# Patient Record
Sex: Female | Born: 1945 | Race: Asian | Hispanic: No | Smoking: Never smoker
Health system: Southern US, Community
[De-identification: ages and names within clinical notes are randomized; demographics above are authoritative.]

## PROBLEM LIST (undated history)

## (undated) DIAGNOSIS — I10 Essential (primary) hypertension: Secondary | ICD-10-CM

## (undated) DIAGNOSIS — E119 Type 2 diabetes mellitus without complications: Secondary | ICD-10-CM

## (undated) DIAGNOSIS — I2510 Atherosclerotic heart disease of native coronary artery without angina pectoris: Secondary | ICD-10-CM

## (undated) HISTORY — PX: CORONARY ANGIOPLASTY WITH STENT PLACEMENT: SHX49

---

## 2019-02-03 ENCOUNTER — Encounter (HOSPITAL_BASED_OUTPATIENT_CLINIC_OR_DEPARTMENT_OTHER): Payer: Self-pay | Admitting: Emergency Medicine

## 2019-02-03 ENCOUNTER — Other Ambulatory Visit: Payer: Self-pay

## 2019-02-03 ENCOUNTER — Inpatient Hospital Stay (HOSPITAL_BASED_OUTPATIENT_CLINIC_OR_DEPARTMENT_OTHER)
Admission: EM | Admit: 2019-02-03 | Discharge: 2019-03-27 | DRG: 004 | Disposition: A | Discharge status: Rehabilitation Facility | Payer: Medicaid Other | Attending: Family Medicine | Admitting: Family Medicine

## 2019-02-03 DIAGNOSIS — Z7902 Long term (current) use of antithrombotics/antiplatelets: Secondary | ICD-10-CM

## 2019-02-03 DIAGNOSIS — R0603 Acute respiratory distress: Secondary | ICD-10-CM

## 2019-02-03 DIAGNOSIS — Z01818 Encounter for other preprocedural examination: Secondary | ICD-10-CM

## 2019-02-03 DIAGNOSIS — Z7189 Other specified counseling: Secondary | ICD-10-CM

## 2019-02-03 DIAGNOSIS — I959 Hypotension, unspecified: Secondary | ICD-10-CM | POA: Diagnosis present

## 2019-02-03 DIAGNOSIS — D6489 Other specified anemias: Secondary | ICD-10-CM | POA: Diagnosis present

## 2019-02-03 DIAGNOSIS — R140 Abdominal distension (gaseous): Secondary | ICD-10-CM

## 2019-02-03 DIAGNOSIS — R0600 Dyspnea, unspecified: Secondary | ICD-10-CM

## 2019-02-03 DIAGNOSIS — G9341 Metabolic encephalopathy: Secondary | ICD-10-CM | POA: Diagnosis present

## 2019-02-03 DIAGNOSIS — E11649 Type 2 diabetes mellitus with hypoglycemia without coma: Secondary | ICD-10-CM | POA: Diagnosis not present

## 2019-02-03 DIAGNOSIS — I252 Old myocardial infarction: Secondary | ICD-10-CM

## 2019-02-03 DIAGNOSIS — J189 Pneumonia, unspecified organism: Secondary | ICD-10-CM

## 2019-02-03 DIAGNOSIS — E875 Hyperkalemia: Secondary | ICD-10-CM | POA: Diagnosis present

## 2019-02-03 DIAGNOSIS — I255 Ischemic cardiomyopathy: Secondary | ICD-10-CM | POA: Diagnosis present

## 2019-02-03 DIAGNOSIS — Y838 Other surgical procedures as the cause of abnormal reaction of the patient, or of later complication, without mention of misadventure at the time of the procedure: Secondary | ICD-10-CM | POA: Diagnosis not present

## 2019-02-03 DIAGNOSIS — Z87898 Personal history of other specified conditions: Secondary | ICD-10-CM

## 2019-02-03 DIAGNOSIS — Z9889 Other specified postprocedural states: Secondary | ICD-10-CM

## 2019-02-03 DIAGNOSIS — R0602 Shortness of breath: Secondary | ICD-10-CM

## 2019-02-03 DIAGNOSIS — E876 Hypokalemia: Secondary | ICD-10-CM | POA: Diagnosis not present

## 2019-02-03 DIAGNOSIS — A400 Sepsis due to streptococcus, group A: Principal | ICD-10-CM | POA: Diagnosis present

## 2019-02-03 DIAGNOSIS — N179 Acute kidney failure, unspecified: Secondary | ICD-10-CM

## 2019-02-03 DIAGNOSIS — J154 Pneumonia due to other streptococci: Secondary | ICD-10-CM | POA: Diagnosis present

## 2019-02-03 DIAGNOSIS — T17908A Unspecified foreign body in respiratory tract, part unspecified causing other injury, initial encounter: Secondary | ICD-10-CM

## 2019-02-03 DIAGNOSIS — J9690 Respiratory failure, unspecified, unspecified whether with hypoxia or hypercapnia: Secondary | ICD-10-CM

## 2019-02-03 DIAGNOSIS — I480 Paroxysmal atrial fibrillation: Secondary | ICD-10-CM | POA: Diagnosis not present

## 2019-02-03 DIAGNOSIS — Z515 Encounter for palliative care: Secondary | ICD-10-CM

## 2019-02-03 DIAGNOSIS — R6510 Systemic inflammatory response syndrome (SIRS) of non-infectious origin without acute organ dysfunction: Secondary | ICD-10-CM

## 2019-02-03 DIAGNOSIS — Z930 Tracheostomy status: Secondary | ICD-10-CM

## 2019-02-03 DIAGNOSIS — Z9289 Personal history of other medical treatment: Secondary | ICD-10-CM

## 2019-02-03 DIAGNOSIS — I214 Non-ST elevation (NSTEMI) myocardial infarction: Secondary | ICD-10-CM | POA: Diagnosis not present

## 2019-02-03 DIAGNOSIS — K567 Ileus, unspecified: Secondary | ICD-10-CM

## 2019-02-03 DIAGNOSIS — R079 Chest pain, unspecified: Secondary | ICD-10-CM

## 2019-02-03 DIAGNOSIS — E1165 Type 2 diabetes mellitus with hyperglycemia: Secondary | ICD-10-CM | POA: Diagnosis present

## 2019-02-03 DIAGNOSIS — J9601 Acute respiratory failure with hypoxia: Secondary | ICD-10-CM

## 2019-02-03 DIAGNOSIS — J811 Chronic pulmonary edema: Secondary | ICD-10-CM

## 2019-02-03 DIAGNOSIS — R6521 Severe sepsis with septic shock: Secondary | ICD-10-CM | POA: Diagnosis present

## 2019-02-03 DIAGNOSIS — Z7982 Long term (current) use of aspirin: Secondary | ICD-10-CM

## 2019-02-03 DIAGNOSIS — Z79899 Other long term (current) drug therapy: Secondary | ICD-10-CM

## 2019-02-03 DIAGNOSIS — Y95 Nosocomial condition: Secondary | ICD-10-CM | POA: Diagnosis present

## 2019-02-03 DIAGNOSIS — E1122 Type 2 diabetes mellitus with diabetic chronic kidney disease: Secondary | ICD-10-CM | POA: Diagnosis present

## 2019-02-03 DIAGNOSIS — J810 Acute pulmonary edema: Secondary | ICD-10-CM

## 2019-02-03 DIAGNOSIS — N183 Chronic kidney disease, stage 3 (moderate): Secondary | ICD-10-CM | POA: Diagnosis present

## 2019-02-03 DIAGNOSIS — R109 Unspecified abdominal pain: Secondary | ICD-10-CM

## 2019-02-03 DIAGNOSIS — F411 Generalized anxiety disorder: Secondary | ICD-10-CM | POA: Diagnosis present

## 2019-02-03 DIAGNOSIS — Z20828 Contact with and (suspected) exposure to other viral communicable diseases: Secondary | ICD-10-CM | POA: Diagnosis present

## 2019-02-03 DIAGNOSIS — I2510 Atherosclerotic heart disease of native coronary artery without angina pectoris: Secondary | ICD-10-CM

## 2019-02-03 DIAGNOSIS — Z978 Presence of other specified devices: Secondary | ICD-10-CM

## 2019-02-03 DIAGNOSIS — Z09 Encounter for follow-up examination after completed treatment for conditions other than malignant neoplasm: Secondary | ICD-10-CM

## 2019-02-03 DIAGNOSIS — J9621 Acute and chronic respiratory failure with hypoxia: Secondary | ICD-10-CM

## 2019-02-03 DIAGNOSIS — K922 Gastrointestinal hemorrhage, unspecified: Secondary | ICD-10-CM | POA: Diagnosis not present

## 2019-02-03 DIAGNOSIS — I130 Hypertensive heart and chronic kidney disease with heart failure and stage 1 through stage 4 chronic kidney disease, or unspecified chronic kidney disease: Secondary | ICD-10-CM | POA: Diagnosis present

## 2019-02-03 DIAGNOSIS — D638 Anemia in other chronic diseases classified elsewhere: Secondary | ICD-10-CM | POA: Diagnosis present

## 2019-02-03 DIAGNOSIS — A419 Sepsis, unspecified organism: Secondary | ICD-10-CM

## 2019-02-03 DIAGNOSIS — J9600 Acute respiratory failure, unspecified whether with hypoxia or hypercapnia: Secondary | ICD-10-CM

## 2019-02-03 DIAGNOSIS — Z781 Physical restraint status: Secondary | ICD-10-CM

## 2019-02-03 DIAGNOSIS — G4700 Insomnia, unspecified: Secondary | ICD-10-CM | POA: Diagnosis present

## 2019-02-03 DIAGNOSIS — Z955 Presence of coronary angioplasty implant and graft: Secondary | ICD-10-CM

## 2019-02-03 DIAGNOSIS — Z794 Long term (current) use of insulin: Secondary | ICD-10-CM

## 2019-02-03 DIAGNOSIS — Z4659 Encounter for fitting and adjustment of other gastrointestinal appliance and device: Secondary | ICD-10-CM

## 2019-02-03 DIAGNOSIS — R4702 Dysphasia: Secondary | ICD-10-CM | POA: Diagnosis not present

## 2019-02-03 DIAGNOSIS — T82838A Hemorrhage of vascular prosthetic devices, implants and grafts, initial encounter: Secondary | ICD-10-CM | POA: Diagnosis not present

## 2019-02-03 DIAGNOSIS — E870 Hyperosmolality and hypernatremia: Secondary | ICD-10-CM | POA: Diagnosis not present

## 2019-02-03 DIAGNOSIS — E873 Alkalosis: Secondary | ICD-10-CM | POA: Diagnosis not present

## 2019-02-03 DIAGNOSIS — B349 Viral infection, unspecified: Secondary | ICD-10-CM | POA: Diagnosis present

## 2019-02-03 DIAGNOSIS — I5043 Acute on chronic combined systolic (congestive) and diastolic (congestive) heart failure: Secondary | ICD-10-CM

## 2019-02-03 DIAGNOSIS — E785 Hyperlipidemia, unspecified: Secondary | ICD-10-CM | POA: Diagnosis present

## 2019-02-03 DIAGNOSIS — D62 Acute posthemorrhagic anemia: Secondary | ICD-10-CM | POA: Diagnosis not present

## 2019-02-03 DIAGNOSIS — Z0189 Encounter for other specified special examinations: Secondary | ICD-10-CM

## 2019-02-03 HISTORY — DX: Essential (primary) hypertension: I10

## 2019-02-03 HISTORY — DX: Atherosclerotic heart disease of native coronary artery without angina pectoris: I25.10

## 2019-02-03 HISTORY — DX: Type 2 diabetes mellitus without complications: E11.9

## 2019-02-03 MED ORDER — ONDANSETRON HCL 4 MG/2ML IJ SOLN
4.0000 mg | Freq: Once | INTRAMUSCULAR | Status: AC
  Administered 2019-02-04: 4 mg via INTRAVENOUS
  Filled 2019-02-03: qty 2

## 2019-02-03 MED ORDER — SODIUM CHLORIDE 0.9 % IV BOLUS
1000.0000 mL | Freq: Once | INTRAVENOUS | Status: AC
  Administered 2019-02-03: 1000 mL via INTRAVENOUS

## 2019-02-03 MED ORDER — ACETAMINOPHEN 325 MG PO TABS
650.0000 mg | ORAL_TABLET | Freq: Once | ORAL | Status: AC
  Administered 2019-02-03: 650 mg via ORAL
  Filled 2019-02-03: qty 2

## 2019-02-03 NOTE — ED Provider Notes (Signed)
[facility]-EMERGENCY DEPT [facility] Provider Note: Lowella Dell, MD, FACEP  CSN: [id number] MRN: [id number] ARRIVAL: 02/03/19 at 2252 ROOM: MHOTF/OTF   CHIEF COMPLAINT  Fever   HISTORY OF PRESENT ILLNESS  02/03/19 11:14 PM Gabrielle Wright is a 73 y.o. female visiting here from [place].  Today she developed fever, body aches, low back pain and confusion (such as talking about her deceased husband as if he were present).  She was seen by a physician this afternoon who diagnosed her with a urinary tract infection but she was unable to get her prescription filled.  She denies abdominal pain, flank pain or dysuria.  Fever was noted to be 101.9 on arrival.  She did take Tylenol earlier for her symptoms.  Her son-in-law is interpreting for her.   Past Medical History:  Diagnosis Date  . Coronary artery disease   . Diabetes mellitus without complication (HCC)   . Hypertension     Past Surgical History:  Procedure Laterality Date  . CORONARY ANGIOPLASTY WITH STENT PLACEMENT      No family history on file.  Social History   Tobacco Use  . Smoking status: Never Smoker  . Smokeless tobacco: Never Used  Substance Use Topics  . Alcohol use: Never    Frequency: Never  . Drug use: Never    Prior to Admission medications   Not on File    Allergies Patient has no known allergies.   REVIEW OF SYSTEMS  Negative except as noted here or in the History of Present Illness.   PHYSICAL EXAMINATION  Initial Vital Signs Blood pressure (!) 128/50, pulse (!) 102, temperature (!) 101.9 F (38.8 C), temperature source Oral, resp. rate (!) 22, height 5\' 4"  (1.626 m), SpO2 96 %.  Examination General: Well-developed, well-nourished female in no acute distress; appearance consistent with age of record HENT: normocephalic; atraumatic Eyes: pupils equal, round and reactive to light; extraocular muscles intact Neck: supple; no meningeal signs Heart: regular rate and rhythm; harsh systolic murmur at  left upper sternal border Lungs: clear to auscultation bilaterally Abdomen: soft; nondistended; nontender; bowel sounds present GU: No CVA tenderness Extremities: No deformity; full range of motion; pulses normal Neurologic: Awake, alert; motor function intact in all extremities and symmetric; no facial droop Skin: Warm and dry Psychiatric: Flat affect   RESULTS  Summary of this visit's results, reviewed by myself:   EKG Interpretation  Date/Time:  Tuesday February 04 2019 04:49:55 EDT Ventricular Rate:  87 PR Interval:    QRS Duration: 74 QT Interval:  376 QTC Calculation: 453 R Axis:   38 Text Interpretation:  Sinus rhythm Probable left atrial enlargement Anterior infarct, old No previous ECGs available Confirmed by Paula Libra ([id number]) on 02/04/2019 4:55:54 AM      Laboratory Studies: Results for orders placed or performed during the hospital encounter of 02/03/19 (from the past 24 hour(s))  Comprehensive metabolic panel     Status: Abnormal   Collection Time: 02/03/19 11:54 PM  Result Value Ref Range   Sodium 131 (L) 135 - 145 mmol/L   Potassium 5.2 (H) 3.5 - 5.1 mmol/L   Chloride 100 98 - 111 mmol/L   CO2 22 22 - 32 mmol/L   Glucose, Bld 223 (H) 70 - 99 mg/dL   BUN 33 (H) 8 - 23 mg/dL   Creatinine, Ser 6.21 (H) 0.44 - 1.00 mg/dL   Calcium 9.1 8.9 - 30.8 mg/dL   Total Protein 7.6 6.5 - 8.1 g/dL   Albumin 4.0 3.5 -  5.0 g/dL   AST 29 15 - 41 U/L   ALT 16 0 - 44 U/L   Alkaline Phosphatase 66 38 - 126 U/L   Total Bilirubin 0.5 0.3 - 1.2 mg/dL   GFR calc non Af Amer 31 (L) >60 mL/min   GFR calc Af Amer 36 (L) >60 mL/min   Anion gap 9 5 - 15  Lactic acid, plasma     Status: None   Collection Time: 02/03/19 11:54 PM  Result Value Ref Range   Lactic Acid, Venous 1.9 0.5 - 1.9 mmol/L  CBC with Differential     Status: Abnormal   Collection Time: 02/03/19 11:54 PM  Result Value Ref Range   WBC 10.3 4.0 - 10.5 K/uL   RBC 4.80 3.87 - 5.11 MIL/uL   Hemoglobin 11.6 (L) 12.0  - 15.0 g/dL   HCT 52.8 41.3 - 24.4 %   MCV 81.0 80.0 - 100.0 fL   MCH 24.2 (L) 26.0 - 34.0 pg   MCHC 29.8 (L) 30.0 - 36.0 g/dL   RDW 01.0 (H) 27.2 - 53.6 %   Platelets 187 150 - 400 K/uL   nRBC 0.0 0.0 - 0.2 %   Neutrophils Relative % 92 %   Neutro Abs 9.5 (H) 1.7 - 7.7 K/uL   Lymphocytes Relative 6 %   Lymphs Abs 0.6 (L) 0.7 - 4.0 K/uL   Monocytes Relative 1 %   Monocytes Absolute 0.1 0.1 - 1.0 K/uL   Eosinophils Relative 1 %   Eosinophils Absolute 0.1 0.0 - 0.5 K/uL   Basophils Relative 0 %   Basophils Absolute 0.0 0.0 - 0.1 K/uL   Immature Granulocytes 0 %   Abs Immature Granulocytes 0.03 0.00 - 0.07 K/uL  Urinalysis, Routine w reflex microscopic     Status: None   Collection Time: 02/04/19  1:23 AM  Result Value Ref Range   Color, Urine YELLOW YELLOW   APPearance CLEAR CLEAR   Specific Gravity, Urine 1.015 1.005 - 1.030   pH 5.5 5.0 - 8.0   Glucose, UA NEGATIVE NEGATIVE mg/dL   Hgb urine dipstick NEGATIVE NEGATIVE   Bilirubin Urine NEGATIVE NEGATIVE   Ketones, ur NEGATIVE NEGATIVE mg/dL   Protein, ur NEGATIVE NEGATIVE mg/dL   Nitrite NEGATIVE NEGATIVE   Leukocytes,Ua NEGATIVE NEGATIVE  Lactic acid, plasma     Status: None   Collection Time: 02/04/19  2:54 AM  Result Value Ref Range   Lactic Acid, Venous 1.7 0.5 - 1.9 mmol/L  ` Imaging Studies: Dg Chest 2 View  Result Date: 02/04/2019 CLINICAL DATA:  Mid chest pain today.  Fever and confusion. EXAM: CHEST - 2 VIEW COMPARISON:  01/31/2019 FINDINGS: The heart size and mediastinal contours are within normal limits. Both lungs are clear. The visualized skeletal structures are unremarkable. Calcification of the aorta. IMPRESSION: No active cardiopulmonary disease. Electronically Signed   By: Burman Nieves M.D.   On: 02/04/2019 02:26    ED COURSE and MDM  Nursing notes and initial vitals signs, including pulse oximetry, reviewed.  Vitals:   02/04/19 0520 02/04/19 0525 02/04/19 0530 02/04/19 0535  BP: (!) 88/52 (!)  93/57 (!) 99/56 101/62  Pulse: 84 84 85 88  Resp: (!) 27 (!) 25 (!) 21 (!) 21  Temp:      TempSrc:      SpO2: 100% 100% 99% 100%  Weight:      Height:       1:53 AM Patient finally provided a urine specimen.  There  is no evidence of urinary tract infection.  Her low lymphocyte count suggest a viral etiology.  Chest x-ray has been ordered although the patient has had no respiratory complaints.  3:02 AM Patient's blood pressure has become soft, third liter of normal saline fluid bolus ordered.  We will start antibiotics (Zosyn and vancomycin) for presumed sepsis without an obvious source.   4:17 AM SBP in 70s and 80s.  Levophed infusion initiated.  4:56 AM Dr. Dellie Catholic accepts for transfer to [facility], ICU critical care service.  PROCEDURES   CRITICAL CARE Performed by: Carlisle Beers Jadon Ressler Total critical care time: 75 minutes Critical care time was exclusive of separately billable procedures and treating other patients. Critical care was necessary to treat or prevent imminent or life-threatening deterioration. Critical care was time spent personally by me on the following activities: development of treatment plan with patient and/or surrogate as well as nursing, discussions with consultants, evaluation of patient's response to treatment, examination of patient, obtaining history from patient or surrogate, ordering and performing treatments and interventions, ordering and review of laboratory studies, ordering and review of radiographic studies, pulse oximetry and re-evaluation of patient's condition.   ED DIAGNOSES     ICD-10-CM   1. SIRS (systemic inflammatory response syndrome) (HCC) R65.10   2. AKI (acute kidney injury) (HCC) N17.9   3. Septic shock (HCC) A41.9    R65.21        Paula Libra, MD 02/04/19 [phone]

## 2019-02-03 NOTE — ED Triage Notes (Signed)
Pt brought to ED by [facility] from home for a c/o UTI, fever and some confusion, pt was diagnose with a UTI today by PCP unable to get abx. BP 160/58, HR 100 CBG 181.

## 2019-02-04 ENCOUNTER — Inpatient Hospital Stay (HOSPITAL_COMMUNITY): Payer: Medicaid Other

## 2019-02-04 ENCOUNTER — Emergency Department (HOSPITAL_BASED_OUTPATIENT_CLINIC_OR_DEPARTMENT_OTHER): Payer: Medicaid Other

## 2019-02-04 DIAGNOSIS — I480 Paroxysmal atrial fibrillation: Secondary | ICD-10-CM | POA: Diagnosis not present

## 2019-02-04 DIAGNOSIS — J9621 Acute and chronic respiratory failure with hypoxia: Secondary | ICD-10-CM | POA: Diagnosis present

## 2019-02-04 DIAGNOSIS — A419 Sepsis, unspecified organism: Secondary | ICD-10-CM | POA: Diagnosis present

## 2019-02-04 DIAGNOSIS — Z930 Tracheostomy status: Secondary | ICD-10-CM | POA: Diagnosis not present

## 2019-02-04 DIAGNOSIS — G9341 Metabolic encephalopathy: Secondary | ICD-10-CM | POA: Diagnosis present

## 2019-02-04 DIAGNOSIS — R1013 Epigastric pain: Secondary | ICD-10-CM | POA: Diagnosis not present

## 2019-02-04 DIAGNOSIS — E870 Hyperosmolality and hypernatremia: Secondary | ICD-10-CM | POA: Diagnosis not present

## 2019-02-04 DIAGNOSIS — I255 Ischemic cardiomyopathy: Secondary | ICD-10-CM | POA: Diagnosis not present

## 2019-02-04 DIAGNOSIS — J810 Acute pulmonary edema: Secondary | ICD-10-CM | POA: Diagnosis not present

## 2019-02-04 DIAGNOSIS — I959 Hypotension, unspecified: Secondary | ICD-10-CM

## 2019-02-04 DIAGNOSIS — I130 Hypertensive heart and chronic kidney disease with heart failure and stage 1 through stage 4 chronic kidney disease, or unspecified chronic kidney disease: Secondary | ICD-10-CM | POA: Diagnosis not present

## 2019-02-04 DIAGNOSIS — R579 Shock, unspecified: Secondary | ICD-10-CM | POA: Diagnosis not present

## 2019-02-04 DIAGNOSIS — F411 Generalized anxiety disorder: Secondary | ICD-10-CM | POA: Diagnosis not present

## 2019-02-04 DIAGNOSIS — Z9911 Dependence on respirator [ventilator] status: Secondary | ICD-10-CM | POA: Diagnosis not present

## 2019-02-04 DIAGNOSIS — I5042 Chronic combined systolic (congestive) and diastolic (congestive) heart failure: Secondary | ICD-10-CM | POA: Diagnosis not present

## 2019-02-04 DIAGNOSIS — I5043 Acute on chronic combined systolic (congestive) and diastolic (congestive) heart failure: Secondary | ICD-10-CM | POA: Diagnosis not present

## 2019-02-04 DIAGNOSIS — D649 Anemia, unspecified: Secondary | ICD-10-CM | POA: Diagnosis not present

## 2019-02-04 DIAGNOSIS — I351 Nonrheumatic aortic (valve) insufficiency: Secondary | ICD-10-CM | POA: Diagnosis not present

## 2019-02-04 DIAGNOSIS — G9340 Encephalopathy, unspecified: Secondary | ICD-10-CM | POA: Diagnosis not present

## 2019-02-04 DIAGNOSIS — K567 Ileus, unspecified: Secondary | ICD-10-CM | POA: Diagnosis not present

## 2019-02-04 DIAGNOSIS — R1310 Dysphagia, unspecified: Secondary | ICD-10-CM | POA: Diagnosis not present

## 2019-02-04 DIAGNOSIS — R6521 Severe sepsis with septic shock: Secondary | ICD-10-CM

## 2019-02-04 DIAGNOSIS — A400 Sepsis due to streptococcus, group A: Secondary | ICD-10-CM | POA: Diagnosis present

## 2019-02-04 DIAGNOSIS — I2510 Atherosclerotic heart disease of native coronary artery without angina pectoris: Secondary | ICD-10-CM | POA: Diagnosis not present

## 2019-02-04 DIAGNOSIS — E1165 Type 2 diabetes mellitus with hyperglycemia: Secondary | ICD-10-CM | POA: Diagnosis present

## 2019-02-04 DIAGNOSIS — Z7189 Other specified counseling: Secondary | ICD-10-CM | POA: Diagnosis not present

## 2019-02-04 DIAGNOSIS — I482 Chronic atrial fibrillation, unspecified: Secondary | ICD-10-CM | POA: Diagnosis not present

## 2019-02-04 DIAGNOSIS — I5023 Acute on chronic systolic (congestive) heart failure: Secondary | ICD-10-CM | POA: Diagnosis not present

## 2019-02-04 DIAGNOSIS — Z79899 Other long term (current) drug therapy: Secondary | ICD-10-CM | POA: Diagnosis not present

## 2019-02-04 DIAGNOSIS — B950 Streptococcus, group A, as the cause of diseases classified elsewhere: Secondary | ICD-10-CM | POA: Diagnosis not present

## 2019-02-04 DIAGNOSIS — E669 Obesity, unspecified: Secondary | ICD-10-CM | POA: Diagnosis not present

## 2019-02-04 DIAGNOSIS — N179 Acute kidney failure, unspecified: Secondary | ICD-10-CM | POA: Diagnosis present

## 2019-02-04 DIAGNOSIS — T82838A Hemorrhage of vascular prosthetic devices, implants and grafts, initial encounter: Secondary | ICD-10-CM | POA: Diagnosis not present

## 2019-02-04 DIAGNOSIS — Z20828 Contact with and (suspected) exposure to other viral communicable diseases: Secondary | ICD-10-CM | POA: Diagnosis present

## 2019-02-04 DIAGNOSIS — J440 Chronic obstructive pulmonary disease with acute lower respiratory infection: Secondary | ICD-10-CM

## 2019-02-04 DIAGNOSIS — I10 Essential (primary) hypertension: Secondary | ICD-10-CM | POA: Diagnosis not present

## 2019-02-04 DIAGNOSIS — Z955 Presence of coronary angioplasty implant and graft: Secondary | ICD-10-CM | POA: Diagnosis not present

## 2019-02-04 DIAGNOSIS — R6520 Severe sepsis without septic shock: Secondary | ICD-10-CM | POA: Diagnosis not present

## 2019-02-04 DIAGNOSIS — E1169 Type 2 diabetes mellitus with other specified complication: Secondary | ICD-10-CM | POA: Diagnosis not present

## 2019-02-04 DIAGNOSIS — B348 Other viral infections of unspecified site: Secondary | ICD-10-CM | POA: Diagnosis not present

## 2019-02-04 DIAGNOSIS — J9690 Respiratory failure, unspecified, unspecified whether with hypoxia or hypercapnia: Secondary | ICD-10-CM | POA: Diagnosis not present

## 2019-02-04 DIAGNOSIS — R9431 Abnormal electrocardiogram [ECG] [EKG]: Secondary | ICD-10-CM | POA: Diagnosis not present

## 2019-02-04 DIAGNOSIS — J209 Acute bronchitis, unspecified: Secondary | ICD-10-CM

## 2019-02-04 DIAGNOSIS — I214 Non-ST elevation (NSTEMI) myocardial infarction: Secondary | ICD-10-CM | POA: Diagnosis not present

## 2019-02-04 DIAGNOSIS — R1312 Dysphagia, oropharyngeal phase: Secondary | ICD-10-CM | POA: Diagnosis not present

## 2019-02-04 DIAGNOSIS — Y838 Other surgical procedures as the cause of abnormal reaction of the patient, or of later complication, without mention of misadventure at the time of the procedure: Secondary | ICD-10-CM | POA: Diagnosis not present

## 2019-02-04 DIAGNOSIS — E1122 Type 2 diabetes mellitus with diabetic chronic kidney disease: Secondary | ICD-10-CM | POA: Diagnosis not present

## 2019-02-04 DIAGNOSIS — Z9289 Personal history of other medical treatment: Secondary | ICD-10-CM | POA: Diagnosis not present

## 2019-02-04 DIAGNOSIS — I25110 Atherosclerotic heart disease of native coronary artery with unstable angina pectoris: Secondary | ICD-10-CM | POA: Diagnosis not present

## 2019-02-04 DIAGNOSIS — J9611 Chronic respiratory failure with hypoxia: Secondary | ICD-10-CM | POA: Diagnosis not present

## 2019-02-04 DIAGNOSIS — E875 Hyperkalemia: Secondary | ICD-10-CM | POA: Diagnosis present

## 2019-02-04 DIAGNOSIS — J189 Pneumonia, unspecified organism: Secondary | ICD-10-CM | POA: Diagnosis not present

## 2019-02-04 DIAGNOSIS — J9601 Acute respiratory failure with hypoxia: Secondary | ICD-10-CM | POA: Diagnosis not present

## 2019-02-04 DIAGNOSIS — R140 Abdominal distension (gaseous): Secondary | ICD-10-CM | POA: Diagnosis not present

## 2019-02-04 DIAGNOSIS — Z781 Physical restraint status: Secondary | ICD-10-CM | POA: Diagnosis not present

## 2019-02-04 DIAGNOSIS — I4891 Unspecified atrial fibrillation: Secondary | ICD-10-CM | POA: Diagnosis not present

## 2019-02-04 DIAGNOSIS — K219 Gastro-esophageal reflux disease without esophagitis: Secondary | ICD-10-CM | POA: Diagnosis not present

## 2019-02-04 DIAGNOSIS — J154 Pneumonia due to other streptococci: Secondary | ICD-10-CM | POA: Diagnosis present

## 2019-02-04 DIAGNOSIS — R7881 Bacteremia: Secondary | ICD-10-CM | POA: Diagnosis not present

## 2019-02-04 DIAGNOSIS — D62 Acute posthemorrhagic anemia: Secondary | ICD-10-CM | POA: Diagnosis not present

## 2019-02-04 DIAGNOSIS — I25118 Atherosclerotic heart disease of native coronary artery with other forms of angina pectoris: Secondary | ICD-10-CM | POA: Diagnosis not present

## 2019-02-04 DIAGNOSIS — N183 Chronic kidney disease, stage 3 (moderate): Secondary | ICD-10-CM | POA: Diagnosis not present

## 2019-02-04 DIAGNOSIS — R5381 Other malaise: Secondary | ICD-10-CM | POA: Diagnosis not present

## 2019-02-04 DIAGNOSIS — F419 Anxiety disorder, unspecified: Secondary | ICD-10-CM | POA: Diagnosis not present

## 2019-02-04 DIAGNOSIS — Y95 Nosocomial condition: Secondary | ICD-10-CM | POA: Diagnosis present

## 2019-02-04 DIAGNOSIS — K922 Gastrointestinal hemorrhage, unspecified: Secondary | ICD-10-CM | POA: Diagnosis not present

## 2019-02-04 DIAGNOSIS — E873 Alkalosis: Secondary | ICD-10-CM | POA: Diagnosis not present

## 2019-02-04 DIAGNOSIS — Z515 Encounter for palliative care: Secondary | ICD-10-CM | POA: Diagnosis not present

## 2019-02-04 DIAGNOSIS — E785 Hyperlipidemia, unspecified: Secondary | ICD-10-CM | POA: Diagnosis not present

## 2019-02-04 DIAGNOSIS — R6510 Systemic inflammatory response syndrome (SIRS) of non-infectious origin without acute organ dysfunction: Secondary | ICD-10-CM | POA: Diagnosis present

## 2019-02-04 LAB — COMPREHENSIVE METABOLIC PANEL
ALT: 16 U/L (ref 0–44)
ALT: 17 U/L (ref 0–44)
AST: 29 U/L (ref 15–41)
AST: 33 U/L (ref 15–41)
Albumin: 2.8 g/dL — ABNORMAL LOW (ref 3.5–5.0)
Albumin: 4 g/dL (ref 3.5–5.0)
Alkaline Phosphatase: 53 U/L (ref 38–126)
Alkaline Phosphatase: 66 U/L (ref 38–126)
Anion gap: 7 (ref 5–15)
Anion gap: 9 (ref 5–15)
BUN: 29 mg/dL — ABNORMAL HIGH (ref 8–23)
BUN: 33 mg/dL — AB (ref 8–23)
CHLORIDE: 106 mmol/L (ref 98–111)
CO2: 18 mmol/L — ABNORMAL LOW (ref 22–32)
CO2: 22 mmol/L (ref 22–32)
Calcium: 7 mg/dL — ABNORMAL LOW (ref 8.9–10.3)
Calcium: 9.1 mg/dL (ref 8.9–10.3)
Chloride: 100 mmol/L (ref 98–111)
Creatinine, Ser: 1.62 mg/dL — ABNORMAL HIGH (ref 0.44–1.00)
Creatinine, Ser: 1.75 mg/dL — ABNORMAL HIGH (ref 0.44–1.00)
GFR calc Af Amer: 33 mL/min — ABNORMAL LOW (ref >60)
GFR calc Af Amer: 36 mL/min — ABNORMAL LOW (ref >60)
GFR calc non Af Amer: 29 mL/min — ABNORMAL LOW (ref >60)
GFR calc non Af Amer: 31 mL/min — ABNORMAL LOW (ref >60)
Glucose, Bld: 223 mg/dL — ABNORMAL HIGH (ref 70–99)
Glucose, Bld: 401 mg/dL — ABNORMAL HIGH (ref 70–99)
Potassium: 5.2 mmol/L — ABNORMAL HIGH (ref 3.5–5.1)
Potassium: 7.1 mmol/L (ref 3.5–5.1)
Sodium: 131 mmol/L — ABNORMAL LOW (ref 135–145)
Sodium: 131 mmol/L — ABNORMAL LOW (ref 135–145)
Total Bilirubin: 0.5 mg/dL (ref 0.3–1.2)
Total Bilirubin: 1.2 mg/dL (ref 0.3–1.2)
Total Protein: 5.8 g/dL — ABNORMAL LOW (ref 6.5–8.1)
Total Protein: 7.6 g/dL (ref 6.5–8.1)

## 2019-02-04 LAB — PROTIME-INR
INR: 1.3 — ABNORMAL HIGH (ref 0.8–1.2)
PROTHROMBIN TIME: 16.2 s — AB (ref 11.4–15.2)

## 2019-02-04 LAB — BASIC METABOLIC PANEL
Anion gap: 5 (ref 5–15)
BUN: 30 mg/dL — ABNORMAL HIGH (ref 8–23)
CO2: 18 mmol/L — ABNORMAL LOW (ref 22–32)
Calcium: 6.9 mg/dL — ABNORMAL LOW (ref 8.9–10.3)
Chloride: 110 mmol/L (ref 98–111)
Creatinine, Ser: 1.77 mg/dL — ABNORMAL HIGH (ref 0.44–1.00)
GFR calc Af Amer: 33 mL/min — ABNORMAL LOW (ref >60)
GFR calc non Af Amer: 28 mL/min — ABNORMAL LOW (ref >60)
Glucose, Bld: 429 mg/dL — ABNORMAL HIGH (ref 70–99)
Potassium: 6.8 mmol/L (ref 3.5–5.1)
SODIUM: 133 mmol/L — AB (ref 135–145)

## 2019-02-04 LAB — POCT I-STAT 7, (LYTES, BLD GAS, ICA,H+H)
Acid-base deficit: 7 mmol/L — ABNORMAL HIGH (ref 0.0–2.0)
Bicarbonate: 18.6 mmol/L — ABNORMAL LOW (ref 20.0–28.0)
Calcium, Ion: 1.01 mmol/L — ABNORMAL LOW (ref 1.15–1.40)
HCT: 33 % — ABNORMAL LOW (ref 36.0–46.0)
Hemoglobin: 11.2 g/dL — ABNORMAL LOW (ref 12.0–15.0)
O2 Saturation: 100 %
Patient temperature: 97.9
Potassium: 7 mmol/L (ref 3.5–5.1)
Sodium: 131 mmol/L — ABNORMAL LOW (ref 135–145)
TCO2: 20 mmol/L — ABNORMAL LOW (ref 22–32)
pCO2 arterial: 36.2 mmHg (ref 32.0–48.0)
pH, Arterial: 7.317 — ABNORMAL LOW (ref 7.350–7.450)
pO2, Arterial: 431 mmHg — ABNORMAL HIGH (ref 83.0–108.0)

## 2019-02-04 LAB — URINALYSIS, ROUTINE W REFLEX MICROSCOPIC
Bilirubin Urine: NEGATIVE
Glucose, UA: NEGATIVE mg/dL
Hgb urine dipstick: NEGATIVE
Ketones, ur: NEGATIVE mg/dL
Leukocytes,Ua: NEGATIVE
Nitrite: NEGATIVE
PH: 5.5 (ref 5.0–8.0)
Protein, ur: NEGATIVE mg/dL
SPECIFIC GRAVITY, URINE: 1.015 (ref 1.005–1.030)

## 2019-02-04 LAB — BLOOD CULTURE ID PANEL (REFLEXED)
Acinetobacter baumannii: NOT DETECTED
CANDIDA KRUSEI: NOT DETECTED
Candida albicans: NOT DETECTED
Candida glabrata: NOT DETECTED
Candida parapsilosis: NOT DETECTED
Candida tropicalis: NOT DETECTED
Enterobacter cloacae complex: NOT DETECTED
Enterobacteriaceae species: NOT DETECTED
Enterococcus species: NOT DETECTED
Escherichia coli: NOT DETECTED
Haemophilus influenzae: NOT DETECTED
Klebsiella oxytoca: NOT DETECTED
Klebsiella pneumoniae: NOT DETECTED
Listeria monocytogenes: NOT DETECTED
Neisseria meningitidis: NOT DETECTED
PSEUDOMONAS AERUGINOSA: NOT DETECTED
Proteus species: NOT DETECTED
STREPTOCOCCUS PNEUMONIAE: NOT DETECTED
Serratia marcescens: NOT DETECTED
Staphylococcus aureus (BCID): NOT DETECTED
Staphylococcus species: NOT DETECTED
Streptococcus agalactiae: NOT DETECTED
Streptococcus pyogenes: DETECTED — AB
Streptococcus species: DETECTED — AB

## 2019-02-04 LAB — CBC WITH DIFFERENTIAL/PLATELET
Abs Immature Granulocytes: 0.03 10*3/uL (ref 0.00–0.07)
Basophils Absolute: 0 10*3/uL (ref 0.0–0.1)
Basophils Relative: 0 %
Eosinophils Absolute: 0.1 10*3/uL (ref 0.0–0.5)
Eosinophils Relative: 1 %
HCT: 38.9 % (ref 36.0–46.0)
Hemoglobin: 11.6 g/dL — ABNORMAL LOW (ref 12.0–15.0)
Immature Granulocytes: 0 %
Lymphocytes Relative: 6 %
Lymphs Abs: 0.6 10*3/uL — ABNORMAL LOW (ref 0.7–4.0)
MCH: 24.2 pg — ABNORMAL LOW (ref 26.0–34.0)
MCHC: 29.8 g/dL — ABNORMAL LOW (ref 30.0–36.0)
MCV: 81 fL (ref 80.0–100.0)
MONO ABS: 0.1 10*3/uL (ref 0.1–1.0)
Monocytes Relative: 1 %
Neutro Abs: 9.5 10*3/uL — ABNORMAL HIGH (ref 1.7–7.7)
Neutrophils Relative %: 92 %
PLATELETS: 187 10*3/uL (ref 150–400)
RBC: 4.8 MIL/uL (ref 3.87–5.11)
RDW: 17.2 % — ABNORMAL HIGH (ref 11.5–15.5)
WBC: 10.3 10*3/uL (ref 4.0–10.5)
nRBC: 0 % (ref 0.0–0.2)

## 2019-02-04 LAB — MAGNESIUM
Magnesium: 1.2 mg/dL — ABNORMAL LOW (ref 1.7–2.4)
Magnesium: 1.6 mg/dL — ABNORMAL LOW (ref 1.7–2.4)

## 2019-02-04 LAB — RESPIRATORY PANEL BY PCR
ADENOVIRUS-RVPPCR: NOT DETECTED
Bordetella pertussis: NOT DETECTED
CORONAVIRUS 229E-RVPPCR: NOT DETECTED
Chlamydophila pneumoniae: NOT DETECTED
Coronavirus HKU1: NOT DETECTED
Coronavirus NL63: NOT DETECTED
Coronavirus OC43: NOT DETECTED
Influenza A: NOT DETECTED
Influenza B: NOT DETECTED
Metapneumovirus: NOT DETECTED
Mycoplasma pneumoniae: NOT DETECTED
Parainfluenza Virus 1: NOT DETECTED
Parainfluenza Virus 2: NOT DETECTED
Parainfluenza Virus 3: NOT DETECTED
Parainfluenza Virus 4: NOT DETECTED
Respiratory Syncytial Virus: NOT DETECTED
Rhinovirus / Enterovirus: DETECTED — AB

## 2019-02-04 LAB — GLUCOSE, CAPILLARY
GLUCOSE-CAPILLARY: 401 mg/dL — AB (ref 70–99)
GLUCOSE-CAPILLARY: 460 mg/dL — AB (ref 70–99)
Glucose-Capillary: 310 mg/dL — ABNORMAL HIGH (ref 70–99)
Glucose-Capillary: 432 mg/dL — ABNORMAL HIGH (ref 70–99)
Glucose-Capillary: 335 mg/dL — ABNORMAL HIGH (ref 70–99)
Glucose-Capillary: 334 mg/dL — ABNORMAL HIGH (ref 70–99)
Glucose-Capillary: 321 mg/dL — ABNORMAL HIGH (ref 70–99)
Glucose-Capillary: 283 mg/dL — ABNORMAL HIGH (ref 70–99)
Glucose-Capillary: 279 mg/dL — ABNORMAL HIGH (ref 70–99)
Glucose-Capillary: 224 mg/dL — ABNORMAL HIGH (ref 70–99)

## 2019-02-04 LAB — LACTIC ACID, PLASMA
Lactic Acid, Venous: 1.7 mmol/L (ref 0.5–1.9)
Lactic Acid, Venous: 1.9 mmol/L (ref 0.5–1.9)

## 2019-02-04 LAB — HEMOGLOBIN A1C
Hgb A1c MFr Bld: 10.1 % — ABNORMAL HIGH (ref 4.8–5.6)
Mean Plasma Glucose: 243.17 mg/dL

## 2019-02-04 LAB — BRAIN NATRIURETIC PEPTIDE: B Natriuretic Peptide: 599.1 pg/mL — ABNORMAL HIGH (ref 0.0–100.0)

## 2019-02-04 LAB — PHOSPHORUS
PHOSPHORUS: 2.8 mg/dL (ref 2.5–4.6)
Phosphorus: 3.4 mg/dL (ref 2.5–4.6)

## 2019-02-04 LAB — CBC
HCT: 36.1 % (ref 36.0–46.0)
Hemoglobin: 10.7 g/dL — ABNORMAL LOW (ref 12.0–15.0)
MCH: 24.3 pg — ABNORMAL LOW (ref 26.0–34.0)
MCHC: 29.6 g/dL — ABNORMAL LOW (ref 30.0–36.0)
MCV: 82 fL (ref 80.0–100.0)
PLATELETS: 236 10*3/uL (ref 150–400)
RBC: 4.4 MIL/uL (ref 3.87–5.11)
RDW: 17.4 % — ABNORMAL HIGH (ref 11.5–15.5)
WBC: 46.3 10*3/uL — ABNORMAL HIGH (ref 4.0–10.5)
nRBC: 0 % (ref 0.0–0.2)

## 2019-02-04 LAB — INFLUENZA PANEL BY PCR (TYPE A & B)
Influenza A By PCR: NEGATIVE
Influenza B By PCR: NEGATIVE

## 2019-02-04 LAB — PROCALCITONIN: PROCALCITONIN: 20.03 ng/mL

## 2019-02-04 LAB — APTT: aPTT: 29 seconds (ref 24–36)

## 2019-02-04 LAB — MRSA PCR SCREENING: MRSA BY PCR: NEGATIVE

## 2019-02-04 LAB — CORTISOL: Cortisol, Plasma: 18.3 ug/dL

## 2019-02-04 MED ORDER — FENTANYL BOLUS VIA INFUSION
25.0000 ug | INTRAVENOUS | Status: DC | PRN
  Administered 2019-02-06 – 2019-02-09 (×7): 25 ug via INTRAVENOUS
  Filled 2019-02-04: qty 25

## 2019-02-04 MED ORDER — NOREPINEPHRINE 4 MG/250ML-% IV SOLN
0.0000 ug/min | INTRAVENOUS | Status: DC
  Administered 2019-02-04: 15 ug/min via INTRAVENOUS
  Administered 2019-02-05: 3 ug/min via INTRAVENOUS
  Administered 2019-02-05: 6 ug/min via INTRAVENOUS
  Administered 2019-02-06: 2 ug/min via INTRAVENOUS
  Administered 2019-02-09: 20 ug/min via INTRAVENOUS
  Administered 2019-02-09: 5 ug/min via INTRAVENOUS
  Filled 2019-02-04 (×5): qty 250

## 2019-02-04 MED ORDER — SODIUM CHLORIDE 0.9 % IV BOLUS
1000.0000 mL | Freq: Once | INTRAVENOUS | Status: AC
  Administered 2019-02-04: 1000 mL via INTRAVENOUS

## 2019-02-04 MED ORDER — PENICILLIN G POTASSIUM 20000000 UNITS IJ SOLR: 4.0000 10*6.[IU] | Freq: Four times a day (QID) | INTRAVENOUS | Status: DC

## 2019-02-04 MED ORDER — SODIUM CHLORIDE 0.45 % IV SOLN: INTRAVENOUS | Status: DC

## 2019-02-04 MED ORDER — ETOMIDATE 2 MG/ML IV SOLN
20.0000 mg | Freq: Once | INTRAVENOUS | Status: AC
  Administered 2019-02-04: 20 mg via INTRAVENOUS

## 2019-02-04 MED ORDER — VANCOMYCIN HCL 10 G IV SOLR
1500.0000 mg | Freq: Once | INTRAVENOUS | Status: AC
  Administered 2019-02-05: 1500 mg via INTRAVENOUS
  Filled 2019-02-04: qty 1500

## 2019-02-04 MED ORDER — FENTANYL 2500MCG IN NS 250ML (10MCG/ML) PREMIX INFUSION
25.0000 ug/h | INTRAVENOUS | Status: DC
  Administered 2019-02-04: 150 ug/h via INTRAVENOUS
  Administered 2019-02-04: 300 ug/h via INTRAVENOUS
  Administered 2019-02-04: 250 ug/h via INTRAVENOUS
  Administered 2019-02-05: 125 ug/h via INTRAVENOUS
  Administered 2019-02-06 (×2): 400 ug/h via INTRAVENOUS
  Administered 2019-02-06: 175 ug/h via INTRAVENOUS
  Administered 2019-02-07 (×2): 400 ug/h via INTRAVENOUS
  Administered 2019-02-07 – 2019-02-08 (×2): 250 ug/h via INTRAVENOUS
  Administered 2019-02-09: 75 ug/h via INTRAVENOUS
  Administered 2019-02-09: 400 ug/h via INTRAVENOUS
  Administered 2019-02-10: 250 ug/h via INTRAVENOUS
  Administered 2019-02-10: 300 ug/h via INTRAVENOUS
  Filled 2019-02-04 (×14): qty 250

## 2019-02-04 MED ORDER — FENTANYL CITRATE (PF) 100 MCG/2ML IJ SOLN
INTRAMUSCULAR | Status: AC
  Filled 2019-02-04: qty 2

## 2019-02-04 MED ORDER — HEPARIN SODIUM (PORCINE) 5000 UNIT/ML IJ SOLN
5000.0000 [IU] | Freq: Three times a day (TID) | INTRAMUSCULAR | Status: AC
  Administered 2019-02-04 – 2019-02-24 (×58): 5000 [IU] via SUBCUTANEOUS
  Filled 2019-02-04 (×61): qty 1

## 2019-02-04 MED ORDER — ORAL CARE MOUTH RINSE
15.0000 mL | OROMUCOSAL | Status: DC
  Administered 2019-02-04 – 2019-02-24 (×199): 15 mL via OROMUCOSAL

## 2019-02-04 MED ORDER — FUROSEMIDE 10 MG/ML IJ SOLN
40.0000 mg | Freq: Once | INTRAMUSCULAR | Status: AC
  Administered 2019-02-04: 40 mg via INTRAVENOUS
  Filled 2019-02-04: qty 4

## 2019-02-04 MED ORDER — SODIUM CHLORIDE 0.9 % IV SOLN
250.0000 mL | INTRAVENOUS | Status: DC
  Administered 2019-02-04 (×3): 250 mL via INTRAVENOUS

## 2019-02-04 MED ORDER — PANTOPRAZOLE SODIUM 40 MG IV SOLR
40.0000 mg | Freq: Every day | INTRAVENOUS | Status: DC
  Administered 2019-02-04 – 2019-02-16 (×13): 40 mg via INTRAVENOUS
  Filled 2019-02-04 (×13): qty 40

## 2019-02-04 MED ORDER — ROCURONIUM BROMIDE 50 MG/5ML IV SOLN
50.0000 mg | Freq: Once | INTRAVENOUS | Status: AC
  Administered 2019-02-04: 50 mg via INTRAVENOUS
  Filled 2019-02-04: qty 5

## 2019-02-04 MED ORDER — DEXTROSE 50 % IV SOLN
50.0000 mL | Freq: Once | INTRAVENOUS | Status: AC
  Administered 2019-02-04: 50 mL via INTRAVENOUS
  Filled 2019-02-04: qty 50

## 2019-02-04 MED ORDER — INSULIN REGULAR(HUMAN) IN NACL 100-0.9 UT/100ML-% IV SOLN
INTRAVENOUS | Status: DC
  Administered 2019-02-04: 2.8 [IU]/h via INTRAVENOUS
  Administered 2019-02-05: 11.9 [IU]/h via INTRAVENOUS
  Filled 2019-02-04 (×4): qty 100

## 2019-02-04 MED ORDER — CLINDAMYCIN PHOSPHATE 600 MG/50ML IV SOLN
600.0000 mg | Freq: Three times a day (TID) | INTRAVENOUS | Status: DC
  Administered 2019-02-05 (×2): 600 mg via INTRAVENOUS
  Filled 2019-02-04 (×3): qty 50

## 2019-02-04 MED ORDER — NOREPINEPHRINE BITARTRATE 1 MG/ML IV SOLN
2.0000 ug/min | INTRAVENOUS | Status: DC
  Administered 2019-02-04: 2 ug/min via INTRAVENOUS
  Administered 2019-02-04: 10 ug/min via INTRAVENOUS
  Filled 2019-02-04: qty 4

## 2019-02-04 MED ORDER — DEXTROSE-NACL 5-0.45 % IV SOLN
INTRAVENOUS | Status: DC
  Administered 2019-02-07: 01:00:00 via INTRAVENOUS

## 2019-02-04 MED ORDER — INSULIN ASPART 100 UNIT/ML IV SOLN
10.0000 [IU] | Freq: Once | INTRAVENOUS | Status: AC
  Administered 2019-02-04: 10 [IU] via INTRAVENOUS

## 2019-02-04 MED ORDER — INSULIN REGULAR BOLUS VIA INFUSION
0.0000 [IU] | Freq: Three times a day (TID) | INTRAVENOUS | Status: DC
  Filled 2019-02-04: qty 10

## 2019-02-04 MED ORDER — IBUPROFEN 400 MG PO TABS
400.0000 mg | ORAL_TABLET | Freq: Once | ORAL | Status: AC
  Administered 2019-02-04: 400 mg via ORAL
  Filled 2019-02-04: qty 1

## 2019-02-04 MED ORDER — DEXMEDETOMIDINE HCL IN NACL 400 MCG/100ML IV SOLN
0.0000 ug/kg/h | INTRAVENOUS | Status: AC
  Administered 2019-02-04: 1 ug/kg/h via INTRAVENOUS
  Administered 2019-02-04: 0.8 ug/kg/h via INTRAVENOUS
  Administered 2019-02-04: 1.2 ug/kg/h via INTRAVENOUS
  Administered 2019-02-05 (×3): 0.4 ug/kg/h via INTRAVENOUS
  Administered 2019-02-06: 1.2 ug/kg/h via INTRAVENOUS
  Administered 2019-02-06: 0.6 ug/kg/h via INTRAVENOUS
  Administered 2019-02-06: 0.9 ug/kg/h via INTRAVENOUS
  Administered 2019-02-06 – 2019-02-07 (×3): 1.2 ug/kg/h via INTRAVENOUS
  Filled 2019-02-04 (×10): qty 100

## 2019-02-04 MED ORDER — CALCIUM CHLORIDE 10 % IV SOLN
1.0000 g | Freq: Once | INTRAVENOUS | Status: DC
  Filled 2019-02-04 (×2): qty 10

## 2019-02-04 MED ORDER — ACETAMINOPHEN 160 MG/5ML PO SOLN
650.0000 mg | Freq: Four times a day (QID) | ORAL | Status: DC | PRN
  Administered 2019-02-05 – 2019-03-04 (×17): 650 mg via ORAL
  Filled 2019-02-04 (×18): qty 20.3

## 2019-02-04 MED ORDER — SODIUM CHLORIDE 0.9 % IV SOLN
INTRAVENOUS | Status: DC
  Administered 2019-02-04 – 2019-02-06 (×5): via INTRAVENOUS

## 2019-02-04 MED ORDER — MIDAZOLAM HCL 2 MG/2ML IJ SOLN
INTRAMUSCULAR | Status: AC
  Filled 2019-02-04: qty 4

## 2019-02-04 MED ORDER — SODIUM BICARBONATE 8.4 % IV SOLN
50.0000 meq | Freq: Once | INTRAVENOUS | Status: AC
  Administered 2019-02-04: 50 meq via INTRAVENOUS
  Filled 2019-02-04: qty 50

## 2019-02-04 MED ORDER — ONDANSETRON HCL 4 MG/2ML IJ SOLN
4.0000 mg | Freq: Four times a day (QID) | INTRAMUSCULAR | Status: DC | PRN
  Administered 2019-02-08 (×2): 4 mg via INTRAVENOUS
  Filled 2019-02-04 (×2): qty 2

## 2019-02-04 MED ORDER — FENTANYL CITRATE (PF) 100 MCG/2ML IJ SOLN
50.0000 ug | Freq: Once | INTRAMUSCULAR | Status: AC
  Administered 2019-02-04: 50 ug via INTRAVENOUS

## 2019-02-04 MED ORDER — HEPARIN SODIUM (PORCINE) 5000 UNIT/ML IJ SOLN
5000.0000 [IU] | Freq: Three times a day (TID) | INTRAMUSCULAR | Status: DC
  Administered 2019-02-04: 5000 [IU] via SUBCUTANEOUS
  Filled 2019-02-04: qty 1

## 2019-02-04 MED ORDER — CALCIUM GLUCONATE-NACL 1-0.675 GM/50ML-% IV SOLN
1.0000 g | Freq: Once | INTRAVENOUS | Status: AC
  Administered 2019-02-04: 1000 mg via INTRAVENOUS
  Filled 2019-02-04 (×2): qty 50

## 2019-02-04 MED ORDER — SODIUM CHLORIDE 0.9 % IV SOLN
250.0000 mL | INTRAVENOUS | Status: DC
  Administered 2019-02-04 – 2019-02-06 (×2): 250 mL via INTRAVENOUS
  Administered 2019-02-12: 500 mL via INTRAVENOUS
  Administered 2019-02-15 – 2019-02-28 (×6): 250 mL via INTRAVENOUS

## 2019-02-04 MED ORDER — DEXTROSE 50 % IV SOLN
25.0000 mL | INTRAVENOUS | Status: DC | PRN
  Administered 2019-02-25 – 2019-03-05 (×4): 25 mL via INTRAVENOUS
  Filled 2019-02-04 (×4): qty 50

## 2019-02-04 MED ORDER — NOREPINEPHRINE 4 MG/250ML-% IV SOLN
INTRAVENOUS | Status: AC
  Filled 2019-02-04: qty 250

## 2019-02-04 MED ORDER — FENTANYL CITRATE (PF) 100 MCG/2ML IJ SOLN
100.0000 ug | Freq: Once | INTRAMUSCULAR | Status: AC
  Administered 2019-02-04: 100 ug via INTRAVENOUS

## 2019-02-04 MED ORDER — SODIUM ZIRCONIUM CYCLOSILICATE 10 G PO PACK
10.0000 g | PACK | Freq: Once | ORAL | Status: AC
  Administered 2019-02-04: 10 g via ORAL
  Filled 2019-02-04: qty 1

## 2019-02-04 MED ORDER — VANCOMYCIN HCL IN DEXTROSE 1-5 GM/200ML-% IV SOLN
1000.0000 mg | Freq: Once | INTRAVENOUS | Status: AC
  Administered 2019-02-04: 1000 mg via INTRAVENOUS
  Filled 2019-02-04: qty 200

## 2019-02-04 MED ORDER — ACETAMINOPHEN 325 MG PO TABS: 650.0000 mg | ORAL_TABLET | ORAL | Status: DC | PRN

## 2019-02-04 MED ORDER — CHLORHEXIDINE GLUCONATE 0.12% ORAL RINSE (MEDLINE KIT)
15.0000 mL | Freq: Two times a day (BID) | OROMUCOSAL | Status: DC
  Administered 2019-02-04 – 2019-02-24 (×41): 15 mL via OROMUCOSAL

## 2019-02-04 MED ORDER — MIDAZOLAM HCL 2 MG/2ML IJ SOLN
2.0000 mg | Freq: Once | INTRAMUSCULAR | Status: AC
  Administered 2019-02-04: 2 mg via INTRAVENOUS

## 2019-02-04 MED ORDER — PIPERACILLIN-TAZOBACTAM 3.375 G IVPB 30 MIN
3.3750 g | Freq: Once | INTRAVENOUS | Status: AC
  Administered 2019-02-04: 3.375 g via INTRAVENOUS
  Filled 2019-02-04 (×2): qty 50

## 2019-02-04 MED ORDER — INSULIN ASPART 100 UNIT/ML ~~LOC~~ SOLN
0.0000 [IU] | SUBCUTANEOUS | Status: DC
  Administered 2019-02-04 (×3): 9 [IU] via SUBCUTANEOUS

## 2019-02-04 MED ORDER — PENICILLIN G POTASSIUM 20000000 UNITS IJ SOLR
4.0000 10*6.[IU] | Freq: Four times a day (QID) | INTRAVENOUS | Status: DC
  Administered 2019-02-04 – 2019-02-06 (×6): 4 10*6.[IU] via INTRAVENOUS
  Filled 2019-02-04 (×7): qty 4

## 2019-02-04 NOTE — ED Notes (Signed)
Pt speaks [place]

## 2019-02-04 NOTE — H&P (Addendum)
NAME:  Gabrielle Wright, MRN:  [id number], DOB:  08-23-1946, LOS: 0 ADMISSION DATE:  02/03/2019, CONSULTATION DATE: 02/04/2019 REFERRING MD: Emergency department physician cHIEF COMPLAINT: Fever respiratory failure  Brief History   73 year old female refractory hypotension and respiratory distress required intubation on 02/04/2019  History of present illness   73 year old female is visiting from [place].  Noted to have a high fever 103.9.  Reported to have cough.  Taken to [facility] per family.  Was supported to have a urinary tract infection although her urine was clean.  She never filled her prescription for antibiotics.  Due to persistent hypotension and refractory nature of her hypertension she is transferred to [facility] 02/04/2019.  She was in respiratory distress for abdominal chest wall paradoxus.  She required intubation and central line placement.  She had maxed out on peripheral IV vasopressors therefore central line was placed.  Pulmonary critical care will be responsible for care at this time.  She was placed in isolation since she came from a [place] entry of [place] with recent reports of coronavirus and that country.  Note [place] is not on the list of infected countries.  Infectious disease was called and they reported again diagnosis done on 1 reported countries.  Past Medical History  Hypertension Coronary artery disease Suspected diabetes review of her arterial consult receiving correct.  Significant Hospital IsEvents   02/04/2019 transfer from [facility] to [facility] required intubation and pressors.  Consults:  02/04/2019 ID called>>  Procedures:  02/04/2019 intubation>> 02/04/2019 and central line placement>>  Significant Diagnostic Tests:  Patient 2020 chest x-ray essentially clear  Micro Data:  02/04/2019 blood cultures x2>> 02/04/2019 sputum culture>> 02/04/2019 urine culture>> 02/04/2019 respiratory virus panel>> 02/04/2019 flu a  and B>>  Possible coronavirus testing 02/04/2019>>   Antimicrobials:  02/04/2019 vancomycin>> 02/04/2019 Zosyn>>  Interim history/subjective:  73 year old female non-English-speaking from [place] is visiting family.  Noted to have a high fever 103.9.  Transferred from [facility] to [facility] via [facility] service.  Noted to be in respiratory distress requiring urgent intubation  Objective   Blood pressure (!) 84/53, pulse 76, temperature 97.9 F (36.6 C), temperature source Oral, resp. rate (!) 25, height 5\' 6"  (1.676 m), weight 80.2 kg, SpO2 95 %.        Intake/Output Summary (Last 24 hours) at 02/04/2019 0751 Last data filed at 02/04/2019 0424 Gross per 24 hour  Intake 3250 ml  Output 200 ml  Net 3050 ml   Filed Weights   02/04/19 0442 02/04/19 0600  Weight: 80.7 kg 80.2 kg    Examination: General: Elderly female who does not speak English, difficult to arouse, abdominal chest wall paradoxus noted HENT: No JVD or lymphadenopathy is appreciated Lungs: Decreased breath sounds throughout.  Abdominal chest wall paradoxus Cardiovascular: Sounds irregular regular rate and rhythm Abdomen: Obese, diffusely tender positive bowel sounds Extremities: Warm and dry without edema Neuro: Does not speak [place].  Tracks and follows.  Able to communicate via interpreter   Resolved Hospital Problem list     Assessment & Plan:  Hypotension in the setting of fever with continuation of diuretics and potassium supplements and antihypertensives. -Fluid resuscitation -Vasopressor support -Central line placement -Empirical antimicrobial therapy -Viral testing empirically  Acute respiratory distress in the setting of fever 103.9 with clear chest x-ray.  Obvious abdominal chest wall paradoxus prior to intubation -Urgent intubation and already 02/04/2019 -Vent bundle -No bronchodilators required at this time -Tracheal aspirate  will be sent completeness -Arterial  blood gases with vent adjustments as needed  Suspected viral infection cannot rule out coronavirus at this time. -Isolation -Contact infectious disease for possible testing with coronavirus -Respiratory virus panel -Flu a/b is being checked  Acute renal insufficiency Lab Results  Component Value Date   CREATININE 1.62 (H) 02/03/2019  In the setting of chronic diuresis therapy ingestion of medications while sick. -Monitor renal function -Avoid nephrotoxins  Hyperkalemia Recent Labs  Lab 02/03/19 2354  K 5.2*   -Monitor potassium -Monitor renal function -Stop potassium supplement  History of coronary disease, hypertension -Check twelve-lead for completeness  Hyperglycemia CBG (last 3)  Recent Labs    02/04/19 0636  GLUCAP 310*   -Sliding scale insulin -Check hemoglobin A1c   Best practice:  Diet:NPO Pain/Anxiety/Delirium protocol (if indicated): Sedation protocol VAP protocol (if indicated): Y DVT prophylaxis: Y GI prophylaxis: Y Glucose control: SSI Mobility: BR Code Status: full Family Communication: NO family at bedside Disposition: ICU  Labs   CBC: Recent Labs  Lab 02/03/19 2354  WBC 10.3  NEUTROABS 9.5*  HGB 11.6*  HCT 38.9  MCV 81.0  PLT 187    Basic Metabolic Panel: Recent Labs  Lab 02/03/19 2354  NA 131*  K 5.2*  CL 100  CO2 22  GLUCOSE 223*  BUN 33*  CREATININE 1.62*  CALCIUM 9.1   GFR: Estimated Creatinine Clearance: 33.5 mL/min (A) (by C-G formula based on SCr of 1.62 mg/dL (H)). Recent Labs  Lab 02/03/19 2354 02/04/19 0254  WBC 10.3  --   LATICACIDVEN 1.9 1.7    Liver Function Tests: Recent Labs  Lab 02/03/19 2354  AST 29  ALT 16  ALKPHOS 66  BILITOT 0.5  PROT 7.6  ALBUMIN 4.0   No results for input(s): LIPASE, AMYLASE in the last 168 hours. No results for input(s): AMMONIA in the last 168 hours.  ABG No results found for: PHART, PCO2ART, PO2ART, HCO3, TCO2, ACIDBASEDEF, O2SAT   Coagulation Profile: No  results for input(s): INR, PROTIME in the last 168 hours.  Cardiac Enzymes: No results for input(s): CKTOTAL, CKMB, CKMBINDEX, TROPONINI in the last 168 hours.  HbA1C: No results found for: HGBA1C  CBG: Recent Labs  Lab 02/04/19 0636  GLUCAP 310*    Review of Systems:   History of coronary artery disease Hypertension But she is on Metroprolol Cardizem and potassium and Lasix.  Past Medical History  She,  has a past medical history of Coronary artery disease, Diabetes mellitus without complication (HCC), and Hypertension.   Surgical History    Past Surgical History:  Procedure Laterality Date  . CORONARY ANGIOPLASTY WITH STENT PLACEMENT       Social History   reports that she has never smoked. She has never used smokeless tobacco. She reports that she does not drink alcohol or use drugs.   Family History   Her family history is not on file.   Allergies No Known Allergies   Home Medications  Prior to Admission medications   Not on File     Critical care time: 65 min    Brett Canales Minor ACNP Adolph Pollack PCCM Pager [phone] till 1 pm If no answer page [phone] 02/04/2019, 7:54 AM  Attending Note:  73 year old female from [place] who arrived to the [place] 1 week ago who presents to PCCM with respiratory failure and septic shock.  The patient does not speak english and is in enough respiratory distress that we were unable to obtain  any further history.  On exam, the patient is clearly in respiratory failure and hypotensive.  I reviewed CXR myself, no acute disease noted.  Discussed with PCCM-NP.  Will intubate, place on mild sedation, full vent support, adjust vent for ABG, consult with ID if this is a potential COVID-19 case, RVP with positive rhino, called the state for testing and awaiting a call bad, place TLC for pressor support, all inform given to the state and communicated with the staff.  PCCM will continue to manage for now, further recommendations per ID.  The  patient is critically ill with multiple organ systems failure and requires high complexity decision making for assessment and support, frequent evaluation and titration of therapies, application of advanced monitoring technologies and extensive interpretation of multiple databases.   Critical Care Time devoted to patient care services described in this note is  90  Minutes. This time reflects time of care of this signee Dr Koren Bound. This critical care time does not reflect procedure time, or teaching time or supervisory time of PA/NP/Med student/Med Resident etc but could involve care discussion time.  Alyson Reedy, M.D. [facility] Pulmonary/Critical Care Medicine. Pager: [phone]. After hours pager: [phone].

## 2019-02-04 NOTE — Procedures (Signed)
Intubation Procedure Note Fusako Tanabe [id number] 01/08/1946  Procedure: Intubation Indications: Respiratory insufficiency  Procedure Details Consent: Unable to obtain consent because of emergent medical necessity. Time Out: Verified patient identification, verified procedure, site/side was marked, verified correct patient position, special equipment/implants available, medications/allergies/relevent history reviewed, required imaging and test results available.  Performed  Maximum sterile technique was used including gloves, hand hygiene and mask.  MAC    Evaluation Hemodynamic Status: BP stable throughout; O2 sats: stable throughout Patient's Current Condition: stable Complications: No apparent complications Patient did tolerate procedure well. Chest X-ray ordered to verify placement.  CXR: ETT too low, readjusted.   Nekeisha Aure 02/04/2019

## 2019-02-04 NOTE — Procedures (Signed)
Central Venous Catheter Insertion Procedure Note Gabrielle Wright [id number] [birth year]  Procedure: Insertion of Central Venous Catheter Indications: Assessment of intravascular volume, Drug and/or fluid administration and Frequent blood sampling  Procedure Details Consent: Unable to obtain consent because of emergent medical necessity. Time Out: Verified patient identification, verified procedure, site/side was marked, verified correct patient position, special equipment/implants available, medications/allergies/relevent history reviewed, required imaging and test results available.  Performed  Maximum sterile technique was used including antiseptics, cap, gloves, gown, hand hygiene, mask and sheet. Skin prep: Chlorhexidine; local anesthetic administered A antimicrobial bonded/coated triple lumen catheter was placed in the left subclavian vein using the Seldinger technique.  Evaluation Blood flow good Complications: No apparent complications Patient did tolerate procedure well. Chest X-ray ordered to verify placement.  CXR: Normal.  U/S used in placement  YACOUB,WESAM 02/04/2019, 11:23 AM

## 2019-02-04 NOTE — ED Notes (Addendum)
Contacted [facility] Benna Dunks) to repage hospitalist  Contacted [facility] Benna Dunks) to page Critical Care

## 2019-02-04 NOTE — Procedures (Signed)
OGT Placement By MD  OGT placed under direct laryngoscopy and confirmed by auscultation  Juda Lajeunesse G. Gola Bribiesca, M.D. [place] Pulmonary/Critical Care Medicine. Pager: [phone]. After hours pager: [phone]. 

## 2019-02-04 NOTE — Progress Notes (Signed)
ETT tube moved back 2cm per MD. Chest x-ray to follow.

## 2019-02-04 NOTE — Progress Notes (Signed)
PHARMACY - PHYSICIAN COMMUNICATION CRITICAL VALUE ALERT - BLOOD CULTURE IDENTIFICATION (BCID)  Gabrielle Wright is an 73 y.o. female who presented to [facility] on 02/03/2019.  Assessment:  Hypotension, intubated, vasopressors, fever, now with strep pyogenes in 4/4 bottles  Name of physician (or Provider) Contacted: CCM e-link  Current antibiotics: None  Changes to prescribed antibiotics recommended:  Recommendations accepted by provider   Penicillin 4 million units q6h - renal fx borderline Clindamycin 600 mg IV tid Vancomycin 1500 mg IV x1  Results for orders placed or performed during the hospital encounter of 02/03/19  Blood Culture ID Panel (Reflexed) (Collected: 02/03/2019 11:54 PM)  Result Value Ref Range   Enterococcus species NOT DETECTED NOT DETECTED   Listeria monocytogenes NOT DETECTED NOT DETECTED   Staphylococcus species NOT DETECTED NOT DETECTED   Staphylococcus aureus (BCID) NOT DETECTED NOT DETECTED   Streptococcus species DETECTED (A) NOT DETECTED   Streptococcus agalactiae NOT DETECTED NOT DETECTED   Streptococcus pneumoniae NOT DETECTED NOT DETECTED   Streptococcus pyogenes DETECTED (A) NOT DETECTED   Acinetobacter baumannii NOT DETECTED NOT DETECTED   Enterobacteriaceae species NOT DETECTED NOT DETECTED   Enterobacter cloacae complex NOT DETECTED NOT DETECTED   Escherichia coli NOT DETECTED NOT DETECTED   Klebsiella oxytoca NOT DETECTED NOT DETECTED   Klebsiella pneumoniae NOT DETECTED NOT DETECTED   Proteus species NOT DETECTED NOT DETECTED   Serratia marcescens NOT DETECTED NOT DETECTED   Haemophilus influenzae NOT DETECTED NOT DETECTED   Neisseria meningitidis NOT DETECTED NOT DETECTED   Pseudomonas aeruginosa NOT DETECTED NOT DETECTED   Candida albicans NOT DETECTED NOT DETECTED   Candida glabrata NOT DETECTED NOT DETECTED   Candida krusei NOT DETECTED NOT DETECTED   Candida parapsilosis NOT DETECTED NOT DETECTED   Candida tropicalis NOT DETECTED NOT  DETECTED    Baldemar Friday 02/04/2019  10:12 PM

## 2019-02-04 NOTE — Progress Notes (Signed)
Results for URIEL, SAGER (MRN [id number]) as of 02/04/2019 18:50  Ref. Range 02/04/2019 17:58  Potassium Latest Ref Range: 3.5 - 5.1 mmol/L 6.8 (HH)  YACOUB notified verbal orders given see [facility]

## 2019-02-04 NOTE — Progress Notes (Signed)
eLink Physician-Brief Progress Note Patient Name: Gabrielle Wright DOB: 07-11-1946 MRN: [id number]   Date of Service  02/04/2019  HPI/Events of Note  Agitation - Request for bilateral soft wrist restraints.   eICU Interventions  Will order: 1. Bilateral soft wrist restraints.      Intervention Category Major Interventions: Delirium, psychosis, severe agitation - evaluation and management  Rajohn Henery Eugene 02/04/2019, 8:05 PM

## 2019-02-04 NOTE — Progress Notes (Signed)
Inpatient Diabetes Program Recommendations  AACE/ADA: New Consensus Statement on Inpatient Glycemic Control (2015)  Target Ranges:  Prepandial:   less than 140 mg/dL      Peak postprandial:   less than 180 mg/dL (1-2 hours)      Critically ill patients:  140 - 180 mg/dL   Results for ELMINA, CHAUSSEE (MRN [id number]) as of 02/04/2019 13:19  Ref. Range 02/04/2019 06:36 02/04/2019 10:12 02/04/2019 11:39  Glucose-Capillary Latest Ref Range: 70 - 99 mg/dL 838 (H) 184 (H)  9 units NOVOLOG  460 (H)  19 units NOVOLOG       Admit Hypotension in the setting of fever/ Acute respiratory distress in the setting of fever 103.9/ Suspected viral infection   History: DM  Home Meds: Unsure at this time  Current Orders: Novolog Sensitive Correction Scale/ SSI (0-9 units) Q4 hours     Pt Intubated.   Starting Novolog SSI this AM.   Current A1c shows poor glucose control prior to admission.    MD- Please consider initiating ICU Glycemic Control Protocol--Start with Phase 2 IV Insulin Drip  CBG 460 mg/dl at 03:75OH today      --Will follow patient during hospitalization--  Ambrose Finland RN, MSN, CDE Diabetes Coordinator Inpatient Glycemic Control Team Team Pager: [phone] (8a-5p)

## 2019-02-04 NOTE — Progress Notes (Signed)
eLink Physician-Brief Progress Note Patient Name: Gabrielle Wright DOB: 06/14/1946 MRN: [id number]   Date of Service  02/04/2019  HPI/Events of Note  Hyperkalemia - K+ = 6.8 at 5:58 PM.  eICU Interventions  Will order: 1. BMP STAT.  Further Rx based on K+ level.      Intervention Category Major Interventions: Electrolyte abnormality - evaluation and management  Tahra Hitzeman Eugene 02/04/2019, 8:43 PM

## 2019-02-04 NOTE — Consult Note (Signed)
[facility]       Reason for Consult: respiratory failure    Referring Physician: Dr. Nelda Marseille  Active Problems:   Sepsis ([place])   Hypotension   . chlorhexidine gluconate (MEDLINE KIT)  15 mL Mouth Rinse BID  . etomidate  20 mg Intravenous Once  . fentaNYL      . fentaNYL (SUBLIMAZE) injection  100 mcg Intravenous Once  . fentaNYL (SUBLIMAZE) injection  50 mcg Intravenous Once  . heparin  5,000 Units Subcutaneous Q8H  . insulin aspart  0-9 Units Subcutaneous Q4H  . mouth rinse  15 mL Mouth Rinse 10 times per day  . midazolam      . midazolam  2 mg Intravenous Once  . pantoprazole (PROTONIX) IV  40 mg Intravenous QHS  . rocuronium  50 mg Intravenous Once    Recommendations: Supportive care   Assessment: She has rhinovirus positive respiratory failure.    Antibiotics: none  HPI: Gabrielle Wright is a 73 y.o. female from [place] visiting here and noted to have a fever to 103.9, respiratory distress and admitted.  Required intubation.  Viral panel now positive as above.  No history obtainable from the patient. CXR clear.  Lactic acid normal.  Procalcitonin pending.  WBC 10.3 initially, now 46.3.   CXR independently reviewed and is clear.   Review of Systems:  Unable to be assessed due to mental status All other systems reviewed and are negative    Past Medical History:  Diagnosis Date  . Coronary artery disease   . Diabetes mellitus without complication ([place])   . Hypertension     Social History   Tobacco Use  . Smoking status: Never Smoker  . Smokeless tobacco: Never Used  Substance Use Topics  . Alcohol use: Never    Frequency: Never  . Drug use: Never  per record  [place]: unobtainable due to patient factors  No Known Allergies  Physical Exam: Constitutional: intubated, sedated Vitals:   02/04/19 0810 02/04/19 0930  BP:    Pulse:  70  Resp:  18  Temp:    SpO2: 100% 100%   EYES: anicteric ENMT: +ET Cardiovascular: Cor  Tachy Respiratory: CTA B, anterior exam; respiratory effort on vent GI: Bowel sounds are normal, liver is not enlarged, spleen is not enlarged Musculoskeletal: no pedal edema noted Skin: negatives: no rash Hematologic: no cervical lad  Lab Results  Component Value Date   WBC 46.3 (H) 02/04/2019   HGB 11.2 (L) 02/04/2019   HCT 33.0 (L) 02/04/2019   MCV 82.0 02/04/2019   PLT 236 02/04/2019    Lab Results  Component Value Date   CREATININE 1.62 (H) 02/03/2019   BUN 33 (H) 02/03/2019   NA 131 (L) 02/04/2019   K 7.0 (HH) 02/04/2019   CL 100 02/03/2019   CO2 22 02/03/2019    Lab Results  Component Value Date   ALT 16 02/03/2019   AST 29 02/03/2019   ALKPHOS 66 02/03/2019     Microbiology: Recent Results (from the past 240 hour(s))  Respiratory Panel by PCR     Status: Abnormal   Collection Time: 02/04/19  6:36 AM  Result Value Ref Range Status   Adenovirus NOT DETECTED NOT DETECTED Final   Coronavirus 229E NOT DETECTED NOT DETECTED Final    Comment: (NOTE) The Coronavirus on the Respiratory Panel, DOES NOT test for the novel  Coronavirus (2019 nCoV)    Coronavirus HKU1 NOT DETECTED NOT DETECTED Final  Coronavirus NL63 NOT DETECTED NOT DETECTED Final   Coronavirus OC43 NOT DETECTED NOT DETECTED Final   Metapneumovirus NOT DETECTED NOT DETECTED Final   Rhinovirus / Enterovirus DETECTED (A) NOT DETECTED Final   Influenza A NOT DETECTED NOT DETECTED Final   Influenza B NOT DETECTED NOT DETECTED Final   Parainfluenza Virus 1 NOT DETECTED NOT DETECTED Final   Parainfluenza Virus 2 NOT DETECTED NOT DETECTED Final   Parainfluenza Virus 3 NOT DETECTED NOT DETECTED Final   Parainfluenza Virus 4 NOT DETECTED NOT DETECTED Final   Respiratory Syncytial Virus NOT DETECTED NOT DETECTED Final   Bordetella pertussis NOT DETECTED NOT DETECTED Final   Chlamydophila pneumoniae NOT DETECTED NOT DETECTED Final   Mycoplasma pneumoniae NOT DETECTED NOT DETECTED Final    Comment: Performed  at [facility], [place] [address] [place], [place] [address]  MRSA PCR Screening     Status: None   Collection Time: 02/04/19  6:36 AM  Result Value Ref Range Status   MRSA by PCR NEGATIVE NEGATIVE Final    Comment:        The GeneXpert MRSA Assay (FDA approved for NASAL specimens only), is one component of a comprehensive MRSA colonization surveillance program. It is not intended to diagnose MRSA infection nor to guide or monitor treatment for MRSA infections. Performed at [facility], [place] [address], [place], [place] [address]     Allaina Brotzman W Arvis Miguez, [facility] Infectious Disease [email or web address] 02/04/2019, 10:00 AM

## 2019-02-04 NOTE — ED Notes (Signed)
[facility] notified (Taryn) - patient ready for transport 

## 2019-02-05 DIAGNOSIS — R7881 Bacteremia: Secondary | ICD-10-CM

## 2019-02-05 DIAGNOSIS — B950 Streptococcus, group A, as the cause of diseases classified elsewhere: Secondary | ICD-10-CM

## 2019-02-05 LAB — GLUCOSE, CAPILLARY
GLUCOSE-CAPILLARY: 187 mg/dL — AB (ref 70–99)
GLUCOSE-CAPILLARY: 309 mg/dL — AB (ref 70–99)
Glucose-Capillary: 108 mg/dL — ABNORMAL HIGH (ref 70–99)
Glucose-Capillary: 111 mg/dL — ABNORMAL HIGH (ref 70–99)
Glucose-Capillary: 113 mg/dL — ABNORMAL HIGH (ref 70–99)
Glucose-Capillary: 132 mg/dL — ABNORMAL HIGH (ref 70–99)
Glucose-Capillary: 138 mg/dL — ABNORMAL HIGH (ref 70–99)
Glucose-Capillary: 156 mg/dL — ABNORMAL HIGH (ref 70–99)
Glucose-Capillary: 162 mg/dL — ABNORMAL HIGH (ref 70–99)
Glucose-Capillary: 179 mg/dL — ABNORMAL HIGH (ref 70–99)
Glucose-Capillary: 213 mg/dL — ABNORMAL HIGH (ref 70–99)
Glucose-Capillary: 230 mg/dL — ABNORMAL HIGH (ref 70–99)

## 2019-02-05 LAB — BASIC METABOLIC PANEL
Anion gap: 5 (ref 5–15)
Anion gap: 9 (ref 5–15)
BUN: 27 mg/dL — AB (ref 8–23)
BUN: 21 mg/dL (ref 8–23)
CO2: 20 mmol/L — ABNORMAL LOW (ref 22–32)
CO2: 19 mmol/L — ABNORMAL LOW (ref 22–32)
Calcium: 7.2 mg/dL — ABNORMAL LOW (ref 8.9–10.3)
Calcium: 7.1 mg/dL — ABNORMAL LOW (ref 8.9–10.3)
Chloride: 113 mmol/L — ABNORMAL HIGH (ref 98–111)
Chloride: 111 mmol/L (ref 98–111)
Creatinine, Ser: 1.59 mg/dL — ABNORMAL HIGH (ref 0.44–1.00)
Creatinine, Ser: 1.43 mg/dL — ABNORMAL HIGH (ref 0.44–1.00)
GFR calc Af Amer: 37 mL/min — ABNORMAL LOW (ref >60)
GFR calc Af Amer: 42 mL/min — ABNORMAL LOW (ref >60)
GFR calc non Af Amer: 32 mL/min — ABNORMAL LOW (ref >60)
GFR calc non Af Amer: 36 mL/min — ABNORMAL LOW (ref >60)
Glucose, Bld: 249 mg/dL — ABNORMAL HIGH (ref 70–99)
Glucose, Bld: 167 mg/dL — ABNORMAL HIGH (ref 70–99)
Potassium: 4.4 mmol/L (ref 3.5–5.1)
Potassium: 4 mmol/L (ref 3.5–5.1)
SODIUM: 138 mmol/L (ref 135–145)
Sodium: 139 mmol/L (ref 135–145)

## 2019-02-05 LAB — URINE CULTURE: Culture: NO GROWTH

## 2019-02-05 LAB — CBC
HCT: 33.3 % — ABNORMAL LOW (ref 36.0–46.0)
Hemoglobin: 10.1 g/dL — ABNORMAL LOW (ref 12.0–15.0)
MCH: 24.7 pg — ABNORMAL LOW (ref 26.0–34.0)
MCHC: 30.3 g/dL (ref 30.0–36.0)
MCV: 81.4 fL (ref 80.0–100.0)
Platelets: 167 10*3/uL (ref 150–400)
RBC: 4.09 MIL/uL (ref 3.87–5.11)
RDW: 17.7 % — ABNORMAL HIGH (ref 11.5–15.5)
WBC: 21.3 10*3/uL — ABNORMAL HIGH (ref 4.0–10.5)
nRBC: 0 % (ref 0.0–0.2)

## 2019-02-05 LAB — POCT I-STAT 7, (LYTES, BLD GAS, ICA,H+H)
Acid-base deficit: 5 mmol/L — ABNORMAL HIGH (ref 0.0–2.0)
Bicarbonate: 18.7 mmol/L — ABNORMAL LOW (ref 20.0–28.0)
Calcium, Ion: 1.07 mmol/L — ABNORMAL LOW (ref 1.15–1.40)
HCT: 31 % — ABNORMAL LOW (ref 36.0–46.0)
Hemoglobin: 10.5 g/dL — ABNORMAL LOW (ref 12.0–15.0)
O2 Saturation: 99 %
Patient temperature: 99.5
Potassium: 4.1 mmol/L (ref 3.5–5.1)
Sodium: 141 mmol/L (ref 135–145)
TCO2: 20 mmol/L — ABNORMAL LOW (ref 22–32)
pCO2 arterial: 29.4 mmHg — ABNORMAL LOW (ref 32.0–48.0)
pH, Arterial: 7.414 (ref 7.350–7.450)
pO2, Arterial: 147 mmHg — ABNORMAL HIGH (ref 83.0–108.0)

## 2019-02-05 LAB — PHOSPHORUS: Phosphorus: 2.6 mg/dL (ref 2.5–4.6)

## 2019-02-05 LAB — MAGNESIUM: Magnesium: 1.4 mg/dL — ABNORMAL LOW (ref 1.7–2.4)

## 2019-02-05 LAB — STREP PNEUMONIAE URINARY ANTIGEN: Strep Pneumo Urinary Antigen: NEGATIVE

## 2019-02-05 MED ORDER — INSULIN ASPART 100 UNIT/ML ~~LOC~~ SOLN
0.0000 [IU] | SUBCUTANEOUS | Status: DC
  Administered 2019-02-05: 12 [IU] via SUBCUTANEOUS
  Administered 2019-02-06: 15 [IU] via SUBCUTANEOUS
  Administered 2019-02-06 (×2): 11 [IU] via SUBCUTANEOUS

## 2019-02-05 MED ORDER — PRO-STAT SUGAR FREE PO LIQD
30.0000 mL | Freq: Two times a day (BID) | ORAL | Status: DC
  Administered 2019-02-05 – 2019-02-07 (×5): 30 mL
  Filled 2019-02-05 (×5): qty 30

## 2019-02-05 MED ORDER — VITAL AF 1.2 CAL PO LIQD
1000.0000 mL | ORAL | Status: DC
  Administered 2019-02-05: 1000 mL

## 2019-02-05 MED ORDER — CHLORHEXIDINE GLUCONATE CLOTH 2 % EX PADS
6.0000 | MEDICATED_PAD | Freq: Every day | CUTANEOUS | Status: DC
  Administered 2019-02-05 – 2019-02-07 (×3): 6 via TOPICAL

## 2019-02-05 MED ORDER — SODIUM CHLORIDE 0.9% FLUSH
10.0000 mL | Freq: Two times a day (BID) | INTRAVENOUS | Status: DC
  Administered 2019-02-05 – 2019-02-07 (×7): 10 mL
  Administered 2019-02-08: 40 mL
  Administered 2019-02-08: 10 mL
  Administered 2019-02-09: 30 mL
  Administered 2019-02-09 – 2019-02-15 (×12): 10 mL
  Administered 2019-02-15 – 2019-02-16 (×2): 40 mL
  Administered 2019-02-16 – 2019-02-27 (×20): 10 mL
  Administered 2019-02-27: 20 mL
  Administered 2019-02-28 – 2019-03-04 (×5): 10 mL
  Administered 2019-03-05: 21:00:00 20 mL
  Administered 2019-03-05 – 2019-03-06 (×2): 10 mL
  Administered 2019-03-06: 20 mL
  Administered 2019-03-07 – 2019-03-14 (×16): 10 mL
  Administered 2019-03-15: 20 mL
  Administered 2019-03-15 – 2019-03-16 (×2): 10 mL
  Administered 2019-03-16 – 2019-03-17 (×2): 20 mL
  Administered 2019-03-17 – 2019-03-18 (×2): 10 mL
  Administered 2019-03-18: 20 mL
  Administered 2019-03-19: 40 mL
  Administered 2019-03-20: 10 mL

## 2019-02-05 MED ORDER — INSULIN ASPART 100 UNIT/ML ~~LOC~~ SOLN
2.0000 [IU] | SUBCUTANEOUS | Status: DC
  Administered 2019-02-05: 6 [IU] via SUBCUTANEOUS
  Administered 2019-02-05: 4 [IU] via SUBCUTANEOUS
  Administered 2019-02-05: 6 [IU] via SUBCUTANEOUS
  Administered 2019-02-05: 2 [IU] via SUBCUTANEOUS

## 2019-02-05 MED ORDER — MAGNESIUM SULFATE 4 GM/100ML IV SOLN
4.0000 g | Freq: Once | INTRAVENOUS | Status: AC
  Administered 2019-02-05: 4 g via INTRAVENOUS
  Filled 2019-02-05: qty 100

## 2019-02-05 MED ORDER — INSULIN DETEMIR 100 UNIT/ML ~~LOC~~ SOLN
8.0000 [IU] | Freq: Two times a day (BID) | SUBCUTANEOUS | Status: DC
  Administered 2019-02-05: 8 [IU] via SUBCUTANEOUS
  Filled 2019-02-05 (×2): qty 0.08

## 2019-02-05 MED ORDER — SODIUM CHLORIDE 0.9% FLUSH: 10.0000 mL | INTRAVENOUS | Status: DC | PRN

## 2019-02-05 NOTE — Progress Notes (Signed)
Peep was decreased per verbal order of MD.  

## 2019-02-05 NOTE — Progress Notes (Signed)
Blood cultures positive for Group A Strep, on penicillin.   I will stop clindamycin.   Will continue to follow.

## 2019-02-05 NOTE — Progress Notes (Signed)
eLink Physician-Brief Progress Note Patient Name: Gabrielle Wright DOB: July 12, 1946 MRN: [id number]   Date of Service  02/05/2019  HPI/Events of Note  Hyperglycemia - Blood glucose = 138 --> 113 --> 111.  eICU Interventions  Will transition off of the insulin IC infusion to Levimir 8 units now and Q 12 hours + Q 4 hour Novolog SSI.     Intervention Category Major Interventions: Hyperglycemia - active titration of insulin therapy  Sommer,Steven Dennard Nip 02/05/2019, 5:35 AM

## 2019-02-05 NOTE — Progress Notes (Signed)
NAME:  Gabrielle Wright, MRN:  [id number], DOB:  Sep 06, 1946, LOS: 1 ADMISSION DATE:  02/03/2019, CONSULTATION DATE: 02/04/2019 REFERRING MD: Emergency department physician [email or web address] COMPLAINT: Fever respiratory failure  Brief History   73 year old female refractory hypotension and respiratory distress required intubation on 02/04/2019  History of present illness   73 year old female is visiting from [place].  Noted to have a high fever 103.9.  Reported to have cough.  Taken to [facility] med [facility] per family.  Was supported to have a urinary tract infection although her urine was clean.  She never filled her prescription for antibiotics.  Due to persistent hypotension and refractory nature of her hypertension she is transferred to [facility] 02/04/2019.  She was in respiratory distress for abdominal chest wall paradoxus.  She required intubation and central line placement.  She had maxed out on peripheral IV vasopressors therefore central line was placed.  Pulmonary critical care will be responsible for care at this time.  She was placed in isolation since she came from a [place] entry of [place] with recent reports of coronavirus and that country.  Note [place] is not on the list of infected countries.  Infectious disease was called and they reported again diagnosis done on 1 reported countries.  Past Medical History  Hypertension Coronary artery disease Suspected diabetes review of her arterial consult receiving correct.  Significant Hospital IsEvents   02/04/2019 transfer from [facility] to [facility] required intubation and pressors.  Consults:  02/04/2019 ID called>>  Procedures:  02/04/2019 intubation>> 02/04/2019 and central line placement>>  Significant Diagnostic Tests:  Patient 2020 chest x-ray essentially clear  Micro Data:  02/04/2019 blood cultures x2>> 02/04/2019 sputum culture>> 02/04/2019 urine culture>> 02/04/2019 respiratory virus panel>> 02/04/2019 flu a  and B>>  Possible coronavirus testing 02/04/2019>>   Antimicrobials:  02/04/2019 vancomycin>> 02/04/2019 Zosyn>>  Interim history/subjective:  73 year old female non-English-speaking from [place] is visiting family.  Noted to have a high fever 103.9.  Transfer[facility] via [facility].  Noted to be in respiratory distress requiring urgent intubation  Objective   Blood pressure (!) 86/54, pulse 70, temperature 99.4 F (37.4 C), temperature source Oral, resp. rate 18, height 5\' 6"  (1.676 m), weight 80.7 kg, SpO2 100 %.    Vent Mode: PRVC FiO2 (%):  [30 %] 30 % Set Rate:  [18 bmp] 18 bmp Vt Set:  [500 mL] 500 mL PEEP:  [8 cmH20-10 cmH20] 8 cmH20 Plateau Pressure:  [16 cmH20-20 cmH20] 19 cmH20   Intake/Output Summary (Last 24 hours) at 02/05/2019 1203 Last data filed at 02/05/2019 1200 Gross per 24 hour  Intake 5692.97 ml  Output 3305 ml  Net 2387.97 ml   Filed Weights   02/04/19 0600 02/05/19 0045 02/05/19 0146  Weight: 80.2 kg 80.7 kg 80.7 kg    Examination: General: Acutely ill appearing female, NAD HENT: [place]/AT, PERRL, EOM-I and MMM Lungs: Coarse BS diffusely Cardiovascular: Sounds irregular regular rate and rhythm Abdomen: Obese, diffusely tender positive bowel sounds Extremities: Warm and dry without edema Neuro: Does not speak [place].  Tracks and follows.  Able to communicate via interpreter  I reviewed CXR myself, ETT is in good position  Resolved Hospital Problem list     Assessment & Plan:  Hypotension in the setting of fever with continuation of diuretics and potassium supplements and antihypertensives. - Continue IVF - Levophed as ordered - TLC in place with CVP monitoring - Empirical antimicrobial therapy -  Viral testing empirically  Acute respiratory distress in the setting of fever 103.9 with clear chest x-ray.  Obvious abdominal chest wall paradoxus prior to intubation - Continue full vent  support - Titrate O2 for sat of 88-92% - BD PRN - F/U on corona virus - ABG and CXR in AM   Suspected viral infection cannot rule out coronavirus at this time. - Isolation - IP and ID following - Contact infectious disease for possible testing with coronavirus - Respiratory virus panel - Flu a/b is being checked  Acute renal insufficiency Lab Results  Component Value Date   CREATININE 1.43 (H) 02/05/2019   CREATININE 1.59 (H) 02/04/2019   CREATININE 1.77 (H) 02/04/2019  In the setting of chronic diuresis therapy ingestion of medications while sick. - Monitor renal function - Avoid nephrotoxins  Hyperkalemia Recent Labs  Lab 02/04/19 2200 02/05/19 0254 02/05/19 0739  K 4.4 4.1 4.0   - Monitor potassium - Monitor renal function - Stop potassium supplement  History of coronary disease, hypertension - Check twelve-lead for completeness  Hyperglycemia CBG (last 3)  Recent Labs    02/05/19 0751 02/05/19 0854 02/05/19 1131  GLUCAP 309* 213* 132*   - Sliding scale insulin - Check hemoglobin A1c  PCCM will continue to manage  Best practice:  Diet:NPO Pain/Anxiety/Delirium protocol (if indicated): Sedation protocol VAP protocol (if indicated): Y DVT prophylaxis: Y GI prophylaxis: Y Glucose control: SSI Mobility: BR Code Status: full Family Communication: NO family at bedside Disposition: ICU  Labs   CBC: Recent Labs  Lab 02/03/19 2354 02/04/19 0904 02/04/19 0928 02/05/19 0254 02/05/19 0739  WBC 10.3 46.3*  --   --  21.3*  NEUTROABS 9.5*  --   --   --   --   HGB 11.6* 10.7* 11.2* 10.5* 10.1*  HCT 38.9 36.1 33.0* 31.0* 33.3*  MCV 81.0 82.0  --   --  81.4  PLT 187 236  --   --  167    Basic Metabolic Panel: Recent Labs  Lab 02/03/19 2354 02/04/19 0904 02/04/19 0928 02/04/19 1758 02/04/19 2200 02/05/19 0254 02/05/19 0739  NA 131* 131* 131* 133* 138 141 139  K 5.2* 7.1* 7.0* 6.8* 4.4 4.1 4.0  CL 100 106  --  110 113*  --  111  CO2 22 18*   --  18* 20*  --  19*  GLUCOSE 223* 401*  --  429* 249*  --  167*  BUN 33* 29*  --  30* 27*  --  21  CREATININE 1.62* 1.75*  --  1.77* 1.59*  --  1.43*  CALCIUM 9.1 7.0*  --  6.9* 7.2*  --  7.1*  MG  --  1.2*  --  1.6*  --   --  1.4*  PHOS  --  2.8  --  3.4  --   --  2.6   GFR: Estimated Creatinine Clearance: 38.1 mL/min (A) (by C-G formula based on SCr of 1.43 mg/dL (H)). Recent Labs  Lab 02/03/19 2354 02/04/19 0254 02/04/19 0904 02/05/19 0739  PROCALCITON  --   --  20.03  --   WBC 10.3  --  46.3* 21.3*  LATICACIDVEN 1.9 1.7  --   --     Liver Function Tests: Recent Labs  Lab 02/03/19 2354 02/04/19 0904  AST 29 33  ALT 16 17  ALKPHOS 66 53  BILITOT 0.5 1.2  PROT 7.6 5.8*  ALBUMIN 4.0 2.8*   No results for input(s): LIPASE, AMYLASE in  the last 168 hours. No results for input(s): AMMONIA in the last 168 hours.  ABG    Component Value Date/Time   PHART 7.414 02/05/2019 0254   PCO2ART 29.4 (L) 02/05/2019 0254   PO2ART 147.0 (H) 02/05/2019 0254   HCO3 18.7 (L) 02/05/2019 0254   TCO2 20 (L) 02/05/2019 0254   ACIDBASEDEF 5.0 (H) 02/05/2019 0254   O2SAT 99.0 02/05/2019 0254     Coagulation Profile: Recent Labs  Lab 02/04/19 0904  INR 1.3*    Cardiac Enzymes: No results for input(s): CKTOTAL, CKMB, CKMBINDEX, TROPONINI in the last 168 hours.  HbA1C: Hgb A1c MFr Bld  Date/Time Value Ref Range Status  02/04/2019 09:04 AM 10.1 (H) 4.8 - 5.6 % Final    Comment:    (NOTE) Pre diabetes:          5.7%-6.4% Diabetes:              >6.4% Glycemic control for   <7.0% adults with diabetes     CBG: Recent Labs  Lab 02/05/19 0550 02/05/19 0651 02/05/19 0751 02/05/19 0854 02/05/19 1131  GLUCAP 162* 156* 309* 213* 132*    Review of Systems:   History of coronary artery disease Hypertension But she is on Metroprolol Cardizem and potassium and Lasix.  Past Medical History  She,  has a past medical history of Coronary artery disease, Diabetes mellitus  without complication (HCC), and Hypertension.   Surgical History    Past Surgical History:  Procedure Laterality Date  . CORONARY ANGIOPLASTY WITH STENT PLACEMENT       Social History   reports that she has never smoked. She has never used smokeless tobacco. She reports that she does not drink alcohol or use drugs.   Family History   Her family history is not on file.   Allergies No Known Allergies   Home Medications  Prior to Admission medications   Not on File    The patient is critically ill with multiple organ systems failure and requires high complexity decision making for assessment and support, frequent evaluation and titration of therapies, application of advanced monitoring technologies and extensive interpretation of multiple databases.   Critical Care Time devoted to patient care services described in this note is  33  Minutes. This time reflects time of care of this signee Dr Koren Bound. This critical care time does not reflect procedure time, or teaching time or supervisory time of PA/NP/Med student/Med Resident etc but could involve care discussion time.  Alyson Reedy, M.D. [facility] Pulmonary/Critical Care Medicine. Pager: [phone]. After hours pager: [phone].

## 2019-02-05 NOTE — Progress Notes (Signed)
Initial Nutrition Assessment  DOCUMENTATION CODES:   Not applicable  INTERVENTION:    Vital AF 1.2 at 55 ml/h (1320 ml per day)  Pro-stat 30 ml BID  Provides 1784 kcal, 129 gm protein, 1071 ml free water daily  NUTRITION DIAGNOSIS:   Inadequate oral intake related to inability to eat as evidenced by NPO status.  GOAL:   Patient will meet greater than or equal to 90% of their needs  MONITOR:   Vent status, TF tolerance, Labs, I & O's  REASON FOR ASSESSMENT:   Ventilator, Consult Enteral/tube feeding initiation and management  ASSESSMENT:   73 yo female with PMH of DM-2, HTN, CAD from [place] who was admitted with fever and respiratory failure requiring intubation.   Received MD Consult for TF initiation and management. OGT in place. Group A Strep positive. Novel coronavirus lab pending.    Patient is currently intubated on ventilator support MV: 9.5 L/min Temp (24hrs), Avg:99.5 F (37.5 C), Min:98.1 F (36.7 C), Max:101.1 F (38.4 C)   Labs reviewed. Creatinine 1.43 (H), magnesium 1.4 (L) CBG's: [phone] Medications reviewed and include novolog (insulin drip), levemir, levophed, penicillin.   NUTRITION - FOCUSED PHYSICAL EXAM:  unable to complete at this time  Diet Order:   Diet Order            Diet NPO time specified  Diet effective now              EDUCATION NEEDS:   No education needs have been identified at this time  Skin:  Skin Assessment: Reviewed RN Assessment  Last BM:  None documented since admission  Height:   Ht Readings from Last 1 Encounters:  02/04/19 5\' 6"  (1.676 m)    Weight:   Wt Readings from Last 1 Encounters:  02/05/19 80.7 kg    Ideal Body Weight:  59.1 kg  BMI:  Body mass index is 28.72 kg/m.  Estimated Nutritional Needs:   Kcal:  1780  Protein:  115-130 gm  Fluid:  1.8 L    Joaquin Courts, RD, LDN, CNSC Pager [phone] After Hours Pager [phone]

## 2019-02-06 ENCOUNTER — Inpatient Hospital Stay (HOSPITAL_COMMUNITY): Payer: Medicaid Other

## 2019-02-06 DIAGNOSIS — J9600 Acute respiratory failure, unspecified whether with hypoxia or hypercapnia: Secondary | ICD-10-CM

## 2019-02-06 DIAGNOSIS — J810 Acute pulmonary edema: Secondary | ICD-10-CM

## 2019-02-06 DIAGNOSIS — A400 Sepsis due to streptococcus, group A: Principal | ICD-10-CM

## 2019-02-06 DIAGNOSIS — R6520 Severe sepsis without septic shock: Secondary | ICD-10-CM

## 2019-02-06 DIAGNOSIS — R579 Shock, unspecified: Secondary | ICD-10-CM

## 2019-02-06 LAB — GLUCOSE, CAPILLARY
GLUCOSE-CAPILLARY: 400 mg/dL — AB (ref 70–99)
GLUCOSE-CAPILLARY: 307 mg/dL — AB (ref 70–99)
GLUCOSE-CAPILLARY: 253 mg/dL — AB (ref 70–99)
Glucose-Capillary: 312 mg/dL — ABNORMAL HIGH (ref 70–99)
Glucose-Capillary: 345 mg/dL — ABNORMAL HIGH (ref 70–99)
Glucose-Capillary: 373 mg/dL — ABNORMAL HIGH (ref 70–99)
Glucose-Capillary: 363 mg/dL — ABNORMAL HIGH (ref 70–99)
Glucose-Capillary: >600 mg/dL (ref 70–99)
Glucose-Capillary: 359 mg/dL — ABNORMAL HIGH (ref 70–99)
Glucose-Capillary: 597 mg/dL (ref 70–99)
Glucose-Capillary: 420 mg/dL — ABNORMAL HIGH (ref 70–99)
Glucose-Capillary: 453 mg/dL — ABNORMAL HIGH (ref 70–99)
Glucose-Capillary: 383 mg/dL — ABNORMAL HIGH (ref 70–99)
Glucose-Capillary: 442 mg/dL — ABNORMAL HIGH (ref 70–99)
Glucose-Capillary: 411 mg/dL — ABNORMAL HIGH (ref 70–99)
Glucose-Capillary: 346 mg/dL — ABNORMAL HIGH (ref 70–99)
Glucose-Capillary: 352 mg/dL — ABNORMAL HIGH (ref 70–99)
Glucose-Capillary: 301 mg/dL — ABNORMAL HIGH (ref 70–99)
Glucose-Capillary: 327 mg/dL — ABNORMAL HIGH (ref 70–99)

## 2019-02-06 LAB — CULTURE, BLOOD (ROUTINE X 2)
Special Requests: ADEQUATE
Special Requests: ADEQUATE

## 2019-02-06 LAB — BLOOD GAS, ARTERIAL
Acid-base deficit: 9.4 mmol/L — ABNORMAL HIGH (ref 0.0–2.0)
Acid-base deficit: 9.2 mmol/L — ABNORMAL HIGH (ref 0.0–2.0)
Bicarbonate: 15.6 mmol/L — ABNORMAL LOW (ref 20.0–28.0)
Bicarbonate: 15.4 mmol/L — ABNORMAL LOW (ref 20.0–28.0)
Drawn by: 31101
Drawn by: 244901
FIO2: 0.5
FIO2: 100
MECHVT: 500 mL
MECHVT: 550 mL
O2 Saturation: 95.3 %
O2 Saturation: 98.6 %
PEEP: 5 cmH2O
PEEP: 18 cmH2O
Patient temperature: 98.6
Patient temperature: 99.6
RATE: 18 resp/min
RATE: 24 resp/min
pCO2 arterial: 31.7 mmHg — ABNORMAL LOW (ref 32.0–48.0)
pCO2 arterial: 29.9 mmHg — ABNORMAL LOW (ref 32.0–48.0)
pH, Arterial: 7.313 — ABNORMAL LOW (ref 7.350–7.450)
pH, Arterial: 7.335 — ABNORMAL LOW (ref 7.350–7.450)
pO2, Arterial: 89.3 mmHg (ref 83.0–108.0)
pO2, Arterial: 223 mmHg — ABNORMAL HIGH (ref 83.0–108.0)

## 2019-02-06 LAB — POCT I-STAT 7, (LYTES, BLD GAS, ICA,H+H)
Acid-base deficit: 12 mmol/L — ABNORMAL HIGH (ref 0.0–2.0)
BICARBONATE: 18.5 mmol/L — AB (ref 20.0–28.0)
Calcium, Ion: 1.12 mmol/L — ABNORMAL LOW (ref 1.15–1.40)
HCT: 39 % (ref 36.0–46.0)
Hemoglobin: 13.3 g/dL (ref 12.0–15.0)
O2 Saturation: 33 %
Potassium: 4.8 mmol/L (ref 3.5–5.1)
Sodium: 136 mmol/L (ref 135–145)
TCO2: 20 mmol/L — ABNORMAL LOW (ref 22–32)
pCO2 arterial: 61.3 mmHg — ABNORMAL HIGH (ref 32.0–48.0)
pH, Arterial: 7.087 — CL (ref 7.350–7.450)
pO2, Arterial: 28 mmHg — CL (ref 83.0–108.0)

## 2019-02-06 LAB — BASIC METABOLIC PANEL
Anion gap: 4 — ABNORMAL LOW (ref 5–15)
BUN: 24 mg/dL — ABNORMAL HIGH (ref 8–23)
CO2: 18 mmol/L — ABNORMAL LOW (ref 22–32)
Calcium: 7.1 mg/dL — ABNORMAL LOW (ref 8.9–10.3)
Chloride: 111 mmol/L (ref 98–111)
Creatinine, Ser: 1.35 mg/dL — ABNORMAL HIGH (ref 0.44–1.00)
GFR calc Af Amer: 45 mL/min — ABNORMAL LOW (ref >60)
GFR calc non Af Amer: 39 mL/min — ABNORMAL LOW (ref >60)
Glucose, Bld: 391 mg/dL — ABNORMAL HIGH (ref 70–99)
Potassium: 5.3 mmol/L — ABNORMAL HIGH (ref 3.5–5.1)
SODIUM: 133 mmol/L — AB (ref 135–145)

## 2019-02-06 LAB — CBC
HEMATOCRIT: 34.2 % — AB (ref 36.0–46.0)
Hemoglobin: 10 g/dL — ABNORMAL LOW (ref 12.0–15.0)
MCH: 24.3 pg — ABNORMAL LOW (ref 26.0–34.0)
MCHC: 29.2 g/dL — ABNORMAL LOW (ref 30.0–36.0)
MCV: 83.2 fL (ref 80.0–100.0)
Platelets: 181 10*3/uL (ref 150–400)
RBC: 4.11 MIL/uL (ref 3.87–5.11)
RDW: 17.7 % — ABNORMAL HIGH (ref 11.5–15.5)
WBC: 20.2 10*3/uL — ABNORMAL HIGH (ref 4.0–10.5)
nRBC: 0 % (ref 0.0–0.2)

## 2019-02-06 LAB — CULTURE, RESPIRATORY W GRAM STAIN: Special Requests: NORMAL

## 2019-02-06 LAB — URINE CULTURE: Culture: NO GROWTH

## 2019-02-06 LAB — PHOSPHORUS: Phosphorus: 3 mg/dL (ref 2.5–4.6)

## 2019-02-06 LAB — CULTURE, RESPIRATORY: CULTURE: NORMAL

## 2019-02-06 LAB — MAGNESIUM: Magnesium: 2.6 mg/dL — ABNORMAL HIGH (ref 1.7–2.4)

## 2019-02-06 MED ORDER — CHLORHEXIDINE GLUCONATE 0.12 % MT SOLN
OROMUCOSAL | Status: AC
  Filled 2019-02-06: qty 15

## 2019-02-06 MED ORDER — PENICILLIN G POTASSIUM 20000000 UNITS IJ SOLR
8.0000 10*6.[IU] | Freq: Two times a day (BID) | INTRAVENOUS | Status: DC
  Administered 2019-02-06 – 2019-02-14 (×15): 8 10*6.[IU] via INTRAVENOUS
  Filled 2019-02-06 (×20): qty 8

## 2019-02-06 MED ORDER — FUROSEMIDE 10 MG/ML IJ SOLN
40.0000 mg | Freq: Four times a day (QID) | INTRAMUSCULAR | Status: AC
  Administered 2019-02-06 – 2019-02-07 (×3): 40 mg via INTRAVENOUS
  Filled 2019-02-06 (×3): qty 4

## 2019-02-06 MED ORDER — INSULIN REGULAR(HUMAN) IN NACL 100-0.9 UT/100ML-% IV SOLN
INTRAVENOUS | Status: DC
  Administered 2019-02-06: 17.6 [IU]/h via INTRAVENOUS
  Administered 2019-02-06: 5.4 [IU]/h via INTRAVENOUS
  Administered 2019-02-07: 23 [IU]/h via INTRAVENOUS
  Administered 2019-02-07: 21.9 [IU]/h via INTRAVENOUS
  Administered 2019-02-07: 27.9 [IU]/h via INTRAVENOUS
  Administered 2019-02-07: 19.3 [IU]/h via INTRAVENOUS
  Administered 2019-02-07: 18.1 [IU]/h via INTRAVENOUS
  Administered 2019-02-08: 8.4 [IU]/h via INTRAVENOUS
  Filled 2019-02-06 (×11): qty 100

## 2019-02-06 MED ORDER — FUROSEMIDE 10 MG/ML IJ SOLN
INTRAMUSCULAR | Status: AC
  Administered 2019-02-06: 80 mg
  Filled 2019-02-06: qty 8

## 2019-02-06 MED ORDER — PENICILLIN G POTASSIUM 20000000 UNITS IJ SOLR
8.0000 10*6.[IU] | Freq: Two times a day (BID) | INTRAVENOUS | Status: DC
  Administered 2019-02-06: 8 10*6.[IU] via INTRAVENOUS
  Filled 2019-02-06 (×2): qty 8

## 2019-02-06 MED ORDER — LABETALOL HCL 5 MG/ML IV SOLN
INTRAVENOUS | Status: AC
  Administered 2019-02-06: 20 mg
  Filled 2019-02-06: qty 4

## 2019-02-06 MED ORDER — FUROSEMIDE 10 MG/ML IJ SOLN: 40.0000 mg | Freq: Four times a day (QID) | INTRAMUSCULAR | Status: DC

## 2019-02-06 NOTE — Progress Notes (Signed)
NAME:  Gabrielle Wright, MRN:  [id number], DOB:  Aug 11, 1946, LOS: 2 ADMISSION DATE:  02/03/2019, CONSULTATION DATE: 02/04/2019 REFERRING MD: Emergency department physician [email or web address] COMPLAINT: Fever respiratory failure  Brief History   73 year old female refractory hypotension and respiratory distress required intubation on 02/04/2019  History of present illness   73 year old female is visiting from [place].  Noted to have a high fever 103.9.  Reported to have cough.  Taken to [facility] med [facility] per family.  Was supported to have a urinary tract infection although her urine was clean.  She never filled her prescription for antibiotics.  Due to persistent hypotension and refractory nature of her hypertension she is transferred to [facility] 02/04/2019.  She was in respiratory distress for abdominal chest wall paradoxus.  She required intubation and central line placement.  She had maxed out on peripheral IV vasopressors therefore central line was placed.  Pulmonary critical care will be responsible for care at this time.  She was placed in isolation since she came from a [place] entry of [place] with recent reports of coronavirus and that country.  Note [place] is not on the list of infected countries.  Infectious disease was called and they reported again diagnosis done on 1 reported countries.  Past Medical History  Hypertension Coronary artery disease Suspected diabetes review of her arterial consult receiving correct.  Significant Hospital IsEvents   02/04/2019 transfer from [facility] to [facility] required intubation and pressors.  Consults:  02/04/2019 ID called>>  Procedures:  02/04/2019 intubation>> 02/04/2019 and central line placement>>  Significant Diagnostic Tests:  Patient 2020 chest x-ray essentially clear  Micro Data:  02/04/2019 blood cultures x2>> 02/04/2019 sputum culture>> 02/04/2019 urine culture>> 02/04/2019 respiratory virus panel>> 02/04/2019 flu a  and B>>  Possible coronavirus testing 02/04/2019>>   Antimicrobials:  02/04/2019 vancomycin>> 02/04/2019 Zosyn>>  Interim history/subjective:  Severe hypoxemia and pulmonary edema this AM Severe respiratory acidosis  Objective   Blood pressure (!) 99/55, pulse 79, temperature 99.6 F (37.6 C), temperature source Oral, resp. rate (!) 24, height 5\' 6"  (1.676 m), weight 85.5 kg, SpO2 100 %.    Vent Mode: PRVC FiO2 (%):  [30 %-100 %] 100 % Set Rate:  [18 bmp-24 bmp] 24 bmp Vt Set:  [500 mL-550 mL] 550 mL PEEP:  [5 cmH20-18 cmH20] 18 cmH20 Plateau Pressure:  [16 cmH20-25 cmH20] 25 cmH20   Intake/Output Summary (Last 24 hours) at 02/06/2019 1147 Last data filed at 02/06/2019 1000 Gross per 24 hour  Intake 4753.59 ml  Output 935 ml  Net 3818.59 ml   Filed Weights   02/05/19 0045 02/05/19 0146 02/06/19 0500  Weight: 80.7 kg 80.7 kg 85.5 kg    Examination: General: Acutely ill appearing female, sedate HENT: [place]/AT, PERRL, EOM-I and MMM Lungs: Crackles diffusely Cardiovascular: RRR, Nl S1/S2 and -M/R/G Abdomen: Soft, obese, NT, ND and +BS Extremities: Warm and dry without edema Neuro: Sedate, withdraws all ext to pain  I reviewed CXR myself, pulmonary edema noted and ETT is in a good position  Resolved Hospital Problem list     Assessment & Plan:  Hypotension in the setting of fever with continuation of diuretics and potassium supplements and antihypertensives. - KVO IVF - Levophed for BP support - TLC and CVP monitoring - Empirical antimicrobial therapy - Viral testing empirically negative for COVID, positive for rhino and entero  Acute respiratory distress in the setting of fever 103.9 with clear chest x-ray.  Obvious abdominal chest wall paradoxus prior  to intubation - Adjust vent for ABG - F/U gas at 1 PM - Titrate O2 for sat of 88-92% - BD PRN - F/U on corona virus - ABG and CXR in AM - Bronch done today, edema noted   Suspected viral infection cannot rule out  coronavirus at this time. - Isolation d/ced since COVID is negative - IP and ID following, appreciate input - Contact infectious disease for possible testing with coronavirus - Respiratory virus panel - Flu a/b is being checked  Acute renal insufficiency Lab Results  Component Value Date   CREATININE 1.35 (H) 02/06/2019   CREATININE 1.43 (H) 02/05/2019   CREATININE 1.59 (H) 02/04/2019  In the setting of chronic diuresis therapy ingestion of medications while sick. - Monitor renal function - Avoid nephrotoxins - Lasix 40 mg IV q6 x3 doses  Hyperkalemia Recent Labs  Lab 02/05/19 0739 02/06/19 0549 02/06/19 0912  K 4.0 5.3* 4.8   - Monitor potassium - Monitor renal function - Stop potassium supplement - Lasix - BMET in AM  History of coronary disease, hypertension - Check twelve-lead for completeness  Hyperglycemia CBG (last 3)  Recent Labs    02/06/19 0737 02/06/19 1109 02/06/19 1138  GLUCAP 363* 359* 597*   - Sliding scale insulin - Check hemoglobin A1c  PCCM will continue to manage  Best practice:  Diet:NPO Pain/Anxiety/Delirium protocol (if indicated): Sedation protocol VAP protocol (if indicated): Y DVT prophylaxis: Y GI prophylaxis: Y Glucose control: SSI Mobility: BR Code Status: full Family Communication: NO family at bedside Disposition: ICU  Labs   CBC: Recent Labs  Lab 02/03/19 2354 02/04/19 0904 02/04/19 0928 02/05/19 0254 02/05/19 0739 02/06/19 0421 02/06/19 0912  WBC 10.3 46.3*  --   --  21.3* 20.2*  --   NEUTROABS 9.5*  --   --   --   --   --   --   HGB 11.6* 10.7* 11.2* 10.5* 10.1* 10.0* 13.3  HCT 38.9 36.1 33.0* 31.0* 33.3* 34.2* 39.0  MCV 81.0 82.0  --   --  81.4 83.2  --   PLT 187 236  --   --  167 181  --     Basic Metabolic Panel: Recent Labs  Lab 02/04/19 0904  02/04/19 1758 02/04/19 2200 02/05/19 0254 02/05/19 0739 02/06/19 0549 02/06/19 0912  NA 131*   < > 133* 138 141 139 133* 136  K 7.1*   < > 6.8* 4.4  4.1 4.0 5.3* 4.8  CL 106  --  110 113*  --  111 111  --   CO2 18*  --  18* 20*  --  19* 18*  --   GLUCOSE 401*  --  429* 249*  --  167* 391*  --   BUN 29*  --  30* 27*  --  21 24*  --   CREATININE 1.75*  --  1.77* 1.59*  --  1.43* 1.35*  --   CALCIUM 7.0*  --  6.9* 7.2*  --  7.1* 7.1*  --   MG 1.2*  --  1.6*  --   --  1.4* 2.6*  --   PHOS 2.8  --  3.4  --   --  2.6 3.0  --    < > = values in this interval not displayed.   GFR: Estimated Creatinine Clearance: 41.5 mL/min (A) (by C-G formula based on SCr of 1.35 mg/dL (H)). Recent Labs  Lab 02/03/19 2354 02/04/19 0254 02/04/19 2426 02/05/19 0739 02/06/19 0421  PROCALCITON  --   --  20.03  --   --   WBC 10.3  --  46.3* 21.3* 20.2*  LATICACIDVEN 1.9 1.7  --   --   --     Liver Function Tests: Recent Labs  Lab 02/03/19 2354 02/04/19 0904  AST 29 33  ALT 16 17  ALKPHOS 66 53  BILITOT 0.5 1.2  PROT 7.6 5.8*  ALBUMIN 4.0 2.8*   No results for input(s): LIPASE, AMYLASE in the last 168 hours. No results for input(s): AMMONIA in the last 168 hours.  ABG    Component Value Date/Time   PHART 7.087 (LL) 02/06/2019 0912   PCO2ART 61.3 (H) 02/06/2019 0912   PO2ART 28.0 (LL) 02/06/2019 0912   HCO3 18.5 (L) 02/06/2019 0912   TCO2 20 (L) 02/06/2019 0912   ACIDBASEDEF 12.0 (H) 02/06/2019 0912   O2SAT 33.0 02/06/2019 0912     Coagulation Profile: Recent Labs  Lab 02/04/19 0904  INR 1.3*    Cardiac Enzymes: No results for input(s): CKTOTAL, CKMB, CKMBINDEX, TROPONINI in the last 168 hours.  HbA1C: Hgb A1c MFr Bld  Date/Time Value Ref Range Status  02/04/2019 09:04 AM 10.1 (H) 4.8 - 5.6 % Final    Comment:    (NOTE) Pre diabetes:          5.7%-6.4% Diabetes:              >6.4% Glycemic control for   <7.0% adults with diabetes     CBG: Recent Labs  Lab 02/06/19 0419 02/06/19 0735 02/06/19 0737 02/06/19 1109 02/06/19 1138  GLUCAP 345* >600* 363* 359* 597*    Review of Systems:   History of coronary artery  disease Hypertension But she is on Metroprolol Cardizem and potassium and Lasix.  Past Medical History  She,  has a past medical history of Coronary artery disease, Diabetes mellitus without complication (HCC), and Hypertension.   Surgical History    Past Surgical History:  Procedure Laterality Date  . CORONARY ANGIOPLASTY WITH STENT PLACEMENT       Social History   reports that she has never smoked. She has never used smokeless tobacco. She reports that she does not drink alcohol or use drugs.   Family History   Her family history is not on file.   Allergies No Known Allergies   Home Medications  Prior to Admission medications   Not on File    Family updated over the phone then bedside.  The patient is critically ill with multiple organ systems failure and requires high complexity decision making for assessment and support, frequent evaluation and titration of therapies, application of advanced monitoring technologies and extensive interpretation of multiple databases.   Critical Care Time devoted to patient care services described in this note is  95  Minutes. This time reflects time of care of this signee Dr Koren Bound. This critical care time does not reflect procedure time, or teaching time or supervisory time of PA/NP/Med student/Med Resident etc but could involve care discussion time.  Alyson Reedy, M.D. [facility] Pulmonary/Critical Care Medicine. Pager: [phone]. After hours pager: [phone].

## 2019-02-06 NOTE — Progress Notes (Signed)
[facility]   Reason for visit: Follow up on bacteremia  Interval History: remains intubated, sedated.  WBC down to 20.2, afebrile > 24 hours.  Repeat blood cultures ngtd.    CXR independently reviewed and a significant amount of edema/opacity noted  Physical Exam: Constitutional:  Vitals:   02/06/19 1134 02/06/19 1200  BP:  127/61  Pulse:  81  Resp:  (!) 24  Temp: 99.6 F (37.6 C)   SpO2:  100%   patient intubated, sedated Eyes: anicteric HENT: +ET Respiratory: respiratory effort on vent; diffuse rhonchi Cardiovascular: tachy RR GI: soft, nt, nd  Review of Systems: Unable to be assessed due to patient factors  Lab Results  Component Value Date   WBC 20.2 (H) 02/06/2019   HGB 13.3 02/06/2019   HCT 39.0 02/06/2019   MCV 83.2 02/06/2019   PLT 181 02/06/2019    Lab Results  Component Value Date   CREATININE 1.35 (H) 02/06/2019   BUN 24 (H) 02/06/2019   NA 136 02/06/2019   K 4.8 02/06/2019   CL 111 02/06/2019   CO2 18 (L) 02/06/2019    Lab Results  Component Value Date   ALT 17 02/04/2019   AST 33 02/04/2019   ALKPHOS 53 02/04/2019     Microbiology: Recent Results (from the past 240 hour(s))  Culture, blood (Routine x 2)     Status: Abnormal   Collection Time: 02/03/19 11:54 PM  Result Value Ref Range Status   Specimen Description   Final    BLOOD RIGHT FOREARM Performed at [facility], [address] [place], [place] [address]    Special Requests   Final    BOTTLES DRAWN AEROBIC AND ANAEROBIC Blood Culture adequate volume Performed at [facility], [address] [place], [place] [address]    Culture  Setup Time   Final    GRAM POSITIVE COCCI IN BOTH AEROBIC AND ANAEROBIC BOTTLES CRITICAL RESULT CALLED TO, READ BACK BY AND VERIFIED WITH: A MASTERS PHARMD 2204 02/04/19 A BROWNING    Culture (A)  Final    GROUP A STREP (S.PYOGENES) ISOLATED HEALTH DEPARTMENT NOTIFIED Performed at [facility], [address], [place], [place] [address]    Report Status 02/06/2019 FINAL  Final   Organism ID, Bacteria GROUP A STREP (S.PYOGENES) ISOLATED  Final      Susceptibility   Group a strep (s.pyogenes) isolated - MIC*    PENICILLIN <=0.06 SENSITIVE Sensitive     CEFTRIAXONE <=0.12 SENSITIVE Sensitive     ERYTHROMYCIN <=0.12 SENSITIVE Sensitive     LEVOFLOXACIN 4 INTERMEDIATE Intermediate     VANCOMYCIN 0.5 SENSITIVE Sensitive     * GROUP A STREP (S.PYOGENES) ISOLATED  Blood Culture ID Panel (Reflexed)     Status: Abnormal   Collection Time: 02/03/19 11:54 PM  Result Value Ref Range Status   Enterococcus species NOT DETECTED NOT DETECTED Final   Listeria monocytogenes NOT DETECTED NOT DETECTED Final   Staphylococcus species NOT DETECTED NOT DETECTED Final   Staphylococcus aureus (BCID) NOT DETECTED NOT DETECTED Final   Streptococcus species DETECTED (A) NOT DETECTED Final    Comment: CRITICAL RESULT CALLED TO, READ BACK BY AND VERIFIED WITH: A MASTERS PHARMD 2204 02/04/19 A BROWNING    Streptococcus agalactiae NOT DETECTED NOT DETECTED Final   Streptococcus pneumoniae NOT DETECTED NOT DETECTED Final   Streptococcus pyogenes DETECTED (A) NOT DETECTED Final    Comment: CRITICAL RESULT CALLED  TO, READ BACK BY AND VERIFIED WITH: A MASTERS PHARMD 2204 02/04/19 A BROWNING    Acinetobacter baumannii NOT DETECTED NOT DETECTED Final   Enterobacteriaceae species NOT DETECTED NOT DETECTED Final   Enterobacter cloacae complex NOT DETECTED NOT DETECTED Final   Escherichia coli NOT DETECTED NOT DETECTED Final   Klebsiella oxytoca NOT DETECTED NOT DETECTED Final   Klebsiella pneumoniae NOT DETECTED NOT DETECTED Final   Proteus species NOT DETECTED NOT DETECTED Final   Serratia marcescens NOT DETECTED NOT DETECTED Final   Haemophilus influenzae NOT DETECTED NOT DETECTED Final   Neisseria meningitidis NOT DETECTED NOT DETECTED Final   Pseudomonas aeruginosa NOT DETECTED NOT DETECTED Final    Candida albicans NOT DETECTED NOT DETECTED Final   Candida glabrata NOT DETECTED NOT DETECTED Final   Candida krusei NOT DETECTED NOT DETECTED Final   Candida parapsilosis NOT DETECTED NOT DETECTED Final   Candida tropicalis NOT DETECTED NOT DETECTED Final    Comment: Performed at [facility], [address], [place], [place] [address]  Culture, blood (Routine x 2)     Status: Abnormal   Collection Time: 02/04/19 12:12 AM  Result Value Ref Range Status   Specimen Description   Final    BLOOD RIGHT WRIST Performed at [facility], [address] [place], [place] [address]    Special Requests   Final    BOTTLES DRAWN AEROBIC AND ANAEROBIC Blood Culture adequate volume Performed at [facility], [address] [place], [place] [address]    Culture  Setup Time   Final    GRAM POSITIVE COCCI IN BOTH AEROBIC AND ANAEROBIC BOTTLES CRITICAL RESULT CALLED TO, READ BACK BY AND VERIFIED WITH: A MASTERS PHARMD 2204 02/04/19 A BROWNING    Culture (A)  Final    GROUP A STREP (S.PYOGENES) ISOLATED SUSCEPTIBILITIES PERFORMED ON PREVIOUS CULTURE WITHIN THE LAST 5 DAYS. HEALTH DEPARTMENT NOTIFIED Performed at [facility], [address], [place], [place] [address]    Report Status 02/06/2019 FINAL  Final  Urine culture     Status: None   Collection Time: 02/04/19  1:23 AM  Result Value Ref Range Status   Specimen Description   Final    URINE, CATHETERIZED Performed at [facility], [address] [place], [place] [address]    Special Requests   Final    NONE Performed at [facility], [address] [place], [place] [address]    Culture   Final    NO GROWTH Performed at [facility], [address] [place], [place] [address]    Report Status 02/05/2019 FINAL  Final  Respiratory Panel by PCR     Status: Abnormal   Collection Time: 02/04/19  6:36 AM  Result Value Ref Range Status   Adenovirus NOT DETECTED NOT DETECTED  Final   Coronavirus 229E NOT DETECTED NOT DETECTED Final    Comment: (NOTE) The Coronavirus on the Respiratory Panel, DOES NOT test for the novel  Coronavirus (2019 nCoV)    Coronavirus HKU1 NOT DETECTED NOT DETECTED Final   Coronavirus NL63 NOT DETECTED NOT DETECTED Final   Coronavirus OC43 NOT DETECTED NOT DETECTED Final   Metapneumovirus NOT DETECTED NOT DETECTED Final   Rhinovirus / Enterovirus DETECTED (A) NOT DETECTED Final   Influenza A NOT DETECTED NOT DETECTED Final   Influenza B NOT DETECTED NOT DETECTED Final   Parainfluenza Virus 1 NOT DETECTED NOT DETECTED Final  Parainfluenza Virus 2 NOT DETECTED NOT DETECTED Final   Parainfluenza Virus 3 NOT DETECTED NOT DETECTED Final   Parainfluenza Virus 4 NOT DETECTED NOT DETECTED Final   Respiratory Syncytial Virus NOT DETECTED NOT DETECTED Final   Bordetella pertussis NOT DETECTED NOT DETECTED Final   Chlamydophila pneumoniae NOT DETECTED NOT DETECTED Final   Mycoplasma pneumoniae NOT DETECTED NOT DETECTED Final    Comment: Performed at [facility], [address], [place], [place] [address]  MRSA PCR Screening     Status: None   Collection Time: 02/04/19  6:36 AM  Result Value Ref Range Status   MRSA by PCR NEGATIVE NEGATIVE Final    Comment:        The GeneXpert MRSA Assay (FDA approved for NASAL specimens only), is one component of a comprehensive MRSA colonization surveillance program. It is not intended to diagnose MRSA infection nor to guide or monitor treatment for MRSA infections. Performed at [facility], [address], [place], [place] [address]   Culture, blood (routine x 2)     Status: None (Preliminary result)   Collection Time: 02/04/19  8:50 AM  Result Value Ref Range Status   Specimen Description BLOOD CENTRAL LINE  Final   Special Requests   Final    BOTTLES DRAWN AEROBIC ONLY Blood Culture adequate volume   Culture   Final    NO GROWTH 2 DAYS Performed at [facility],  [address], [place], [place] [address]    Report Status PENDING  Incomplete  Culture, blood (routine x 2)     Status: None (Preliminary result)   Collection Time: 02/04/19  8:55 AM  Result Value Ref Range Status   Specimen Description BLOOD CENTRAL LINE  Final   Special Requests   Final    BOTTLES DRAWN AEROBIC ONLY Blood Culture adequate volume   Culture   Final    NO GROWTH 2 DAYS Performed at [facility], [address] [place], [place] [address]    Report Status PENDING  Incomplete  Culture, respiratory (non-expectorated)     Status: None   Collection Time: 02/04/19  9:22 AM  Result Value Ref Range Status   Specimen Description TRACHEAL ASPIRATE  Final   Special Requests Normal  Final   Gram Stain   Final    MODERATE WBC PRESENT, PREDOMINANTLY PMN RARE SQUAMOUS EPITHELIAL CELLS PRESENT NO ORGANISMS SEEN    Culture   Final    RARE Consistent with normal respiratory flora. Performed at [facility], [address], [place], [place] [address]    Report Status 02/06/2019 FINAL  Final  Urine culture     Status: None   Collection Time: 02/04/19  3:40 PM  Result Value Ref Range Status   Specimen Description URINE, RANDOM  Final   Special Requests ADDED ON 02/04/19 AT 2146  Final   Culture   Final    NO GROWTH Performed at [facility], [address], [place], [place] [address]    Report Status 02/06/2019 FINAL  Final    Impression/Plan:  1. GAS bacteremia - remains on penicillin.  No changes.    2.  Respiratory failure - remains intubated.  Increased edema and diuresing today.  3.  Viral illness - rhinovirus positive.  COVID-19 negative by report from micro

## 2019-02-06 NOTE — Progress Notes (Signed)
ETT was placed at 22 cm at the lip per verbal order of MD.

## 2019-02-06 NOTE — Progress Notes (Signed)
Spoke with infection prevention, per Jason Fila, Covid-19 is negative and that patient can come off precautions at this time.

## 2019-02-06 NOTE — Progress Notes (Addendum)
   Inpatient Diabetes Program Recommendations  AACE/ADA: New Consensus Statement on Inpatient Glycemic Control (2015)  Target Ranges:  Prepandial:   less than 140 mg/dL      Peak postprandial:   less than 180 mg/dL (1-2 hours)      Critically ill patients:  140 - 180 mg/dL   Lab Results  Component Value Date   GLUCAP 359 (H) 02/06/2019   HGBA1C 10.1 (H) 02/04/2019   Results for IMA, LAPIANA (MRN [id number]) as of 02/06/2019 11:01  Ref. Range 02/05/2019 15:04 02/05/2019 19:35 02/06/2019 00:13 02/06/2019 04:19 02/06/2019 07:35 02/06/2019 07:37 02/06/2019 11:09  Glucose-Capillary Latest Ref Range: 70 - 99 mg/dL 921 (H) 194 (H) 174 (H) 345 (H) >600 (HH) 363 (H) 359 (H)    Patient transitioned off drip yesterday am and Levemir 8 units Q 12 hours and Novolog 2-6 units Q 4 hours (per ICU Glycemic Control phase 1 protocol) ordered. Blood glucose trending towards goal.  Last night orders changed and basal insulin d/c and taken off of ICU order set and placed on glycemic control order set (Novolog moderate scale (0-15 units) Q 4 hours. CBG in 300's today.   Spoke to patient's RN and discussed need for patient to return to ICU glycemic control order set (ideally phase 2 with iv insulin). If not, then ICU glycemic protocol phase 1 as ordered yesterday (Nov 2-6 units Q 4 hours) AND Levemir 8 units Q 12 hours.   RN is contacting physician for above order.   Update - iv insulin ordered.   -- Will follow during hospitalization.--  Jamelle Rushing RN, MSN Diabetes Coordinator Inpatient Glycemic Control Team Team Pager: [phone] (8am-5pm)

## 2019-02-06 NOTE — Procedures (Signed)
Bronchoscopy Procedure Note Gabrielle Wright [id number] 06/16/1946  Procedure: Bronchoscopy Indications: Diagnostic evaluation of the airways  Procedure Details Consent: Unable to obtain consent because of emergent medical necessity. Time Out: Verified patient identification, verified procedure, site/side was marked, verified correct patient position, special equipment/implants available, medications/allergies/relevent history reviewed, required imaging and test results available.  Performed  In preparation for procedure, patient was given 100% FiO2 and bronchoscope lubricated. Sedation: Benzodiazepines and Fentanyl  Airway entered and the following bronchi were examined: RUL, RML, RLL, LUL, LLL and Bronchi.   Diffuse pink frothy secretions noted Bronchoscope removed.  , Patient placed back on 100% FiO2 at conclusion of procedure.    Evaluation Hemodynamic Status: BP stable throughout; O2 sats: stable throughout Patient's Current Condition: stable Specimens:  None Complications: No apparent complications Patient did tolerate procedure well.   Gabrielle Wright 02/06/2019

## 2019-02-07 ENCOUNTER — Inpatient Hospital Stay (HOSPITAL_COMMUNITY): Payer: Medicaid Other

## 2019-02-07 LAB — BASIC METABOLIC PANEL
ANION GAP: 7 (ref 5–15)
BUN: 27 mg/dL — ABNORMAL HIGH (ref 8–23)
CO2: 19 mmol/L — ABNORMAL LOW (ref 22–32)
Calcium: 7.7 mg/dL — ABNORMAL LOW (ref 8.9–10.3)
Chloride: 110 mmol/L (ref 98–111)
Creatinine, Ser: 1.44 mg/dL — ABNORMAL HIGH (ref 0.44–1.00)
GFR calc Af Amer: 42 mL/min — ABNORMAL LOW (ref >60)
GFR calc non Af Amer: 36 mL/min — ABNORMAL LOW (ref >60)
GLUCOSE: 254 mg/dL — AB (ref 70–99)
Potassium: 3 mmol/L — ABNORMAL LOW (ref 3.5–5.1)
Sodium: 136 mmol/L (ref 135–145)

## 2019-02-07 LAB — BLOOD GAS, ARTERIAL
Acid-base deficit: 5.7 mmol/L — ABNORMAL HIGH (ref 0.0–2.0)
Bicarbonate: 17.4 mmol/L — ABNORMAL LOW (ref 20.0–28.0)
Drawn by: 31101
FIO2: 60
MECHVT: 550 mL
O2 Saturation: 98.9 %
PEEP: 14 cmH2O
Patient temperature: 98.6
RATE: 24 resp/min
pCO2 arterial: 24.2 mmHg — ABNORMAL LOW (ref 32.0–48.0)
pH, Arterial: 7.47 — ABNORMAL HIGH (ref 7.350–7.450)
pO2, Arterial: 175 mmHg — ABNORMAL HIGH (ref 83.0–108.0)

## 2019-02-07 LAB — GLUCOSE, CAPILLARY
GLUCOSE-CAPILLARY: 309 mg/dL — AB (ref 70–99)
GLUCOSE-CAPILLARY: 349 mg/dL — AB (ref 70–99)
GLUCOSE-CAPILLARY: 279 mg/dL — AB (ref 70–99)
Glucose-Capillary: 163 mg/dL — ABNORMAL HIGH (ref 70–99)
Glucose-Capillary: 174 mg/dL — ABNORMAL HIGH (ref 70–99)
Glucose-Capillary: 185 mg/dL — ABNORMAL HIGH (ref 70–99)
Glucose-Capillary: 205 mg/dL — ABNORMAL HIGH (ref 70–99)
Glucose-Capillary: 213 mg/dL — ABNORMAL HIGH (ref 70–99)
Glucose-Capillary: 232 mg/dL — ABNORMAL HIGH (ref 70–99)
Glucose-Capillary: 235 mg/dL — ABNORMAL HIGH (ref 70–99)
Glucose-Capillary: 241 mg/dL — ABNORMAL HIGH (ref 70–99)
Glucose-Capillary: 250 mg/dL — ABNORMAL HIGH (ref 70–99)
Glucose-Capillary: 261 mg/dL — ABNORMAL HIGH (ref 70–99)
Glucose-Capillary: 263 mg/dL — ABNORMAL HIGH (ref 70–99)
Glucose-Capillary: 273 mg/dL — ABNORMAL HIGH (ref 70–99)
Glucose-Capillary: 275 mg/dL — ABNORMAL HIGH (ref 70–99)
Glucose-Capillary: 283 mg/dL — ABNORMAL HIGH (ref 70–99)
Glucose-Capillary: 293 mg/dL — ABNORMAL HIGH (ref 70–99)
Glucose-Capillary: 300 mg/dL — ABNORMAL HIGH (ref 70–99)
Glucose-Capillary: 314 mg/dL — ABNORMAL HIGH (ref 70–99)
Glucose-Capillary: 316 mg/dL — ABNORMAL HIGH (ref 70–99)
Glucose-Capillary: 340 mg/dL — ABNORMAL HIGH (ref 70–99)
Glucose-Capillary: 402 mg/dL — ABNORMAL HIGH (ref 70–99)
Glucose-Capillary: 425 mg/dL — ABNORMAL HIGH (ref 70–99)

## 2019-02-07 LAB — CBC
HCT: 30.5 % — ABNORMAL LOW (ref 36.0–46.0)
Hemoglobin: 9.5 g/dL — ABNORMAL LOW (ref 12.0–15.0)
MCH: 24.7 pg — ABNORMAL LOW (ref 26.0–34.0)
MCHC: 31.1 g/dL (ref 30.0–36.0)
MCV: 79.2 fL — ABNORMAL LOW (ref 80.0–100.0)
Platelets: 153 10*3/uL (ref 150–400)
RBC: 3.85 MIL/uL — ABNORMAL LOW (ref 3.87–5.11)
RDW: 17.7 % — ABNORMAL HIGH (ref 11.5–15.5)
WBC: 16.1 10*3/uL — ABNORMAL HIGH (ref 4.0–10.5)
nRBC: 0 % (ref 0.0–0.2)

## 2019-02-07 LAB — PHOSPHORUS: Phosphorus: 1.8 mg/dL — ABNORMAL LOW (ref 2.5–4.6)

## 2019-02-07 LAB — MAGNESIUM: Magnesium: 2.1 mg/dL (ref 1.7–2.4)

## 2019-02-07 MED ORDER — DOCUSATE SODIUM 50 MG/5ML PO LIQD
100.0000 mg | Freq: Two times a day (BID) | ORAL | Status: AC
  Administered 2019-02-07 – 2019-02-08 (×4): 100 mg
  Filled 2019-02-07 (×4): qty 10

## 2019-02-07 MED ORDER — VITAL AF 1.2 CAL PO LIQD
1000.0000 mL | ORAL | Status: DC
  Administered 2019-02-07 – 2019-02-12 (×5): 1000 mL

## 2019-02-07 MED ORDER — POTASSIUM CHLORIDE 20 MEQ/15ML (10%) PO SOLN
40.0000 meq | Freq: Three times a day (TID) | ORAL | Status: AC
  Administered 2019-02-07 (×2): 40 meq
  Filled 2019-02-07 (×2): qty 30

## 2019-02-07 MED ORDER — FUROSEMIDE 10 MG/ML IJ SOLN
10.0000 mg/h | INTRAVENOUS | Status: AC
  Administered 2019-02-07: 10 mg/h via INTRAVENOUS
  Filled 2019-02-07: qty 25

## 2019-02-07 MED ORDER — POTASSIUM CHLORIDE 20 MEQ/15ML (10%) PO SOLN
40.0000 meq | ORAL | Status: AC
  Administered 2019-02-07 (×2): 40 meq
  Filled 2019-02-07 (×2): qty 30

## 2019-02-07 MED ORDER — POTASSIUM PHOSPHATES 15 MMOLE/5ML IV SOLN
30.0000 mmol | Freq: Once | INTRAVENOUS | Status: AC
  Administered 2019-02-07: 30 mmol via INTRAVENOUS
  Filled 2019-02-07: qty 10

## 2019-02-07 MED ORDER — METOLAZONE 5 MG PO TABS
5.0000 mg | ORAL_TABLET | Freq: Every day | ORAL | Status: AC
  Administered 2019-02-07: 5 mg via ORAL
  Filled 2019-02-07: qty 1

## 2019-02-07 MED ORDER — MAGNESIUM HYDROXIDE 400 MG/5ML PO SUSP
30.0000 mL | Freq: Once | ORAL | Status: AC
  Administered 2019-02-07: 30 mL
  Filled 2019-02-07: qty 30

## 2019-02-07 NOTE — Progress Notes (Signed)
Nutrition Follow-up  DOCUMENTATION CODES:   Not applicable  INTERVENTION:   Continue TF via OGT:  Increase Vital AF 1.2 to 65 ml/h (1560 ml per day)  D/C Pro-stat  Provides 1872 kcal, 117 gm protein, 1265 ml free water daily  NUTRITION DIAGNOSIS:   Inadequate oral intake related to inability to eat as evidenced by NPO status.  Ongoing   GOAL:   Patient will meet greater than or equal to 90% of their needs  Met with TF  MONITOR:   Vent status, TF tolerance, Labs, I & O's  ASSESSMENT:   73 yo female with PMH of DM-2, HTN, CAD from [place] who was admitted with fever and respiratory failure requiring intubation.  Discussed patient in ICU rounds and with RN today. She is tolerating TF without difficulty. Vital AF 1.2 via OGT at 55 ml/h (1320 ml per day) with Pro-stat 30 ml BID to provide 1784 kcal, 129 gm protein, 1071 ml free water daily.  Patient is currently intubated on ventilator support MV: 14 L/min Temp (24hrs), Avg:99.7 F (37.6 C), Min:97.5 F (36.4 C), Max:100.7 F (38.2 C)   Labs reviewed. Potassium 3 (L), phosphorus 1.8 (L) CBG's: [phone] Medications reviewed and include colace, KCl, potassium phosphate, lasix, fentanyl, insulin drip, levophed.   NUTRITION - FOCUSED PHYSICAL EXAM:    Most Recent Value  Orbital Region  No depletion  Upper Arm Region  No depletion  Thoracic and Lumbar Region  No depletion  Buccal Region  No depletion  Temple Region  No depletion  Clavicle Bone Region  No depletion  Clavicle and Acromion Bone Region  No depletion  Scapular Bone Region  Unable to assess  Dorsal Hand  No depletion  Patellar Region  No depletion  Anterior Thigh Region  No depletion  Posterior Calf Region  No depletion  Edema (RD Assessment)  Mild  Hair  Reviewed  Eyes  Reviewed  Mouth  Unable to assess  Skin  Reviewed  Nails  Reviewed       Diet Order:   Diet Order            Diet NPO time specified  Diet effective  now              EDUCATION NEEDS:   No education needs have been identified at this time  Skin:  Skin Assessment: Reviewed RN Assessment  Last BM:  None documented since admission  Height:   Ht Readings from Last 1 Encounters:  02/04/19 5' 6"  (1.676 m)    Weight:   Wt Readings from Last 1 Encounters:  02/07/19 86.4 kg   02/05/19 80.7 kg    Ideal Body Weight:  59.1 kg  BMI:  28.7 (using admit weight)  Estimated Nutritional Needs:   Kcal:  1885  Protein:  115-130 gm  Fluid:  1.8 L    Molli Barrows, RD, LDN, [place] Pager [phone] After Hours Pager [phone]

## 2019-02-07 NOTE — Progress Notes (Signed)
Inpatient Diabetes Program Recommendations  AACE/ADA: New Consensus Statement on Inpatient Glycemic Control (2015)  Target Ranges:  Prepandial:   less than 140 mg/dL      Peak postprandial:   less than 180 mg/dL (1-2 hours)      Critically ill patients:  140 - 180 mg/dL   Lab Results  Component Value Date   GLUCAP 340 (H) 02/07/2019   HGBA1C 10.1 (H) 02/04/2019    Patient on insulin drip since yesterday at noon (3/12). Rate has been increasing from 5.4units/hour when begun to current rate of 21.9 units/hour. CBG has still remained elevated up to 425 mg/dl at noon today even with drip running. Called and spoke to bedside RN to discuss as I am not sure why CBG are remaining so elevated while on a fairly high insulin drip rate. No current mantainance fluid ordered. I requested that patient's RN (Lexi) call Dr. Molli Knock and make him aware of current insulin drip rate / CBG that have not decreased. She will make MD aware.   -- Will follow during hospitalization.--  Jamelle Rushing RN, MSN Diabetes Coordinator Inpatient Glycemic Control Team Team Pager: [phone] (8am-5pm)

## 2019-02-07 NOTE — Progress Notes (Signed)
[facility]   Reason for visit: Follow up on bacteremia  Interval History: remains intubated, sedated.  WBC down to 16.1, Tmax 100.7.  Repeat blood cultures remain ngtd.   PEEP down some this am  Physical Exam: Constitutional:  Vitals:   02/07/19 0747 02/07/19 0800  BP:  (!) 102/52  Pulse:  83  Resp:  (!) 24  Temp:    SpO2: 100% 100%   patient intubated, sedated Eyes: anicteric HENT: +ET Respiratory: respiratory effort on vent; diffuse rhonchi Cardiovascular: tachy RR GI: soft, nt, nd  Review of Systems: Unable to be assessed due to patient factors  Lab Results  Component Value Date   WBC 16.1 (H) 02/07/2019   HGB 9.5 (L) 02/07/2019   HCT 30.5 (L) 02/07/2019   MCV 79.2 (L) 02/07/2019   PLT 153 02/07/2019    Lab Results  Component Value Date   CREATININE 1.44 (H) 02/07/2019   BUN 27 (H) 02/07/2019   NA 136 02/07/2019   K 3.0 (L) 02/07/2019   CL 110 02/07/2019   CO2 19 (L) 02/07/2019    Lab Results  Component Value Date   ALT 17 02/04/2019   AST 33 02/04/2019   ALKPHOS 53 02/04/2019     Microbiology: Recent Results (from the past 240 hour(s))  Culture, blood (Routine x 2)     Status: Abnormal   Collection Time: 02/03/19 11:54 PM  Result Value Ref Range Status   Specimen Description   Final    BLOOD RIGHT FOREARM Performed at [facility], [address] [place], [place] [address]    Special Requests   Final    BOTTLES DRAWN AEROBIC AND ANAEROBIC Blood Culture adequate volume Performed at [facility], [address] [place], [place] [address]    Culture  Setup Time   Final    GRAM POSITIVE COCCI IN BOTH AEROBIC AND ANAEROBIC BOTTLES CRITICAL RESULT CALLED TO, READ BACK BY AND VERIFIED WITH: A MASTERS PHARMD 2204 02/04/19 A BROWNING    Culture (A)  Final    GROUP A STREP (S.PYOGENES) ISOLATED HEALTH DEPARTMENT NOTIFIED Performed at [facility], [address] [place], [place] [address]    Report Status 02/06/2019 FINAL  Final   Organism ID, Bacteria GROUP A STREP (S.PYOGENES) ISOLATED  Final      Susceptibility   Group a strep (s.pyogenes) isolated - MIC*    PENICILLIN <=0.06 SENSITIVE Sensitive     CEFTRIAXONE <=0.12 SENSITIVE Sensitive     ERYTHROMYCIN <=0.12 SENSITIVE Sensitive     LEVOFLOXACIN 4 INTERMEDIATE Intermediate     VANCOMYCIN 0.5 SENSITIVE Sensitive     * GROUP A STREP (S.PYOGENES) ISOLATED  Blood Culture ID Panel (Reflexed)     Status: Abnormal   Collection Time: 02/03/19 11:54 PM  Result Value Ref Range Status   Enterococcus species NOT DETECTED NOT DETECTED Final   Listeria monocytogenes NOT DETECTED NOT DETECTED Final   Staphylococcus species NOT DETECTED NOT DETECTED Final   Staphylococcus aureus (BCID) NOT DETECTED NOT DETECTED Final   Streptococcus species DETECTED (A) NOT DETECTED Final    Comment: CRITICAL RESULT CALLED TO, READ BACK BY AND VERIFIED WITH: A MASTERS PHARMD 2204 02/04/19 A BROWNING    Streptococcus agalactiae NOT DETECTED NOT DETECTED Final   Streptococcus pneumoniae NOT DETECTED NOT DETECTED Final   Streptococcus pyogenes DETECTED (A) NOT DETECTED Final    Comment: CRITICAL RESULT CALLED TO, READ BACK BY AND VERIFIED  WITH: A MASTERS PHARMD 2204 02/04/19 A BROWNING    Acinetobacter baumannii NOT DETECTED NOT DETECTED Final   Enterobacteriaceae species NOT DETECTED NOT DETECTED Final   Enterobacter cloacae complex NOT DETECTED NOT DETECTED Final   Escherichia coli NOT DETECTED NOT DETECTED Final   Klebsiella oxytoca NOT DETECTED NOT DETECTED Final   Klebsiella pneumoniae NOT DETECTED NOT DETECTED Final   Proteus species NOT DETECTED NOT DETECTED Final   Serratia marcescens NOT DETECTED NOT DETECTED Final   Haemophilus influenzae NOT DETECTED NOT DETECTED Final   Neisseria meningitidis NOT DETECTED NOT DETECTED Final   Pseudomonas aeruginosa NOT DETECTED NOT DETECTED Final   Candida albicans NOT DETECTED NOT DETECTED Final    Candida glabrata NOT DETECTED NOT DETECTED Final   Candida krusei NOT DETECTED NOT DETECTED Final   Candida parapsilosis NOT DETECTED NOT DETECTED Final   Candida tropicalis NOT DETECTED NOT DETECTED Final    Comment: Performed at [facility], [address] [place], [place] [address]  Culture, blood (Routine x 2)     Status: Abnormal   Collection Time: 02/04/19 12:12 AM  Result Value Ref Range Status   Specimen Description   Final    BLOOD RIGHT WRIST Performed at [facility], [address] [place], [place] [address]    Special Requests   Final    BOTTLES DRAWN AEROBIC AND ANAEROBIC Blood Culture adequate volume Performed at [facility], [address] [place], [place] [address]    Culture  Setup Time   Final    GRAM POSITIVE COCCI IN BOTH AEROBIC AND ANAEROBIC BOTTLES CRITICAL RESULT CALLED TO, READ BACK BY AND VERIFIED WITH: A MASTERS PHARMD 2204 02/04/19 A BROWNING    Culture (A)  Final    GROUP A STREP (S.PYOGENES) ISOLATED SUSCEPTIBILITIES PERFORMED ON PREVIOUS CULTURE WITHIN THE LAST 5 DAYS. HEALTH DEPARTMENT NOTIFIED Performed at [facility], [address] [place], [place] [address]    Report Status 02/06/2019 FINAL  Final  Urine culture     Status: None   Collection Time: 02/04/19  1:23 AM  Result Value Ref Range Status   Specimen Description   Final    URINE, CATHETERIZED Performed at [facility], [address] [place], [place] [address]    Special Requests   Final    NONE Performed at [facility], [address] [place], [place] [address]    Culture   Final    NO GROWTH Performed at [facility], [address] [place], [place] [address]    Report Status 02/05/2019 FINAL  Final  Respiratory Panel by PCR     Status: Abnormal   Collection Time: 02/04/19  6:36 AM  Result Value Ref Range Status   Adenovirus NOT DETECTED NOT DETECTED Final   Coronavirus 229E NOT DETECTED NOT DETECTED  Final    Comment: (NOTE) The Coronavirus on the Respiratory Panel, DOES NOT test for the novel  Coronavirus (2019 nCoV)    Coronavirus HKU1 NOT DETECTED NOT DETECTED Final   Coronavirus NL63 NOT DETECTED NOT DETECTED Final   Coronavirus OC43 NOT DETECTED NOT DETECTED Final   Metapneumovirus NOT DETECTED NOT DETECTED Final   Rhinovirus / Enterovirus DETECTED (A) NOT DETECTED Final   Influenza A NOT DETECTED NOT DETECTED Final   Influenza B NOT DETECTED NOT DETECTED Final   Parainfluenza Virus 1 NOT DETECTED NOT DETECTED Final   Parainfluenza Virus 2 NOT  DETECTED NOT DETECTED Final   Parainfluenza Virus 3 NOT DETECTED NOT DETECTED Final   Parainfluenza Virus 4 NOT DETECTED NOT DETECTED Final   Respiratory Syncytial Virus NOT DETECTED NOT DETECTED Final   Bordetella pertussis NOT DETECTED NOT DETECTED Final   Chlamydophila pneumoniae NOT DETECTED NOT DETECTED Final   Mycoplasma pneumoniae NOT DETECTED NOT DETECTED Final    Comment: Performed at [facility], [address], [place], [place] [address]  MRSA PCR Screening     Status: None   Collection Time: 02/04/19  6:36 AM  Result Value Ref Range Status   MRSA by PCR NEGATIVE NEGATIVE Final    Comment:        The GeneXpert MRSA Assay (FDA approved for NASAL specimens only), is one component of a comprehensive MRSA colonization surveillance program. It is not intended to diagnose MRSA infection nor to guide or monitor treatment for MRSA infections. Performed at [facility], [address] [place], [place] [address]   Culture, blood (routine x 2)     Status: None (Preliminary result)   Collection Time: 02/04/19  8:50 AM  Result Value Ref Range Status   Specimen Description BLOOD CENTRAL LINE  Final   Special Requests   Final    BOTTLES DRAWN AEROBIC ONLY Blood Culture adequate volume   Culture   Final    NO GROWTH 2 DAYS Performed at [facility], [address] [place], [place] [address]    Report  Status PENDING  Incomplete  Culture, blood (routine x 2)     Status: None (Preliminary result)   Collection Time: 02/04/19  8:55 AM  Result Value Ref Range Status   Specimen Description BLOOD CENTRAL LINE  Final   Special Requests   Final    BOTTLES DRAWN AEROBIC ONLY Blood Culture adequate volume   Culture   Final    NO GROWTH 2 DAYS Performed at [facility], [address], [place], [place] [address]    Report Status PENDING  Incomplete  Culture, respiratory (non-expectorated)     Status: None   Collection Time: 02/04/19  9:22 AM  Result Value Ref Range Status   Specimen Description TRACHEAL ASPIRATE  Final   Special Requests Normal  Final   Gram Stain   Final    MODERATE WBC PRESENT, PREDOMINANTLY PMN RARE SQUAMOUS EPITHELIAL CELLS PRESENT NO ORGANISMS SEEN    Culture   Final    RARE Consistent with normal respiratory flora. Performed at [facility], [address] [place], [place] [address]    Report Status 02/06/2019 FINAL  Final  Urine culture     Status: None   Collection Time: 02/04/19  3:40 PM  Result Value Ref Range Status   Specimen Description URINE, RANDOM  Final   Special Requests ADDED ON 02/04/19 AT 2146  Final   Culture   Final    NO GROWTH Performed at [facility], [address], [place], [place] [address]    Report Status 02/06/2019 FINAL  Final    Impression/Plan:  1. GAS bacteremia - remains on penicillin.  No changes.    2.  Respiratory failure - remains intubated.  Attempts of decreasing peep today.    3.  Viral illness - rhinovirus positive.  Supportive care.

## 2019-02-07 NOTE — Progress Notes (Signed)
NAME:  Gabrielle Wright, MRN:  [id number], DOB:  January 13, 1946, LOS: 3 ADMISSION DATE:  02/03/2019, CONSULTATION DATE: 02/04/2019 REFERRING MD: Emergency department physician cHIEF COMPLAINT: Fever respiratory failure  Brief History   73 year old female refractory hypotension and respiratory distress required intubation on 02/04/2019  History of present illness   73 year old female is visiting from [place].  Noted to have a high fever 103.9.  Reported to have cough.  Taken to [facility] med center per family.  Was supported to have a urinary tract infection although her urine was clean.  She never filled her prescription for antibiotics.  Due to persistent hypotension and refractory nature of her hypertension she is transferred to [facility] 02/04/2019.  She was in respiratory distress for abdominal chest wall paradoxus.  She required intubation and central line placement.  She had maxed out on peripheral IV vasopressors therefore central line was placed.  Pulmonary critical care will be responsible for care at this time.  She was placed in isolation since she came from a [place] entry of [place] with recent reports of coronavirus and that country.  Note [place] is not on the list of infected countries.  Infectious disease was called and they reported again diagnosis done on 1 reported countries.  Past Medical History  Hypertension Coronary artery disease Suspected diabetes review of her arterial consult receiving correct.  Significant Hospital IsEvents   02/04/2019 transfer from [facility] to [facility] required intubation and pressors.  Consults:  02/04/2019 ID called>>  Procedures:  02/04/2019 intubation>> 02/04/2019 and central line placement>>  Significant Diagnostic Tests:  Patient 2020 chest x-ray essentially clear  Micro Data:  02/04/2019 blood cultures x2>> 02/04/2019 sputum culture>> 02/04/2019 urine culture>> 02/04/2019 respiratory virus panel>> 02/04/2019 flu a  and B>>  Possible coronavirus testing 02/04/2019>>   Antimicrobials:  02/04/2019 vancomycin>> 02/04/2019 Zosyn>>  Interim history/subjective:  No events overnight  Objective   Blood pressure (!) 102/52, pulse 83, temperature (!) 100.7 F (38.2 C), temperature source Oral, resp. rate (!) 24, height 5\' 6"  (1.676 m), weight 86.4 kg, SpO2 100 %.    Vent Mode: PRVC FiO2 (%):  [40 %-60 %] 40 % Set Rate:  [14 bmp-24 bmp] 24 bmp Vt Set:  [550 mL] 550 mL PEEP:  [12 cmH20-16 cmH20] 12 cmH20 Plateau Pressure:  [21 cmH20-35 cmH20] 31 cmH20   Intake/Output Summary (Last 24 hours) at 02/07/2019 0949 Last data filed at 02/07/2019 2518 Gross per 24 hour  Intake 4044.39 ml  Output 4940 ml  Net -895.61 ml   Filed Weights   02/05/19 0146 02/06/19 0500 02/07/19 0500  Weight: 80.7 kg 85.5 kg 86.4 kg   Examination: General: Acutely ill appearing female, NAD HENT: [place]/AT, PERRL, EOM-I and MMM Lungs: Diffuse crackles Cardiovascular: RRR, Nl S1/S2 and -M/R/G Abdomen: Soft, NT, ND and +BS Extremities: Warm and dry without edema Neuro: Sedate, withdraws all ext to pain  I reviewed CXR myself, pulmonary edema noted with ETT in good position  Resolved Hospital Problem list     Assessment & Plan:  Hypotension in the setting of fever with continuation of diuretics and potassium supplements and antihypertensives. - KVO IVF - D/C levophed - CVP monitoring - Empirical antimicrobial therapy - Viral testing empirically negative for COVID, positive for rhino and entero  Acute respiratory distress in the setting of fever 103.9 with clear chest x-ray.  Obvious abdominal chest wall paradoxus prior to intubation - Decrease PEEP to 8 - Maintain full vent support - Titrate O2  for sat of 88-92% - BD PRN - F/U on corona virus - ABG and CXR in AM  Suspected viral infection cannot rule out coronavirus at this time. - Isolation d/ced since COVID is negative - Respiratory virus panel positive for rhino and  entero - Continue abx as ordered  Acute renal insufficiency Lab Results  Component Value Date   CREATININE 1.44 (H) 02/07/2019   CREATININE 1.35 (H) 02/06/2019   CREATININE 1.43 (H) 02/05/2019  In the setting of chronic diuresis therapy ingestion of medications while sick. - Monitor renal function - Avoid nephrotoxins - Lasix drip at 10 mg/hr x24 hours - Zaroxolyn 5 mg PO x1 dose - Potassium replacement  Hyperkalemia Recent Labs  Lab 02/06/19 0549 02/06/19 0912 02/07/19 0242  K 5.3* 4.8 3.0*   - Monitor potassium - Monitor renal function - Lasix as above - BMET in AM  History of coronary disease, hypertension - Check twelve-lead for completeness  Hyperglycemia CBG (last 3)  Recent Labs    02/07/19 0654 02/07/19 0722 02/07/19 0826  GLUCAP 275* 316* 309*   - Sliding scale insulin - Check hemoglobin A1c  PCCM will continue to manage  Labs   CBC: Recent Labs  Lab 02/03/19 2354 02/04/19 0904  02/05/19 0254 02/05/19 0739 02/06/19 0421 02/06/19 0912 02/07/19 0242  WBC 10.3 46.3*  --   --  21.3* 20.2*  --  16.1*  NEUTROABS 9.5*  --   --   --   --   --   --   --   HGB 11.6* 10.7*   < > 10.5* 10.1* 10.0* 13.3 9.5*  HCT 38.9 36.1   < > 31.0* 33.3* 34.2* 39.0 30.5*  MCV 81.0 82.0  --   --  81.4 83.2  --  79.2*  PLT 187 236  --   --  167 181  --  153   < > = values in this interval not displayed.    Basic Metabolic Panel: Recent Labs  Lab 02/04/19 0904  02/04/19 1758 02/04/19 2200 02/05/19 0254 02/05/19 0739 02/06/19 0549 02/06/19 0912 02/07/19 0242  NA 131*   < > 133* 138 141 139 133* 136 136  K 7.1*   < > 6.8* 4.4 4.1 4.0 5.3* 4.8 3.0*  CL 106  --  110 113*  --  111 111  --  110  CO2 18*  --  18* 20*  --  19* 18*  --  19*  GLUCOSE 401*  --  429* 249*  --  167* 391*  --  254*  BUN 29*  --  30* 27*  --  21 24*  --  27*  CREATININE 1.75*  --  1.77* 1.59*  --  1.43* 1.35*  --  1.44*  CALCIUM 7.0*  --  6.9* 7.2*  --  7.1* 7.1*  --  7.7*  MG 1.2*   --  1.6*  --   --  1.4* 2.6*  --  2.1  PHOS 2.8  --  3.4  --   --  2.6 3.0  --  1.8*   < > = values in this interval not displayed.   GFR: Estimated Creatinine Clearance: 39.1 mL/min (A) (by C-G formula based on SCr of 1.44 mg/dL (H)). Recent Labs  Lab 02/03/19 2354 02/04/19 0254 02/04/19 0904 02/05/19 0739 02/06/19 0421 02/07/19 0242  PROCALCITON  --   --  20.03  --   --   --   WBC 10.3  --  46.3*  21.3* 20.2* 16.1*  LATICACIDVEN 1.9 1.7  --   --   --   --     Liver Function Tests: Recent Labs  Lab 02/03/19 2354 02/04/19 0904  AST 29 33  ALT 16 17  ALKPHOS 66 53  BILITOT 0.5 1.2  PROT 7.6 5.8*  ALBUMIN 4.0 2.8*   No results for input(s): LIPASE, AMYLASE in the last 168 hours. No results for input(s): AMMONIA in the last 168 hours.  ABG    Component Value Date/Time   PHART 7.470 (H) 02/07/2019 0240   PCO2ART 24.2 (L) 02/07/2019 0240   PO2ART 175 (H) 02/07/2019 0240   HCO3 17.4 (L) 02/07/2019 0240   TCO2 20 (L) 02/06/2019 0912   ACIDBASEDEF 5.7 (H) 02/07/2019 0240   O2SAT 98.9 02/07/2019 0240     Coagulation Profile: Recent Labs  Lab 02/04/19 0904  INR 1.3*    Cardiac Enzymes: No results for input(s): CKTOTAL, CKMB, CKMBINDEX, TROPONINI in the last 168 hours.  HbA1C: Hgb A1c MFr Bld  Date/Time Value Ref Range Status  02/04/2019 09:04 AM 10.1 (H) 4.8 - 5.6 % Final    Comment:    (NOTE) Pre diabetes:          5.7%-6.4% Diabetes:              >6.4% Glycemic control for   <7.0% adults with diabetes     CBG: Recent Labs  Lab 02/07/19 0506 02/07/19 0556 02/07/19 0654 02/07/19 0722 02/07/19 0826  GLUCAP 185* 174* 275* 316* 309*    Review of Systems:   History of coronary artery disease Hypertension But she is on Metroprolol Cardizem and potassium and Lasix.  Past Medical History  She,  has a past medical history of Coronary artery disease, Diabetes mellitus without complication (HCC), and Hypertension.   Surgical History    Past  Surgical History:  Procedure Laterality Date  . CORONARY ANGIOPLASTY WITH STENT PLACEMENT       Social History   reports that she has never smoked. She has never used smokeless tobacco. She reports that she does not drink alcohol or use drugs.   Family History   Her family history is not on file.   Allergies No Known Allergies   Home Medications  Prior to Admission medications   Not on File    Family updated over the phone then bedside.  The patient is critically ill with multiple organ systems failure and requires high complexity decision making for assessment and support, frequent evaluation and titration of therapies, application of advanced monitoring technologies and extensive interpretation of multiple databases.   Critical Care Time devoted to patient care services described in this note is  33  Minutes. This time reflects time of care of this signee Dr Koren Bound. This critical care time does not reflect procedure time, or teaching time or supervisory time of PA/NP/Med student/Med Resident etc but could involve care discussion time.  Alyson Reedy, M.D. [facility] Pulmonary/Critical Care Medicine. Pager: [phone]. After hours pager: [phone].

## 2019-02-07 NOTE — Progress Notes (Signed)
eLink Physician-Brief Progress Note Patient Name: Gabrielle Wright DOB: [birth year] MRN: [id number]   Date of Service  02/07/2019  HPI/Events of Note  Hypokalemia and hypophos  eICU Interventions  Potassium and phos replaced     Intervention Category Intermediate Interventions: Electrolyte abnormality - evaluation and management  Perel Hauschild 02/07/2019, 5:56 AM

## 2019-02-08 ENCOUNTER — Encounter (HOSPITAL_COMMUNITY): Payer: Self-pay

## 2019-02-08 ENCOUNTER — Inpatient Hospital Stay (HOSPITAL_COMMUNITY): Payer: Medicaid Other

## 2019-02-08 DIAGNOSIS — J189 Pneumonia, unspecified organism: Secondary | ICD-10-CM

## 2019-02-08 LAB — BLOOD GAS, ARTERIAL
Acid-base deficit: 0.1 mmol/L (ref 0.0–2.0)
Bicarbonate: 23.8 mmol/L (ref 20.0–28.0)
Drawn by: 25203
FIO2: 40
MECHVT: 550 mL
O2 SAT: 91.7 %
PCO2 ART: 36.7 mmHg (ref 32.0–48.0)
PEEP: 5 cmH2O
PH ART: 7.427 (ref 7.350–7.450)
Patient temperature: 98.6
RATE: 16 resp/min
pO2, Arterial: 65 mmHg — ABNORMAL LOW (ref 83.0–108.0)

## 2019-02-08 LAB — POCT I-STAT 7, (LYTES, BLD GAS, ICA,H+H)
Bicarbonate: 22.7 mmol/L (ref 20.0–28.0)
Calcium, Ion: 1.2 mmol/L (ref 1.15–1.40)
HCT: 27 % — ABNORMAL LOW (ref 36.0–46.0)
Hemoglobin: 9.2 g/dL — ABNORMAL LOW (ref 12.0–15.0)
O2 Saturation: 99 %
PH ART: 7.531 — AB (ref 7.350–7.450)
POTASSIUM: 4.2 mmol/L (ref 3.5–5.1)
Patient temperature: 98.4
Sodium: 139 mmol/L (ref 135–145)
TCO2: 24 mmol/L (ref 22–32)
pCO2 arterial: 27.1 mmHg — ABNORMAL LOW (ref 32.0–48.0)
pO2, Arterial: 109 mmHg — ABNORMAL HIGH (ref 83.0–108.0)

## 2019-02-08 LAB — GLUCOSE, CAPILLARY
GLUCOSE-CAPILLARY: 132 mg/dL — AB (ref 70–99)
GLUCOSE-CAPILLARY: 148 mg/dL — AB (ref 70–99)
GLUCOSE-CAPILLARY: 189 mg/dL — AB (ref 70–99)
Glucose-Capillary: 135 mg/dL — ABNORMAL HIGH (ref 70–99)
Glucose-Capillary: 145 mg/dL — ABNORMAL HIGH (ref 70–99)
Glucose-Capillary: 153 mg/dL — ABNORMAL HIGH (ref 70–99)
Glucose-Capillary: 169 mg/dL — ABNORMAL HIGH (ref 70–99)
Glucose-Capillary: 179 mg/dL — ABNORMAL HIGH (ref 70–99)
Glucose-Capillary: 184 mg/dL — ABNORMAL HIGH (ref 70–99)
Glucose-Capillary: 198 mg/dL — ABNORMAL HIGH (ref 70–99)
Glucose-Capillary: 281 mg/dL — ABNORMAL HIGH (ref 70–99)
Glucose-Capillary: 417 mg/dL — ABNORMAL HIGH (ref 70–99)
Glucose-Capillary: 421 mg/dL — ABNORMAL HIGH (ref 70–99)
Glucose-Capillary: 92 mg/dL (ref 70–99)

## 2019-02-08 LAB — BASIC METABOLIC PANEL
ANION GAP: 9 (ref 5–15)
BUN: 32 mg/dL — ABNORMAL HIGH (ref 8–23)
CALCIUM: 8.6 mg/dL — AB (ref 8.9–10.3)
CO2: 24 mmol/L (ref 22–32)
Chloride: 106 mmol/L (ref 98–111)
Creatinine, Ser: 1.41 mg/dL — ABNORMAL HIGH (ref 0.44–1.00)
GFR calc Af Amer: 43 mL/min — ABNORMAL LOW (ref >60)
GFR calc non Af Amer: 37 mL/min — ABNORMAL LOW (ref >60)
Glucose, Bld: 133 mg/dL — ABNORMAL HIGH (ref 70–99)
Potassium: 4.4 mmol/L (ref 3.5–5.1)
Sodium: 139 mmol/L (ref 135–145)

## 2019-02-08 LAB — CBC
HEMATOCRIT: 28.2 % — AB (ref 36.0–46.0)
Hemoglobin: 8.9 g/dL — ABNORMAL LOW (ref 12.0–15.0)
MCH: 24.8 pg — ABNORMAL LOW (ref 26.0–34.0)
MCHC: 31.6 g/dL (ref 30.0–36.0)
MCV: 78.6 fL — ABNORMAL LOW (ref 80.0–100.0)
Platelets: 160 10*3/uL (ref 150–400)
RBC: 3.59 MIL/uL — ABNORMAL LOW (ref 3.87–5.11)
RDW: 18 % — ABNORMAL HIGH (ref 11.5–15.5)
WBC: 18.1 10*3/uL — AB (ref 4.0–10.5)
nRBC: 0.1 % (ref 0.0–0.2)

## 2019-02-08 LAB — PHOSPHORUS: PHOSPHORUS: 3.5 mg/dL (ref 2.5–4.6)

## 2019-02-08 LAB — MAGNESIUM: Magnesium: 1.9 mg/dL (ref 1.7–2.4)

## 2019-02-08 MED ORDER — INSULIN DETEMIR 100 UNIT/ML ~~LOC~~ SOLN
30.0000 [IU] | Freq: Two times a day (BID) | SUBCUTANEOUS | Status: DC
  Administered 2019-02-08 (×2): 30 [IU] via SUBCUTANEOUS
  Filled 2019-02-08 (×3): qty 0.3

## 2019-02-08 MED ORDER — CHLORHEXIDINE GLUCONATE CLOTH 2 % EX PADS
6.0000 | MEDICATED_PAD | Freq: Every day | CUTANEOUS | Status: DC
  Administered 2019-02-08 – 2019-02-09 (×2): 6 via TOPICAL

## 2019-02-08 MED ORDER — INSULIN ASPART 100 UNIT/ML ~~LOC~~ SOLN: 0.0000 [IU] | SUBCUTANEOUS | Status: DC

## 2019-02-08 MED ORDER — INSULIN ASPART 100 UNIT/ML ~~LOC~~ SOLN
0.0000 [IU] | SUBCUTANEOUS | Status: DC
  Administered 2019-02-08: 20 [IU] via SUBCUTANEOUS
  Administered 2019-02-08: 12 [IU] via SUBCUTANEOUS
  Administered 2019-02-08: 20 [IU] via SUBCUTANEOUS
  Administered 2019-02-09: 12 [IU] via SUBCUTANEOUS
  Administered 2019-02-09: 16 [IU] via SUBCUTANEOUS
  Administered 2019-02-09 (×2): 20 [IU] via SUBCUTANEOUS
  Administered 2019-02-09: 8 [IU] via SUBCUTANEOUS
  Administered 2019-02-09: 16 [IU] via SUBCUTANEOUS
  Administered 2019-02-10: 20 [IU] via SUBCUTANEOUS
  Administered 2019-02-10: 12 [IU] via SUBCUTANEOUS
  Administered 2019-02-10: 20 [IU] via SUBCUTANEOUS
  Administered 2019-02-10 – 2019-02-11 (×6): 16 [IU] via SUBCUTANEOUS
  Administered 2019-02-11: 12 [IU] via SUBCUTANEOUS
  Administered 2019-02-11: 11 [IU] via SUBCUTANEOUS
  Administered 2019-02-11 – 2019-02-12 (×2): 16 [IU] via SUBCUTANEOUS
  Administered 2019-02-12: 8 [IU] via SUBCUTANEOUS
  Administered 2019-02-12: 12 [IU] via SUBCUTANEOUS
  Administered 2019-02-12: 4 [IU] via SUBCUTANEOUS
  Administered 2019-02-12: 12 [IU] via SUBCUTANEOUS
  Administered 2019-02-13: 2 [IU] via SUBCUTANEOUS
  Administered 2019-02-13: 4 [IU] via SUBCUTANEOUS
  Administered 2019-02-13: 2 [IU] via SUBCUTANEOUS
  Administered 2019-02-14: 8 [IU] via SUBCUTANEOUS
  Administered 2019-02-14: 16 [IU] via SUBCUTANEOUS
  Administered 2019-02-14 (×2): 8 [IU] via SUBCUTANEOUS
  Administered 2019-02-14: 16 [IU] via SUBCUTANEOUS
  Administered 2019-02-14: 4 [IU] via SUBCUTANEOUS
  Administered 2019-02-15 (×3): 8 [IU] via SUBCUTANEOUS
  Administered 2019-02-15: 16 [IU] via SUBCUTANEOUS
  Administered 2019-02-15: 8 [IU] via SUBCUTANEOUS
  Administered 2019-02-15 – 2019-02-16 (×4): 12 [IU] via SUBCUTANEOUS
  Administered 2019-02-16: 16 [IU] via SUBCUTANEOUS
  Administered 2019-02-16 (×2): 12 [IU] via SUBCUTANEOUS
  Administered 2019-02-17: 8 [IU] via SUBCUTANEOUS
  Administered 2019-02-17: 12 [IU] via SUBCUTANEOUS
  Administered 2019-02-17: 8 [IU] via SUBCUTANEOUS
  Administered 2019-02-17: 12 [IU] via SUBCUTANEOUS
  Administered 2019-02-17 – 2019-02-18 (×3): 8 [IU] via SUBCUTANEOUS
  Administered 2019-02-18: 4 [IU] via SUBCUTANEOUS
  Administered 2019-02-18: 12 [IU] via SUBCUTANEOUS
  Administered 2019-02-18: 2 [IU] via SUBCUTANEOUS
  Administered 2019-02-18 (×2): 8 [IU] via SUBCUTANEOUS
  Administered 2019-02-19: 2 [IU] via SUBCUTANEOUS
  Administered 2019-02-19: 16 [IU] via SUBCUTANEOUS
  Administered 2019-02-19: 8 [IU] via SUBCUTANEOUS
  Administered 2019-02-19: 12 [IU] via SUBCUTANEOUS
  Administered 2019-02-20 (×3): 4 [IU] via SUBCUTANEOUS
  Administered 2019-02-20: 2 [IU] via SUBCUTANEOUS
  Administered 2019-02-20: 8 [IU] via SUBCUTANEOUS
  Administered 2019-02-20: 4 [IU] via SUBCUTANEOUS
  Administered 2019-02-21 (×3): 8 [IU] via SUBCUTANEOUS
  Administered 2019-02-21: 4 [IU] via SUBCUTANEOUS
  Administered 2019-02-21: 8 [IU] via SUBCUTANEOUS
  Administered 2019-02-22 (×2): 4 [IU] via SUBCUTANEOUS
  Administered 2019-02-22: 8 [IU] via SUBCUTANEOUS
  Administered 2019-02-22: 2 [IU] via SUBCUTANEOUS
  Administered 2019-02-22: 8 [IU] via SUBCUTANEOUS
  Administered 2019-02-22: 4 [IU] via SUBCUTANEOUS
  Administered 2019-02-23 (×4): 2 [IU] via SUBCUTANEOUS
  Administered 2019-02-23: 4 [IU] via SUBCUTANEOUS
  Administered 2019-02-24 – 2019-02-25 (×3): 2 [IU] via SUBCUTANEOUS
  Administered 2019-02-26: 01:00:00 4 [IU] via SUBCUTANEOUS
  Administered 2019-02-26: 8 [IU] via SUBCUTANEOUS
  Administered 2019-02-26: 2 [IU] via SUBCUTANEOUS
  Administered 2019-02-26: 8 [IU] via SUBCUTANEOUS
  Administered 2019-02-26 – 2019-02-27 (×5): 4 [IU] via SUBCUTANEOUS
  Administered 2019-02-27: 8 [IU] via SUBCUTANEOUS
  Administered 2019-02-27: 20:00:00 4 [IU] via SUBCUTANEOUS
  Administered 2019-02-28: 2 [IU] via SUBCUTANEOUS
  Administered 2019-02-28: 8 [IU] via SUBCUTANEOUS
  Administered 2019-02-28: 2 [IU] via SUBCUTANEOUS
  Administered 2019-02-28 (×2): 4 [IU] via SUBCUTANEOUS
  Administered 2019-03-01: 8 [IU] via SUBCUTANEOUS
  Administered 2019-03-01: 4 [IU] via SUBCUTANEOUS
  Administered 2019-03-01: 8 [IU] via SUBCUTANEOUS
  Administered 2019-03-01: 16:00:00 4 [IU] via SUBCUTANEOUS
  Administered 2019-03-01: 12:00:00 8 [IU] via SUBCUTANEOUS
  Administered 2019-03-01: 12 [IU] via SUBCUTANEOUS
  Administered 2019-03-02 (×4): 4 [IU] via SUBCUTANEOUS
  Administered 2019-03-02: 12:00:00 12 [IU] via SUBCUTANEOUS
  Administered 2019-03-02 – 2019-03-03 (×3): 4 [IU] via SUBCUTANEOUS
  Administered 2019-03-03: 8 [IU] via SUBCUTANEOUS
  Administered 2019-03-03 (×3): 4 [IU] via SUBCUTANEOUS
  Administered 2019-03-04: 2 [IU] via SUBCUTANEOUS
  Administered 2019-03-04: 8 [IU] via SUBCUTANEOUS
  Administered 2019-03-04: 20:00:00 2 [IU] via SUBCUTANEOUS
  Administered 2019-03-05: 4 [IU] via SUBCUTANEOUS
  Administered 2019-03-06: 12:00:00 8 [IU] via SUBCUTANEOUS
  Administered 2019-03-06 (×2): 2 [IU] via SUBCUTANEOUS
  Administered 2019-03-07 (×2): 4 [IU] via SUBCUTANEOUS
  Administered 2019-03-07 (×3): 2 [IU] via SUBCUTANEOUS
  Administered 2019-03-08: 8 [IU] via SUBCUTANEOUS
  Administered 2019-03-08: 21:00:00 4 [IU] via SUBCUTANEOUS
  Administered 2019-03-08: 18:00:00 2 [IU] via SUBCUTANEOUS
  Administered 2019-03-08: 4 [IU] via SUBCUTANEOUS
  Administered 2019-03-08: 01:00:00 8 [IU] via SUBCUTANEOUS
  Administered 2019-03-09: 20:00:00 2 [IU] via SUBCUTANEOUS
  Administered 2019-03-09: 8 [IU] via SUBCUTANEOUS
  Administered 2019-03-10: 05:00:00 2 [IU] via SUBCUTANEOUS
  Administered 2019-03-10 (×2): 4 [IU] via SUBCUTANEOUS
  Administered 2019-03-10: 09:00:00 2 [IU] via SUBCUTANEOUS
  Administered 2019-03-10 (×2): 4 [IU] via SUBCUTANEOUS
  Administered 2019-03-11: 8 [IU] via SUBCUTANEOUS
  Administered 2019-03-11: 04:00:00 4 [IU] via SUBCUTANEOUS
  Administered 2019-03-11: 2 [IU] via SUBCUTANEOUS
  Administered 2019-03-11 (×2): 8 [IU] via SUBCUTANEOUS
  Administered 2019-03-11 – 2019-03-12 (×2): 4 [IU] via SUBCUTANEOUS
  Administered 2019-03-12 (×2): 8 [IU] via SUBCUTANEOUS
  Administered 2019-03-12 – 2019-03-13 (×3): 4 [IU] via SUBCUTANEOUS
  Administered 2019-03-13 (×4): 8 [IU] via SUBCUTANEOUS
  Administered 2019-03-14 (×4): 4 [IU] via SUBCUTANEOUS
  Administered 2019-03-14 – 2019-03-15 (×3): 8 [IU] via SUBCUTANEOUS
  Administered 2019-03-15: 2 [IU] via SUBCUTANEOUS
  Administered 2019-03-15: 20:00:00 4 [IU] via SUBCUTANEOUS
  Administered 2019-03-15: 8 [IU] via SUBCUTANEOUS
  Administered 2019-03-15: 2 [IU] via SUBCUTANEOUS
  Administered 2019-03-15: 8 [IU] via SUBCUTANEOUS
  Administered 2019-03-16 (×2): 4 [IU] via SUBCUTANEOUS
  Administered 2019-03-16: 8 [IU] via SUBCUTANEOUS
  Administered 2019-03-16 (×2): 4 [IU] via SUBCUTANEOUS
  Administered 2019-03-16: 8 [IU] via SUBCUTANEOUS
  Administered 2019-03-17: 2 [IU] via SUBCUTANEOUS
  Administered 2019-03-17 (×2): 8 [IU] via SUBCUTANEOUS
  Administered 2019-03-18 (×2): 4 [IU] via SUBCUTANEOUS
  Administered 2019-03-18: 8 [IU] via SUBCUTANEOUS
  Administered 2019-03-18: 2 [IU] via SUBCUTANEOUS
  Administered 2019-03-18 (×2): 4 [IU] via SUBCUTANEOUS
  Administered 2019-03-18: 8 [IU] via SUBCUTANEOUS
  Administered 2019-03-19: 4 [IU] via SUBCUTANEOUS
  Administered 2019-03-19: 8 [IU] via SUBCUTANEOUS
  Administered 2019-03-19 – 2019-03-20 (×4): 4 [IU] via SUBCUTANEOUS
  Administered 2019-03-20: 8 [IU] via SUBCUTANEOUS
  Administered 2019-03-20: 2 [IU] via SUBCUTANEOUS
  Administered 2019-03-20: 8 [IU] via SUBCUTANEOUS
  Administered 2019-03-21 (×2): 2 [IU] via SUBCUTANEOUS
  Administered 2019-03-21: 8 [IU] via SUBCUTANEOUS
  Administered 2019-03-21 – 2019-03-22 (×2): 4 [IU] via SUBCUTANEOUS
  Administered 2019-03-22: 2 [IU] via SUBCUTANEOUS
  Administered 2019-03-23 (×2): 8 [IU] via SUBCUTANEOUS
  Administered 2019-03-24: 5 [IU] via SUBCUTANEOUS
  Administered 2019-03-24: 4 [IU] via SUBCUTANEOUS
  Administered 2019-03-24: 2 [IU] via SUBCUTANEOUS
  Administered 2019-03-24: 8 [IU] via SUBCUTANEOUS
  Administered 2019-03-24 – 2019-03-25 (×2): 4 [IU] via SUBCUTANEOUS
  Administered 2019-03-25: 8 [IU] via SUBCUTANEOUS
  Administered 2019-03-25 (×2): 2 [IU] via SUBCUTANEOUS
  Administered 2019-03-26 (×2): 8 [IU] via SUBCUTANEOUS
  Administered 2019-03-26: 4 [IU] via SUBCUTANEOUS
  Administered 2019-03-26: 2 [IU] via SUBCUTANEOUS
  Administered 2019-03-26: 4 [IU] via SUBCUTANEOUS
  Administered 2019-03-27: 12 [IU] via SUBCUTANEOUS
  Administered 2019-03-27: 2 [IU] via SUBCUTANEOUS

## 2019-02-08 MED ORDER — POLYETHYLENE GLYCOL 3350 17 G PO PACK
17.0000 g | PACK | Freq: Every day | ORAL | Status: DC
  Administered 2019-02-08 – 2019-03-08 (×16): 17 g via ORAL
  Filled 2019-02-08 (×21): qty 1

## 2019-02-08 MED ORDER — DEXMEDETOMIDINE HCL IN NACL 400 MCG/100ML IV SOLN
0.4000 ug/kg/h | INTRAVENOUS | Status: DC
  Administered 2019-02-08: 1 ug/kg/h via INTRAVENOUS
  Administered 2019-02-08 – 2019-02-09 (×3): 0.4 ug/kg/h via INTRAVENOUS
  Administered 2019-02-09: 1 ug/kg/h via INTRAVENOUS
  Administered 2019-02-09: 0.6 ug/kg/h via INTRAVENOUS
  Administered 2019-02-10 (×2): 1.2 ug/kg/h via INTRAVENOUS
  Administered 2019-02-10: 0.6 ug/kg/h via INTRAVENOUS
  Administered 2019-02-10: 0.8 ug/kg/h via INTRAVENOUS
  Administered 2019-02-11: 0.5 ug/kg/h via INTRAVENOUS
  Administered 2019-02-11 (×2): 1.2 ug/kg/h via INTRAVENOUS
  Administered 2019-02-12 (×2): 0.8 ug/kg/h via INTRAVENOUS
  Administered 2019-02-12: 1.2 ug/kg/h via INTRAVENOUS
  Administered 2019-02-12: 1 ug/kg/h via INTRAVENOUS
  Administered 2019-02-13: 1.2 ug/kg/h via INTRAVENOUS
  Administered 2019-02-13: 0.8 ug/kg/h via INTRAVENOUS
  Administered 2019-02-13: 1.2 ug/kg/h via INTRAVENOUS
  Filled 2019-02-08 (×2): qty 100
  Filled 2019-02-08: qty 200
  Filled 2019-02-08 (×21): qty 100

## 2019-02-08 MED ORDER — INSULIN DETEMIR 100 UNIT/ML ~~LOC~~ SOLN
30.0000 [IU] | Freq: Two times a day (BID) | SUBCUTANEOUS | Status: DC
  Filled 2019-02-08: qty 0.3

## 2019-02-08 NOTE — Progress Notes (Signed)
NAME:  Gabrielle Wright, MRN:  [id number], DOB:  02-21-1946, LOS: 4 ADMISSION DATE:  02/03/2019, CONSULTATION DATE: 02/04/2019 REFERRING MD: Emergency department physician cHIEF COMPLAINT: Fever respiratory failure  Brief History   73 year old female refractory hypotension and respiratory distress required intubation on 02/04/2019  History of present illness   73 year old female is visiting from [place].  Noted to have a high fever 103.9.  Reported to have cough.  Taken to [facility] per family.  Was supported to have a urinary tract infection although her urine was clean.  She never filled her prescription for antibiotics.  Due to persistent hypotension and refractory nature of her hypertension she is transferred to [facility] 02/04/2019.  She was in respiratory distress for abdominal chest wall paradoxus.  She required intubation and central line placement.  She had maxed out on peripheral IV vasopressors therefore central line was placed.  Pulmonary critical care will be responsible for care at this time.  She was placed in isolation since she came from a 11 entry of [place] with recent reports of coronavirus and that country.  Note [place] is not on the list of infected countries.  Infectious disease was called and they reported again diagnosis done on 1 reported countries.  Past Medical History  Hypertension Coronary artery disease Suspected diabetes review of her arterial consult receiving correct.  Significant Hospital IsEvents   02/04/2019 transfer from [facility] to [facility] required intubation and pressors.  Consults:  02/04/2019 ID called>>  Procedures:  02/04/2019 intubation>> 02/04/2019 and central line placement>>  Significant Diagnostic Tests:  Patient 2020 chest x-ray essentially clear  Micro Data:  02/04/2019 blood cultures x2>> 02/04/2019 sputum culture>> 02/04/2019 urine culture>> 02/04/2019 respiratory virus panel>> 02/04/2019 flu a  and B>>  Possible coronavirus testing 02/04/2019>>   Antimicrobials:  02/04/2019 vancomycin>> 02/04/2019 Zosyn>>  Interim history/subjective:  No events overnight, no new complaints  Objective   Blood pressure 113/61, pulse 96, temperature 98.1 F (36.7 C), temperature source Oral, resp. rate 16, height 5\' 6"  (1.676 m), weight 82.2 kg, SpO2 100 %. CVP:  [0 mmHg] 0 mmHg  Vent Mode: CPAP;PSV FiO2 (%):  [40 %] 40 % Set Rate:  [16 bmp-24 bmp] 16 bmp Vt Set:  [550 mL] 550 mL PEEP:  [5 cmH20-8 cmH20] 5 cmH20 Pressure Support:  [5 cmH20] 5 cmH20 Plateau Pressure:  [27 cmH20] 27 cmH20   Intake/Output Summary (Last 24 hours) at 02/08/2019 2536 Last data filed at 02/08/2019 0700 Gross per 24 hour  Intake 3831.14 ml  Output 8395 ml  Net -4563.86 ml   Filed Weights   02/06/19 0500 02/07/19 0500 02/08/19 0408  Weight: 85.5 kg 86.4 kg 82.2 kg   Examination: General: Acutely ill appearing, NAD HENT: [place]/AT, PERRL, EOM-I and MMM Lungs: Diffuse infiltrate Cardiovascular: RRR, Nl S1/S2 and -M/R/G Abdomen: Soft, NT, ND and +BS Extremities: Warm and dry without edema Neuro: Sedate, withdraws all ext to pain  I reviewed CXR myself, ETT is in a good position  Resolved Hospital Problem list     Assessment & Plan:  Hypotension in the setting of fever with continuation of diuretics and potassium supplements and antihypertensives. - KVO IVF - D/C levophed - CVP monitoring - Empirical antimicrobial therapy - Viral testing empirically negative for COVID, positive for rhino and entero  Acute respiratory distress in the setting of fever 103.9 with clear chest x-ray.  Obvious abdominal chest wall paradoxus prior to intubation - Decrease PEEP to 5 - Begin  PS trials - Titrate O2 for sat of 88-92% - BD PRN  Suspected viral infection cannot rule out coronavirus at this time. - Isolation d/ced since COVID is negative - Respiratory virus panel positive for rhino and entero - Continue abx as  ordered  Acute renal insufficiency Lab Results  Component Value Date   CREATININE 1.41 (H) 02/08/2019   CREATININE 1.44 (H) 02/07/2019   CREATININE 1.35 (H) 02/06/2019  In the setting of chronic diuresis therapy ingestion of medications while sick. - Monitor renal function - Avoid nephrotoxins  Hyperkalemia Recent Labs  Lab 02/07/19 0242 02/08/19 0318 02/08/19 0410  K 3.0* 4.2 4.4  - Monitor potassium - Monitor renal function - BMET in AM  History of coronary disease, hypertension - Check twelve-lead for completeness  Hyperglycemia CBG (last 3)  Recent Labs    02/08/19 0540 02/08/19 0639 02/08/19 0755  GLUCAP 148* 184* 169*   - Sliding scale insulin - Check hemoglobin A1c  PCCM will continue to manage  Labs   CBC: Recent Labs  Lab 02/03/19 2354 02/04/19 0904  02/05/19 0739 02/06/19 0421 02/06/19 0912 02/07/19 0242 02/08/19 0318 02/08/19 0410  WBC 10.3 46.3*  --  21.3* 20.2*  --  16.1*  --  18.1*  NEUTROABS 9.5*  --   --   --   --   --   --   --   --   HGB 11.6* 10.7*   < > 10.1* 10.0* 13.3 9.5* 9.2* 8.9*  HCT 38.9 36.1   < > 33.3* 34.2* 39.0 30.5* 27.0* 28.2*  MCV 81.0 82.0  --  81.4 83.2  --  79.2*  --  78.6*  PLT 187 236  --  167 181  --  153  --  160   < > = values in this interval not displayed.    Basic Metabolic Panel: Recent Labs  Lab 02/04/19 1758 02/04/19 2200  02/05/19 0739 02/06/19 0549 02/06/19 0912 02/07/19 0242 02/08/19 0318 02/08/19 0410  NA 133* 138   < > 139 133* 136 136 139 139  K 6.8* 4.4   < > 4.0 5.3* 4.8 3.0* 4.2 4.4  CL 110 113*  --  111 111  --  110  --  106  CO2 18* 20*  --  19* 18*  --  19*  --  24  GLUCOSE 429* 249*  --  167* 391*  --  254*  --  133*  BUN 30* 27*  --  21 24*  --  27*  --  32*  CREATININE 1.77* 1.59*  --  1.43* 1.35*  --  1.44*  --  1.41*  CALCIUM 6.9* 7.2*  --  7.1* 7.1*  --  7.7*  --  8.6*  MG 1.6*  --   --  1.4* 2.6*  --  2.1  --  1.9  PHOS 3.4  --   --  2.6 3.0  --  1.8*  --  3.5   < > =  values in this interval not displayed.   GFR: Estimated Creatinine Clearance: 39 mL/min (A) (by C-G formula based on SCr of 1.41 mg/dL (H)). Recent Labs  Lab 02/03/19 2354 02/04/19 0254 02/04/19 0904 02/05/19 0739 02/06/19 0421 02/07/19 0242 02/08/19 0410  PROCALCITON  --   --  20.03  --   --   --   --   WBC 10.3  --  46.3* 21.3* 20.2* 16.1* 18.1*  LATICACIDVEN 1.9 1.7  --   --   --   --   --  Liver Function Tests: Recent Labs  Lab 02/03/19 2354 02/04/19 0904  AST 29 33  ALT 16 17  ALKPHOS 66 53  BILITOT 0.5 1.2  PROT 7.6 5.8*  ALBUMIN 4.0 2.8*   No results for input(s): LIPASE, AMYLASE in the last 168 hours. No results for input(s): AMMONIA in the last 168 hours.  ABG    Component Value Date/Time   PHART 7.427 02/08/2019 0515   PCO2ART 36.7 02/08/2019 0515   PO2ART 65.0 (L) 02/08/2019 0515   HCO3 23.8 02/08/2019 0515   TCO2 24 02/08/2019 0318   ACIDBASEDEF 0.1 02/08/2019 0515   O2SAT 91.7 02/08/2019 0515     Coagulation Profile: Recent Labs  Lab 02/04/19 0904  INR 1.3*    Cardiac Enzymes: No results for input(s): CKTOTAL, CKMB, CKMBINDEX, TROPONINI in the last 168 hours.  HbA1C: Hgb A1c MFr Bld  Date/Time Value Ref Range Status  02/04/2019 09:04 AM 10.1 (H) 4.8 - 5.6 % Final    Comment:    (NOTE) Pre diabetes:          5.7%-6.4% Diabetes:              >6.4% Glycemic control for   <7.0% adults with diabetes     CBG: Recent Labs  Lab 02/08/19 0345 02/08/19 0441 02/08/19 0540 02/08/19 0639 02/08/19 0755  GLUCAP 92 132* 148* 184* 169*    Review of Systems:   History of coronary artery disease Hypertension But she is on Metroprolol Cardizem and potassium and Lasix.  Past Medical History  She,  has a past medical history of Coronary artery disease, Diabetes mellitus without complication (HCC), and Hypertension.   Surgical History    Past Surgical History:  Procedure Laterality Date  . CORONARY ANGIOPLASTY WITH STENT PLACEMENT        Social History   reports that she has never smoked. She has never used smokeless tobacco. She reports that she does not drink alcohol or use drugs.   Family History   Her family history is not on file.   Allergies No Known Allergies   Home Medications  Prior to Admission medications   Not on File    No family bedside  The patient is critically ill with multiple organ systems failure and requires high complexity decision making for assessment and support, frequent evaluation and titration of therapies, application of advanced monitoring technologies and extensive interpretation of multiple databases.   Critical Care Time devoted to patient care services described in this note is  32  Minutes. This time reflects time of care of this signee Dr Koren Bound. This critical care time does not reflect procedure time, or teaching time or supervisory time of PA/NP/Med student/Med Resident etc but could involve care discussion time.  Alyson Reedy, M.D. [facility] Pulmonary/Critical Care Medicine. Pager: [phone]. After hours pager: [phone].

## 2019-02-08 NOTE — Progress Notes (Signed)
Elink notified of patient vomiting X2. Tube feeds held and patient suctioned. Patient agitated. Fentanyl increased to 400 mcg. Precedex ordered. Will go down on fentanyl. Zofran given. Chest xray ordered. Will continue to monitor.

## 2019-02-08 NOTE — Progress Notes (Addendum)
eLink Physician-Brief Progress Note Patient Name: Gabrielle Wright DOB: 1946-08-09 MRN: [id number]   Date of Service  02/08/2019  HPI/Events of Note  Informed by  Bedside RN That patient vomited and tube feeds have been on hold.  Pt also seems agitated and restless in bed.  RN had to increase fentanyl gtt to 400ug.  Precedex was discontinued earlier in the day.   eICU Interventions  Check CXR although likelihood of aspiration is slim given that she is still intubated. Zofran prn.  Restart precedex gtt and decrease fentanyl gtt.     Intervention Category Intermediate Interventions: Other:  Larinda Buttery 02/08/2019, 12:50 AM   3:31 AM  Informed byy RT ABG showing resp alkalosis: 7.531/27/109.  Plan> Decrease RR to 16 and repeat ABG at 5am.  PEEP has been decreased down to 5.  6:18 AM  CXR reviewed- improving.  Informed by RN that pt's FS have been at goal while on the insulin gtt.  Pt received 186.21ml of insulin.   Plan> Transition to Levemir 30 units BID and sliding scale q4hours.  Pt is tolerating tube feeds.

## 2019-02-08 NOTE — Progress Notes (Signed)
Pt placed back on full vent support at 1600 due to increased WOB.  PT is tolerating well.  RT will continue to monitor.

## 2019-02-08 NOTE — Progress Notes (Signed)
Patient ID: Gabrielle Wright, female   DOB: Apr 29, 1946, 73 y.o.   MRN: [id number]          [facility]    Date of Admission:  02/03/2019    Total days of antibiotics 6        Day 5 of penicillin  Gabrielle Wright has acute rhinovirus infection complicated by group A streptococcal bacteremia.  She seems to be making some slow progress.  She has defervesced and repeat blood cultures are negative.  I recommend 14 days total antibiotic therapy for her bacteremia.  We can consider conversion to oral antibiotic therapy if she improves.  I will follow-up on 02/10/2019.  Cliffton Asters, MD [facility] for Infectious Disease [facility] [phone] pager   [phone] cell 02/08/2019, 2:21 PM

## 2019-02-09 ENCOUNTER — Inpatient Hospital Stay (HOSPITAL_COMMUNITY): Payer: Medicaid Other

## 2019-02-09 ENCOUNTER — Encounter (HOSPITAL_COMMUNITY)
Admission: EM | Disposition: A | Discharge status: Rehabilitation Facility | Payer: Self-pay | Source: Home / Self Care | Attending: Pulmonary Disease

## 2019-02-09 DIAGNOSIS — I2510 Atherosclerotic heart disease of native coronary artery without angina pectoris: Secondary | ICD-10-CM

## 2019-02-09 DIAGNOSIS — I5043 Acute on chronic combined systolic (congestive) and diastolic (congestive) heart failure: Secondary | ICD-10-CM

## 2019-02-09 DIAGNOSIS — R9431 Abnormal electrocardiogram [ECG] [EKG]: Secondary | ICD-10-CM

## 2019-02-09 HISTORY — PX: LEFT HEART CATH AND CORONARY ANGIOGRAPHY: CATH118249

## 2019-02-09 HISTORY — PX: CORONARY/GRAFT ACUTE MI REVASCULARIZATION: CATH118305

## 2019-02-09 LAB — CBC
HCT: 28.2 % — ABNORMAL LOW (ref 36.0–46.0)
Hemoglobin: 8.3 g/dL — ABNORMAL LOW (ref 12.0–15.0)
MCH: 23.4 pg — ABNORMAL LOW (ref 26.0–34.0)
MCHC: 29.4 g/dL — ABNORMAL LOW (ref 30.0–36.0)
MCV: 79.7 fL — ABNORMAL LOW (ref 80.0–100.0)
Platelets: 165 10*3/uL (ref 150–400)
RBC: 3.54 MIL/uL — ABNORMAL LOW (ref 3.87–5.11)
RDW: 17.8 % — ABNORMAL HIGH (ref 11.5–15.5)
WBC: 13.6 10*3/uL — AB (ref 4.0–10.5)
nRBC: 0.2 % (ref 0.0–0.2)

## 2019-02-09 LAB — CULTURE, BLOOD (ROUTINE X 2)
Culture: NO GROWTH
Culture: NO GROWTH
Special Requests: ADEQUATE
Special Requests: ADEQUATE

## 2019-02-09 LAB — POCT I-STAT 7, (LYTES, BLD GAS, ICA,H+H)
Acid-base deficit: 1 mmol/L (ref 0.0–2.0)
Bicarbonate: 23.5 mmol/L (ref 20.0–28.0)
Calcium, Ion: 1.17 mmol/L (ref 1.15–1.40)
HCT: 26 % — ABNORMAL LOW (ref 36.0–46.0)
Hemoglobin: 8.8 g/dL — ABNORMAL LOW (ref 12.0–15.0)
O2 Saturation: 94 %
PCO2 ART: 36.9 mmHg (ref 32.0–48.0)
PH ART: 7.411 (ref 7.350–7.450)
Potassium: 4.8 mmol/L (ref 3.5–5.1)
Sodium: 133 mmol/L — ABNORMAL LOW (ref 135–145)
TCO2: 25 mmol/L (ref 22–32)
pO2, Arterial: 71 mmHg — ABNORMAL LOW (ref 83.0–108.0)

## 2019-02-09 LAB — GLUCOSE, CAPILLARY
GLUCOSE-CAPILLARY: 418 mg/dL — AB (ref 70–99)
GLUCOSE-CAPILLARY: 432 mg/dL — AB (ref 70–99)
Glucose-Capillary: 247 mg/dL — ABNORMAL HIGH (ref 70–99)
Glucose-Capillary: 277 mg/dL — ABNORMAL HIGH (ref 70–99)
Glucose-Capillary: 302 mg/dL — ABNORMAL HIGH (ref 70–99)
Glucose-Capillary: 318 mg/dL — ABNORMAL HIGH (ref 70–99)
Glucose-Capillary: 334 mg/dL — ABNORMAL HIGH (ref 70–99)
Glucose-Capillary: 439 mg/dL — ABNORMAL HIGH (ref 70–99)
Glucose-Capillary: 440 mg/dL — ABNORMAL HIGH (ref 70–99)
Glucose-Capillary: 504 mg/dL (ref 70–99)

## 2019-02-09 LAB — BASIC METABOLIC PANEL
ANION GAP: 11 (ref 5–15)
BUN: 56 mg/dL — ABNORMAL HIGH (ref 8–23)
CO2: 23 mmol/L (ref 22–32)
Calcium: 8.1 mg/dL — ABNORMAL LOW (ref 8.9–10.3)
Chloride: 99 mmol/L (ref 98–111)
Creatinine, Ser: 1.67 mg/dL — ABNORMAL HIGH (ref 0.44–1.00)
GFR calc non Af Amer: 30 mL/min — ABNORMAL LOW (ref >60)
GFR, EST AFRICAN AMERICAN: 35 mL/min — AB (ref >60)
Glucose, Bld: 472 mg/dL — ABNORMAL HIGH (ref 70–99)
Potassium: 4.5 mmol/L (ref 3.5–5.1)
Sodium: 133 mmol/L — ABNORMAL LOW (ref 135–145)

## 2019-02-09 LAB — MAGNESIUM: Magnesium: 2.2 mg/dL (ref 1.7–2.4)

## 2019-02-09 LAB — ECHOCARDIOGRAM COMPLETE
Height: 66 in
Weight: 2860.69 oz

## 2019-02-09 LAB — TROPONIN I
Troponin I: 9.14 ng/mL (ref <0.03)
Troponin I: 10.62 ng/mL (ref <0.03)

## 2019-02-09 LAB — PHOSPHORUS: Phosphorus: 4.7 mg/dL — ABNORMAL HIGH (ref 2.5–4.6)

## 2019-02-09 SURGERY — CORONARY/GRAFT ACUTE MI REVASCULARIZATION
Anesthesia: LOCAL

## 2019-02-09 MED ORDER — MIDAZOLAM HCL 2 MG/2ML IJ SOLN
INTRAMUSCULAR | Status: AC
  Filled 2019-02-09: qty 4

## 2019-02-09 MED ORDER — POTASSIUM CHLORIDE 20 MEQ/15ML (10%) PO SOLN
40.0000 meq | Freq: Three times a day (TID) | ORAL | Status: AC
  Administered 2019-02-09 (×2): 40 meq
  Filled 2019-02-09 (×2): qty 30

## 2019-02-09 MED ORDER — SODIUM CHLORIDE 0.9% FLUSH: 3.0000 mL | INTRAVENOUS | Status: DC | PRN

## 2019-02-09 MED ORDER — SODIUM CHLORIDE 0.9 % IV SOLN
INTRAVENOUS | Status: AC | PRN
  Administered 2019-02-09: 10 mL/h via INTRAVENOUS

## 2019-02-09 MED ORDER — SODIUM CHLORIDE 0.9 % IV SOLN
250.0000 mL | INTRAVENOUS | Status: DC | PRN
  Administered 2019-02-10: 250 mL via INTRAVENOUS

## 2019-02-09 MED ORDER — LIDOCAINE HCL (PF) 1 % IJ SOLN
INTRAMUSCULAR | Status: AC
  Filled 2019-02-09: qty 30

## 2019-02-09 MED ORDER — INSULIN DETEMIR 100 UNIT/ML ~~LOC~~ SOLN
5.0000 [IU] | Freq: Once | SUBCUTANEOUS | Status: AC
  Administered 2019-02-09: 5 [IU] via SUBCUTANEOUS
  Filled 2019-02-09: qty 0.05

## 2019-02-09 MED ORDER — NITROGLYCERIN 1 MG/10 ML FOR IR/CATH LAB
INTRA_ARTERIAL | Status: AC
  Filled 2019-02-09: qty 10

## 2019-02-09 MED ORDER — INSULIN DETEMIR 100 UNIT/ML ~~LOC~~ SOLN
35.0000 [IU] | Freq: Two times a day (BID) | SUBCUTANEOUS | Status: DC
  Administered 2019-02-09 – 2019-02-10 (×3): 35 [IU] via SUBCUTANEOUS
  Filled 2019-02-09 (×3): qty 0.35

## 2019-02-09 MED ORDER — FUROSEMIDE 10 MG/ML IJ SOLN
40.0000 mg | Freq: Four times a day (QID) | INTRAMUSCULAR | Status: AC
  Administered 2019-02-09 (×3): 40 mg via INTRAVENOUS
  Filled 2019-02-09 (×3): qty 4

## 2019-02-09 MED ORDER — IOHEXOL 350 MG/ML SOLN
INTRAVENOUS | Status: DC | PRN
  Administered 2019-02-09: 35 mL via INTRA_ARTERIAL

## 2019-02-09 MED ORDER — HEPARIN SODIUM (PORCINE) 1000 UNIT/ML IJ SOLN
INTRAMUSCULAR | Status: AC
  Filled 2019-02-09: qty 1

## 2019-02-09 MED ORDER — HEPARIN SODIUM (PORCINE) 1000 UNIT/ML IJ SOLN
INTRAMUSCULAR | Status: DC | PRN
  Administered 2019-02-09: 3000 [IU] via INTRAVENOUS

## 2019-02-09 MED ORDER — VERAPAMIL HCL 2.5 MG/ML IV SOLN
INTRAVENOUS | Status: AC
  Filled 2019-02-09: qty 2

## 2019-02-09 MED ORDER — HEPARIN (PORCINE) IN NACL 1000-0.9 UT/500ML-% IV SOLN
INTRAVENOUS | Status: DC | PRN
  Administered 2019-02-09 (×2): 500 mL

## 2019-02-09 MED ORDER — SODIUM CHLORIDE 0.9 % IV SOLN
INTRAVENOUS | Status: AC
  Administered 2019-02-09: 16:00:00 via INTRAVENOUS

## 2019-02-09 MED ORDER — HEPARIN (PORCINE) IN NACL 1000-0.9 UT/500ML-% IV SOLN
INTRAVENOUS | Status: AC
  Filled 2019-02-09: qty 1000

## 2019-02-09 MED ORDER — VERAPAMIL HCL 2.5 MG/ML IV SOLN
INTRAVENOUS | Status: DC | PRN
  Administered 2019-02-09: 10 mL via INTRA_ARTERIAL

## 2019-02-09 MED ORDER — ONDANSETRON HCL 4 MG/2ML IJ SOLN
4.0000 mg | Freq: Four times a day (QID) | INTRAMUSCULAR | Status: DC | PRN
  Administered 2019-02-16 – 2019-03-23 (×7): 4 mg via INTRAVENOUS
  Filled 2019-02-09 (×8): qty 2

## 2019-02-09 MED ORDER — ACETAMINOPHEN 325 MG PO TABS
650.0000 mg | ORAL_TABLET | ORAL | Status: DC | PRN
  Administered 2019-02-19 – 2019-03-01 (×2): 650 mg via ORAL
  Filled 2019-02-09 (×2): qty 2

## 2019-02-09 MED ORDER — SODIUM CHLORIDE 0.9% FLUSH
3.0000 mL | Freq: Two times a day (BID) | INTRAVENOUS | Status: DC
  Administered 2019-02-09: 3 mL via INTRAVENOUS

## 2019-02-09 MED ORDER — LIDOCAINE HCL (PF) 1 % IJ SOLN
INTRAMUSCULAR | Status: DC | PRN
  Administered 2019-02-09: 2 mL

## 2019-02-09 SURGICAL SUPPLY — 14 items
CATH INFINITI 4FR JL3.5 (CATHETERS) ×2 IMPLANT
CATH INFINITI MULTIPACK ANG 4F (CATHETERS) ×2 IMPLANT
DEVICE RAD COMP TR BAND LRG (VASCULAR PRODUCTS) ×2 IMPLANT
GLIDESHEATH SLEND A-KIT 6F 22G (SHEATH) ×2 IMPLANT
GLIDESHEATH SLEND SS 6F .021 (SHEATH) IMPLANT
GUIDEWIRE INQWIRE 1.5J.035X260 (WIRE) ×1 IMPLANT
INQWIRE 1.5J .035X260CM (WIRE) ×2
KIT ENCORE 26 ADVANTAGE (KITS) ×2 IMPLANT
KIT HEART LEFT (KITS) ×2 IMPLANT
PACK CARDIAC CATHETERIZATION (CUSTOM PROCEDURE TRAY) ×2 IMPLANT
SHEATH PROBE COVER 6X72 (BAG) ×2 IMPLANT
TRANSDUCER W/STOPCOCK (MISCELLANEOUS) ×2 IMPLANT
TUBING CIL FLEX 10 FLL-RA (TUBING) ×2 IMPLANT
WIRE HI TORQ VERSACORE-J 145CM (WIRE) ×2 IMPLANT

## 2019-02-09 NOTE — Progress Notes (Signed)
Chaplain responded to Urgent Cath; family not yet aware of situation.  At appropriate time, please check with patient/family if a spiritual care visit would be appreciated and page chaplain.      02/09/19 1100  Clinical Encounter Type  Visited With Patient not available  Visit Type Initial  Consult/Referral To  (Urgent cath)  Stress Factors  Patient Stress Factors Health changes  Family Stress Factors Lack of knowledge

## 2019-02-09 NOTE — Progress Notes (Signed)
While nurse was in the room the patients ETT filled with blood tinged secretions, vitals as follows, nurse suctioned tube and call for RT and MD.   02/09/19 1010  Vitals  Pulse Rate (!) 138  ECG Heart Rate (!) 137  Resp (!) 32  Oxygen Therapy  SpO2 (!) 87 %

## 2019-02-09 NOTE — Progress Notes (Signed)
PT placed back on full vent support due to SOB and desaturation.  Pt is on 50% and 10PEEP per MD.  RT will continue to monitor.

## 2019-02-09 NOTE — Progress Notes (Signed)
Elink notified of pts blood sugar of 418. OTO of Levemir 5 units given, along with 20u of sliding scale. Will continue to monitor.

## 2019-02-09 NOTE — Progress Notes (Signed)
Elink notified of pts blood sugar of 439. Will give sliding scale coverage and continue to monitor.

## 2019-02-09 NOTE — Interval H&P Note (Signed)
Cath Lab Visit (complete for each Cath Lab visit)  Clinical Evaluation Leading to the Procedure:   ACS: No.  Non-ACS:    Anginal Classification: No Symptoms  Anti-ischemic medical therapy: No Therapy  Non-Invasive Test Results: No non-invasive testing performed  Prior CABG: No previous CABG      History and Physical Interval Note:  02/09/2019 12:28 PM  Gabrielle Wright  has presented today for surgery, with the diagnosis of STEMI.  The various methods of treatment have been discussed with the patient and family. After consideration of risks, benefits and other options for treatment, the patient has consented to  Procedure(s): Coronary/Graft Acute MI Revascularization (N/A) LEFT HEART CATH AND CORONARY ANGIOGRAPHY (N/A) as a surgical intervention.  The patient's history has been reviewed, patient examined, no change in status, stable for surgery.  I have reviewed the patient's chart and labs.  Questions were answered to the patient's satisfaction.     Lyn Records III

## 2019-02-09 NOTE — H&P (View-Only) (Signed)
 NAME:  Gabrielle Wright, MRN:  [id number], DOB:  01/14/1946, LOS: 5 ADMISSION DATE:  02/03/2019, CONSULTATION DATE: 02/04/2019 REFERRING MD: Emergency department physician cHIEF COMPLAINT: Fever respiratory failure  Brief History   73-year-old female refractory hypotension and respiratory distress required intubation on 02/04/2019  History of present illness   73-year-old female is visiting from [place].  Noted to have a high fever 103.9.  Reported to have cough.  Taken to [place] [facility] per family.  Was supported to have a urinary tract infection although her urine was clean.  She never filled her prescription for antibiotics.  Due to persistent hypotension and refractory nature of her hypertension she is transferred to [facility] 02/04/2019.  She was in respiratory distress for abdominal chest wall paradoxus.  She required intubation and central line placement.  She had maxed out on peripheral IV vasopressors therefore central line was placed.  Pulmonary critical care will be responsible for care at this time.  She was placed in isolation since she came from a 42 entry of [place] with recent reports of coronavirus and that country.  Note [place] is not on the list of infected countries.  Infectious disease was called and they reported again diagnosis done on 1 reported countries.  Past Medical History  Hypertension Coronary artery disease Suspected diabetes review of her arterial consult receiving correct.  Significant Hospital IsEvents   02/04/2019 transfer from [facility] to [place] required intubation and pressors.  Consults:  02/04/2019 ID called>>  Procedures:  02/04/2019 intubation>> 02/04/2019 and central line placement>>  Significant Diagnostic Tests:  Patient 2020 chest x-ray essentially clear  Micro Data:  02/04/2019 blood cultures x2>> 02/04/2019 sputum culture>> 02/04/2019 urine culture>> 02/04/2019 respiratory virus panel>> 02/04/2019 flu a  and B>>  Possible coronavirus testing 02/04/2019>>   Antimicrobials:  02/04/2019 vancomycin>> 02/04/2019 Zosyn>>  Interim history/subjective:  Much more alert this AM, weaning since 7 AM  Objective   Blood pressure (!) 107/54, pulse 80, temperature 98.6 F (37 C), temperature source Oral, resp. rate 16, height 5' 6" (1.676 m), weight 81.1 kg, SpO2 97 %.    Vent Mode: CPAP;PSV FiO2 (%):  [40 %] 40 % Set Rate:  [16 bmp] 16 bmp Vt Set:  [550 mL] 550 mL PEEP:  [5 cmH20] 5 cmH20 Pressure Support:  [5 cmH20-10 cmH20] 10 cmH20 Plateau Pressure:  [24 cmH20-26 cmH20] 24 cmH20   Intake/Output Summary (Last 24 hours) at 02/09/2019 0940 Last data filed at 02/09/2019 0800 Gross per 24 hour  Intake 3287.36 ml  Output 2215 ml  Net 1072.36 ml   Filed Weights   02/07/19 0500 02/08/19 0408 02/09/19 0349  Weight: 86.4 kg 82.2 kg 81.1 kg   Examination: General: Well appearing, NAD HENT: [place]/AT, PERRL, EOM-I and MMM Lungs: Coarse  Cardiovascular: RRR, Nl S1/S2 and -M/R/G Abdomen: Soft, NT, ND and +BS Extremities: Warm and dry without edema Neuro: Mildly sedate but alert and interactive  I reviewed CXR myself, ETT is in a good position  Resolved Hospital Problem list     Assessment & Plan:  Hypotension in the setting of fever with continuation of diuretics and potassium supplements and antihypertensives. - KVO IVF - D/C pressors - CVP monitoring - Empiric abx ordered - Viral testing empirically negative for COVID, positive for rhino and entero  Acute respiratory distress in the setting of fever 103.9 with clear chest x-ray.  Obvious abdominal chest wall paradoxus prior to intubation - SBT to extubate today if more awake -   Titrate O2 for sat of 88-92% - BD PRN - Lasix as ordered   Suspected viral infection cannot rule out coronavirus at this time. - Isolation d/ced since COVID is negative - Respiratory virus panel positive for rhino and entero - Continue abx as ordered  Acute  renal insufficiency Lab Results  Component Value Date   CREATININE 1.67 (H) 02/09/2019   CREATININE 1.41 (H) 02/08/2019   CREATININE 1.44 (H) 02/07/2019  In the setting of chronic diuresis therapy ingestion of medications while sick. - Monitor renal function - Avoid nephrotoxins  Hyperkalemia Recent Labs  Lab 02/08/19 0410 02/09/19 0426 02/09/19 0511  K 4.4 4.8 4.5  - Monitor potassium - Monitor renal function - BMET in AM  History of coronary disease, hypertension - Check twelve-lead for completeness  Hyperglycemia CBG (last 3)  Recent Labs    02/09/19 0318 02/09/19 0320 02/09/19 0756  GLUCAP 440* 439* 302*  - Sliding scale insulin - Check hemoglobin A1c  Plan to extubate today  Labs   CBC: Recent Labs  Lab 02/03/19 2354  02/05/19 0739 02/06/19 0421  02/07/19 0242 02/08/19 0318 02/08/19 0410 02/09/19 0426 02/09/19 0511  WBC 10.3   < > 21.3* 20.2*  --  16.1*  --  18.1*  --  13.6*  NEUTROABS 9.5*  --   --   --   --   --   --   --   --   --   HGB 11.6*   < > 10.1* 10.0*   < > 9.5* 9.2* 8.9* 8.8* 8.3*  HCT 38.9   < > 33.3* 34.2*   < > 30.5* 27.0* 28.2* 26.0* 28.2*  MCV 81.0   < > 81.4 83.2  --  79.2*  --  78.6*  --  79.7*  PLT 187   < > 167 181  --  153  --  160  --  165   < > = values in this interval not displayed.    Basic Metabolic Panel: Recent Labs  Lab 02/05/19 0739 02/06/19 0549  02/07/19 0242 02/08/19 0318 02/08/19 0410 02/09/19 0426 02/09/19 0511  NA 139 133*   < > 136 139 139 133* 133*  K 4.0 5.3*   < > 3.0* 4.2 4.4 4.8 4.5  CL 111 111  --  110  --  106  --  99  CO2 19* 18*  --  19*  --  24  --  23  GLUCOSE 167* 391*  --  254*  --  133*  --  472*  BUN 21 24*  --  27*  --  32*  --  56*  CREATININE 1.43* 1.35*  --  1.44*  --  1.41*  --  1.67*  CALCIUM 7.1* 7.1*  --  7.7*  --  8.6*  --  8.1*  MG 1.4* 2.6*  --  2.1  --  1.9  --  2.2  PHOS 2.6 3.0  --  1.8*  --  3.5  --  4.7*   < > = values in this interval not displayed.   GFR:  Estimated Creatinine Clearance: 32.7 mL/min (A) (by C-G formula based on SCr of 1.67 mg/dL (H)). Recent Labs  Lab 02/03/19 2354 02/04/19 0254 02/04/19 0904  02/06/19 0421 02/07/19 0242 02/08/19 0410 02/09/19 0511  PROCALCITON  --   --  20.03  --   --   --   --   --   WBC 10.3  --  46.3*   < >  20.2* 16.1* 18.1* 13.6*  LATICACIDVEN 1.9 1.7  --   --   --   --   --   --    < > = values in this interval not displayed.    Liver Function Tests: Recent Labs  Lab 02/03/19 2354 02/04/19 0904  AST 29 33  ALT 16 17  ALKPHOS 66 53  BILITOT 0.5 1.2  PROT 7.6 5.8*  ALBUMIN 4.0 2.8*   No results for input(s): LIPASE, AMYLASE in the last 168 hours. No results for input(s): AMMONIA in the last 168 hours.  ABG    Component Value Date/Time   PHART 7.411 02/09/2019 0426   PCO2ART 36.9 02/09/2019 0426   PO2ART 71.0 (L) 02/09/2019 0426   HCO3 23.5 02/09/2019 0426   TCO2 25 02/09/2019 0426   ACIDBASEDEF 1.0 02/09/2019 0426   O2SAT 94.0 02/09/2019 0426     Coagulation Profile: Recent Labs  Lab 02/04/19 0904  INR 1.3*    Cardiac Enzymes: No results for input(s): CKTOTAL, CKMB, CKMBINDEX, TROPONINI in the last 168 hours.  HbA1C: Hgb A1c MFr Bld  Date/Time Value Ref Range Status  02/04/2019 09:04 AM 10.1 (H) 4.8 - 5.6 % Final    Comment:    (NOTE) Pre diabetes:          5.7%-6.4% Diabetes:              >6.4% Glycemic control for   <7.0% adults with diabetes     CBG: Recent Labs  Lab 02/09/19 0114 02/09/19 0116 02/09/19 0318 02/09/19 0320 02/09/19 0756  GLUCAP 432* 418* 440* 439* 302*    Review of Systems:   History of coronary artery disease Hypertension But she is on Metroprolol Cardizem and potassium and Lasix.  Past Medical History  She,  has a past medical history of Coronary artery disease, Diabetes mellitus without complication (HCC), and Hypertension.   Surgical History    Past Surgical History:  Procedure Laterality Date  . CORONARY ANGIOPLASTY  WITH STENT PLACEMENT       Social History   reports that she has never smoked. She has never used smokeless tobacco. She reports that she does not drink alcohol or use drugs.   Family History   Her family history is not on file.   Allergies No Known Allergies   Home Medications  Prior to Admission medications   Not on File    No family bedside  The patient is critically ill with multiple organ systems failure and requires high complexity decision making for assessment and support, frequent evaluation and titration of therapies, application of advanced monitoring technologies and extensive interpretation of multiple databases.   Critical Care Time devoted to patient care services described in this note is  34  Minutes. This time reflects time of care of this signee Dr Koren Bound. This critical care time does not reflect procedure time, or teaching time or supervisory time of PA/NP/Med student/Med Resident etc but could involve care discussion time.  Alyson Reedy, M.D. [facility] Pulmonary/Critical Care Medicine. Pager: [phone]. After hours pager: [phone].

## 2019-02-09 NOTE — Progress Notes (Signed)
NAME:  Gabrielle Wright, MRN:  [id number], DOB:  1946/03/14, LOS: 5 ADMISSION DATE:  02/03/2019, CONSULTATION DATE: 02/04/2019 REFERRING MD: Emergency department physician cHIEF COMPLAINT: Fever respiratory failure  Brief History   73 year old female refractory hypotension and respiratory distress required intubation on 02/04/2019  History of present illness   73 year old female is visiting from [place].  Noted to have a high fever 103.9.  Reported to have cough.  Taken to [facility] per family.  Was supported to have a urinary tract infection although her urine was clean.  She never filled her prescription for antibiotics.  Due to persistent hypotension and refractory nature of her hypertension she is transferred to [facility] 02/04/2019.  She was in respiratory distress for abdominal chest wall paradoxus.  She required intubation and central line placement.  She had maxed out on peripheral IV vasopressors therefore central line was placed.  Pulmonary critical care will be responsible for care at this time.  She was placed in isolation since she came from a [place] entry of [place] with recent reports of coronavirus and that country.  Note [place] is not on the list of infected countries.  Infectious disease was called and they reported again diagnosis done on 1 reported countries.  Past Medical History  Hypertension Coronary artery disease Suspected diabetes review of her arterial consult receiving correct.  Significant Hospital IsEvents   02/04/2019 transfer from [facility] to [facility] required intubation and pressors.  Consults:  02/04/2019 ID called>>  Procedures:  02/04/2019 intubation>> 02/04/2019 and central line placement>>  Significant Diagnostic Tests:  Patient 2020 chest x-ray essentially clear  Micro Data:  02/04/2019 blood cultures x2>> 02/04/2019 sputum culture>> 02/04/2019 urine culture>> 02/04/2019 respiratory virus panel>> 02/04/2019 flu a  and B>>  Possible coronavirus testing 02/04/2019>>   Antimicrobials:  02/04/2019 vancomycin>> 02/04/2019 Zosyn>>  Interim history/subjective:  Much more alert this AM, weaning since 7 AM  Objective   Blood pressure (!) 107/54, pulse 80, temperature 98.6 F (37 C), temperature source Oral, resp. rate 16, height 5\' 6"  (1.676 m), weight 81.1 kg, SpO2 97 %.    Vent Mode: CPAP;PSV FiO2 (%):  [40 %] 40 % Set Rate:  [16 bmp] 16 bmp Vt Set:  [550 mL] 550 mL PEEP:  [5 cmH20] 5 cmH20 Pressure Support:  [5 cmH20-10 cmH20] 10 cmH20 Plateau Pressure:  [24 cmH20-26 cmH20] 24 cmH20   Intake/Output Summary (Last 24 hours) at 02/09/2019 0940 Last data filed at 02/09/2019 0800 Gross per 24 hour  Intake 3287.36 ml  Output 2215 ml  Net 1072.36 ml   Filed Weights   02/07/19 0500 02/08/19 0408 02/09/19 0349  Weight: 86.4 kg 82.2 kg 81.1 kg   Examination: General: Well appearing, NAD HENT: Devol/AT, PERRL, EOM-I and MMM Lungs: Coarse  Cardiovascular: RRR, Nl S1/S2 and -M/R/G Abdomen: Soft, NT, ND and +BS Extremities: Warm and dry without edema Neuro: Mildly sedate but alert and interactive  I reviewed CXR myself, ETT is in a good position  Resolved Hospital Problem list     Assessment & Plan:  Hypotension in the setting of fever with continuation of diuretics and potassium supplements and antihypertensives. - KVO IVF - D/C pressors - CVP monitoring - Empiric abx ordered - Viral testing empirically negative for COVID, positive for rhino and entero  Acute respiratory distress in the setting of fever 103.9 with clear chest x-ray.  Obvious abdominal chest wall paradoxus prior to intubation - SBT to extubate today if more awake -  Titrate O2 for sat of 88-92% - BD PRN - Lasix as ordered   Suspected viral infection cannot rule out coronavirus at this time. - Isolation d/ced since COVID is negative - Respiratory virus panel positive for rhino and entero - Continue abx as ordered  Acute  renal insufficiency Lab Results  Component Value Date   CREATININE 1.67 (H) 02/09/2019   CREATININE 1.41 (H) 02/08/2019   CREATININE 1.44 (H) 02/07/2019  In the setting of chronic diuresis therapy ingestion of medications while sick. - Monitor renal function - Avoid nephrotoxins  Hyperkalemia Recent Labs  Lab 02/08/19 0410 02/09/19 0426 02/09/19 0511  K 4.4 4.8 4.5  - Monitor potassium - Monitor renal function - BMET in AM  History of coronary disease, hypertension - Check twelve-lead for completeness  Hyperglycemia CBG (last 3)  Recent Labs    02/09/19 0318 02/09/19 0320 02/09/19 0756  GLUCAP 440* 439* 302*  - Sliding scale insulin - Check hemoglobin A1c  Plan to extubate today  Labs   CBC: Recent Labs  Lab 02/03/19 2354  02/05/19 0739 02/06/19 0421  02/07/19 0242 02/08/19 0318 02/08/19 0410 02/09/19 0426 02/09/19 0511  WBC 10.3   < > 21.3* 20.2*  --  16.1*  --  18.1*  --  13.6*  NEUTROABS 9.5*  --   --   --   --   --   --   --   --   --   HGB 11.6*   < > 10.1* 10.0*   < > 9.5* 9.2* 8.9* 8.8* 8.3*  HCT 38.9   < > 33.3* 34.2*   < > 30.5* 27.0* 28.2* 26.0* 28.2*  MCV 81.0   < > 81.4 83.2  --  79.2*  --  78.6*  --  79.7*  PLT 187   < > 167 181  --  153  --  160  --  165   < > = values in this interval not displayed.    Basic Metabolic Panel: Recent Labs  Lab 02/05/19 0739 02/06/19 0549  02/07/19 0242 02/08/19 0318 02/08/19 0410 02/09/19 0426 02/09/19 0511  NA 139 133*   < > 136 139 139 133* 133*  K 4.0 5.3*   < > 3.0* 4.2 4.4 4.8 4.5  CL 111 111  --  110  --  106  --  99  CO2 19* 18*  --  19*  --  24  --  23  GLUCOSE 167* 391*  --  254*  --  133*  --  472*  BUN 21 24*  --  27*  --  32*  --  56*  CREATININE 1.43* 1.35*  --  1.44*  --  1.41*  --  1.67*  CALCIUM 7.1* 7.1*  --  7.7*  --  8.6*  --  8.1*  MG 1.4* 2.6*  --  2.1  --  1.9  --  2.2  PHOS 2.6 3.0  --  1.8*  --  3.5  --  4.7*   < > = values in this interval not displayed.   GFR:  Estimated Creatinine Clearance: 32.7 mL/min (A) (by C-G formula based on SCr of 1.67 mg/dL (H)). Recent Labs  Lab 02/03/19 2354 02/04/19 0254 02/04/19 0904  02/06/19 0421 02/07/19 0242 02/08/19 0410 02/09/19 0511  PROCALCITON  --   --  20.03  --   --   --   --   --   WBC 10.3  --  46.3*   < >  20.2* 16.1* 18.1* 13.6*  LATICACIDVEN 1.9 1.7  --   --   --   --   --   --    < > = values in this interval not displayed.    Liver Function Tests: Recent Labs  Lab 02/03/19 2354 02/04/19 0904  AST 29 33  ALT 16 17  ALKPHOS 66 53  BILITOT 0.5 1.2  PROT 7.6 5.8*  ALBUMIN 4.0 2.8*   No results for input(s): LIPASE, AMYLASE in the last 168 hours. No results for input(s): AMMONIA in the last 168 hours.  ABG    Component Value Date/Time   PHART 7.411 02/09/2019 0426   PCO2ART 36.9 02/09/2019 0426   PO2ART 71.0 (L) 02/09/2019 0426   HCO3 23.5 02/09/2019 0426   TCO2 25 02/09/2019 0426   ACIDBASEDEF 1.0 02/09/2019 0426   O2SAT 94.0 02/09/2019 0426     Coagulation Profile: Recent Labs  Lab 02/04/19 0904  INR 1.3*    Cardiac Enzymes: No results for input(s): CKTOTAL, CKMB, CKMBINDEX, TROPONINI in the last 168 hours.  HbA1C: Hgb A1c MFr Bld  Date/Time Value Ref Range Status  02/04/2019 09:04 AM 10.1 (H) 4.8 - 5.6 % Final    Comment:    (NOTE) Pre diabetes:          5.7%-6.4% Diabetes:              >6.4% Glycemic control for   <7.0% adults with diabetes     CBG: Recent Labs  Lab 02/09/19 0114 02/09/19 0116 02/09/19 0318 02/09/19 0320 02/09/19 0756  GLUCAP 432* 418* 440* 439* 302*    Review of Systems:   History of coronary artery disease Hypertension But she is on Metroprolol Cardizem and potassium and Lasix.  Past Medical History  She,  has a past medical history of Coronary artery disease, Diabetes mellitus without complication (HCC), and Hypertension.   Surgical History    Past Surgical History:  Procedure Laterality Date  . CORONARY ANGIOPLASTY  WITH STENT PLACEMENT       Social History   reports that she has never smoked. She has never used smokeless tobacco. She reports that she does not drink alcohol or use drugs.   Family History   Her family history is not on file.   Allergies No Known Allergies   Home Medications  Prior to Admission medications   Not on File    No family bedside  The patient is critically ill with multiple organ systems failure and requires high complexity decision making for assessment and support, frequent evaluation and titration of therapies, application of advanced monitoring technologies and extensive interpretation of multiple databases.   Critical Care Time devoted to patient care services described in this note is  34  Minutes. This time reflects time of care of this signee Dr Koren Bound. This critical care time does not reflect procedure time, or teaching time or supervisory time of PA/NP/Med student/Med Resident etc but could involve care discussion time.  Alyson Reedy, M.D. [facility] Pulmonary/Critical Care Medicine. Pager: [phone]. After hours pager: [phone].

## 2019-02-09 NOTE — Progress Notes (Signed)
Right radial TR band removed at 1750 with no complications. Folded gauze with transparent dressing applied over site. Remains level 0.

## 2019-02-09 NOTE — Consult Note (Addendum)
Cardiology Consultation:   Patient ID: Gabrielle Wright; [id number]; [birth year]   Admit date: 02/03/2019 Date of Consult: 02/09/2019  Primary Care Provider: Rudene Anda, MD Primary Cardiologist: [place]   Patient Profile:   Gabrielle Wright is a 73 y.o. female with a hx HTN, HLD, CAD s/p STEMI in 2018 noted to have ISR of pLAD>>PCI placed and CTO of PDA, aso with hx of DM2, CKD stage III of who is being seen today for the evaluation of abnormal EKG at the request of Dr. Nelda Marseille.  History of Present Illness:   Gabrielle Wright is a 73 yo F with a hx as stated above who initially presented to [place] with elevated temp at 103.9 with associated cough found to be negative for COVID.  She goes back and forth from [place] to [place] and does not speak [place]. There are no family member at bedside at this time for history. HPI obtained from chart review. She presented 02/04/2019 with cough and fever and was treated for UTI. She never filled her antibiotic prescription for antibiotics. At [facility] she was found to be hypotensive and was transferred to [facility] for further evaluation. On arrival she was in respiratory distress and was intubation for ventilatory support. She was placed on IV pressors and has been managed by PCCM since that time. She has been doing well and plan was for extubation today however EKG was complete given abnormal tracing on telemetry monitor which showed ST depression in the inferolateral areas. Cardiology was asked assist for further evaluation. On exam, she remains intubated. An echocardiogram was ordered which was assess by attending MD. 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 It appears that she was last seen by Cardiology 06/26/2017 in follow up from a prior anterior STEMI in 03/2017 in which she underwent cath that showed late presenting ISR of pLAD and CTO of PDA. She  underwent PCI to LAD at that time with plans for DAPT with Brilinta and ASA for at least 1 year. Her initial cath was performed in 2017 in which she had a PCI to LAD and unsuccessful PCI to PDA.  Her EF at that time was noted to be 50% per echocardiogram. Home medications appear to be losartan 25, torsemide 20, plavix 75, lopressor 12.5, Lipitor 80.  Attending cardiologist has seen and examined the pt and has notified the cath lab for assistance.   Past Medical History:  Diagnosis Date   Coronary artery disease    Diabetes mellitus without complication ([place])    Hypertension     Past Surgical History:  Procedure Laterality Date   CORONARY ANGIOPLASTY WITH STENT PLACEMENT       Prior to Admission medications   Medication Sig Start Date End Date Taking? Authorizing Provider  albuterol (VENTOLIN HFA) 108 (90 Base) MCG/ACT inhaler Inhale 2 puffs into the lungs every 6 (six) hours as needed for wheezing or shortness of breath.   Yes [provider]  atorvastatin (LIPITOR) 80 MG tablet Take 80 mg by mouth at bedtime.   Yes [provider]  bisoprolol (ZEBETA) 5 MG tablet Take 5 mg by mouth daily.   Yes [provider]  Calcium Carbonate-Vitamin D3 (CALCIUM 600+D3) 600-400 MG-UNIT TABS Take 1 tablet by mouth daily.   Yes [provider]  clopidogrel (PLAVIX) 75 MG tablet Take 75 mg by mouth daily.   Yes [provider]  febuxostat (ULORIC) 40 MG tablet Take 40 mg by mouth daily.   Yes [provider]  ferrous sulfate 325 (65 FE) MG tablet Take 325 mg by mouth daily with breakfast.   Yes [provider]  furosemide (LASIX) 40 MG tablet Take 40 mg by mouth daily.   Yes [provider]  Insulin Isophane & Regular Human (HUMULIN 70/30 KWIKPEN) (70-30) 100 UNIT/ML PEN Inject 28-30 Units into the skin 2 (two) times daily.   Yes [provider]  insulin regular (HUMULIN R) 100 units/mL injection Inject 10 Units into the  skin daily with lunch.   Yes [provider]  linaGLIPtin-metFORMIN HCl 2.5-500 MG TABS Take 1 tablet by mouth daily.   Yes [provider]  montelukast (SINGULAIR) 10 MG tablet Take 10 mg by mouth at bedtime.   Yes [provider]  NON FORMULARY Take 1 tablet by mouth See admin instructions. NEVRALIP 600 RETARD- (Nevralip 600 retard is a Food Supplement containing Lipoic Acid, Chromium Picolinate, Vitamins and Minerals. Selenium, Zinc and Vitamin E contribute to the protection of cells from oxidative stress): Take 1 tablet by mouth once a day or as otherwise directed   Yes [provider]  pantoprazole (PROTONIX) 40 MG tablet Take 40 mg by mouth daily before breakfast.   Yes [provider]  pregabalin (LYRICA) 75 MG capsule Take 75 mg by mouth 2 (two) times daily.   Yes [provider]  PRESCRIPTION MEDICATION Take 16 mg by mouth See admin instructions. BETAHISTINE DIHYDROCHLORIDE 16 MG TABLETS: Take 16 mg by mouth daily or as otherwise directed   Yes [provider]  spironolactone (ALDACTONE) 50 MG tablet Take 50 mg by mouth daily.   Yes [provider]  telmisartan (MICARDIS) 80 MG tablet Take 80 mg by mouth daily.   Yes [provider]    Inpatient Medications: Scheduled Meds:  chlorhexidine gluconate (MEDLINE KIT)  15 mL Mouth Rinse BID   Chlorhexidine Gluconate Cloth  6 each Topical Q0600   furosemide  40 mg Intravenous Q6H   heparin  5,000 Units Subcutaneous Q8H   insulin aspart  0-24 Units Subcutaneous Q4H   insulin detemir  35 Units Subcutaneous BID   mouth rinse  15 mL Mouth Rinse 10 times per day   midazolam       pantoprazole (PROTONIX) IV  40 mg Intravenous QHS   polyethylene glycol  17 g Oral Daily   potassium chloride  40 mEq Per Tube TID   sodium chloride flush  10-40 mL Intracatheter Q12H   Continuous Infusions:  sodium chloride Stopped (02/08/19 0428)   dexmedetomidine  (PRECEDEX) IV infusion Stopped (02/09/19 0946)   dextrose 5 % and 0.45% NaCl Stopped (02/07/19 0946)   feeding supplement (VITAL AF 1.2 CAL) 65 mL/hr at 02/09/19 0700   fentaNYL infusion INTRAVENOUS Stopped (02/09/19 0946)   norepinephrine (LEVOPHED) Adult infusion 11.12 mcg/min (02/08/19 1400)   penicillin g continuous IV infusion 8 Million Units (02/09/19 0443)   PRN Meds: acetaminophen (TYLENOL) oral liquid 160 mg/5 mL, dextrose, fentaNYL, ondansetron (ZOFRAN) IV, sodium chloride flush  Allergies:   No Known Allergies  Social History:   Social History   Socioeconomic History   Marital status: Widowed    Spouse name: Not on file   Number of children: Not on file   Years of education: Not on file   Highest education level: Not on file  Occupational History   Not on file  Social Needs  Financial resource strain: Not on file   Food insecurity:    Worry: Not on file    Inability: Not on file   Transportation needs:    Medical: Not on file    Non-medical: Not on file  Tobacco Use   Smoking status: Never Smoker   Smokeless tobacco: Never Used  Substance and Sexual Activity   Alcohol use: Never    Frequency: Never   Drug use: Never   Sexual activity: Not on file  Lifestyle   Physical activity:    Days per week: Not on file    Minutes per session: Not on file   Stress: Not on file  Relationships   Social connections:    Talks on phone: Not on file    Gets together: Not on file    Attends religious service: Not on file    Active member of club or organization: Not on file    Attends meetings of clubs or organizations: Not on file    Relationship status: Not on file   Intimate partner violence:    Fear of current or ex partner: Not on file    Emotionally abused: Not on file    Physically abused: Not on file    Forced sexual activity: Not on file  Other Topics Concern   Not on file  Social History Narrative   Not on file    Family History:    History reviewed. No pertinent family history. Family Status:  No family status information on file.    ROS:  Please see the history of present illness.  All other ROS reviewed and negative.     Physical Exam/Data:   Vitals:   02/09/19 1036 02/09/19 1038 02/09/19 1040 02/09/19 1045  BP: (!) 85/45 (!) 84/50 (!) 87/48 (!) 89/45  Pulse: 77 84 85 83  Resp: 16 16 16 16   Temp:      TempSrc:      SpO2: 98% 98% 98% 98%  Weight:      Height:        Intake/Output Summary (Last 24 hours) at 02/09/2019 1115 Last data filed at 02/09/2019 0955 Gross per 24 hour  Intake 3110.74 ml  Output 2250 ml  Net 860.74 ml   Filed Weights   02/07/19 0500 02/08/19 0408 02/09/19 0349  Weight: 86.4 kg 82.2 kg 81.1 kg   Body mass index is 28.86 kg/m.   EKG:  The EKG was personally reviewed and demonstrates: 02/09/2019 NSR with ST depression in inferolateral leads  Telemetry:  Telemetry was personally reviewed and demonstrates:  02/09/2019 NSR with ST depression on telemetry   Relevant CV Studies:  ECHO: 07/06/2017:                 Procedure Type of Study  TTE procedure: ECHOCARDIOGRAM FOLLOW UP/ LIMITED W DOPPLER. Procedure date Date: 07/06/2017 Start: 09:08 AM Technical Quality: Adequate visualization Study Location: Portable Patient Status: Routine Height: 64.96 inches Weight: 163.14 pounds BSA: 1.81 m2 BMI: 27.18 kg/m2 BP: 105/53 mmHg Conclusions Summary Limited study Ejection fraction is visually estimated at 50% with mild anterior apical and anteroseptal wall hypokinesis No significant changes in comparison to prior echo dated 04/25/2017 Signature ------------------------------------------------------------------------------  Electronically signed by Rozann Lesches, MD(Interpreting physician) on 07/06/2017  11:11 AM ------------------------------------------------------------------------------ Findings Left Ventricle Limited study Ejection fraction is visually  estimated at 50% with mild anterior apical and anteroseptal wall hypokinesis M-Mode/2D Measurements & Calculations  LV Diastolic Dimension: 2.68 cm LV Systolic Dimension: 3.41 cm  LV  FS:19.25 %          LV Volume Diastolic: 75.6 ml  LV PW Diastolic: 4.33 cm    LV Volume Systolic: 29.5 ml  Septum Diastolic: 1.88 cm    LV EDV/LV EDV Index: 62.3 ml/34 m2LV ESV/LV                  ESV Index: 42.8 ml/24 m2                  EF Calculated: 31.3 % Doppler Measurements & Calculations  MV Peak E-Wave: 140 cm/s  MV Peak A-Wave: 59 cm/s  MV E/A Ratio: 2.37  MV Peak Gradient: 7.84 mmHg [address] [place], [place] [address] [phone]  CATH: 03/2017: Diagnostic Summary 1. Moderate ISR of prox LAD (STEMI by EKG) 2. Moderate/severe distal LAD lesion (unchanged) 3. CTO R-PDA (known, failed PCI in the past) 4. Normal LV function. 5. Aortogram was performed to r/o aortic dissection due to severe chest pain, no dissection noted. Diagnostic Recommendations PCI to LAD Interventional Summary Successful angioplasty to proximal Left Anterior Descending Coronary Artery. IVUS was used to guide therapy. Interventional Recommendations 1. Dual anti-platelet therapy with Ticagrelor and Aspirin 81 mg for at least 12 months is recommended. 2. After PTCA with good results, pt cont to have severe chest pain. Will obtain CT of chest to r/o PE or pneumonia I have reviewed the recent history and physical documentation. I personally spent 45 minutes continuously monitoring the patient during the administration of moderate sedation. Pre and post activities have been reviewed. I was present for the entire procedure.  Signatures  Electronically signed by Rozann Lesches, MD(Interventional Physician)  on 04/18/2017 17:15  Angiographic Findings  Cardiac Arteries and Lesion Findings LMCA: 0%. LAD:  Lesion on Prox LAD: Mid subsection.60% stenosis 8 mm length reduced to  0%.  Pre procedure TIMI III flow was noted. Post Procedure TIMI III flow was  present. Good run off was present. The lesion was diagnosed as Moderate  Risk (B).  Devices used  - Abbott 0.014" x 190cm BMW J-Tip PTCI Guidewire. Number of passes: 1.  - [employer] Platinum RX Digital IVUS Catheter. Number of passes: 1.  - 3.0x12m [place] Euphora. 2 inflation(s) to a max pressure of: 18 atm.  Lesion on Dist LAD: 80% stenosis. LCx:  Lesion on Prox CX: 20% stenosis.  Lesion on 1st Ob Marg: 40% stenosis. RCA:  Lesion on R PDA: Mid subsection.100% stenosis.  Lesion on 1st Ac Marg: 80% stenosis. Procedure Data Procedure Date Date: 04/18/2017 Start: 15:22 Entry Locations  - Retrograde Percutaneous access was performed through the Right Femoral   artery. A 6 Fr sheath was inserted. Contrast Material  - Omnipaque141 ml Fluoroscopy Time: Diagnostic: 0:00 minutes. PCI: 5:01 minutes. Total: 5:01 minutes. Admission Data Admission Status: Emergency department  Coronary Tree  Dominance: Right VA LV function assessed aAT:FTDDUK Ejection Fraction  - Method: LV gram. EF%: 65.  Cath 11/30/2015:  Procedure Procedure Type  Diagnostic procedure:  PCI procedure: Complications: No Complications. Conclusions Diagnostic Summary I have reviewed the recent history and physical documentation. I personally spent 45 minutes continuously monitoring the patient during the administration of moderate sedation. Pre and post activities have been reviewed. I was present for the entire procedure. Chronic Total Occlusion of the PDA Severe stenosis of the LAD at proximal end of old stent Otherwise non-obstructive coronary artery disease. Normal LV function Interventional Summary Successful PCI / Xience Drug Eluting Stent of the proximal Left Anterior Descending  Coronary Artery. Unsuccessful PCI of the distal Posterior Descending Coronary Artery. Unsuccessful PCI due to failure to cross with  guidewire lesion appears chronic, small. Interventional Recommendations Medical therapy for CAD Risk factor reduction Dual anti-platelet therapy with Clopidogrel and Aspirin 81 mg for at least 6 months is recommended.  Signatures  Electronically signed by Clarene Critchley, MD,  FACC(Interventional Physician) on 11/30/2015 11:50  Angiographic Findings  Cardiac Arteries and Lesion Findings LAD:  Lesion on Prox LAD: 75% stenosis 6 mm length reduced to 0%. Pre procedure  TIMI III flow was noted. Post Procedure TIMI III flow was present. Good run  off was present. The lesion was diagnosed as Low Risk (A).  Devices used  - Abbott 0.014" x 190cm BMW J-Tip PTCI Guidewire. Number of passes: 1.  - Xience Alpine 2.5x76m Everolimus DES . (Primary Device)Length: 12 mm.  2 inflation(s) to a max pressure of: 12 atm.  Lesion on Dist LAD: 50% stenosis 39 mm length .The lesion was diffuse. RCA:  Lesion on R PDA: 50% stenosis 6 mm length .  Lesion on R PDA: 100% stenosis 7 mm length reduced to 100%. Pre procedure  TIMI 0 flow was noted. Post Procedure TIMI 0 flow was present. Poor run off  was present. LMCA: Normal. LCx:  Lesion on Prox CX: 15% stenosis 22 mm length .  Lesion on 1st Ob Marg: 30% stenosis 6 mm length . Procedure Data Procedure Date Date: 11/30/2015 Start: 10:43 Contrast Material  - Omnipaque122 ml Fluoroscopy Time: PCI: 14:00 minutes. Total: 14:00 minutes. Admission Data Admission Date: 11/28/2015  Coronary Tree  Dominance: Right VA LV function assessed aRS:WNIOEV Ejection Fraction  - Method: Estimated. EF%: 55.  LVA Segment Contractility  1 - Normal    3 - Mild    5 - Severe   7 - Dyskinesis  hypokinesis   hypokinesis  2 - Hypokinesis 4 - Moderate  6 - Akinesis  8 - Aneurysm  hypokinesis  Hemodynamics  Condition: Rest  Heart Rate: 95 bpm Pressures +-----+------------+ !Site !Pressure  ! +-----+------------+ !LV  !183/5 ,20   ! +-----+------------+ !AO  !185/65 (114)! +-----+------------+ Laboratory Data:  Chemistry Recent Labs  Lab 02/07/19 0242  02/08/19 0410 02/09/19 0426 02/09/19 0511  NA 136   < > 139 133* 133*  K 3.0*   < > 4.4 4.8 4.5  CL 110  --  106  --  99  CO2 19*  --  24  --  23  GLUCOSE 254*  --  133*  --  472*  BUN 27*  --  32*  --  56*  CREATININE 1.44*  --  1.41*  --  1.67*  CALCIUM 7.7*  --  8.6*  --  8.1*  GFRNONAA 36*  --  37*  --  30*  GFRAA 42*  --  43*  --  35*  ANIONGAP 7  --  9  --  11   < > = values in this interval not displayed.    Total Protein  Date Value Ref Range Status  02/04/2019 5.8 (L) 6.5 - 8.1 g/dL Final   Albumin  Date Value Ref Range Status  02/04/2019 2.8 (L) 3.5 - 5.0 g/dL Final   AST  Date Value Ref Range Status  02/04/2019 33 15 - 41 U/L Final   ALT  Date Value Ref Range Status  02/04/2019 17 0 - 44 U/L Final   Alkaline Phosphatase  Date Value Ref Range Status  02/04/2019 53 38 - 126  U/L Final   Total Bilirubin  Date Value Ref Range Status  02/04/2019 1.2 0.3 - 1.2 mg/dL Final   Hematology Recent Labs  Lab 02/07/19 0242  02/08/19 0410 02/09/19 0426 02/09/19 0511  WBC 16.1*  --  18.1*  --  13.6*  RBC 3.85*  --  3.59*  --  3.54*  HGB 9.5*   < > 8.9* 8.8* 8.3*  HCT 30.5*   < > 28.2* 26.0* 28.2*  MCV 79.2*  --  78.6*  --  79.7*  MCH 24.7*  --  24.8*  --  23.4*  MCHC 31.1  --  31.6  --  29.4*  RDW 17.7*  --  18.0*  --  17.8*  PLT 153  --  160  --  165   < > = values in this interval not displayed.   Cardiac EnzymesNo results for input(s): TROPONINI in the last 168 hours. No results for input(s): TROPIPOC in the last 168 hours.  BNP Recent Labs  Lab 02/04/19 0904  BNP 599.1*    DDimer No results for input(s): DDIMER in the last 168 hours. TSH: No results found for: TSH Lipids:No results found for: CHOL, HDL, LDLCALC, LDLDIRECT, TRIG, CHOLHDL HgbA1c: Lab Results  Component Value Date   HGBA1C 10.1 (H) 02/04/2019     Radiology/Studies:  Dg Chest Port 1 View  Result Date: 02/09/2019 CLINICAL DATA:  ETT EXAM: PORTABLE CHEST 1 VIEW COMPARISON:  02/08/2019 FINDINGS: Multifocal patchy/interstitial opacities, right upper lobe predominant. Possible small bilateral pleural effusions. Overall appearance is mildly progressive. Cardiomegaly. Endotracheal tube terminates 2 cm above the carina. Left subclavian venous catheter terminates in the upper right atrium. Enteric tube courses into the mid stomach. IMPRESSION: Multifocal patchy/interstitial opacities, right upper lobe predominant, mildly progressive. This appearance favors asymmetric interstitial edema over multifocal infection. Cardiomegaly with possible small bilateral pleural effusions. Endotracheal tube terminates 2 cm above the carina. Additional support apparatus as above. Electronically Signed   By: Julian Hy M.D.   On: 02/09/2019 05:19   Dg Chest Port 1 View  Result Date: 02/08/2019 CLINICAL DATA:  Aspiration EXAM: PORTABLE CHEST 1 VIEW COMPARISON:  02/07/2019 FINDINGS: Endotracheal tube with tip measuring 3.5 cm above the carina. Enteric tube tip is off the field of view but below the left hemidiaphragm. Left central venous catheter with tip over the cavoatrial junction region. Mild cardiac enlargement. Perihilar infiltration or edema. Small bilateral pleural effusions. Mild improvement of airspace opacification since previous study. No pneumothorax. IMPRESSION: Appliances appear in satisfactory position. Cardiac enlargement with perihilar infiltration or edema. Small bilateral pleural effusions. Mild improvement since previous study. Electronically Signed   By: Lucienne Capers M.D.   On: 02/08/2019 01:19   Dg Chest Port 1 View  Result Date: 02/07/2019 CLINICAL DATA:  Endotracheal tube present. EXAM: PORTABLE CHEST 1 VIEW COMPARISON:  02/06/2019 FINDINGS: Endotracheal tube terminates 2.3 cm above the carina. Left subclavian catheter terminates over  the high right atrium. Enteric tube courses into the upper abdomen with tip not imaged. The cardiomediastinal silhouette is unchanged. Widespread airspace opacities throughout both lungs are unchanged. Small bilateral pleural effusions are suspected. No pneumothorax is identified. IMPRESSION: Unchanged diffuse airspace opacities bilaterally. Electronically Signed   By: Logan Bores M.D.   On: 02/07/2019 07:13   Dg Chest Port 1 View  Result Date: 02/06/2019 CLINICAL DATA:  Endotracheal tube in place. EXAM: PORTABLE CHEST 1 VIEW COMPARISON:  02/04/2019 FINDINGS: The patient is rotated to the left with grossly unchanged cardiomediastinal silhouette. Endotracheal tube  terminates 3 cm above the carina. A left subclavian catheter terminates near the superior cavoatrial junction. An enteric tube courses into the upper abdomen with tip not imaged. There are new widespread airspace opacities throughout both lungs in a relatively symmetric fashion. There may be small bilateral pleural effusions. No pneumothorax is identified. IMPRESSION: 1. Support devices as above. 2. New widespread bilateral airspace opacities which may reflect edema or pneumonia. Electronically Signed   By: Logan Bores M.D.   On: 02/06/2019 09:01   Assessment and Plan:   1. ST changes on EKG with prior hx of CAD s/p STEMI 2018: -Called by PCCM for abnormal EKG -Prior hx of CAD>>she was last seen by Cardiology 06/26/2017 in follow up from a prior anterior STEMI in 03/2017 in which she underwent cath that showed late presenting ISR of pLAD and CTO of PDA. She underwent PCI to LAD at that time with plans for DAPT with Brilinta and ASA for at least 1 year>>now on Plavix. Her initial cath was performed in 2017 in which she had a PCI to LAD and unsuccessful PCI to PDA.  Her EF at that time was noted to be 50% per echocardiogram. Home medications appear to be losartan 25, torsemide 20, plavix 75, lopressor 12.5, Lipitor 80 -Will trend troponin -Echo  performed with pending results -Attending cardiology has contacted on call cath lab team for further evaluation given EKG with ST depression in inferolateral leads -Home medications include losartan 25, torsemide 20, plavix 75, lopressor 12.5, Lipitor 80 -Will restart in the post cath setting   2. HLD: -Last lipid profile in 06/2017 with LDL 63 -Continue high intensity atorvastatin post cath   3. HTN: -Has issues with hypotension in the setting of acute illness with respiratory failure -Hold for now   4. Acute respiratory failure in the setting og viral illness: -ETT per PCCM  -COVID negative  -+ rhino  5. DM2: -SSI for glucose control while inpatient status -Per primary team     For questions or updates, please contact [place] Please consult [email or web address] for contact info under Cardiology/STEMI.   Lyndel Safe NP-C [facility] Pager: [phone] 02/09/2019 11:15 AM  History and all data above reviewed.  Patient examined.   Urgent consult.  Patient was being weaned from the vent when she became acutely agitated and had pink frothy sputum from the ETT.  Vent settings increased and oxygenating well but now on 100% FIO2 and sedated.  BP has fallen.  No pressors at this point.  EKG with acute change with diffuse ST depression in the inferolateral leads.  QRS widened compared with previous.  No acute ST elevation.  She does have a past cardiac history as above.  Stat echo was done with EF about 35% with apical/anterolatera/inferolateral hypokinesis.   I agree with the findings as above.  Of note she has had stenting of the LAD and other residual disease as described above.  There was mention of a mildly reduced EF with apical hypokinesis but overall EF was OK in the past.    GENERAL:  Sedated and critically ill appearing HEENT: Oral intubation NECK:  Unable to assess jugular venous distention, waveform within normal limits, carotid upstroke brisk and symmetric, no bruits,  no thyromegaly LYMPHATICS:  No cervical adenopathy LUNGS:  Decreased breath sounds CHEST:  Unremarkable HEART:  PMI not displaced or sustained,S1 and S2 within normal limits, no S3, no S4, no clicks, no rubs, no murmurs ABD:  Flat, positive bowel sounds normal in  frequency in pitch, no bruits, no rebound, no guarding, no midline pulsatile mass, no hepatomegaly, no splenomegaly EXT:  2 plus pulses throughout, mild diffuse edema, no cyanosis no clubbing SKIN:  No rashes no nodules NEURO:  Moves all extremities.  PSYCH:  Unable to assess    All available labs, radiology testing, previous records reviewed. Agree with documented assessment and plan. Acute pulmonary edema:  Suspect acute coronary syndrome vs Takotsubo.  Needs to go urgently to the cath lab.  CORS only secondary to increased creat.  Her family is in transition and not available for consent at this time.  Given IV Lasix.        Jeneen Rinks Teegan Guinther  12:19 PM  02/09/2019

## 2019-02-09 NOTE — Progress Notes (Signed)
  Echocardiogram 2D Echocardiogram has been performed.  Quantavius Humm L Androw 02/09/2019, 11:45 AM

## 2019-02-09 NOTE — Progress Notes (Signed)
eLink Physician-Brief Progress Note Patient Name: Gabrielle Wright DOB: 1946-02-14 MRN: [id number]   Date of Service  02/09/2019  HPI/Events of Note  Notified of glucose persistently over 400s despite Levemir 30  eICU Interventions  Will give additional Levemir 5 tonight and increase to 35 BID.  Patient also receiving SSI     Intervention Category Major Interventions: Hyperglycemia - active titration of insulin therapy  Rosalie Gums Chantilly Linskey 02/09/2019, 1:58 AM

## 2019-02-09 NOTE — Progress Notes (Signed)
Results for KAMREN, COOTS (MRN [id number]) as of 02/09/2019 13:26  Ref. Range 02/09/2019 12:15  Troponin I Latest Ref Range: <0.03 ng/mL 9.14 (HH)  Dr. Verdis Prime made aware as patient is at cath lab

## 2019-02-09 NOTE — Progress Notes (Signed)
SLP Cancellation Note  Patient Details Name: Gabrielle Wright MRN: [id number] DOB: February 21, 1946   Cancelled treatment:       Reason Eval/Treat Not Completed: Medical issues which prohibited therapy. Pt remains on the ventilator.   Rondel Baton, [place], CCC-SLP Speech-Language Pathologist Acute Rehabilitation Services Pager: [phone] Office: [phone]    Arlana Lindau 02/09/2019, 11:20 AM

## 2019-02-09 NOTE — CV Procedure (Addendum)
   Coronary angiography performed via right radial using ultrasound guidance and 4 French diagnostic Judkins catheters.  30% left main  20 to 30% diffuse narrowing within the proximal LAD stent and, diffuse narrowing in the distal LAD apical segment.  Proximal to mid eccentric 50% circumflex.  Patent stent in the mid to distal obtuse marginal, with 80 to 90% stenosis distal to the stent with active thrombus noted.  Patent stent in the mid circumflex.  50% mid RCA, 70% distal RCA, 100% mid PDA, left to right collaterals from apical LAD to PDA.  LVEDP 40 mmHg compatible with acute on chronic combined systolic and diastolic heart failure, in this situation being aggravated by Levophed therapy which is likely causing global ischemia.  RECOMMENDATIONS: No actionable coronary disease noted. Wean norepinephrine as possible.  Will need diuresis if pulmonary congestion is clinically present and preventing weaning.  Also, with SIRS and evidence of active intracoronary thrombus, would recommend clopidogrel therapy if no evidence of bleeding/clinical contraindication.

## 2019-02-10 ENCOUNTER — Encounter (HOSPITAL_COMMUNITY): Payer: Self-pay | Admitting: Interventional Cardiology

## 2019-02-10 ENCOUNTER — Inpatient Hospital Stay (HOSPITAL_COMMUNITY): Payer: Medicaid Other

## 2019-02-10 DIAGNOSIS — I214 Non-ST elevation (NSTEMI) myocardial infarction: Secondary | ICD-10-CM

## 2019-02-10 DIAGNOSIS — I5043 Acute on chronic combined systolic (congestive) and diastolic (congestive) heart failure: Secondary | ICD-10-CM

## 2019-02-10 DIAGNOSIS — I2510 Atherosclerotic heart disease of native coronary artery without angina pectoris: Secondary | ICD-10-CM

## 2019-02-10 LAB — BASIC METABOLIC PANEL
Anion gap: 10 (ref 5–15)
BUN: 69 mg/dL — ABNORMAL HIGH (ref 8–23)
CO2: 23 mmol/L (ref 22–32)
Calcium: 8.1 mg/dL — ABNORMAL LOW (ref 8.9–10.3)
Chloride: 99 mmol/L (ref 98–111)
Creatinine, Ser: 1.79 mg/dL — ABNORMAL HIGH (ref 0.44–1.00)
GFR calc Af Amer: 32 mL/min — ABNORMAL LOW (ref >60)
GFR calc non Af Amer: 28 mL/min — ABNORMAL LOW (ref >60)
Glucose, Bld: 425 mg/dL — ABNORMAL HIGH (ref 70–99)
Potassium: 4.7 mmol/L (ref 3.5–5.1)
Sodium: 132 mmol/L — ABNORMAL LOW (ref 135–145)

## 2019-02-10 LAB — CBC
HCT: 26.3 % — ABNORMAL LOW (ref 36.0–46.0)
Hemoglobin: 7.9 g/dL — ABNORMAL LOW (ref 12.0–15.0)
MCH: 24.4 pg — ABNORMAL LOW (ref 26.0–34.0)
MCHC: 30 g/dL (ref 30.0–36.0)
MCV: 81.2 fL (ref 80.0–100.0)
NRBC: 0.2 % (ref 0.0–0.2)
Platelets: 201 10*3/uL (ref 150–400)
RBC: 3.24 MIL/uL — ABNORMAL LOW (ref 3.87–5.11)
RDW: 18.1 % — AB (ref 11.5–15.5)
WBC: 12.7 10*3/uL — AB (ref 4.0–10.5)

## 2019-02-10 LAB — GLUCOSE, CAPILLARY
Glucose-Capillary: 280 mg/dL — ABNORMAL HIGH (ref 70–99)
Glucose-Capillary: 305 mg/dL — ABNORMAL HIGH (ref 70–99)
Glucose-Capillary: 338 mg/dL — ABNORMAL HIGH (ref 70–99)
Glucose-Capillary: 367 mg/dL — ABNORMAL HIGH (ref 70–99)
Glucose-Capillary: 372 mg/dL — ABNORMAL HIGH (ref 70–99)
Glucose-Capillary: 392 mg/dL — ABNORMAL HIGH (ref 70–99)

## 2019-02-10 LAB — TROPONIN I: Troponin I: 9.58 ng/mL (ref <0.03)

## 2019-02-10 LAB — PHOSPHORUS: PHOSPHORUS: 4.1 mg/dL (ref 2.5–4.6)

## 2019-02-10 LAB — MAGNESIUM: Magnesium: 2.4 mg/dL (ref 1.7–2.4)

## 2019-02-10 MED ORDER — FLEET ENEMA 7-19 GM/118ML RE ENEM
1.0000 | ENEMA | Freq: Once | RECTAL | Status: AC
  Administered 2019-02-10: 1 via RECTAL
  Filled 2019-02-10: qty 1

## 2019-02-10 MED ORDER — INSULIN DETEMIR 100 UNIT/ML ~~LOC~~ SOLN
45.0000 [IU] | Freq: Two times a day (BID) | SUBCUTANEOUS | Status: DC
  Administered 2019-02-10 – 2019-02-11 (×2): 45 [IU] via SUBCUTANEOUS
  Filled 2019-02-10 (×2): qty 0.45

## 2019-02-10 MED ORDER — FENTANYL CITRATE (PF) 100 MCG/2ML IJ SOLN
25.0000 ug | INTRAMUSCULAR | Status: DC | PRN
  Administered 2019-02-10 (×2): 100 ug via INTRAVENOUS
  Filled 2019-02-10 (×2): qty 2

## 2019-02-10 MED ORDER — LACTULOSE 10 GM/15ML PO SOLN
30.0000 g | Freq: Once | ORAL | Status: AC
  Administered 2019-02-10: 30 g
  Filled 2019-02-10: qty 45

## 2019-02-10 MED ORDER — CLOPIDOGREL BISULFATE 300 MG PO TABS
300.0000 mg | ORAL_TABLET | Freq: Once | ORAL | Status: AC
  Administered 2019-02-10: 300 mg via ORAL
  Filled 2019-02-10: qty 1

## 2019-02-10 MED ORDER — ASPIRIN EC 81 MG PO TBEC
81.0000 mg | DELAYED_RELEASE_TABLET | Freq: Every day | ORAL | Status: DC
  Administered 2019-02-10 – 2019-02-12 (×3): 81 mg via ORAL
  Filled 2019-02-10 (×3): qty 1

## 2019-02-10 MED ORDER — CLOPIDOGREL BISULFATE 75 MG PO TABS
75.0000 mg | ORAL_TABLET | Freq: Every day | ORAL | Status: DC
  Administered 2019-02-11 – 2019-03-03 (×21): 75 mg via ORAL
  Filled 2019-02-10 (×23): qty 1

## 2019-02-10 MED ORDER — INSULIN DETEMIR 100 UNIT/ML ~~LOC~~ SOLN
10.0000 [IU] | Freq: Once | SUBCUTANEOUS | Status: AC
  Administered 2019-02-10: 10 [IU] via SUBCUTANEOUS
  Filled 2019-02-10: qty 0.1

## 2019-02-10 MED ORDER — INSULIN ASPART 100 UNIT/ML ~~LOC~~ SOLN
5.0000 [IU] | SUBCUTANEOUS | Status: DC
  Administered 2019-02-10 – 2019-02-14 (×20): 5 [IU] via SUBCUTANEOUS

## 2019-02-10 MED ORDER — CHLORHEXIDINE GLUCONATE CLOTH 2 % EX PADS
6.0000 | MEDICATED_PAD | Freq: Every day | CUTANEOUS | Status: DC
  Administered 2019-02-11 – 2019-02-19 (×8): 6 via TOPICAL

## 2019-02-10 NOTE — Progress Notes (Signed)
SLP Cancellation Note  Patient Details Name: Gabrielle Wright MRN: [id number] DOB: 1946/06/16   Cancelled treatment:       Reason Eval/Treat Not Completed: Medical issues which prohibited therapy   Gabrielle Wright 02/10/2019, 11:40 AM

## 2019-02-10 NOTE — Progress Notes (Signed)
Progress Note  Patient Name: Gabrielle Wright Date of Encounter: 02/10/2019  Primary Cardiologist: No primary care provider on file.   Subjective   The patient is intubated but awake.  She denies pain.  Inpatient Medications    Scheduled Meds: . chlorhexidine gluconate (MEDLINE KIT)  15 mL Mouth Rinse BID  . Chlorhexidine Gluconate Cloth  6 each Topical Q0600  . heparin  5,000 Units Subcutaneous Q8H  . insulin aspart  0-24 Units Subcutaneous Q4H  . insulin aspart  5 Units Subcutaneous Q4H  . insulin detemir  10 Units Subcutaneous Once  . insulin detemir  45 Units Subcutaneous BID  . lactulose  30 g Per Tube Once  . mouth rinse  15 mL Mouth Rinse 10 times per day  . pantoprazole (PROTONIX) IV  40 mg Intravenous QHS  . polyethylene glycol  17 g Oral Daily  . sodium chloride flush  10-40 mL Intracatheter Q12H  . sodium phosphate  1 enema Rectal Once   Continuous Infusions: . sodium chloride Stopped (02/10/19 0000)  . dexmedetomidine (PRECEDEX) IV infusion 1 mcg/kg/hr (02/10/19 1246)  . feeding supplement (VITAL AF 1.2 CAL) 1,000 mL (02/10/19 0906)  . norepinephrine (LEVOPHED) Adult infusion Stopped (02/10/19 0533)  . penicillin g continuous IV infusion 41.7 mL/hr at 02/10/19 0800   PRN Meds: acetaminophen (TYLENOL) oral liquid 160 mg/5 mL, acetaminophen, dextrose, fentaNYL, fentaNYL (SUBLIMAZE) injection, ondansetron (ZOFRAN) IV, sodium chloride flush   Vital Signs    Vitals:   02/10/19 0900 02/10/19 0915 02/10/19 1120 02/10/19 1156  BP: (!) 93/58  (!) 93/50   Pulse: 70 75 75   Resp: 16 16 16    Temp:    (!) 100.9 F (38.3 C)  TempSrc:    Axillary  SpO2: 100% 99% 99%   Weight:      Height:        Intake/Output Summary (Last 24 hours) at 02/10/2019 1347 Last data filed at 02/10/2019 1246 Gross per 24 hour  Intake 4248.32 ml  Output 2150 ml  Net 2098.32 ml   Last 3 Weights 02/10/2019 02/09/2019 02/08/2019  Weight (lbs) 187 lb 6.3 oz 178 lb 12.7 oz 181 lb 3.5 oz   Weight (kg) 85 kg 81.1 kg 82.2 kg      Telemetry    Normal sinus rhythm- Personally Reviewed   Physical Exam  Awake and alert, intubated, on Precedex GEN: No acute distress.   Neck: No JVD Cardiac: RRR, no murmurs, rubs, or gallops.  Respiratory: Clear to auscultation bilaterally. GI: Soft, nontender, non-distended  MS:  Mild diffuse edema; No deformity. Neuro:  Nonfocal  Psych: Normal affect   Labs    Chemistry Recent Labs  Lab 02/03/19 2354 02/04/19 0904  02/08/19 0410 02/09/19 0426 02/09/19 0511 02/10/19 0437  NA 131* 131*   < > 139 133* 133* 132*  K 5.2* 7.1*   < > 4.4 4.8 4.5 4.7  CL 100 106   < > 106  --  99 99  CO2 22 18*   < > 24  --  23 23  GLUCOSE 223* 401*   < > 133*  --  472* 425*  BUN 33* 29*   < > 32*  --  56* 69*  CREATININE 1.62* 1.75*   < > 1.41*  --  1.67* 1.79*  CALCIUM 9.1 7.0*   < > 8.6*  --  8.1* 8.1*  PROT 7.6 5.8*  --   --   --   --   --  ALBUMIN 4.0 2.8*  --   --   --   --   --   AST 29 33  --   --   --   --   --   ALT 16 17  --   --   --   --   --   ALKPHOS 66 53  --   --   --   --   --   BILITOT 0.5 1.2  --   --   --   --   --   GFRNONAA 31* 29*   < > 37*  --  30* 28*  GFRAA 36* 33*   < > 43*  --  35* 32*  ANIONGAP 9 7   < > 9  --  11 10   < > = values in this interval not displayed.     Hematology Recent Labs  Lab 02/08/19 0410 02/09/19 0426 02/09/19 0511 02/10/19 0437  WBC 18.1*  --  13.6* 12.7*  RBC 3.59*  --  3.54* 3.24*  HGB 8.9* 8.8* 8.3* 7.9*  HCT 28.2* 26.0* 28.2* 26.3*  MCV 78.6*  --  79.7* 81.2  MCH 24.8*  --  23.4* 24.4*  MCHC 31.6  --  29.4* 30.0  RDW 18.0*  --  17.8* 18.1*  PLT 160  --  165 201    Cardiac Enzymes Recent Labs  Lab 02/09/19 1215 02/09/19 1734 02/09/19 2306  TROPONINI 9.14* 10.62* 9.58*   No results for input(s): TROPIPOC in the last 168 hours.   BNP Recent Labs  Lab 02/04/19 0904  BNP 599.1*     DDimer No results for input(s): DDIMER in the last 168 hours.   Radiology    Dg  Abd 1 View  Result Date: 02/10/2019 CLINICAL DATA:  Abdominal distention EXAM: ABDOMEN - 1 VIEW COMPARISON:  06/11/2018 abdominal CT FINDINGS: Orogastric tube tip overlaps the stomach. Colonic distension sparing the rectum, containing stool proximally and gas distally. No gas dilated small bowel dilatation. No obstructive process seen in the rectum on 2019 abdominal CT. IMPRESSION: Diffusely distended is colon, sparing the rectum. Clinical circumstances favors ileus over low obstruction. Retained stool is seen proximally and distally the colon is gas filled. Electronically Signed   By: Monte Fantasia M.D.   On: 02/10/2019 11:53   Dg Chest Port 1 View  Result Date: 02/09/2019 CLINICAL DATA:  ETT EXAM: PORTABLE CHEST 1 VIEW COMPARISON:  02/08/2019 FINDINGS: Multifocal patchy/interstitial opacities, right upper lobe predominant. Possible small bilateral pleural effusions. Overall appearance is mildly progressive. Cardiomegaly. Endotracheal tube terminates 2 cm above the carina. Left subclavian venous catheter terminates in the upper right atrium. Enteric tube courses into the mid stomach. IMPRESSION: Multifocal patchy/interstitial opacities, right upper lobe predominant, mildly progressive. This appearance favors asymmetric interstitial edema over multifocal infection. Cardiomegaly with possible small bilateral pleural effusions. Endotracheal tube terminates 2 cm above the carina. Additional support apparatus as above. Electronically Signed   By: Julian Hy M.D.   On: 02/09/2019 05:19    Cardiac Studies   Echo: IMPRESSIONS    1. The left ventricle has moderately reduced systolic function, with an ejection fraction of 35-40%. The cavity size was normal. Left ventricular diastolic Doppler parameters are consistent with pseudonormalization. Elevated left ventricular  end-diastolic pressure The E/e' is 34. Regional wall motion abnormalities (see coded diagram below).  2. The right ventricle has  mildly reduced systolic function. The cavity was normal. There is no increase in right ventricular wall thickness.  3. No hemodynamically significant valvular heart disease.  4. Normal biatrial chamber size.  5. IVC not well visualized.  FINDINGS  Left Ventricle: The left ventricle has moderately reduced systolic function, with an ejection fraction of 35-40%. The cavity size was normal. There is no increase in left ventricular wall thickness. Left ventricular diastolic Doppler parameters are  consistent with pseudonormalization. Elevated left ventricular end-diastolic pressure The E/e' is 78.  The entire apex, mid and apical anterior septum, and mid and apical inferior  septum are akinetic. The basal and mid anterior wall, mid inferolateral  segment, mid anterolateral segment, and mid inferior segment are hypokinetic.  All remaining scored segments are normal.   Right Ventricle: The right ventricle has mildly reduced systolic function. The cavity was normal. There is no increase in right ventricular wall thickness. Left Atrium: left atrial size was normal in size Right Atrium: right atrial size was normal in size. Interatrial Septum: No atrial level shunt detected by color flow Doppler. Pericardium: There is no evidence of pericardial effusion. Mitral Valve: The mitral valve is abnormal. There is mild mitral annular calcification present. Mitral valve regurgitation is trivial by color flow Doppler. Tricuspid Valve: The tricuspid valve is normal in structure. Tricuspid valve regurgitation is trivial by color flow Doppler. Aortic Valve: The aortic valve is grossly normal Aortic valve regurgitation is trivial by color flow Doppler. Indeterminate number of aortic valve cusps due to image quality. Pulmonic Valve: The pulmonic valve was not well visualized. Pulmonic valve regurgitation is trivial by color flow Doppler. Aorta: The aortic root is normal in size and structure. Venous: The inferior  vena cava was not well visualized. Patient is intubated on mechanical ventilator.   Cardiac Cath 02/09/2019: Conclusion     2nd Mrg-3 lesion is 80% stenosed.  Acute Mrg lesion is 90% stenosed.    Less than 25% left main  Proximal diffuse 30 to 40% narrowing within the previously placed LAD stent.  The entire apical LAD is diffusely diseased with up to 85% stenosis.  The first diagonal contains 85% ostial narrowing.  Circumflex contains diffuse proximal to mid eccentric 50% stenosis the dominant second obtuse marginal contains mid vessel 50% stenosis in the mid to distal portion of this obtuse marginal there is a patent stent with 20% narrowing, and just distal to the stent there is an 80% thrombotic stenosis.  The RCA is dominant containing 50% mid vessel stenosis, 70% distal stenosis, 70% ostial PDA stenosis, and total occlusion of the mid vessel that fills by collaterals from the left ventricular apex.  Left ventriculography is not performed.  Today's echo was reviewed and there is anteroapical severe hypokinesis and estimated ejection fraction of 40%.  LVEDP by Cath Lab hemodynamics is 40 mmHg.  RECOMMENDATIONS:   Acute on chronic combined systolic and diastolic heart failure with extremely elevated LVEDP greater than or equal to 40 mmHg related to global ischemia in the setting of norepinephrine therapy.  Recommend clopidogrel therapy if possible given stents in multiple territories and evidence of active thrombus beyond the stent in the obtuse marginal.  Current anatomy does not suggest that a role for coronary intervention.  35 cc of contrast used.  Given diabetes/shock state/intrinsic kidney disease, a bump in creatinine is still highly likely.  Kidney function needs to be monitored.     Patient Profile     73 y.o. female with non-STEMI in the setting of acute viral illness complicated by respiratory failure  Assessment & Plan    1.  Non-STEMI: Cardiac  catheterization data and images personally reviewed.  She has nonocclusive thrombus out and a distal obtuse marginal branch not amenable to PCI.  I agree that she should be treated medically with dual antiplatelet therapy.  2.  Acute respiratory failure: Per CCM team.  Complicated by acute systolic and diastolic heart failure.  LVEDP markedly elevated at cardiac catheterization.  Also complicated by acute kidney injury with creatinine trend upward.  Would hold IV diuretics today.  Will load with clopidogrel today.  Will follow with you.  For questions or updates, please contact [facility] Please consult [email or web address] for contact info under     Signed, Sherren Mocha, MD  02/10/2019, 1:47 PM

## 2019-02-10 NOTE — Progress Notes (Signed)
NAME:  Gabrielle Wright, MRN:  [id number], DOB:  1946-05-03, LOS: 6 ADMISSION DATE:  02/03/2019, CONSULTATION DATE: 02/04/2019 REFERRING MD: Emergency department physician cHIEF COMPLAINT: Fever respiratory failure  Brief History   73 year old elderly woman visiting from [place], admitted 3/10 with fevers, hypotension, found to have group A strep bacteremia, required mechanical ventilation   Past Medical History  Hypertension Coronary artery disease Suspected diabetes review of her arterial consult receiving correct.  Significant Hospital IsEvents   02/04/2019 transfer from [facility] to [facility] required intubation and pressors.  Consults:  02/04/2019 ID called>>  Procedures:  02/04/2019 intubation>> 02/04/2019 and central line placement>>  Significant Diagnostic Tests:  Echo 3/15 decreased EF 35 to 40%, LVEDP 27, no significant MR Cardiac cath >> LVEDP 40, RCA totally occluded, patent OM1 stent but distally occluded, apical LAD diffusely diseased, no intervention  Micro Data:  02/04/2019 blood cultures x2>> Gr A strep 02/04/2019 sputum culture>> neg 02/04/2019 urine culture>>neg 02/04/2019 respiratory virus panel>> rhinovirus 02/04/2019 flu a and B>> neg Possible coronavirus testing 02/04/2019>>neg   Antimicrobials:  02/04/2019 vancomycin>>3/ 10 02/04/2019 Zosyn>> 3/19 Pen G 3/10 >> 3/22 (plan)  Interim history/subjective:  Low grade fever. Remains critically ill, Levophed turned off early this morning, remains intubated Sedated with Precedex and fentanyl    Objective   Blood pressure (!) 93/58, pulse 75, temperature 100 F (37.8 C), temperature source Axillary, resp. rate 16, height 5\' 6"  (1.676 m), weight 85 kg, SpO2 99 %. CVP:  [14 mmHg] 14 mmHg  Vent Mode: PRVC FiO2 (%):  [40 %-50 %] 40 % Set Rate:  [16 bmp] 16 bmp Vt Set:  [550 mL] 550 mL PEEP:  [10 cmH20] 10 cmH20 Plateau Pressure:  [20 cmH20-30 cmH20] 30 cmH20   Intake/Output Summary (Last 24  hours) at 02/10/2019 1119 Last data filed at 02/10/2019 0900 Gross per 24 hour  Intake 4100.54 ml  Output 2125 ml  Net 1975.54 ml   Filed Weights   02/08/19 0408 02/09/19 0349 02/10/19 0351  Weight: 82.2 kg 81.1 kg 85 kg   Examination: Elderly woman, intubated, no distress No pallor, icterus Sedated on drips, RA SS -1, opens eyes when spoken to and follows one-step commands Daughter at bedside who can translate in [place] Decreased breath sounds bilateral Soft distended abdomen, nontender S1-S2 regular on monitor Puffy hands and 1+ edema feet  Chest x-ray 3/16 personally reviewed which shows ET tube in position and atypical bilateral infiltrates but suggestive edema  Resolved Hospital Problem list     Assessment & Plan:  Septic shock/group A strep bacteremia - Viral testing  negative for COVID, positive for rhino  -Off pressors, continue penicillinntil 3/22  Acute respiratory failure/acute pulmonary edema - drop PEEP to 5 -Start SBTs with goal extubation -Continue Precedex with RA SS goal 0 to -1    AKI  -due to sepsis/ARB and now diuretics Hyperkalemia - resolved -Hold Lasix for now  Ischemic cardiomyopathy/LVEDP on cath with no targets for revascularization - will need to restart Plavix -?  Need for hydralazine nitrates here if we cannot do ARB  Diabetes type 2/uncontrolled hyperglycemia -Was on high dose of 70/30 at home -Increase Levemir to 45 twice daily SSI resistant try to avoid insulin drip again  Abdominal distention -discontinue fentanyl drip and use intermittent fentanyl X-ray abdomen flat plate Lactulose x1 and if x-ray okay, may need enema Started on tube feeds   Daughter updated at bedside  The patient is critically ill with multiple organ systems failure  and requires high complexity decision making for assessment and support, frequent evaluation and titration of therapies, application of advanced monitoring technologies and extensive  interpretation of multiple databases. Critical Care Time devoted to patient care services described in this note independent of APP/resident  time is 35 minutes.    Cyril Mourning MD. Tonny Bollman. [facility] Pager [phone] If no response call [phone]   02/10/2019

## 2019-02-10 NOTE — Progress Notes (Addendum)
Inpatient Diabetes Program Recommendations  AACE/ADA: New Consensus Statement on Inpatient Glycemic Control (2015)  Target Ranges:  Prepandial:   less than 140 mg/dL      Peak postprandial:   less than 180 mg/dL (1-2 hours)      Critically ill patients:  140 - 180 mg/dL   Lab Results  Component Value Date   GLUCAP 367 (H) 02/10/2019   HGBA1C 10.1 (H) 02/04/2019    Review of Glycemic Control Results for MADYSEN, Gabrielle Wright (MRN [id number]) as of 02/10/2019 12:37  Ref. Range 02/09/2019 23:55 02/10/2019 04:03 02/10/2019 08:31 02/10/2019 11:52  Glucose-Capillary Latest Ref Range: 70 - 99 mg/dL 474 (H) 259 (H) 563 (H) 367 (H)   Admit Hypotension in the setting of fever/ Acute respiratory distress in the setting of fever 103.9/ Suspected viral infection   History: DM Current Orders: Novolog (0-24 units) Q4 hours, Levemir 45 units BID  Inpatient Diabetes Program Recommendations:    If plan is to attempt to avoid glucostabilizer, could consider adding tube feed coverage: Novolog 5 units Q4H (assuming that this would be stopped or held, in the event tube feeds are stopped).  Thanks, Lujean Rave, MSN, RNC-OB Diabetes Coordinator [phone] (8a-5p)

## 2019-02-10 NOTE — Progress Notes (Signed)
PT Cancellation Note  Patient Details Name: Gabrielle Wright MRN: [id number] DOB: 05-09-1946   Cancelled Treatment:    Reason Eval/Treat Not Completed: Medical issues which prohibited therapy Events of yesterday noted, dicussed with RN who requests PT hold until plan of care established today.   Etta Grandchild, PT, DPT Acute Rehabilitation Services Pager: [phone] Office: [phone]     Etta Grandchild 02/10/2019, 9:01 AM

## 2019-02-10 NOTE — Progress Notes (Signed)
Patient ID: Gabrielle Wright, female   DOB: 1946-11-08, 73 y.o.   MRN: [id number]         [facility]  Date of Admission:  02/03/2019    Total days of antibiotics 8        Day 7 penicillin         ASSESSMENT: She developed heart failure, EKG changes and elevated troponins yesterday.  She went to the Cath Lab and was found to have "no actionable coronary disease."  She remains afebrile.  Commend 14 days of total therapy for her recent group A strep bacteremia.  Repeat blood cultures have been negative.  PLAN: 1. Continue penicillin for 6 more days through 02/16/2019 2. 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 chlorhexidine gluconate (MEDLINE KIT)  15 mL Mouth Rinse BID  . Chlorhexidine Gluconate Cloth  6 each Topical Q0600  . heparin  5,000 Units Subcutaneous Q8H  . insulin aspart  0-24 Units Subcutaneous Q4H  . insulin detemir  35 Units Subcutaneous BID  . mouth rinse  15 mL Mouth Rinse 10 times per day  . pantoprazole (PROTONIX) IV  40 mg Intravenous QHS  . polyethylene glycol  17 g Oral Daily  . sodium chloride flush  10-40 mL Intracatheter Q12H   Continuous Infusions: . sodium chloride Stopped (02/10/19 0000)  . dexmedetomidine (PRECEDEX) IV infusion 0.6 mcg/kg/hr (02/10/19 0900)  . feeding supplement (VITAL AF 1.2 CAL) 1,000 mL (02/10/19 0906)  . fentaNYL infusion INTRAVENOUS 250 mcg/hr (02/10/19 0900)  . norepinephrine (LEVOPHED) Adult infusion Stopped (02/10/19 0533)  . penicillin g continuous IV infusion 41.7 mL/hr at 02/10/19 0800   PRN Meds:.acetaminophen (TYLENOL) oral liquid 160 mg/5 mL, acetaminophen, dextrose, fentaNYL, ondansetron (ZOFRAN) IV, sodium chloride flush   Review  of Systems: Review of Systems  Unable to perform ROS: Intubated    No Known Allergies  OBJECTIVE: Vitals:   02/10/19 0832 02/10/19 0845 02/10/19 0900 02/10/19 0915  BP:   (!) 93/58   Pulse:  71 70 75  Resp:  16 16 16   Temp: 100 F (37.8 C)     TempSrc: Axillary     SpO2:  100% 100% 99%  Weight:      Height:       Body mass index is 30.25 kg/m.  Physical Exam Constitutional:      Comments: She remains sedated and intubated.  Cardiovascular:     Rate and Rhythm: Normal rate and regular rhythm.     Heart sounds: No murmur.  Pulmonary:     Effort: Pulmonary effort is normal.     Breath sounds: Normal breath sounds.  Abdominal:     General: There is distension.     Palpations: Abdomen is soft.     Comments: Tympanitic  Skin:    Findings: No rash.     Comments: No cellulitis.     Lab Results Lab Results  Component Value Date   WBC 12.7 (H) 02/10/2019   HGB 7.9 (L) 02/10/2019   HCT 26.3 (L) 02/10/2019   MCV 81.2 02/10/2019   PLT 201 02/10/2019    Lab Results  Component Value Date   CREATININE 1.79 (H) 02/10/2019   BUN 69 (H) 02/10/2019  NA 132 (L) 02/10/2019   K 4.7 02/10/2019   CL 99 02/10/2019   CO2 23 02/10/2019    Lab Results  Component Value Date   ALT 17 02/04/2019   AST 33 02/04/2019   ALKPHOS 53 02/04/2019   BILITOT 1.2 02/04/2019     Microbiology: Recent Results (from the past 240 hour(s))  Culture, blood (Routine x 2)     Status: Abnormal   Collection Time: 02/03/19 11:54 PM  Result Value Ref Range Status   Specimen Description   Final    BLOOD RIGHT FOREARM Performed at [facility], [address] [place], [place] [address]    Special Requests   Final    BOTTLES DRAWN AEROBIC AND ANAEROBIC Blood Culture adequate volume Performed at [facility], [address] [place], [place] [address]    Culture  Setup Time   Final    GRAM POSITIVE COCCI IN BOTH AEROBIC AND ANAEROBIC BOTTLES CRITICAL RESULT CALLED TO,  READ BACK BY AND VERIFIED WITH: A MASTERS PHARMD 2204 02/04/19 A BROWNING    Culture (A)  Final    GROUP A STREP (S.PYOGENES) ISOLATED HEALTH DEPARTMENT NOTIFIED Performed at [facility], [place] [address] [place], [place] [address]    Report Status 02/06/2019 FINAL  Final   Organism ID, Bacteria GROUP A STREP (S.PYOGENES) ISOLATED  Final      Susceptibility   Group a strep (s.pyogenes) isolated - MIC*    PENICILLIN <=0.06 SENSITIVE Sensitive     CEFTRIAXONE <=0.12 SENSITIVE Sensitive     ERYTHROMYCIN <=0.12 SENSITIVE Sensitive     LEVOFLOXACIN 4 INTERMEDIATE Intermediate     VANCOMYCIN 0.5 SENSITIVE Sensitive     * GROUP A STREP (S.PYOGENES) ISOLATED  Blood Culture ID Panel (Reflexed)     Status: Abnormal   Collection Time: 02/03/19 11:54 PM  Result Value Ref Range Status   Enterococcus species NOT DETECTED NOT DETECTED Final   Listeria monocytogenes NOT DETECTED NOT DETECTED Final   Staphylococcus species NOT DETECTED NOT DETECTED Final   Staphylococcus aureus (BCID) NOT DETECTED NOT DETECTED Final   Streptococcus species DETECTED (A) NOT DETECTED Final    Comment: CRITICAL RESULT CALLED TO, READ BACK BY AND VERIFIED WITH: A MASTERS PHARMD 2204 02/04/19 A BROWNING    Streptococcus agalactiae NOT DETECTED NOT DETECTED Final   Streptococcus pneumoniae NOT DETECTED NOT DETECTED Final   Streptococcus pyogenes DETECTED (A) NOT DETECTED Final    Comment: CRITICAL RESULT CALLED TO, READ BACK BY AND VERIFIED WITH: A MASTERS PHARMD 2204 02/04/19 A BROWNING    Acinetobacter baumannii NOT DETECTED NOT DETECTED Final   Enterobacteriaceae species NOT DETECTED NOT DETECTED Final   Enterobacter cloacae complex NOT DETECTED NOT DETECTED Final   Escherichia coli NOT DETECTED NOT DETECTED Final   Klebsiella oxytoca NOT DETECTED NOT DETECTED Final   Klebsiella pneumoniae NOT DETECTED NOT DETECTED Final   Proteus species NOT DETECTED NOT DETECTED Final   Serratia marcescens NOT DETECTED NOT  DETECTED Final   Haemophilus influenzae NOT DETECTED NOT DETECTED Final   Neisseria meningitidis NOT DETECTED NOT DETECTED Final   Pseudomonas aeruginosa NOT DETECTED NOT DETECTED Final   Candida albicans NOT DETECTED NOT DETECTED Final   Candida glabrata NOT DETECTED NOT DETECTED Final   Candida krusei NOT DETECTED NOT DETECTED Final   Candida parapsilosis NOT DETECTED NOT DETECTED Final   Candida tropicalis NOT DETECTED NOT DETECTED Final    Comment: Performed at [facility],  [address] [place], [place] [address]  Culture, blood (Routine x 2)     Status: Abnormal   Collection Time: 02/04/19 12:12 AM  Result Value Ref Range Status   Specimen Description   Final    BLOOD RIGHT WRIST Performed at [facility], [address] [place], [place] [address]    Special Requests   Final    BOTTLES DRAWN AEROBIC AND ANAEROBIC Blood Culture adequate volume Performed at [facility], [address] [place], [place] [address]    Culture  Setup Time   Final    GRAM POSITIVE COCCI IN BOTH AEROBIC AND ANAEROBIC BOTTLES CRITICAL RESULT CALLED TO, READ BACK BY AND VERIFIED WITH: A MASTERS PHARMD 2204 02/04/19 A BROWNING    Culture (A)  Final    GROUP A STREP (S.PYOGENES) ISOLATED SUSCEPTIBILITIES PERFORMED ON PREVIOUS CULTURE WITHIN THE LAST 5 DAYS. HEALTH DEPARTMENT NOTIFIED Performed at [facility], [place] [address], [place], [place] [address]    Report Status 02/06/2019 FINAL  Final  Urine culture     Status: None   Collection Time: 02/04/19  1:23 AM  Result Value Ref Range Status   Specimen Description   Final    URINE, CATHETERIZED Performed at [facility], [address] [place], [place] [address]    Special Requests   Final    NONE Performed at [facility], [address] [place], [place] [address]    Culture   Final    NO GROWTH Performed at [facility], [place] [address], [place], [place] [address]    Report  Status 02/05/2019 FINAL  Final  Respiratory Panel by PCR     Status: Abnormal   Collection Time: 02/04/19  6:36 AM  Result Value Ref Range Status   Adenovirus NOT DETECTED NOT DETECTED Final   Coronavirus 229E NOT DETECTED NOT DETECTED Final    Comment: (NOTE) The Coronavirus on the Respiratory Panel, DOES NOT test for the novel  Coronavirus (2019 nCoV)    Coronavirus HKU1 NOT DETECTED NOT DETECTED Final   Coronavirus NL63 NOT DETECTED NOT DETECTED Final   Coronavirus OC43 NOT DETECTED NOT DETECTED Final   Metapneumovirus NOT DETECTED NOT DETECTED Final   Rhinovirus / Enterovirus DETECTED (A) NOT DETECTED Final   Influenza A NOT DETECTED NOT DETECTED Final   Influenza B NOT DETECTED NOT DETECTED Final   Parainfluenza Virus 1 NOT DETECTED NOT DETECTED Final   Parainfluenza Virus 2 NOT DETECTED NOT DETECTED Final   Parainfluenza Virus 3 NOT DETECTED NOT DETECTED Final   Parainfluenza Virus 4 NOT DETECTED NOT DETECTED Final   Respiratory Syncytial Virus NOT DETECTED NOT DETECTED Final   Bordetella pertussis NOT DETECTED NOT DETECTED Final   Chlamydophila pneumoniae NOT DETECTED NOT DETECTED Final   Mycoplasma pneumoniae NOT DETECTED NOT DETECTED Final    Comment: Performed at [facility], [place]. [address] [place], [place] [address]  MRSA PCR Screening     Status: None   Collection Time: 02/04/19  6:36 AM  Result Value Ref Range Status   MRSA by PCR NEGATIVE NEGATIVE Final    Comment:        The GeneXpert MRSA Assay (FDA approved for NASAL specimens only), is one component of a comprehensive MRSA colonization surveillance program. It is not intended to diagnose MRSA infection nor to guide or monitor treatment for MRSA infections. Performed at [facility], [address] [place], [place] [address]   Culture, blood (routine x 2)     Status: None   Collection Time: 02/04/19  8:50 AM  Result Value Ref Range Status   Specimen Description BLOOD CENTRAL LINE  Final    Special Requests   Final    BOTTLES DRAWN AEROBIC ONLY Blood Culture adequate volume   Culture   Final    NO GROWTH 5 DAYS Performed at [facility], [address], [place], [place] [address]    Report Status 02/09/2019 FINAL  Final  Culture, blood (routine x 2)     Status: None   Collection Time: 02/04/19  8:55 AM  Result Value Ref Range Status   Specimen Description BLOOD CENTRAL LINE  Final   Special Requests   Final    BOTTLES DRAWN AEROBIC ONLY Blood Culture adequate volume   Culture   Final    NO GROWTH 5 DAYS Performed at [facility], [place] [address], [place], [place] [address]    Report Status 02/09/2019 FINAL  Final  Culture, respiratory (non-expectorated)     Status: None   Collection Time: 02/04/19  9:22 AM  Result Value Ref Range Status   Specimen Description TRACHEAL ASPIRATE  Final   Special Requests Normal  Final   Gram Stain   Final    MODERATE WBC PRESENT, PREDOMINANTLY PMN RARE SQUAMOUS EPITHELIAL CELLS PRESENT NO ORGANISMS SEEN    Culture   Final    RARE Consistent with normal respiratory flora. Performed at [facility], [place] [address], [place], [place] [address]    Report Status 02/06/2019 FINAL  Final  Urine culture     Status: None   Collection Time: 02/04/19  3:40 PM  Result Value Ref Range Status   Specimen Description URINE, RANDOM  Final   Special Requests ADDED ON 02/04/19 AT 2146  Final   Culture   Final    NO GROWTH Performed at [facility], [place] [address], [place], [place] [address]    Report Status 02/06/2019 FINAL  Final    Michel Bickers, MD [place] [facility] [place] [facility] [phone] pager   [phone] cell 02/10/2019, 9:50 AM

## 2019-02-11 ENCOUNTER — Inpatient Hospital Stay (HOSPITAL_COMMUNITY): Payer: Medicaid Other

## 2019-02-11 DIAGNOSIS — N179 Acute kidney failure, unspecified: Secondary | ICD-10-CM

## 2019-02-11 LAB — BASIC METABOLIC PANEL
ANION GAP: 10 (ref 5–15)
BUN: 85 mg/dL — ABNORMAL HIGH (ref 8–23)
CO2: 22 mmol/L (ref 22–32)
Calcium: 8.2 mg/dL — ABNORMAL LOW (ref 8.9–10.3)
Chloride: 103 mmol/L (ref 98–111)
Creatinine, Ser: 1.86 mg/dL — ABNORMAL HIGH (ref 0.44–1.00)
GFR calc Af Amer: 31 mL/min — ABNORMAL LOW (ref >60)
GFR calc non Af Amer: 27 mL/min — ABNORMAL LOW (ref >60)
Glucose, Bld: 302 mg/dL — ABNORMAL HIGH (ref 70–99)
Potassium: 4.7 mmol/L (ref 3.5–5.1)
Sodium: 135 mmol/L (ref 135–145)

## 2019-02-11 LAB — GLUCOSE, CAPILLARY
Glucose-Capillary: 271 mg/dL — ABNORMAL HIGH (ref 70–99)
Glucose-Capillary: 324 mg/dL — ABNORMAL HIGH (ref 70–99)
Glucose-Capillary: 324 mg/dL — ABNORMAL HIGH (ref 70–99)
Glucose-Capillary: 327 mg/dL — ABNORMAL HIGH (ref 70–99)
Glucose-Capillary: 328 mg/dL — ABNORMAL HIGH (ref 70–99)
Glucose-Capillary: 331 mg/dL — ABNORMAL HIGH (ref 70–99)

## 2019-02-11 LAB — CBC
HCT: 26.3 % — ABNORMAL LOW (ref 36.0–46.0)
Hemoglobin: 7.8 g/dL — ABNORMAL LOW (ref 12.0–15.0)
MCH: 23.8 pg — ABNORMAL LOW (ref 26.0–34.0)
MCHC: 29.7 g/dL — ABNORMAL LOW (ref 30.0–36.0)
MCV: 80.2 fL (ref 80.0–100.0)
Platelets: 218 10*3/uL (ref 150–400)
RBC: 3.28 MIL/uL — AB (ref 3.87–5.11)
RDW: 18.2 % — ABNORMAL HIGH (ref 11.5–15.5)
WBC: 17.3 10*3/uL — ABNORMAL HIGH (ref 4.0–10.5)
nRBC: 0.5 % — ABNORMAL HIGH (ref 0.0–0.2)

## 2019-02-11 LAB — PHOSPHORUS: Phosphorus: 4.7 mg/dL — ABNORMAL HIGH (ref 2.5–4.6)

## 2019-02-11 LAB — MAGNESIUM: Magnesium: 2.9 mg/dL — ABNORMAL HIGH (ref 1.7–2.4)

## 2019-02-11 LAB — NOVEL CORONAVIRUS 2019

## 2019-02-11 MED ORDER — INSULIN DETEMIR 100 UNIT/ML ~~LOC~~ SOLN
55.0000 [IU] | Freq: Two times a day (BID) | SUBCUTANEOUS | Status: DC
  Administered 2019-02-11: 55 [IU] via SUBCUTANEOUS
  Filled 2019-02-11 (×2): qty 0.55

## 2019-02-11 MED ORDER — METOCLOPRAMIDE HCL 5 MG/ML IJ SOLN
5.0000 mg | Freq: Three times a day (TID) | INTRAMUSCULAR | Status: AC
  Administered 2019-02-11 – 2019-02-13 (×6): 5 mg via INTRAVENOUS
  Filled 2019-02-11 (×6): qty 1

## 2019-02-11 MED ORDER — LACTULOSE 10 GM/15ML PO SOLN
30.0000 g | Freq: Once | ORAL | Status: AC
  Administered 2019-02-11: 30 g
  Filled 2019-02-11 (×2): qty 45

## 2019-02-11 MED ORDER — FUROSEMIDE 10 MG/ML IJ SOLN
60.0000 mg | Freq: Two times a day (BID) | INTRAMUSCULAR | Status: AC
  Administered 2019-02-11 (×2): 60 mg via INTRAVENOUS
  Filled 2019-02-11 (×2): qty 6

## 2019-02-11 NOTE — Progress Notes (Signed)
SLP Cancellation Note  Patient Details Name: Riley Leakey MRN: [id number] DOB: 08-04-1946   Cancelled treatment:       Reason Eval/Treat Not Completed: Medical issues which prohibited therapy; pt remains intubated; will continue to monitor for PO readiness    Wayden Schwertner E Tyarra Nolton MA, CCC-SLP  Acute Rehab Speech Language Pathologist  02/11/2019, 8:43 AM

## 2019-02-11 NOTE — Plan of Care (Signed)

## 2019-02-11 NOTE — Progress Notes (Signed)
NAME:  Gabrielle Wright, MRN:  [id number], DOB:  1946/07/10, LOS: 7 ADMISSION DATE:  02/03/2019, CONSULTATION DATE: 02/04/2019 REFERRING MD: Emergency department physician cHIEF COMPLAINT: Fever respiratory failure  Brief History   73 year old elderly woman visiting from [place], admitted 3/10 with fevers, hypotension, found to have group A strep bacteremia, required mechanical ventilation Course complicated by non-STEMI and new LV dysfunction   Past Medical History  Hypertension Coronary artery disease Suspected diabetes review of her arterial consult receiving correct.  Significant Hospital IsEvents   02/04/2019 transfer from [facility] to [facility] required intubation and pressors. 3/16 off Levophed  Consults:  02/04/2019 ID   Procedures:  02/04/2019 intubation>> 02/04/2019 CVL>>  Significant Diagnostic Tests:  Echo 3/15 decreased EF 35 to 40%, LVEDP 27, no significant MR Cardiac cath >> LVEDP 40, RCA totally occluded, patent OM1 stent but distally occluded, apical LAD diffusely diseased, no intervention  Micro Data:  02/04/2019 blood cultures x2>> Gr A strep 02/04/2019 sputum culture>> neg 02/04/2019 urine culture>>neg 02/04/2019 respiratory virus panel>> rhinovirus 02/04/2019 flu a and B>> neg Possible coronavirus testing 02/04/2019>>neg   Antimicrobials:  02/04/2019 vancomycin>>3/ 10 02/04/2019 Zosyn>> 3/19 Pen G 3/10 >> 3/22 (plan)  Interim history/subjective:   Is critically ill, intubated Off Levophed Sedated on Precedex, calm with intermittent agitation   Objective   Blood pressure (!) 98/58, pulse 68, temperature 97.6 F (36.4 C), temperature source Axillary, resp. rate 19, height 5\' 6"  (1.676 m), weight 87.6 kg, SpO2 100 %.    Vent Mode: PRVC FiO2 (%):  [30 %-40 %] 30 % Set Rate:  [16 bmp] 16 bmp Vt Set:  [550 mL-580 mL] 580 mL PEEP:  [5 cmH20] 5 cmH20 Plateau Pressure:  [25 cmH20-31 cmH20] 30 cmH20   Intake/Output Summary (Last 24 hours)  at 02/11/2019 1013 Last data filed at 02/11/2019 2671 Gross per 24 hour  Intake 1906.83 ml  Output 1390 ml  Net 516.83 ml   Filed Weights   02/09/19 0349 02/10/19 0351 02/11/19 0321  Weight: 81.1 kg 85 kg 87.6 kg   Examination: Elderly woman, intubated, no distress No pallor, icterus On Precedex, RA SS 0,  follows one-step commands with translator,  Decreased breath sounds bilateral , low tidal volumes on pressure support 15/5 Soft distended abdomen, nontender S1-S2 regular on monitor Puffy hands and 1+ edema feet  Chest x-ray 3/17 personally reviewed which shows increase edema pattern bilateral  Resolved Hospital Problem list     Assessment & Plan:  Septic shock/group A strep bacteremia - Viral testing  negative for COVID, positive for rhino  -Off pressors, continue penicillin until 3/22  Acute respiratory failure/acute pulmonary edema - SBTs with goal extubation -Continue Precedex with RA SS goal 0  -Needs more diuresis  AKI  -due to sepsis/ARB and now diuretics Hyperkalemia - resolved -Await creatinine and will re-dose Lasix accordingly  Ischemic cardiomyopathy/LVEDP on cath with no targets for revascularization -  ASA + Plavix -?  Need for hydralazine nitrates since we cannot do ARB  Diabetes type 2/uncontrolled hyperglycemia -Was on high dose of 70/30 at home -Increase Levemir to 55 twice daily SSI resistant , added tube feed coverage -Use insulin drip only if CBG more than 400  Abdominal distention -minimize intermittent fentanyl Rpt Lactulose x1 and if no bowel movement, give enema Reglan 5 q 86 doses Ct  tube feeds   Daughter updated at bedside 3/16  The patient is critically ill with multiple organ systems failure and requires high complexity decision making for  assessment and support, frequent evaluation and titration of therapies, application of advanced monitoring technologies and extensive interpretation of multiple databases. Critical Care Time  devoted to patient care services described in this note independent of APP/resident  time is 35 minutes.    Cyril Mourning MD. Tonny Bollman. [facility] Pager [phone] If no response call [phone]   02/11/2019

## 2019-02-11 NOTE — Evaluation (Signed)
Physical Therapy Evaluation Patient Details Name: Gabrielle Wright MRN: [id number] DOB: [birth year] Today's Date: 02/11/2019   History of Present Illness  73 y.o. female admitted on 02/03/19 for fever and cough (to [facility]) and was transferred to [facility] on 02/04/19 for hypotension and respiratory distress (acute respiratory failure/pulmonary edema) requiring intubation 3/10-current (time of PT eval 02/11/19).  She was visiting family from [place] and COVID 19 tests were negative.  She was found to have group A strep bacteremia (septic shiock), rhinovirus,  and course complicated by NSTEMI and new LV dysfunction s/p cardiac cath on 02/09/19.  Cardiology following.  Other dx include ischemic cardiomyopathy, hyperkalemia, DM2- uncontrolled, and abdominal distention.  Pt with significant PMH of DM, HTN, anc CAD.  Clinical Impression  Pt was on PS/CPAP FIO2 30% PEEP 5 on the vent this PM and was able to sit up with a teleinterpreter present.  She seemed to understand what I was asking of her via the interpreter and followed commands for all 4 extremities.  She coughed a little in sitting on the ventilator and her PAW  high alarm was present throughout.  She was not in any distress and VSS were stable.  She did fatigue quickly ~8 mins EOB and returned to supine.  She will hopefully progress well enough to go home with her family, but still may need to be considered for post acute rehab if she can qualify.   PT to follow acutely for deficits listed below.      Follow Up Recommendations Supervision for mobility/OOB(post acute rehab)    Equipment Recommendations  Rolling walker with 5" wheels;Wheelchair (measurements PT);Wheelchair cushion (measurements PT);3in1 (PT)    Recommendations for Other Services   NA    Precautions / Restrictions Precautions Precautions: Other (comment) Precaution Comments: ventilated, feeding tube (with ETT), restraints (wrist) Restrictions Weight Bearing Restrictions: No       Mobility  Bed Mobility Overal bed mobility: Needs Assistance Bed Mobility: Supine to Sit;Sit to Supine     Supine to sit: Mod assist;HOB elevated Sit to supine: Mod assist;HOB elevated   General bed mobility comments: Mod assist to come to sitting EOB with HOB maximally elevated.  She assisted in moving her legs over to the side of the bed (Weakly) and tried to grasp bed rail with her right hand during transitions.           Balance Overall balance assessment: Needs assistance Sitting-balance support: Bilateral upper extremity supported;Feet supported Sitting balance-Leahy Scale: Poor Sitting balance - Comments: need mod assist to support trunk EOB.   Postural control: Posterior lean                                   Pertinent Vitals/Pain Pain Assessment: No/denies pain    Home Living                   Additional Comments: unsure, pt is unable to report and family is not currently present.     Prior Function Level of Independence: Independent         Comments: presumed as she is visiting here from [place]     Hand Dominance        Extremity/Trunk Assessment   Upper Extremity Assessment Upper Extremity Assessment: Defer to OT evaluation    Lower Extremity Assessment Lower Extremity Assessment: Generalized weakness(pt moving all 4 extremities weakly to command)    Cervical / Trunk  Assessment Cervical / Trunk Assessment: Normal  Communication   Communication: Prefers language other than [place];Other (comment)(Bengali)  Cognition Arousal/Alertness: Awake/alert Behavior During Therapy: WFL for tasks assessed/performed Overall Cognitive Status: Difficult to assess                                 General Comments: Pt seemed to understand the interpreter. She had ETT, so could only nod.       General Comments General comments (skin integrity, edema, etc.): VSS on PS/CPAP mode on the vent with FiO2 30% and PEEP  5.          Assessment/Plan    PT Assessment Patient needs continued PT services  PT Problem List Decreased strength;Decreased activity tolerance;Decreased balance;Decreased mobility;Decreased knowledge of use of DME;Cardiopulmonary status limiting activity       PT Treatment Interventions DME instruction;Gait training;Stair training;Functional mobility training;Therapeutic activities;Therapeutic exercise;Balance training;Patient/family education    PT Goals (Current goals can be found in the Care Plan section)  Acute Rehab PT Goals Patient Stated Goal: unable to state ETT PT Goal Formulation: Patient unable to participate in goal setting Time For Goal Achievement: 02/25/19 Potential to Achieve Goals: Good    Frequency Min 3X/week           AM-PAC PT "6 Clicks" Mobility  Outcome Measure Help needed turning from your back to your side while in a flat bed without using bedrails?: A Lot Help needed moving from lying on your back to sitting on the side of a flat bed without using bedrails?: A Lot Help needed moving to and from a bed to a chair (including a wheelchair)?: Total Help needed standing up from a chair using your arms (e.g., wheelchair or bedside chair)?: Total Help needed to walk in hospital room?: Total Help needed climbing 3-5 steps with a railing? : Total 6 Click Score: 8    End of Session Equipment Utilized During Treatment: Oxygen Activity Tolerance: Patient limited by fatigue Patient left: in bed;with call bell/phone within reach;with restraints reapplied;with nursing/sitter in room Nurse Communication: Mobility status PT Visit Diagnosis: Muscle weakness (generalized) (M62.81);Difficulty in walking, not elsewhere classified (R26.2)    Time: 7482-7078 PT Time Calculation (min) (ACUTE ONLY): 21 min   Charges:        Lurena Joiner B. Bakari Nikolai, PT, DPT  Acute Rehabilitation [phone] pager [phone]) [phone] office   PT Evaluation $PT Eval High  Complexity: 1 High         02/11/2019, 6:17 PM

## 2019-02-11 NOTE — Progress Notes (Signed)
E-link made aware of increasing amounts of bloody secretions from ETT, will continue to monitor.

## 2019-02-11 NOTE — Progress Notes (Signed)
Inpatient Diabetes Program Recommendations  AACE/ADA: New Consensus Statement on Inpatient Glycemic Control (2015)  Target Ranges:  Prepandial:   less than 140 mg/dL      Peak postprandial:   less than 180 mg/dL (1-2 hours)      Critically ill patients:  140 - 180 mg/dL   Lab Results  Component Value Date   GLUCAP 271 (H) 02/11/2019   HGBA1C 10.1 (H) 02/04/2019    Review of Glycemic Control Results for Gabrielle Wright, Gabrielle Wright (MRN [id number]) as of 02/11/2019 08:39  Ref. Range 02/10/2019 23:40 02/11/2019 04:00 02/11/2019 07:44  Glucose-Capillary Latest Ref Range: 70 - 99 mg/dL 366 (H) 294 (H) 765 (H)   Admit Hypotension in the setting of fever/ Acute respiratory distress in the setting of fever 103.9/ Suspected viral infection  History: DM Current Orders:Novolog (0-24 units)Q4 hours, Levemir 45 units BID, Novolog 5 units Q4H  Inpatient Diabetes Program Recommendations:    If plan is to attempt to avoid glucostabilizer, consider the following: -Increase Levemir to 60 units BID - Increase Novolog 8 units Q4H (to be held in the event tube feeds are stopped/discontinued). (Of note, prior to discontinuation of glucostabilizer TDD in previous 24 hour period was 345.6 units). Thanks, Lujean Rave, MSN, RNC-OB Diabetes Coordinator [phone] (8a-5p)

## 2019-02-11 NOTE — Progress Notes (Signed)
Progress Note  Patient Name: Gabrielle Wright Date of Encounter: 02/11/2019  Primary Cardiologist:   No primary care provider on file.   Subjective   She denies chest pain.  She is intubated but answers questions  Inpatient Medications    Scheduled Meds: . aspirin EC  81 mg Oral Daily  . chlorhexidine gluconate (MEDLINE KIT)  15 mL Mouth Rinse BID  . Chlorhexidine Gluconate Cloth  6 each Topical Q0600  . clopidogrel  75 mg Oral Daily  . heparin  5,000 Units Subcutaneous Q8H  . insulin aspart  0-24 Units Subcutaneous Q4H  . insulin aspart  5 Units Subcutaneous Q4H  . insulin detemir  55 Units Subcutaneous BID  . lactulose  30 g Per Tube Once  . mouth rinse  15 mL Mouth Rinse 10 times per day  . metoCLOPramide (REGLAN) injection  5 mg Intravenous Q8H  . pantoprazole (PROTONIX) IV  40 mg Intravenous QHS  . polyethylene glycol  17 g Oral Daily  . sodium chloride flush  10-40 mL Intracatheter Q12H   Continuous Infusions: . sodium chloride Stopped (02/10/19 0000)  . dexmedetomidine (PRECEDEX) IV infusion 1.2 mcg/kg/hr (02/11/19 0271)  . feeding supplement (VITAL AF 1.2 CAL) 1,000 mL (02/11/19 0647)  . norepinephrine (LEVOPHED) Adult infusion Stopped (02/10/19 0533)  . penicillin g continuous IV infusion 8 Million Units (02/11/19 0320)   PRN Meds: acetaminophen (TYLENOL) oral liquid 160 mg/5 mL, acetaminophen, dextrose, fentaNYL, fentaNYL (SUBLIMAZE) injection, ondansetron (ZOFRAN) IV, sodium chloride flush   Vital Signs    Vitals:   02/11/19 0700 02/11/19 0747 02/11/19 0755 02/11/19 0800  BP: (!) 102/54  (!) 98/58 (!) 98/58  Pulse: 69  64 68  Resp: (!) _0 Temp:  97.6 F (36.4 C)    TempSrc:  Axillary    SpO2: 99%  100% 100%  Weight:      Height:        Intake/Output Summary (Last 24 hours) at 02/11/2019 1048 Last data filed at 02/11/2019 4232 Gross per 24 hour  Intake 1906.83 ml  Output 1390 ml  Net 516.83 ml   Filed Weights   02/09/19 0349 02/10/19 0351  02/11/19 0321  Weight: 81.1 kg 85 kg 87.6 kg    Telemetry    NSR - Personally Reviewed  ECG    NA - Personally Reviewed  Physical Exam   GEN:    Intubated sedated Neck: No  JVD Cardiac: RRR, no murmurs, rubs, or gallops.  Respiratory:    Decreased breath sounds. GI: Soft, nontender, non-distended  MS: No edema; No deformity. Neuro:  Nonfocal  Psych: Normal affect   Labs    Chemistry Recent Labs  Lab 02/09/19 0511 02/10/19 0437 02/11/19 0941  NA 133* 132* 135  K 4.5 4.7 4.7  CL 99 99 103  CO2 _1 GLUCOSE 472* 425* 302*  BUN 56* 69* 85*  CREATININE 1.67* 1.79* 1.86*  CALCIUM 8.1* 8.1* 8.2*  GFRNONAA 30* 28* 27*  GFRAA 35* 32* 31*  ANIONGAP _2 Hematology Recent Labs  Lab 02/09/19 0511 02/10/19 0437 02/11/19 0941  WBC 13.6* 12.7* 17.3*  RBC 3.54* 3.24* 3.28*  HGB 8.3* 7.9* 7.8*  HCT 28.2* 26.3* 26.3*  MCV 79.7* 81.2 80.2  MCH 23.4* 24.4* 23.8*  MCHC 29.4* 30.0 29.7*  RDW 17.8* 18.1* 18.2*  PLT 165 201 218    Cardiac Enzymes Recent Labs  Lab 02/09/19 1215 02/09/19 1734 02/09/19 2306  TROPONINI  9.14* 10.62* 9.58*   No results for input(s): TROPIPOC in the last 168 hours.   BNPNo results for input(s): BNP, PROBNP in the last 168 hours.   DDimer No results for input(s): DDIMER in the last 168 hours.   Radiology    Dg Abd 1 View  Result Date: 02/10/2019 CLINICAL DATA:  Abdominal distention EXAM: ABDOMEN - 1 VIEW COMPARISON:  06/11/2018 abdominal CT FINDINGS: Orogastric tube tip overlaps the stomach. Colonic distension sparing the rectum, containing stool proximally and gas distally. No gas dilated small bowel dilatation. No obstructive process seen in the rectum on 2019 abdominal CT. IMPRESSION: Diffusely distended is colon, sparing the rectum. Clinical circumstances favors ileus over low obstruction. Retained stool is seen proximally and distally the colon is gas filled. Electronically Signed   By: Monte Fantasia M.D.   On:  02/10/2019 11:53   Dg Chest Port 1 View  Result Date: 02/11/2019 CLINICAL DATA:  Hypoxia EXAM: PORTABLE CHEST 1 VIEW COMPARISON:  February 09, 2019 FINDINGS: Endotracheal tube tip is 3.4 cm above the carina. Central catheter tip is at the cavoatrial junction. Nasogastric tube tip and side port are below the diaphragm. No pneumothorax. There are pleural effusions bilaterally with patchy airspace consolidation in each lung base. There is mild interstitial edema. There is cardiomegaly with pulmonary venous hypertension. There is aortic atherosclerosis. No adenopathy. No bone lesions. IMPRESSION: Tube and catheter positions as described without pneumothorax. There is pulmonary vascular congestion with interstitial edema and pleural effusions bilaterally. Suspect a degree of underlying congestive heart failure. Patchy consolidation in the lung bases may represent alveolar edema. A degree of superimposed pneumonia cannot be excluded radiographically. Electronically Signed   By: Lowella Grip III M.D.   On: 02/11/2019 07:37    Cardiac Studies   Echo: IMPRESSIONS   1. The left ventricle has moderately reduced systolic function, with an ejection fraction of 35-40%. The cavity size was normal. Left ventricular diastolic Doppler parameters are consistent with pseudonormalization. Elevated left ventricular  end-diastolic pressure The E/e' is 64. Regional wall motion abnormalities (see coded diagram below). 2. The right ventricle has mildly reduced systolic function. The cavity was normal. There is no increase in right ventricular wall thickness. 3. No hemodynamically significant valvular heart disease. 4. Normal biatrial chamber size. 5. IVC not well visualized.  FINDINGS Left Ventricle: The left ventricle has moderately reduced systolic function, with an ejection fraction of 35-40%. The cavity size was normal. There is no increase in left ventricular wall thickness. Left ventricular diastolic  Doppler parameters are  consistent with pseudonormalization. Elevated left ventricular end-diastolic pressure The E/e' is 55. The entire apex, mid and apical anterior septum, and mid and apical inferior septum are akinetic. The basal and mid anterior wall, mid inferolateral segment, mid anterolateral segment, and mid inferior segment are hypokinetic. All remaining scored segments are normal.   Cardiac Cath 02/09/2019:     2nd Mrg-3 lesion is 80% stenosed.  Acute Mrg lesion is 90% stenosed.   Less than 25% left main  Proximal diffuse 30 to 40% narrowing within the previously placed LAD stent. The entire apical LAD is diffusely diseased with up to 85% stenosis. The first diagonal contains 85% ostial narrowing.  Circumflex contains diffuse proximal to mid eccentric 50% stenosis the dominant second obtuse marginal contains mid vessel 50% stenosis in the mid to distal portion of this obtuse marginal there is a patent stent with 20% narrowing, and just distal to the stent there is an 80% thrombotic stenosis.  The RCA is dominant containing 50% mid vessel stenosis, 70% distal stenosis, 70% ostial PDA stenosis, and total occlusion of the mid vessel that fills by collaterals from the left ventricular apex.  Left ventriculography is not performed. Today's echo was reviewed and there is anteroapical severe hypokinesis and estimated ejection fraction of 40%. LVEDP by Cath Lab hemodynamics is 40 mmHg.      Patient Profile     73 y.o. female with non-STEMI in the setting of acute viral illness complicated by respiratory failure  Assessment & Plan    NSTEMI:    Plan medical management with DAPT.    ACUTE RESPIRATORY FAILURE.  Sepsis on Antibiotics.  Plan per pulmonary.  She had markedly increased EDP at the time of her cath.  Agree with aggressive diuresis despite mildly increased creatinine.   She is 7 liters positive this admission.  BP does not allow other med titration.     For questions or updates, please contact [place] Please consult [email or web address] for contact info under Cardiology/STEMI.   Signed, Minus Breeding, MD  02/11/2019, 10:48 AM

## 2019-02-12 ENCOUNTER — Inpatient Hospital Stay (HOSPITAL_COMMUNITY): Payer: Medicaid Other

## 2019-02-12 DIAGNOSIS — I25110 Atherosclerotic heart disease of native coronary artery with unstable angina pectoris: Secondary | ICD-10-CM

## 2019-02-12 LAB — BASIC METABOLIC PANEL
Anion gap: 8 (ref 5–15)
BUN: 82 mg/dL — ABNORMAL HIGH (ref 8–23)
CO2: 24 mmol/L (ref 22–32)
CREATININE: 1.68 mg/dL — AB (ref 0.44–1.00)
Calcium: 8 mg/dL — ABNORMAL LOW (ref 8.9–10.3)
Chloride: 104 mmol/L (ref 98–111)
GFR calc Af Amer: 35 mL/min — ABNORMAL LOW (ref >60)
GFR calc non Af Amer: 30 mL/min — ABNORMAL LOW (ref >60)
Glucose, Bld: 310 mg/dL — ABNORMAL HIGH (ref 70–99)
Potassium: 4 mmol/L (ref 3.5–5.1)
Sodium: 136 mmol/L (ref 135–145)

## 2019-02-12 LAB — POCT I-STAT 7, (LYTES, BLD GAS, ICA,H+H)
Acid-base deficit: 1 mmol/L (ref 0.0–2.0)
Bicarbonate: 24.3 mmol/L (ref 20.0–28.0)
Calcium, Ion: 1.16 mmol/L (ref 1.15–1.40)
HCT: 28 % — ABNORMAL LOW (ref 36.0–46.0)
Hemoglobin: 9.5 g/dL — ABNORMAL LOW (ref 12.0–15.0)
O2 Saturation: 99 %
Potassium: 4.4 mmol/L (ref 3.5–5.1)
SODIUM: 137 mmol/L (ref 135–145)
TCO2: 26 mmol/L (ref 22–32)
pCO2 arterial: 42.7 mmHg (ref 32.0–48.0)
pH, Arterial: 7.363 (ref 7.350–7.450)
pO2, Arterial: 162 mmHg — ABNORMAL HIGH (ref 83.0–108.0)

## 2019-02-12 LAB — CBC
HCT: 26.7 % — ABNORMAL LOW (ref 36.0–46.0)
Hemoglobin: 7.7 g/dL — ABNORMAL LOW (ref 12.0–15.0)
MCH: 23.3 pg — ABNORMAL LOW (ref 26.0–34.0)
MCHC: 28.8 g/dL — ABNORMAL LOW (ref 30.0–36.0)
MCV: 80.7 fL (ref 80.0–100.0)
Platelets: 248 10*3/uL (ref 150–400)
RBC: 3.31 MIL/uL — ABNORMAL LOW (ref 3.87–5.11)
RDW: 18.4 % — ABNORMAL HIGH (ref 11.5–15.5)
WBC: 16.3 10*3/uL — ABNORMAL HIGH (ref 4.0–10.5)
nRBC: 0.4 % — ABNORMAL HIGH (ref 0.0–0.2)

## 2019-02-12 LAB — POCT ACTIVATED CLOTTING TIME: Activated Clotting Time: 0 seconds

## 2019-02-12 LAB — GLUCOSE, CAPILLARY
GLUCOSE-CAPILLARY: 202 mg/dL — AB (ref 70–99)
Glucose-Capillary: 264 mg/dL — ABNORMAL HIGH (ref 70–99)
Glucose-Capillary: 281 mg/dL — ABNORMAL HIGH (ref 70–99)
Glucose-Capillary: 187 mg/dL — ABNORMAL HIGH (ref 70–99)
Glucose-Capillary: 139 mg/dL — ABNORMAL HIGH (ref 70–99)

## 2019-02-12 LAB — PHOSPHORUS: Phosphorus: 4.8 mg/dL — ABNORMAL HIGH (ref 2.5–4.6)

## 2019-02-12 LAB — MAGNESIUM: Magnesium: 2.6 mg/dL — ABNORMAL HIGH (ref 1.7–2.4)

## 2019-02-12 MED ORDER — ETOMIDATE 2 MG/ML IV SOLN
20.0000 mg | Freq: Once | INTRAVENOUS | Status: AC
  Administered 2019-02-12: 20 mg via INTRAVENOUS

## 2019-02-12 MED ORDER — FENTANYL CITRATE (PF) 100 MCG/2ML IJ SOLN
INTRAMUSCULAR | Status: AC
  Filled 2019-02-12: qty 2

## 2019-02-12 MED ORDER — MIDAZOLAM HCL 2 MG/2ML IJ SOLN
1.0000 mg | INTRAMUSCULAR | Status: DC | PRN
  Administered 2019-02-13 – 2019-02-14 (×3): 1 mg via INTRAVENOUS
  Filled 2019-02-12 (×3): qty 2

## 2019-02-12 MED ORDER — INSULIN DETEMIR 100 UNIT/ML ~~LOC~~ SOLN
65.0000 [IU] | Freq: Two times a day (BID) | SUBCUTANEOUS | Status: DC
  Filled 2019-02-12 (×2): qty 0.65

## 2019-02-12 MED ORDER — POTASSIUM CHLORIDE 20 MEQ/15ML (10%) PO SOLN
20.0000 meq | Freq: Once | ORAL | Status: AC
  Administered 2019-02-12: 20 meq
  Filled 2019-02-12: qty 15

## 2019-02-12 MED ORDER — FENTANYL CITRATE (PF) 100 MCG/2ML IJ SOLN
INTRAMUSCULAR | Status: AC
  Administered 2019-02-12: 100 ug
  Filled 2019-02-12: qty 2

## 2019-02-12 MED ORDER — IPRATROPIUM-ALBUTEROL 0.5-2.5 (3) MG/3ML IN SOLN
RESPIRATORY_TRACT | Status: AC
  Administered 2019-02-12: 3 mL
  Filled 2019-02-12: qty 3

## 2019-02-12 MED ORDER — ASPIRIN 81 MG PO CHEW
81.0000 mg | CHEWABLE_TABLET | Freq: Every day | ORAL | Status: DC
  Administered 2019-02-13 – 2019-03-05 (×21): 81 mg via ORAL
  Filled 2019-02-12 (×21): qty 1

## 2019-02-12 MED ORDER — HYDRALAZINE HCL 20 MG/ML IJ SOLN
10.0000 mg | INTRAMUSCULAR | Status: DC | PRN
  Administered 2019-02-24: 20 mg via INTRAVENOUS
  Filled 2019-02-12: qty 1

## 2019-02-12 MED ORDER — NITROGLYCERIN IN D5W 200-5 MCG/ML-% IV SOLN: 0.0000 ug/min | INTRAVENOUS | Status: DC

## 2019-02-12 MED ORDER — MIDAZOLAM HCL 2 MG/2ML IJ SOLN: 2.0000 mg | Freq: Once | INTRAMUSCULAR | Status: AC

## 2019-02-12 MED ORDER — ROCURONIUM BROMIDE 50 MG/5ML IV SOLN
100.0000 mg | Freq: Once | INTRAVENOUS | Status: AC
  Administered 2019-02-12: 50 mg via INTRAVENOUS

## 2019-02-12 MED ORDER — MORPHINE SULFATE (PF) 2 MG/ML IV SOLN
2.0000 mg | Freq: Once | INTRAVENOUS | Status: AC
  Administered 2019-02-12: 4 mg via INTRAVENOUS
  Filled 2019-02-12: qty 2

## 2019-02-12 MED ORDER — FENTANYL 2500MCG IN NS 250ML (10MCG/ML) PREMIX INFUSION
25.0000 ug/h | INTRAVENOUS | Status: DC
  Administered 2019-02-12: 25 ug/h via INTRAVENOUS
  Administered 2019-02-13 (×2): 400 ug/h via INTRAVENOUS
  Filled 2019-02-12 (×4): qty 250

## 2019-02-12 MED ORDER — FENTANYL CITRATE (PF) 100 MCG/2ML IJ SOLN: 100.0000 ug | Freq: Once | INTRAMUSCULAR | Status: AC

## 2019-02-12 MED ORDER — MIDAZOLAM HCL 2 MG/2ML IJ SOLN
INTRAMUSCULAR | Status: AC
  Administered 2019-02-12: 2 mg
  Filled 2019-02-12: qty 2

## 2019-02-12 MED ORDER — INSULIN DETEMIR 100 UNIT/ML ~~LOC~~ SOLN
30.0000 [IU] | Freq: Once | SUBCUTANEOUS | Status: AC
  Administered 2019-02-12: 30 [IU] via SUBCUTANEOUS
  Filled 2019-02-12: qty 0.3

## 2019-02-12 MED ORDER — FENTANYL CITRATE (PF) 100 MCG/2ML IJ SOLN
50.0000 ug | INTRAMUSCULAR | Status: DC | PRN
  Administered 2019-02-12 – 2019-02-14 (×2): 50 ug via INTRAVENOUS
  Filled 2019-02-12: qty 2

## 2019-02-12 MED ORDER — FUROSEMIDE 10 MG/ML IJ SOLN
60.0000 mg | Freq: Two times a day (BID) | INTRAMUSCULAR | Status: AC
  Administered 2019-02-12 (×2): 60 mg via INTRAVENOUS
  Filled 2019-02-12 (×2): qty 6

## 2019-02-12 MED ORDER — MIDAZOLAM HCL 2 MG/2ML IJ SOLN
1.0000 mg | INTRAMUSCULAR | Status: AC | PRN
  Administered 2019-02-13 (×3): 1 mg via INTRAVENOUS
  Filled 2019-02-12 (×3): qty 2

## 2019-02-12 MED ORDER — FENTANYL CITRATE (PF) 100 MCG/2ML IJ SOLN
50.0000 ug | INTRAMUSCULAR | Status: DC | PRN
  Administered 2019-02-13 – 2019-02-17 (×3): 50 ug via INTRAVENOUS

## 2019-02-12 MED ORDER — IPRATROPIUM-ALBUTEROL 0.5-2.5 (3) MG/3ML IN SOLN
3.0000 mL | RESPIRATORY_TRACT | Status: DC | PRN
  Administered 2019-03-21 – 2019-03-24 (×2): 3 mL via RESPIRATORY_TRACT
  Filled 2019-02-12 (×2): qty 3

## 2019-02-12 MED ORDER — INSULIN DETEMIR 100 UNIT/ML ~~LOC~~ SOLN
65.0000 [IU] | Freq: Two times a day (BID) | SUBCUTANEOUS | Status: DC
  Administered 2019-02-12: 65 [IU] via SUBCUTANEOUS
  Filled 2019-02-12 (×2): qty 0.65

## 2019-02-12 NOTE — Progress Notes (Signed)
eLink Physician-Brief Progress Note Patient Name: Gabrielle Wright DOB: Apr 19, 1946 MRN: [id number]   Date of Service  02/12/2019  HPI/Events of Note  Pt was reintubated earlier and tube feeds have not been restarted.  Pt has levemir 65 units due tonight.   eICU Interventions  Advised to give 30 units of levemir and restart tube feeds at 7ml and advance as per protocol.      Intervention Category Minor Interventions: Other:  Larinda Buttery 02/12/2019, 10:24 PM

## 2019-02-12 NOTE — Progress Notes (Signed)
SLP Cancellation Note  Patient Details Name: Gabrielle Wright MRN: [id number] DOB: 22-Sep-1946   Cancelled treatment:       Reason Eval/Treat Not Completed: Medical issues which prohibited therapy. Pt remains intubated at this time. SLP will follow up.   Maurica Omura I. Vear Clock, MS, CCC-SLP Acute Rehabilitation Services Office number [phone] Pager [phone]  Scheryl Marten 02/12/2019, 8:03 AM

## 2019-02-12 NOTE — Progress Notes (Signed)
OT Cancellation Note  Patient Details Name: Gabrielle Wright MRN: [id number] DOB: September 16, 1946   Cancelled Treatment:    Reason Eval/Treat Not Completed: Medical issues which prohibited therapy(Per RN, getting ready to extubate pt, asked that therapy hold)  Evern Bio 02/12/2019, 10:35 AM  Martie Round, OTR/L Acute Rehabilitation Services Pager: [phone] Office: [phone]

## 2019-02-12 NOTE — Procedures (Signed)
Intubation Procedure Note Gabrielle Wright [id number] [birth year]  Procedure: Intubation Indications: Respiratory insufficiency  Procedure Details Consent: Risks of procedure as well as the alternatives and risks of each were explained to the (patient/caregiver).  Consent for procedure obtained. Time Out: Verified patient identification, verified procedure, site/side was marked, verified correct patient position, special equipment/implants available, medications/allergies/relevent history reviewed, required imaging and test results available.  Performed  Maximum sterile technique was used including gloves, hand hygiene and mask.  MAC and 3  Versed 2  fent 100 mcg Etomidate 20 Rocuronium 50  Evaluation Hemodynamic Status: BP stable throughout; O2 sats: transiently fell during during procedure Patient's Current Condition: stable Complications: No apparent complications Patient did tolerate procedure well. Chest X-ray ordered to verify placement.  CXR: pending.   Gabrielle Wright Gabrielle Wright 02/12/2019

## 2019-02-12 NOTE — Progress Notes (Signed)
Inpatient Diabetes Program Recommendations  AACE/ADA: New Consensus Statement on Inpatient Glycemic Control (2015)  Target Ranges:  Prepandial:   less than 140 mg/dL      Peak postprandial:   less than 180 mg/dL (1-2 hours)      Critically ill patients:  140 - 180 mg/dL   Lab Results  Component Value Date   GLUCAP 281 (H) 02/12/2019   HGBA1C 10.1 (H) 02/04/2019    Review of Glycemic Control Results for KEAH, PRUSS (MRN [id number]) as of 02/12/2019 08:42  Ref. Range 02/11/2019 19:54 02/11/2019 23:54 02/12/2019 04:09 02/12/2019 07:55  Glucose-Capillary Latest Ref Range: 70 - 99 mg/dL 782 (H) 956 (H) 213 (H) 281 (H)   Admit Hypotension in the setting of fever/ Acute respiratory distress in the setting of fever 103.9/ Suspected viral infection  History: DM Current Orders:Novolog (0-24units)Q4 hours, Levemir 55 units BID, Novolog 5 units Q4H  Inpatient Diabetes Program Recommendations:  Trends continue to exceed inpatient goals, consider:  -Increase Levemir to 60 units BID - Increase Novolog 8 units Q4H (to be held in the event tube feeds are stopped/discontinued).  Thanks, Lujean Rave, MSN, RNC-OB Diabetes Coordinator [phone] (8a-5p)

## 2019-02-12 NOTE — Procedures (Signed)
Extubation Procedure Note  Patient Details:   Name: Gabrielle Wright DOB: 02/17/1946 MRN: [id number]   Airway Documentation:    Vent end date: 02/12/19 Vent end time: 1133   Evaluation  O2 sats: stable throughout Complications: No apparent complications Patient did tolerate procedure well. Bilateral Breath Sounds: Diminished(coarse)   Pt extubated per MD order.  Pt had + cuff leak, placed on 4L [place] tolerating well, no distress noted at this time   Cherylin Mylar 02/12/2019, 11:38 AM

## 2019-02-12 NOTE — Progress Notes (Signed)
Pt placed on Bipap at this time due to increased WOB & RR. MD aware and is coming to bedside to evaluate pt.

## 2019-02-12 NOTE — Progress Notes (Signed)
Progress Note  Patient Name: Gabrielle Wright Date of Encounter: 02/12/2019  Primary Cardiologist:   No primary care provider on file.   Subjective   She denies chest pain.  She is intubated but able to respond  Inpatient Medications    Scheduled Meds:  aspirin EC  81 mg Oral Daily   chlorhexidine gluconate (MEDLINE KIT)  15 mL Mouth Rinse BID   Chlorhexidine Gluconate Cloth  6 each Topical Q0600   clopidogrel  75 mg Oral Daily   furosemide  60 mg Intravenous Q12H   heparin  5,000 Units Subcutaneous Q8H   insulin aspart  0-24 Units Subcutaneous Q4H   insulin aspart  5 Units Subcutaneous Q4H   insulin detemir  65 Units Subcutaneous BID   mouth rinse  15 mL Mouth Rinse 10 times per day   metoCLOPramide (REGLAN) injection  5 mg Intravenous Q8H   pantoprazole (PROTONIX) IV  40 mg Intravenous QHS   polyethylene glycol  17 g Oral Daily   [COMPLETED] potassium chloride  20 mEq Per Tube Once   sodium chloride flush  10-40 mL Intracatheter Q12H   Continuous Infusions:  sodium chloride Stopped (02/10/19 0000)   dexmedetomidine (PRECEDEX) IV infusion 0.8 mcg/kg/hr (02/12/19 0940)   norepinephrine (LEVOPHED) Adult infusion Stopped (02/10/19 0533)   penicillin g continuous IV infusion 41.7 mL/hr at 02/12/19 0600   PRN Meds: acetaminophen (TYLENOL) oral liquid 160 mg/5 mL, acetaminophen, dextrose, ondansetron (ZOFRAN) IV, sodium chloride flush   Vital Signs    Vitals:   02/12/19 1000 02/12/19 1015 02/12/19 1030 02/12/19 1045  BP: (!) 169/70 (!) 125/59 112/60 (!) 121/58  Pulse: 98 69 62 66  Resp: (!) 27 (!) 25 20 (!) 23  Temp:      TempSrc:      SpO2: 95% 95% 97% 97%  Weight:      Height:        Intake/Output Summary (Last 24 hours) at 02/12/2019 1111 Last data filed at 02/12/2019 1000 Gross per 24 hour  Intake 2774.99 ml  Output 4052 ml  Net -1277.01 ml   Filed Weights   02/10/19 0351 02/11/19 0321 02/12/19 0500  Weight: 85 kg 87.6 kg 88 kg     Telemetry    NSR - Personally Reviewed  ECG    NA - Personally Reviewed  Physical Exam   GEN:    Intubated sedated Neck: No  JVD Cardiac: RRR, no murmurs, rubs, or gallops.  Respiratory:    Decreased breath sounds. GI: Soft, nontender, non-distended  MS: No edema; No deformity. Neuro:  Nonfocal  Psych: Normal affect   Labs    Chemistry Recent Labs  Lab 02/10/19 0437 02/11/19 0941 02/12/19 0406  NA 132* 135 136  K 4.7 4.7 4.0  CL 99 103 104  CO2 _0 GLUCOSE 425* 302* 310*  BUN 69* 85* 82*  CREATININE 1.79* 1.86* 1.68*  CALCIUM 8.1* 8.2* 8.0*  GFRNONAA 28* 27* 30*  GFRAA 32* 31* 35*  ANIONGAP _1 Hematology Recent Labs  Lab 02/10/19 0437 02/11/19 0941 02/12/19 0406  WBC 12.7* 17.3* 16.3*  RBC 3.24* 3.28* 3.31*  HGB 7.9* 7.8* 7.7*  HCT 26.3* 26.3* 26.7*  MCV 81.2 80.2 80.7  MCH 24.4* 23.8* 23.3*  MCHC 30.0 29.7* 28.8*  RDW 18.1* 18.2* 18.4*  PLT 201 218 248    Cardiac Enzymes Recent Labs  Lab 02/09/19 1215 02/09/19 1734 02/09/19 2306  TROPONINI 9.14* 10.62* 9.58*   No  results for input(s): TROPIPOC in the last 168 hours.   BNPNo results for input(s): BNP, PROBNP in the last 168 hours.   DDimer No results for input(s): DDIMER in the last 168 hours.   Radiology    Dg Abd 1 View  Result Date: 02/10/2019 CLINICAL DATA:  Abdominal distention EXAM: ABDOMEN - 1 VIEW COMPARISON:  06/11/2018 abdominal CT FINDINGS: Orogastric tube tip overlaps the stomach. Colonic distension sparing the rectum, containing stool proximally and gas distally. No gas dilated small bowel dilatation. No obstructive process seen in the rectum on 2019 abdominal CT. IMPRESSION: Diffusely distended is colon, sparing the rectum. Clinical circumstances favors ileus over low obstruction. Retained stool is seen proximally and distally the colon is gas filled. Electronically Signed   By: Monte Fantasia M.D.   On: 02/10/2019 11:53   Dg Chest Port 1 View  Result  Date: 02/12/2019 CLINICAL DATA:  73 year old female with acute respiratory failure. EXAM: PORTABLE CHEST 1 VIEW COMPARISON:  02/11/2019 and earlier. FINDINGS: Portable AP semi upright view at 0540 hours. Endotracheal tube tip at the level the clavicles. Enteric tube courses to the abdomen, side hole at the level of the gastric body. Stable left subclavian central line. Stable cardiac size and mediastinal contours. Calcified aortic atherosclerosis. Stable to mildly improved lung volumes. Improved bibasilar ventilation with decreased veiling opacity and improved visualization of the diaphragm. Residual confluent retrocardiac opacity. No pneumothorax or pulmonary edema. No pleural effusion is evident. Paucity of bowel gas in the upper abdomen. There appears to be some retained barium contrast in bowel. IMPRESSION: 1. Stable lines and tubes. 2. Improved lung volumes and basilar ventilation. Residual retrocardiac atelectasis or consolidation. 3.  Aortic Atherosclerosis (ICD10-I70.0). Electronically Signed   By: Genevie Ann M.D.   On: 02/12/2019 07:23   Dg Chest Port 1 View  Result Date: 02/11/2019 CLINICAL DATA:  Hypoxia EXAM: PORTABLE CHEST 1 VIEW COMPARISON:  February 09, 2019 FINDINGS: Endotracheal tube tip is 3.4 cm above the carina. Central catheter tip is at the cavoatrial junction. Nasogastric tube tip and side port are below the diaphragm. No pneumothorax. There are pleural effusions bilaterally with patchy airspace consolidation in each lung base. There is mild interstitial edema. There is cardiomegaly with pulmonary venous hypertension. There is aortic atherosclerosis. No adenopathy. No bone lesions. IMPRESSION: Tube and catheter positions as described without pneumothorax. There is pulmonary vascular congestion with interstitial edema and pleural effusions bilaterally. Suspect a degree of underlying congestive heart failure. Patchy consolidation in the lung bases may represent alveolar edema. A degree of  superimposed pneumonia cannot be excluded radiographically. Electronically Signed   By: Lowella Grip III M.D.   On: 02/11/2019 07:37    Cardiac Studies   Echo: IMPRESSIONS   1. The left ventricle has moderately reduced systolic function, with an ejection fraction of 35-40%. The cavity size was normal. Left ventricular diastolic Doppler parameters are consistent with pseudonormalization. Elevated left ventricular  end-diastolic pressure The E/e' is 19. Regional wall motion abnormalities (see coded diagram below). 2. The right ventricle has mildly reduced systolic function. The cavity was normal. There is no increase in right ventricular wall thickness. 3. No hemodynamically significant valvular heart disease. 4. Normal biatrial chamber size. 5. IVC not well visualized.  FINDINGS Left Ventricle: The left ventricle has moderately reduced systolic function, with an ejection fraction of 35-40%. The cavity size was normal. There is no increase in left ventricular wall thickness. Left ventricular diastolic Doppler parameters are  consistent with pseudonormalization. Elevated left ventricular  end-diastolic pressure The E/e' is 27. The entire apex, mid and apical anterior septum, and mid and apical inferior septum are akinetic. The basal and mid anterior wall, mid inferolateral segment, mid anterolateral segment, and mid inferior segment are hypokinetic. All remaining scored segments are normal.   Cardiac Cath 02/09/2019:     2nd Mrg-3 lesion is 80% stenosed.  Acute Mrg lesion is 90% stenosed.   Less than 25% left main  Proximal diffuse 30 to 40% narrowing within the previously placed LAD stent. The entire apical LAD is diffusely diseased with up to 85% stenosis. The first diagonal contains 85% ostial narrowing.  Circumflex contains diffuse proximal to mid eccentric 50% stenosis the dominant second obtuse marginal contains mid vessel 50% stenosis in the mid to  distal portion of this obtuse marginal there is a patent stent with 20% narrowing, and just distal to the stent there is an 80% thrombotic stenosis.  The RCA is dominant containing 50% mid vessel stenosis, 70% distal stenosis, 70% ostial PDA stenosis, and total occlusion of the mid vessel that fills by collaterals from the left ventricular apex.  Left ventriculography is not performed. Today's echo was reviewed and there is anteroapical severe hypokinesis and estimated ejection fraction of 40%. LVEDP by Cath Lab hemodynamics is 40 mmHg.      Patient Profile     73 y.o. female with non-STEMI in the setting of acute viral illness complicated by respiratory failure  Assessment & Plan    NSTEMI:    Plan medical management with DAPT. Not a candidate for PCI   ACUTE RESPIRATORY FAILURE.  Sepsis on Antibiotics.  Plan per pulmonary.  She had markedly increased EDP at the time of her cath.  Agree with aggressive diuresis. I/O negative 3.5 liters yesterday. CXR is improved and renal function is stable.     For questions or updates, please contact [place] Please consult [email or web address] for contact info under Cardiology/STEMI.   Signed, Jyasia Markoff [place], MD  02/12/2019, 11:11 AM

## 2019-02-12 NOTE — Progress Notes (Signed)
NAME:  Gabrielle Wright, MRN:  [id number], DOB:  23-Aug-1946, LOS: 8 ADMISSION DATE:  02/03/2019, CONSULTATION DATE: 02/04/2019 REFERRING MD: Emergency department physician cHIEF COMPLAINT: Fever respiratory failure  Brief History   73 year old elderly woman visiting from [place], admitted 3/10 with fevers, hypotension, found to have group A strep bacteremia, required mechanical ventilation Course complicated by non-STEMI and new LV dysfunction   Past Medical History  Hypertension Coronary artery disease Suspected diabetes review of her arterial consult receiving correct.  Significant Hospital IsEvents   02/04/2019 transfer from [facility] to [facility] required intubation and pressors. 3/16 off Levophed  Consults:  02/04/2019 ID   Procedures:  02/04/2019 intubation>> 02/04/2019 CVL>>  Significant Diagnostic Tests:  Echo 3/15 decreased EF 35 to 40%, LVEDP 27, no significant MR Cardiac cath >> LVEDP 40, RCA totally occluded, patent OM1 stent but distally occluded, apical LAD diffusely diseased, no intervention  Micro Data:  02/04/2019 blood cultures x2>> Gr A strep 02/04/2019 sputum culture>> neg 02/04/2019 urine culture>>neg 02/04/2019 respiratory virus panel>> rhinovirus 02/04/2019 flu a and B>> neg Possible coronavirus testing 02/04/2019>>neg   Antimicrobials:  02/04/2019 vancomycin>>3/ 10 02/04/2019 Zosyn>> 3/19 Pen G 3/10 >> 3/22 (plan)  Interim history/subjective:   Much calmer this morning, remains on Precedex Critically ill, intubated Good diuresis with Lasix, about 3.5 L, -1 L  Objective   Blood pressure (!) 98/52, pulse 68, temperature 98.7 F (37.1 C), temperature source Oral, resp. rate (!) 29, height 5\' 6"  (1.676 m), weight 88 kg, SpO2 100 %.    Vent Mode: PSV;CPAP FiO2 (%):  [30 %] 30 % Set Rate:  [16 bmp] 16 bmp Vt Set:  [550 mL] 550 mL PEEP:  [5 cmH20] 5 cmH20 Pressure Support:  [15 cmH20] 15 cmH20 Plateau Pressure:  [22 cmH20-30 cmH20] 30  cmH20   Intake/Output Summary (Last 24 hours) at 02/12/2019 0836 Last data filed at 02/12/2019 0700 Gross per 24 hour  Intake 2594.35 ml  Output 3352 ml  Net -757.65 ml   Filed Weights   02/10/19 0351 02/11/19 0321 02/12/19 0500  Weight: 85 kg 87.6 kg 88 kg   Examination: Elderly woman, intubated, no distress No pallor, icterus On Precedex, RA SS 0, anxious affect, eyes wide open Decreased breath sounds bilateral , tolerating pressure support Soft distended abdomen, nontender S1-S2 regular on monitor deCreased puffiness of hands 1+ edema feet  Chest x-ray 3/18 personally reviewed which shows improved aeration and decrease edema  Resolved Hospital Problem list     Assessment & Plan:  Septic shock/group A strep bacteremia - Viral testing  negative for COVID, positive for rhino  -Off pressors, continue penicillin until 3/22  Acute respiratory failure/acute pulmonary edema - SBTs with goal extubation, decrease to pressure support 10/5, tidal volumes 3 80-400 range -Continue Precedex with RA SS goal 0  -ct  diuresis  AKI  -due to sepsis/ARB and now diuretics Hyperkalemia - resolved -Lasix 40 every 12  Ischemic cardiomyopathy/LVEDP on cath with no targets for revascularization -  ASA + Plavix -?  Need for hydralazine nitrates since we cannot do ARB -hope to add once blood pressure improves  Diabetes type 2/uncontrolled hyperglycemia -Was on high dose of 70/30 at home -Increase Levemir to 60 twice daily SSI resistant ,ct tube feed coverage -Use insulin drip only if CBG more than 400  Abdominal distention/ Ileus -minimize intermittent fentanyl BM x 1 on 3/17  Reglan 5 q 86 doses Ct  tube feeds    Daughter updated at bedside daily  The patient is critically ill with multiple organ systems failure and requires high complexity decision making for assessment and support, frequent evaluation and titration of therapies, application of advanced monitoring technologies and  extensive interpretation of multiple databases. Critical Care Time devoted to patient care services described in this note independent of APP/resident  time is 35 minutes.     Cyril Mourning MD. Tonny Bollman. [facility] Pager [phone] If no response call [phone]   02/12/2019

## 2019-02-13 ENCOUNTER — Inpatient Hospital Stay (HOSPITAL_COMMUNITY): Payer: Medicaid Other

## 2019-02-13 LAB — BASIC METABOLIC PANEL
ANION GAP: 12 (ref 5–15)
BUN: 79 mg/dL — ABNORMAL HIGH (ref 8–23)
CHLORIDE: 101 mmol/L (ref 98–111)
CO2: 25 mmol/L (ref 22–32)
Calcium: 8.3 mg/dL — ABNORMAL LOW (ref 8.9–10.3)
Creatinine, Ser: 1.59 mg/dL — ABNORMAL HIGH (ref 0.44–1.00)
GFR calc Af Amer: 37 mL/min — ABNORMAL LOW (ref >60)
GFR calc non Af Amer: 32 mL/min — ABNORMAL LOW (ref >60)
Glucose, Bld: 82 mg/dL (ref 70–99)
Potassium: 4.3 mmol/L (ref 3.5–5.1)
Sodium: 138 mmol/L (ref 135–145)

## 2019-02-13 LAB — CBC
HCT: 27.5 % — ABNORMAL LOW (ref 36.0–46.0)
Hemoglobin: 8.3 g/dL — ABNORMAL LOW (ref 12.0–15.0)
MCH: 24.8 pg — ABNORMAL LOW (ref 26.0–34.0)
MCHC: 30.2 g/dL (ref 30.0–36.0)
MCV: 82.1 fL (ref 80.0–100.0)
NRBC: 0.2 % (ref 0.0–0.2)
Platelets: 301 10*3/uL (ref 150–400)
RBC: 3.35 MIL/uL — ABNORMAL LOW (ref 3.87–5.11)
RDW: 19 % — ABNORMAL HIGH (ref 11.5–15.5)
WBC: 20.6 10*3/uL — ABNORMAL HIGH (ref 4.0–10.5)

## 2019-02-13 LAB — GLUCOSE, CAPILLARY
Glucose-Capillary: 139 mg/dL — ABNORMAL HIGH (ref 70–99)
Glucose-Capillary: 145 mg/dL — ABNORMAL HIGH (ref 70–99)
Glucose-Capillary: 181 mg/dL — ABNORMAL HIGH (ref 70–99)
Glucose-Capillary: 308 mg/dL — ABNORMAL HIGH (ref 70–99)
Glucose-Capillary: 70 mg/dL (ref 70–99)
Glucose-Capillary: 88 mg/dL (ref 70–99)
Glucose-Capillary: 89 mg/dL (ref 70–99)

## 2019-02-13 LAB — COOXEMETRY PANEL
CARBOXYHEMOGLOBIN: 1.7 % — AB (ref 0.5–1.5)
Carboxyhemoglobin: 1.3 % (ref 0.5–1.5)
Carboxyhemoglobin: 1.2 % (ref 0.5–1.5)
Methemoglobin: 1.1 % (ref 0.0–1.5)
Methemoglobin: 2 % — ABNORMAL HIGH (ref 0.0–1.5)
Methemoglobin: 1.3 % (ref 0.0–1.5)
O2 SAT: 52.1 %
O2 Saturation: 49.6 %
O2 Saturation: 64 %
Total hemoglobin: 8.7 g/dL — ABNORMAL LOW (ref 12.0–16.0)
Total hemoglobin: 9.1 g/dL — ABNORMAL LOW (ref 12.0–16.0)
Total hemoglobin: 7.9 g/dL — ABNORMAL LOW (ref 12.0–16.0)

## 2019-02-13 LAB — MAGNESIUM: MAGNESIUM: 2.5 mg/dL — AB (ref 1.7–2.4)

## 2019-02-13 LAB — PHOSPHORUS: Phosphorus: 6.5 mg/dL — ABNORMAL HIGH (ref 2.5–4.6)

## 2019-02-13 MED ORDER — NOREPINEPHRINE 4 MG/250ML-% IV SOLN
0.0000 ug/min | INTRAVENOUS | Status: DC
  Administered 2019-02-13: 2 ug/min via INTRAVENOUS
  Filled 2019-02-13: qty 250

## 2019-02-13 MED ORDER — FUROSEMIDE 10 MG/ML IJ SOLN
80.0000 mg | Freq: Two times a day (BID) | INTRAMUSCULAR | Status: DC
  Administered 2019-02-13 – 2019-02-15 (×4): 80 mg via INTRAVENOUS
  Filled 2019-02-13 (×5): qty 8

## 2019-02-13 MED ORDER — VITAL AF 1.2 CAL PO LIQD
1000.0000 mL | ORAL | Status: DC
  Administered 2019-02-13: 1000 mL

## 2019-02-13 MED ORDER — FENTANYL BOLUS VIA INFUSION
25.0000 ug | INTRAVENOUS | Status: DC | PRN
  Administered 2019-02-16 – 2019-02-19 (×3): 25 ug via INTRAVENOUS
  Filled 2019-02-13: qty 25

## 2019-02-13 MED ORDER — FENTANYL CITRATE (PF) 100 MCG/2ML IJ SOLN: 50.0000 ug | Freq: Once | INTRAMUSCULAR | Status: DC

## 2019-02-13 MED ORDER — PIVOT 1.5 CAL PO LIQD
1000.0000 mL | ORAL | Status: DC
  Filled 2019-02-13: qty 1000

## 2019-02-13 MED ORDER — MILRINONE LACTATE IN DEXTROSE 20-5 MG/100ML-% IV SOLN
0.3750 ug/kg/min | INTRAVENOUS | Status: DC
  Administered 2019-02-13: 0.375 ug/kg/min via INTRAVENOUS
  Administered 2019-02-13: 0.25 ug/kg/min via INTRAVENOUS
  Administered 2019-02-14 – 2019-02-17 (×9): 0.375 ug/kg/min via INTRAVENOUS
  Filled 2019-02-13 (×15): qty 100

## 2019-02-13 MED ORDER — ADULT MULTIVITAMIN LIQUID CH
15.0000 mL | Freq: Every day | ORAL | Status: DC
  Administered 2019-02-13 – 2019-03-21 (×37): 15 mL
  Filled 2019-02-13 (×37): qty 15

## 2019-02-13 MED ORDER — INSULIN DETEMIR 100 UNIT/ML ~~LOC~~ SOLN
45.0000 [IU] | Freq: Two times a day (BID) | SUBCUTANEOUS | Status: DC
  Administered 2019-02-13: 45 [IU] via SUBCUTANEOUS
  Filled 2019-02-13 (×2): qty 0.45

## 2019-02-13 MED ORDER — PROPOFOL 1000 MG/100ML IV EMUL
0.0000 ug/kg/min | INTRAVENOUS | Status: DC
  Administered 2019-02-14 (×2): 40 ug/kg/min via INTRAVENOUS
  Administered 2019-02-14: 10 ug/kg/min via INTRAVENOUS
  Administered 2019-02-15: 35 ug/kg/min via INTRAVENOUS
  Administered 2019-02-15: 15 ug/kg/min via INTRAVENOUS
  Administered 2019-02-15 – 2019-02-17 (×5): 20 ug/kg/min via INTRAVENOUS
  Administered 2019-02-17: 15 ug/kg/min via INTRAVENOUS
  Administered 2019-02-17: 25 ug/kg/min via INTRAVENOUS
  Administered 2019-02-18: 10 ug/kg/min via INTRAVENOUS
  Administered 2019-02-18: 25 ug/kg/min via INTRAVENOUS
  Administered 2019-02-19 (×2): 20 ug/kg/min via INTRAVENOUS
  Filled 2019-02-13 (×18): qty 100

## 2019-02-13 MED ORDER — PRO-STAT SUGAR FREE PO LIQD
30.0000 mL | Freq: Three times a day (TID) | ORAL | Status: DC
  Administered 2019-02-13 – 2019-02-17 (×14): 30 mL
  Filled 2019-02-13 (×13): qty 30

## 2019-02-13 MED ORDER — FUROSEMIDE 10 MG/ML IJ SOLN
60.0000 mg | Freq: Two times a day (BID) | INTRAMUSCULAR | Status: DC
  Administered 2019-02-13: 60 mg via INTRAVENOUS
  Filled 2019-02-13: qty 6

## 2019-02-13 MED ORDER — VITAL AF 1.2 CAL PO LIQD
1000.0000 mL | ORAL | Status: DC
  Administered 2019-02-15: 1000 mL
  Filled 2019-02-13 (×2): qty 1000

## 2019-02-13 NOTE — Progress Notes (Addendum)
Progress Note  Patient Name: Gabrielle Wright Date of Encounter: 02/13/2019  Primary Cardiologist:   Minus Breeding, MD   Subjective   Awake on the vent, denies pain (I think she understood the question)  Inpatient Medications    Scheduled Meds: . aspirin  81 mg Oral Daily  . chlorhexidine gluconate (MEDLINE KIT)  15 mL Mouth Rinse BID  . Chlorhexidine Gluconate Cloth  6 each Topical Q0600  . clopidogrel  75 mg Oral Daily  . furosemide  60 mg Intravenous Q12H  . heparin  5,000 Units Subcutaneous Q8H  . insulin aspart  0-24 Units Subcutaneous Q4H  . insulin aspart  5 Units Subcutaneous Q4H  . insulin detemir  65 Units Subcutaneous BID  . mouth rinse  15 mL Mouth Rinse 10 times per day  . pantoprazole (PROTONIX) IV  40 mg Intravenous QHS  . polyethylene glycol  17 g Oral Daily  . sodium chloride flush  10-40 mL Intracatheter Q12H   Continuous Infusions: . sodium chloride 20 mL/hr at 02/13/19 0600  . dexmedetomidine (PRECEDEX) IV infusion 1 mcg/kg/hr (02/13/19 0600)  . feeding supplement (VITAL AF 1.2 CAL)    . fentaNYL infusion INTRAVENOUS 350 mcg/hr (02/13/19 0600)  . nitroGLYCERIN Stopped (02/12/19 1559)  . norepinephrine (LEVOPHED) Adult infusion Stopped (02/10/19 0533)  . penicillin g continuous IV infusion 41.7 mL/hr at 02/13/19 0600   PRN Meds: acetaminophen (TYLENOL) oral liquid 160 mg/5 mL, acetaminophen, dextrose, fentaNYL (SUBLIMAZE) injection, fentaNYL (SUBLIMAZE) injection, hydrALAZINE, ipratropium-albuterol, midazolam, midazolam, ondansetron (ZOFRAN) IV, sodium chloride flush   Vital Signs    Vitals:   02/13/19 0500 02/13/19 0600 02/13/19 0735 02/13/19 0800  BP: 94/60 (!) 84/53  (!) 103/58  Pulse: 67 63  71  Resp: _0 Temp:   (!) 96.9 F (36.1 C)   TempSrc:   Axillary   SpO2: 96% 100%  100%  Weight:      Height:        Intake/Output Summary (Last 24 hours) at 02/13/2019 3832 Last data filed at 02/13/2019 0600 Gross per 24 hour  Intake 2045.2  ml  Output 2050 ml  Net -4.8 ml   Filed Weights   02/11/19 0321 02/12/19 0500 02/13/19 0451  Weight: 87.6 kg 88 kg 90 kg    Telemetry    SR, ST briefly to the 140s - Personally Reviewed  ECG    NA - Personally Reviewed  Physical Exam   General: Well developed, well nourished, female in no acute distress Head: Eyes PERRLA, No xanthomas.   Normocephalic and atraumatic Lungs: rales bases bilaterally to auscultation. Heart: HRRR S1 S2, without MRG.  Pulses are 2+ & equal. Mild JVD. Abdomen: Bowel sounds are present, abdomen soft and non-tender without masses or  hernias noted. Msk: Not able to evaluate due to condition Extremities: No clubbing, cyanosis or edema.    Skin:  No rashes or lesions noted. Neuro: Alert on the vent  Labs    Chemistry Recent Labs  Lab 02/11/19 0941 02/12/19 0406 02/12/19 1552 02/13/19 0502  NA 135 136 137 138  K 4.7 4.0 4.4 4.3  CL 103 104  --  101  CO2 22 24  --  25  GLUCOSE 302* 310*  --  82  BUN 85* 82*  --  79*  CREATININE 1.86* 1.68*  --  1.59*  CALCIUM 8.2* 8.0*  --  8.3*  GFRNONAA 27* 30*  --  32*  GFRAA 31* 35*  --  37*  ANIONGAP  10 8  --  12     Hematology Recent Labs  Lab 02/11/19 0941 02/12/19 0406 02/12/19 1552 02/13/19 0502  WBC 17.3* 16.3*  --  20.6*  RBC 3.28* 3.31*  --  3.35*  HGB 7.8* 7.7* 9.5* 8.3*  HCT 26.3* 26.7* 28.0* 27.5*  MCV 80.2 80.7  --  82.1  MCH 23.8* 23.3*  --  24.8*  MCHC 29.7* 28.8*  --  30.2  RDW 18.2* 18.4*  --  19.0*  PLT 218 248  --  301    Cardiac Enzymes Recent Labs  Lab 02/09/19 1215 02/09/19 1734 02/09/19 2306  TROPONINI 9.14* 10.62* 9.58*   No results for input(s): TROPIPOC in the last 168 hours.   BNPNo results for input(s): BNP, PROBNP in the last 168 hours.    Radiology    Portable Chest Xray  Result Date: 02/13/2019 CLINICAL DATA:  Intubation. EXAM: PORTABLE CHEST 1 VIEW COMPARISON:  02/12/2019. FINDINGS: Endotracheal tube, NG tube, left subclavian line noted stable  position. Heart size stable. Diffuse bilateral pulmonary infiltrates/edema again noted. Similar findings noted on prior exam. Small bilateral pleural effusions noted on today's exam. No pneumothorax. IMPRESSION: 1.  Lines and tubes in stable position. 2. Diffuse bilateral pulmonary infiltrates/edema again noted. Similar findings noted on prior exam. Small bilateral pleural effusions noted on today's exam. Electronically Signed   By: Marcello Moores  Register   On: 02/13/2019 06:28   Portable Chest X-ray  Result Date: 02/12/2019 CLINICAL DATA:  73 year old female with respiratory failure. Intubated, enteric tube placement. EXAM: PORTABLE CHEST 1 VIEW COMPARISON:  0540 hours today. FINDINGS: AP view at 1259 hours. Endotracheal tube tip in good position between the clavicles and carina. Enteric tube courses to the abdomen, side hole at the level of the gastric fundus. Stable left subclavian central line. Progressed from this morning bilateral interstitial and indistinct widespread pulmonary opacity. This resembles the appearance on 02/11/2019. No superimposed pneumothorax. No definite pleural effusion. Continued retrocardiac opacity which may be atelectasis. IMPRESSION: 1. Stable lines and tubes. 2. Progressed bilateral interstitial and indistinct pulmonary opacity since this morning favored to be acute pulmonary edema. Electronically Signed   By: Genevie Ann M.D.   On: 02/12/2019 13:29   Dg Chest Port 1 View  Result Date: 02/12/2019 CLINICAL DATA:  73 year old female with acute respiratory failure. EXAM: PORTABLE CHEST 1 VIEW COMPARISON:  02/11/2019 and earlier. FINDINGS: Portable AP semi upright view at 0540 hours. Endotracheal tube tip at the level the clavicles. Enteric tube courses to the abdomen, side hole at the level of the gastric body. Stable left subclavian central line. Stable cardiac size and mediastinal contours. Calcified aortic atherosclerosis. Stable to mildly improved lung volumes. Improved bibasilar  ventilation with decreased veiling opacity and improved visualization of the diaphragm. Residual confluent retrocardiac opacity. No pneumothorax or pulmonary edema. No pleural effusion is evident. Paucity of bowel gas in the upper abdomen. There appears to be some retained barium contrast in bowel. IMPRESSION: 1. Stable lines and tubes. 2. Improved lung volumes and basilar ventilation. Residual retrocardiac atelectasis or consolidation. 3.  Aortic Atherosclerosis (ICD10-I70.0). Electronically Signed   By: Genevie Ann M.D.   On: 02/12/2019 07:23    Cardiac Studies   Echo: IMPRESSIONS   1. The left ventricle has moderately reduced systolic function, with an ejection fraction of 35-40%. The cavity size was normal. Left ventricular diastolic Doppler parameters are consistent with pseudonormalization. Elevated left ventricular  end-diastolic pressure The E/e' is 66. Regional wall motion abnormalities (see coded diagram  below). 2. The right ventricle has mildly reduced systolic function. The cavity was normal. There is no increase in right ventricular wall thickness. 3. No hemodynamically significant valvular heart disease. 4. Normal biatrial chamber size. 5. IVC not well visualized.  FINDINGS Left Ventricle: The left ventricle has moderately reduced systolic function, with an ejection fraction of 35-40%. The cavity size was normal. There is no increase in left ventricular wall thickness. Left ventricular diastolic Doppler parameters are  consistent with pseudonormalization. Elevated left ventricular end-diastolic pressure The E/e' is 61. The entire apex, mid and apical anterior septum, and mid and apical inferior septum are akinetic. The basal and mid anterior wall, mid inferolateral segment, mid anterolateral segment, and mid inferior segment are hypokinetic. All remaining scored segments are normal.   Cardiac Cath 02/09/2019:     2nd Mrg-3 lesion is 80% stenosed.  Acute Mrg  lesion is 90% stenosed.   Less than 25% left main  Proximal diffuse 30 to 40% narrowing within the previously placed LAD stent. The entire apical LAD is diffusely diseased with up to 85% stenosis. The first diagonal contains 85% ostial narrowing.  Circumflex contains diffuse proximal to mid eccentric 50% stenosis the dominant second obtuse marginal contains mid vessel 50% stenosis in the mid to distal portion of this obtuse marginal there is a patent stent with 20% narrowing, and just distal to the stent there is an 80% thrombotic stenosis.  The RCA is dominant containing 50% mid vessel stenosis, 70% distal stenosis, 70% ostial PDA stenosis, and total occlusion of the mid vessel that fills by collaterals from the left ventricular apex.  Left ventriculography is not performed. Today's echo was reviewed and there is anteroapical severe hypokinesis and estimated ejection fraction of 40%. LVEDP by Cath Lab hemodynamics is 40 mmHg.      Patient Profile     72 y.o. female with non-STEMI in the setting of acute viral illness complicated by respiratory failure  Assessment & Plan    NSTEMI:     - see cath report, med mgt - BP too low for BB/ACE/ARB - on DAPT  ACUTE RESPIRATORY FAILURE.   - +Sepsis w/ WBCs trending up>>per CCM  ACUTE PULMONARY EDEMA, ACUTE SYSTOLIC CHF:  - LVEDP 40 at cath>>diurese - on Lasix 60 mg IV q 12 h and diuresIng 2 L/day but I/O only slightly negative - Review options w/ MD  ICM  - unable to add BB/ACE/ARB/Entresto due to low BP  For questions or updates, please contact [facility] Please consult [email or web address] for contact info under Cardiology/STEMI.   Signed, Rosaria Ferries, PA-C  02/13/2019, 9:07 AM     I have seen and examined the patient along with Rosaria Ferries, PA-C .  I have reviewed the chart, notes and new data.  I agree with PA/NP's note.  Key new complaints: on ventilator, no overt distress Key examination changes: remains  hypotensive, good UO, but receiving >2L fluids/ 24h. Key new findings / data: renal parameters abnormal, but improving trend  PLAN: Increase furosemide to 80 mg twice daily. Recheck limited echo on 03/22. 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 (although she has regional abnormalities and significant CAD, the areas of severe hypokinesis/akinesis do not match the coronary disease, but are more c/w stress cardiomyopathy).  Sanda Klein, MD, [place] [phone] 02/13/2019, 11:59 AM

## 2019-02-13 NOTE — Progress Notes (Signed)
Nutrition Follow-up  DOCUMENTATION CODES:   Not applicable  INTERVENTION:   Resume TF via OGT:  Vital AF 1.2 at 40 ml/h (960 ml per day)  Pro-stat 30 ml TID  Provides 1452 kcal, 117 gm protein, 779 ml free water daily  NUTRITION DIAGNOSIS:   Inadequate oral intake related to inability to eat as evidenced by NPO status.  ongoing  GOAL:   Patient will meet greater than or equal to 90% of their needs  Met with TF  MONITOR:   Vent status, TF tolerance, Labs, I & O's  REASON FOR ASSESSMENT:   Consult Enteral/tube feeding initiation and management  ASSESSMENT:   73 yo female with PMH of DM-2, HTN, CAD from [place] who was admitted with fever and respiratory failure requiring intubation. Admission complicated by NSTEMI and new LV dysfunction.    Patient was extubated briefly on 3/18; required re-intubation due to pulmonary edema. Cardiology consulting to evaluate LV dysfunction.   Coronavirus test was negative.  Patient is currently intubated on ventilator support MV: 7 L/min Temp (24hrs), Avg:97.7 F (36.5 C), Min:96.9 F (36.1 C), Max:98.5 F (36.9 C)   Labs reviewed. Phosphorus 6.5 (H), magnesium 2.5 (H) Medications reviewed and include lasix, levemir, miralax, precedex, fentanyl.  Weight trending up for the past few days.  I/O + 6.5 L since admit.  Diet Order:   Diet Order            Diet NPO time specified  Diet effective now              EDUCATION NEEDS:   No education needs have been identified at this time  Skin:  Skin Assessment: Reviewed RN Assessment  Last BM:  3/18 (type 7)  Height:   Ht Readings from Last 1 Encounters:  02/04/19 5' 6"  (1.676 m)    Weight:   Wt Readings from Last 1 Encounters:  02/13/19 90 kg   Admission weight 80.2 kg (BMI=28.6)  Ideal Body Weight:  59.1 kg  Estimated Nutritional Needs:   Kcal:  1465  Protein:  110-120 gm  Fluid:  1.5 L    Molli Barrows, RD, LDN, [place] Pager  [phone] After Hours Pager [phone]

## 2019-02-13 NOTE — Progress Notes (Signed)
Physical Therapy Treatment Patient Details Name: Gabrielle Wright MRN: [id number] DOB: 07/31/1946 Today's Date: 02/13/2019    History of Present Illness 73 y.o. female admitted on 02/03/19 for fever and cough (to [facility]) and was transferred to [facility] on 02/04/19 for hypotension and respiratory distress (acute respiratory failure/pulmonary edema) requiring intubation 3/10-current (time of PT eval 02/11/19).  She was visiting family from [place] and COVID 19 tests were negative.  She was found to have group A strep bacteremia (septic shiock), rhinovirus,  and course complicated by NSTEMI and new LV dysfunction s/p cardiac cath on 02/09/19.  Cardiology following.  Other dx include ischemic cardiomyopathy, hyperkalemia, DM2- uncontrolled, and abdominal distention.  Pt with significant PMH of DM, HTN, anc CAD.    PT Comments    Patient seen this AM, per RN limited to bed level therex as she is attempting to wean today. Focused on BLE exercises, will cont to to follow and progress as able.     Follow Up Recommendations  Supervision for mobility/OOB     Equipment Recommendations  Rolling walker with 5" wheels;Wheelchair (measurements PT);Wheelchair cushion (measurements PT);3in1 (PT)    Recommendations for Other Services       Precautions / Restrictions Restrictions Weight Bearing Restrictions: No    Mobility  Bed Mobility               General bed mobility comments: limited to bed level per RN today  Transfers                    Ambulation/Gait                 Stairs             Wheelchair Mobility    Modified Rankin (Stroke Patients Only)       Balance Overall balance assessment: Needs assistance Sitting-balance support: Bilateral upper extremity supported;Feet supported Sitting balance-Leahy Scale: Poor                                      Cognition Arousal/Alertness: Awake/alert Behavior During Therapy: WFL for  tasks assessed/performed Overall Cognitive Status: Difficult to assess                                 General Comments: following commands       Exercises  ankle pumps, quad sets, hip abduction/adduction, straight leg raise, short arc quad. x10 each     General Comments        Pertinent Vitals/Pain Pain Assessment: No/denies pain    Home Living Family/patient expects to be discharged to:: Private residence                    Prior Function            PT Goals (current goals can now be found in the care plan section) Acute Rehab PT Goals Patient Stated Goal: unable to state ETT PT Goal Formulation: Patient unable to participate in goal setting Time For Goal Achievement: 02/25/19 Potential to Achieve Goals: Good Progress towards PT goals: Progressing toward goals    Frequency    Min 3X/week      PT Plan Current plan remains appropriate    Co-evaluation              AM-PAC PT "6 Clicks"  Mobility   Outcome Measure  Help needed turning from your back to your side while in a flat bed without using bedrails?: A Lot Help needed moving from lying on your back to sitting on the side of a flat bed without using bedrails?: A Lot Help needed moving to and from a bed to a chair (including a wheelchair)?: Total Help needed standing up from a chair using your arms (e.g., wheelchair or bedside chair)?: Total Help needed to walk in hospital room?: Total Help needed climbing 3-5 steps with a railing? : Total 6 Click Score: 8    End of Session Equipment Utilized During Treatment: Oxygen Activity Tolerance: Patient limited by fatigue Patient left: in bed;with call bell/phone within reach;with restraints reapplied;with nursing/sitter in room Nurse Communication: Mobility status PT Visit Diagnosis: Muscle weakness (generalized) (M62.81);Difficulty in walking, not elsewhere classified (R26.2)     Time: 2683-4196 PT Time Calculation (min) (ACUTE  ONLY): 17 min  Charges:  $Therapeutic Exercise: 8-22 mins                     Etta Grandchild, PT, DPT Acute Rehabilitation Services Pager: [phone] Office: [phone]     Etta Grandchild 02/13/2019, 1:26 PM

## 2019-02-13 NOTE — Progress Notes (Signed)
NAME:  Gabrielle Wright, MRN:  [id number], DOB:  01-17-1946, LOS: 9 ADMISSION DATE:  02/03/2019, CONSULTATION DATE: 02/04/2019 REFERRING MD: Emergency department physician cHIEF COMPLAINT: Fever respiratory failure  Brief History   73 year old elderly woman visiting from [place], admitted 3/10 with fevers, hypotension, found to have group A strep bacteremia, required mechanical ventilation Course complicated by non-STEMI and new LV dysfunction   Past Medical History  Hypertension Coronary artery disease Suspected diabetes review of her arterial consult receiving correct.  Significant Hospital IsEvents   02/04/2019 transfer from [facility] to [facility] required intubation and pressors. 3/16 off Levophed 3/18 extubated briefly but reintubated due to pulmonary edema  Consults:  02/04/2019 ID   Procedures:  02/04/2019 intubation>> 3/18, 3/18 >> 02/04/2019 CVL>>  Significant Diagnostic Tests:  Echo 3/15 decreased EF 35 to 40%, LVEDP 27, no significant MR Cardiac cath >> LVEDP 40, RCA totally occluded, patent OM1 stent but distally occluded, apical LAD diffusely diseased, no intervention  Micro Data:  02/04/2019 blood cultures x2>> Gr A strep 02/04/2019 sputum culture>> neg 02/04/2019 urine culture>>neg 02/04/2019 respiratory virus panel>> rhinovirus 02/04/2019 flu a and B>> neg Possible coronavirus testing 02/04/2019>>neg   Antimicrobials:  02/04/2019 vancomycin>>3/ 10 02/04/2019 Zosyn>> 3/19 Pen G 3/10 >> 3/22 (plan)  Interim history/subjective:   Developed acute pulmonary edema and was reintubated within 2 hours. Afebrile Good urine output on first shift. Sugars low this morning Sedated on Precedex and fentanyl drip, soft blood pressure  Objective   Blood pressure (!) 103/58, pulse 71, temperature (!) 96.9 F (36.1 C), temperature source Axillary, resp. rate 19, height 5\' 6"  (1.676 m), weight 90 kg, SpO2 100 %.    Vent Mode: PRVC FiO2 (%):  [40 %-100 %] 40 %  Set Rate:  [8 bmp-18 bmp] 18 bmp Vt Set:  [450 mL] 450 mL PEEP:  [5 cmH20-10 cmH20] 5 cmH20 Plateau Pressure:  [21 cmH20-32 cmH20] 24 cmH20   Intake/Output Summary (Last 24 hours) at 02/13/2019 0830 Last data filed at 02/13/2019 0600 Gross per 24 hour  Intake 2075.44 ml  Output 2200 ml  Net -124.56 ml   Filed Weights   02/11/19 0321 02/12/19 0500 02/13/19 0451  Weight: 87.6 kg 88 kg 90 kg   Examination: Elderly woman, intubated, no distress No pallor, icterus RA SS 0, anxious affect, eyes wide open, follows commands Decreased breath sounds bilateral  Soft distended abdomen, nontender S1-S2 regular on monitor Anasarca  X-ray 3/19 personally reviewed which shows pulmonary edema pattern, ET tube in position  Resolved Hospital Problem list     Assessment & Plan:  Septic shock/group A strep bacteremia - Viral testing  negative for COVID, positive for rhino  -Off pressors, continue penicillin until 3/22  Acute respiratory failure/acute pulmonary edema - SBTs but will need optimization from a cardiac standpoint before attempting extubation again -Continue Precedex with RA SS goal 0  -ct  diuresis  AKI  -due to sepsis/ARB , improving Hyperkalemia - resolved -Lasix 60 every 12  Ischemic cardiomyopathy/LVEDP on cath with no targets for revascularization -  ASA + Plavix -Unfortunately blood pressure too low to allow for ARB or hydralazine/nitrates -?  Dig,?  Inotropes to enable better diuresis -Check co-ox  Diabetes type 2/uncontrolled hyperglycemia -Was on high dose of 70/30 at home -Needed Levemir  65 twice daily but n.p.o. 3/18 causing drop in sugars, will  hold morning dose SSI resistant ,ct tube feed coverage -Use insulin drip only if CBG more than 400  Abdominal distention/ Ileus -improved  Minimize  fentanyl Ct  tube feeds    Daughter updated  Daily, difficult situation here she goes into pulmonary edema on extubation no evidence of MR this just seems to be  severe LV dysfunction, need cardiology input on what else we can do to optimize cardiac function  The patient is critically ill with multiple organ systems failure and requires high complexity decision making for assessment and support, frequent evaluation and titration of therapies, application of advanced monitoring technologies and extensive interpretation of multiple databases. Critical Care Time devoted to patient care services described in this note independent of APP/resident  time is 35 minutes.    Cyril Mourning MD. Tonny Bollman. [place] Pager [phone] If no response call [phone]   02/13/2019

## 2019-02-13 NOTE — Significant Event (Signed)
OVERNIGHT COVERAGE CRITICAL CARE PROGRESS NOTE  Called to see patient regarding respiratory distress.  Patient is intubated on Precedex and fentanyl at maximal doses for sedation.  Nurse reports that the patient has been awake and rattling the bed rails, causing confirmed concern that the patient was demonstrating new anxiety (inconsistent with her mental status during last night's care).  Tachycardic.  Cardiac monitor showed heart rate 145.  SBP 160.  She is on milrinone infusion.  SPO2 94%.  Vent settings: PRVC 18/450/50/5.  On auscultation, the patient has diffuse bilateral coarse breath sounds with Rales and rhonchi.  On suctioning, copious amounts of thick, blood tinged sputum.  With suctioning, dramatic improvement in airway pressures is noted.  ASSESSMENT: Respiratory distress  Versed 2 mg IV single dose resulted in improvement.  Heart rate 89, blood pressure 108/50.  SPO2 96%.  Repeat suctioning.  Check BNP, troponin.  Stop Precedex. Start propofol.  Wean fentanyl infusion, as tolerated.  Titrate for RASS-2.  Critical care time: 30 minutes.  The treatment and management of the patient's condition was required based on the threat of imminent deterioration. This time reflects time spent by the physician evaluating, providing care and managing the critically ill patient's care. The time was spent at the immediate bedside (or on the same floor/unit and dedicated to this patient's care). Time involved in separately billable procedures is NOT included int he critical care time indicated above. Family meeting and update time may be included above if and only if the patient is unable/incompetent to participate in clinical interview and/or decision making, and the discussion was necessary to determining treatment decisions.   Marcelle Smiling, MD Board Certified by the ABIM, Pulmonary Diseases & Critical Care Medicine

## 2019-02-13 NOTE — Progress Notes (Signed)
OT Cancellation Note  Patient Details Name: Gabrielle Wright MRN: [id number] DOB: Apr 02, 1946   Cancelled Treatment:    Reason Eval/Treat Not Completed: Medical issues which prohibited therapy(Per RN, pt hypotensive and wanting pt to be seen tomorrow.)   Revonda Standard Cecil Cranker) Glendell Docker OTR/L Acute Rehabilitation Services Pager: [phone] Office: [phone]   Sandrea Hughs 02/13/2019, 12:50 PM

## 2019-02-14 ENCOUNTER — Inpatient Hospital Stay (HOSPITAL_COMMUNITY): Payer: Medicaid Other

## 2019-02-14 LAB — POCT I-STAT 7, (LYTES, BLD GAS, ICA,H+H)
Acid-Base Excess: 1 mmol/L (ref 0.0–2.0)
Bicarbonate: 25.6 mmol/L (ref 20.0–28.0)
Calcium, Ion: 1.17 mmol/L (ref 1.15–1.40)
HCT: 27 % — ABNORMAL LOW (ref 36.0–46.0)
Hemoglobin: 9.2 g/dL — ABNORMAL LOW (ref 12.0–15.0)
O2 Saturation: 96 %
Patient temperature: 98.6
Potassium: 3.8 mmol/L (ref 3.5–5.1)
SODIUM: 137 mmol/L (ref 135–145)
TCO2: 27 mmol/L (ref 22–32)
pCO2 arterial: 39 mmHg (ref 32.0–48.0)
pH, Arterial: 7.425 (ref 7.350–7.450)
pO2, Arterial: 82 mmHg — ABNORMAL LOW (ref 83.0–108.0)

## 2019-02-14 LAB — GLUCOSE, CAPILLARY
Glucose-Capillary: 191 mg/dL — ABNORMAL HIGH (ref 70–99)
Glucose-Capillary: 210 mg/dL — ABNORMAL HIGH (ref 70–99)
Glucose-Capillary: 214 mg/dL — ABNORMAL HIGH (ref 70–99)
Glucose-Capillary: 231 mg/dL — ABNORMAL HIGH (ref 70–99)
Glucose-Capillary: 247 mg/dL — ABNORMAL HIGH (ref 70–99)
Glucose-Capillary: 272 mg/dL — ABNORMAL HIGH (ref 70–99)

## 2019-02-14 LAB — BASIC METABOLIC PANEL
Anion gap: 14 (ref 5–15)
BUN: 75 mg/dL — ABNORMAL HIGH (ref 8–23)
CO2: 24 mmol/L (ref 22–32)
Calcium: 8.5 mg/dL — ABNORMAL LOW (ref 8.9–10.3)
Chloride: 99 mmol/L (ref 98–111)
Creatinine, Ser: 1.57 mg/dL — ABNORMAL HIGH (ref 0.44–1.00)
GFR calc Af Amer: 38 mL/min — ABNORMAL LOW (ref >60)
GFR calc non Af Amer: 33 mL/min — ABNORMAL LOW (ref >60)
Glucose, Bld: 266 mg/dL — ABNORMAL HIGH (ref 70–99)
Potassium: 3.4 mmol/L — ABNORMAL LOW (ref 3.5–5.1)
Sodium: 137 mmol/L (ref 135–145)

## 2019-02-14 LAB — CBC
HEMATOCRIT: 26.3 % — AB (ref 36.0–46.0)
Hemoglobin: 7.8 g/dL — ABNORMAL LOW (ref 12.0–15.0)
MCH: 24.3 pg — ABNORMAL LOW (ref 26.0–34.0)
MCHC: 29.7 g/dL — ABNORMAL LOW (ref 30.0–36.0)
MCV: 81.9 fL (ref 80.0–100.0)
NRBC: 0.4 % — AB (ref 0.0–0.2)
Platelets: 349 10*3/uL (ref 150–400)
RBC: 3.21 MIL/uL — ABNORMAL LOW (ref 3.87–5.11)
RDW: 19 % — ABNORMAL HIGH (ref 11.5–15.5)
WBC: 19.1 10*3/uL — ABNORMAL HIGH (ref 4.0–10.5)

## 2019-02-14 LAB — COOXEMETRY PANEL
Carboxyhemoglobin: 1.5 % (ref 0.5–1.5)
Methemoglobin: 1.3 % (ref 0.0–1.5)
O2 Saturation: 62.3 %
TOTAL HEMOGLOBIN: 8.4 g/dL — AB (ref 12.0–16.0)

## 2019-02-14 LAB — BRAIN NATRIURETIC PEPTIDE: B Natriuretic Peptide: 807.3 pg/mL — ABNORMAL HIGH (ref 0.0–100.0)

## 2019-02-14 LAB — TROPONIN I: Troponin I: 1.96 ng/mL (ref <0.03)

## 2019-02-14 LAB — PHOSPHORUS: Phosphorus: 4.8 mg/dL — ABNORMAL HIGH (ref 2.5–4.6)

## 2019-02-14 LAB — MAGNESIUM: Magnesium: 2.5 mg/dL — ABNORMAL HIGH (ref 1.7–2.4)

## 2019-02-14 LAB — TRIGLYCERIDES: Triglycerides: 179 mg/dL — ABNORMAL HIGH (ref <150)

## 2019-02-14 MED ORDER — POTASSIUM CHLORIDE 20 MEQ/15ML (10%) PO SOLN: 40.0000 meq | Freq: Once | ORAL | Status: DC

## 2019-02-14 MED ORDER — POTASSIUM CHLORIDE 20 MEQ/15ML (10%) PO SOLN
10.0000 meq | Freq: Once | ORAL | Status: AC
  Administered 2019-02-14: 10 meq via ORAL
  Filled 2019-02-14: qty 15

## 2019-02-14 MED ORDER — INSULIN ASPART 100 UNIT/ML ~~LOC~~ SOLN
8.0000 [IU] | Freq: Four times a day (QID) | SUBCUTANEOUS | Status: DC
  Administered 2019-02-14 – 2019-02-17 (×13): 8 [IU] via SUBCUTANEOUS

## 2019-02-14 MED ORDER — POTASSIUM CHLORIDE 20 MEQ/15ML (10%) PO SOLN
20.0000 meq | ORAL | Status: DC
  Administered 2019-02-14: 20 meq
  Filled 2019-02-14: qty 15

## 2019-02-14 MED ORDER — METOPROLOL TARTRATE 5 MG/5ML IV SOLN
5.0000 mg | Freq: Once | INTRAVENOUS | Status: AC
  Administered 2019-02-14: 5 mg via INTRAVENOUS

## 2019-02-14 MED ORDER — POTASSIUM CHLORIDE 20 MEQ/15ML (10%) PO SOLN: 10.0000 meq | Freq: Once | ORAL | Status: DC

## 2019-02-14 MED ORDER — AMIODARONE HCL 200 MG PO TABS
400.0000 mg | ORAL_TABLET | Freq: Two times a day (BID) | ORAL | Status: DC
  Administered 2019-02-14 – 2019-02-25 (×24): 400 mg
  Filled 2019-02-14 (×27): qty 2

## 2019-02-14 MED ORDER — AMIODARONE HCL 200 MG PO TABS
400.0000 mg | ORAL_TABLET | Freq: Two times a day (BID) | ORAL | Status: DC
  Filled 2019-02-14: qty 2

## 2019-02-14 MED ORDER — AMIODARONE HCL IN DEXTROSE 360-4.14 MG/200ML-% IV SOLN
30.0000 mg/h | INTRAVENOUS | Status: DC
  Filled 2019-02-14: qty 200

## 2019-02-14 MED ORDER — AMIODARONE HCL 200 MG PO TABS: 400.0000 mg | ORAL_TABLET | Freq: Two times a day (BID) | ORAL | Status: DC

## 2019-02-14 MED ORDER — POTASSIUM CHLORIDE 20 MEQ/15ML (10%) PO SOLN
20.0000 meq | Freq: Once | ORAL | Status: AC
  Administered 2019-02-14: 20 meq via ORAL
  Filled 2019-02-14: qty 15

## 2019-02-14 MED ORDER — FENTANYL CITRATE (PF) 2500 MCG/50ML IJ SOLN
25.0000 ug/h | Status: DC
  Administered 2019-02-14: 200 ug/h via INTRAVENOUS
  Administered 2019-02-15: 300 ug/h via INTRAVENOUS
  Administered 2019-02-15: 100 ug/h via INTRAVENOUS
  Administered 2019-02-18: 50 ug/h via INTRAVENOUS
  Administered 2019-02-19: 150 ug/h via INTRAVENOUS
  Administered 2019-02-20 – 2019-02-21 (×2): 200 ug/h via INTRAVENOUS
  Administered 2019-02-22: 150 ug/h via INTRAVENOUS
  Administered 2019-02-23: 75 ug/h via INTRAVENOUS
  Administered 2019-02-24: 400 ug/h via INTRAVENOUS
  Filled 2019-02-14 (×3): qty 100
  Filled 2019-02-14: qty 50
  Filled 2019-02-14 (×6): qty 100

## 2019-02-14 MED ORDER — POTASSIUM CHLORIDE 10 MEQ/50ML IV SOLN
10.0000 meq | INTRAVENOUS | Status: DC
  Administered 2019-02-14: 10 meq via INTRAVENOUS
  Filled 2019-02-14: qty 50

## 2019-02-14 MED ORDER — INSULIN DETEMIR 100 UNIT/ML ~~LOC~~ SOLN
50.0000 [IU] | Freq: Two times a day (BID) | SUBCUTANEOUS | Status: DC
  Administered 2019-02-14 (×2): 50 [IU] via SUBCUTANEOUS
  Filled 2019-02-14 (×3): qty 0.5

## 2019-02-14 MED ORDER — AMIODARONE IV BOLUS ONLY 150 MG/100ML: 150.0000 mg | Freq: Once | INTRAVENOUS | Status: DC

## 2019-02-14 MED ORDER — AMIODARONE HCL IN DEXTROSE 360-4.14 MG/200ML-% IV SOLN
60.0000 mg/h | INTRAVENOUS | Status: DC
  Administered 2019-02-14: 60 mg/h via INTRAVENOUS
  Filled 2019-02-14: qty 200

## 2019-02-14 MED ORDER — SODIUM CHLORIDE 0.9 % IV SOLN
2.0000 g | INTRAVENOUS | Status: AC
  Administered 2019-02-14 – 2019-02-16 (×3): 2 g via INTRAVENOUS
  Filled 2019-02-14 (×3): qty 20

## 2019-02-14 MED ORDER — AMIODARONE LOAD VIA INFUSION
150.0000 mg | Freq: Once | INTRAVENOUS | Status: AC
  Administered 2019-02-14: 150 mg via INTRAVENOUS
  Filled 2019-02-14: qty 83.34

## 2019-02-14 MED ORDER — METOPROLOL TARTRATE 5 MG/5ML IV SOLN
INTRAVENOUS | Status: AC
  Filled 2019-02-14: qty 5

## 2019-02-14 NOTE — Progress Notes (Signed)
2200  pateint became tachy, 145bpm and diaphoretic with obvious behavior change.  Holding my hand tightly with bulging eyes.  Then systolic jumped to 160.  Retrieved versed and hit green button.  As Loraine Leriche RN came on the video MD jeong appeared on unit and assisted me.  I gave the versed and after 15 minutes, vitals became normal.  Drew labs and suctioned for thick pink tinged secretions from ballard.  Labs revealed elevated trop and BNP.  Called MD Katrinka Blazing in the box to share results.  Have since changed from Dex to Diprovan and reduced fentanyl gtt to 139mcg/min. Diprovan at 10.  No intervention at this time.  Patient is stable, appears comfortable with stable vss.  Hr=104, sbp=148

## 2019-02-14 NOTE — Progress Notes (Signed)
SLP Cancellation Note  Patient Details Name: Gabrielle Wright MRN: [id number] DOB: [birth year]   Cancelled treatment:       Reason Eval/Treat Not Completed: Medical issues which prohibited therapy. Pt remains intubated at this time. SLP will follow up.   Sherisa Gilvin I. Vear Clock, MS, CCC-SLP Acute Rehabilitation Services Office number [phone] Pager [phone]  Scheryl Marten 02/14/2019, 7:52 AM

## 2019-02-14 NOTE — Progress Notes (Signed)
  Amiodarone Drug - Drug Interaction Consult Note  Recommendations: MONITOR POTASSIUM. Amiodarone is metabolized by the cytochrome P450 system and therefore has the potential to cause many drug interactions. Amiodarone has an average plasma half-life of 50 days (range 20 to 100 days).   There is potential for drug interactions to occur several weeks or months after stopping treatment and the onset of drug interactions may be slow after initiating amiodarone.   []  Statins: Increased risk of myopathy. Simvastatin- restrict dose to 20mg  daily. Other statins: counsel patients to report any muscle pain or weakness immediately.  [x]  Anticoagulants: Amiodarone can increase anticoagulant effect. Consider warfarin dose reduction. Patients should be monitored closely and the dose of anticoagulant altered accordingly, remembering that amiodarone levels take several weeks to stabilize. (heparin SQ)  []  Antiepileptics: Amiodarone can increase plasma concentration of phenytoin, the dose should be reduced. Note that small changes in phenytoin dose can result in large changes in levels. Monitor patient and counsel on signs of toxicity.  []  Beta blockers: increased risk of bradycardia, AV block and myocardial depression. Sotalol - avoid concomitant use.  []   Calcium channel blockers (diltiazem and verapamil): increased risk of bradycardia, AV block and myocardial depression.  []   Cyclosporine: Amiodarone increases levels of cyclosporine. Reduced dose of cyclosporine is recommended.  []  Digoxin dose should be halved when amiodarone is started.  [x]  Diuretics: increased risk of cardiotoxicity if hypokalemia occurs. (furosemide)  []  Oral hypoglycemic agents (glyburide, glipizide, glimepiride): increased risk of hypoglycemia. Patient's glucose levels should be monitored closely when initiating amiodarone therapy.   []  Drugs that prolong the QT interval:  Torsades de pointes risk may be increased with  concurrent use - avoid if possible.  Monitor QTc, also keep magnesium/potassium WNL if concurrent therapy can't be avoided. Marland Kitchen Antibiotics: e.g. fluoroquinolones, erythromycin. . Antiarrhythmics: e.g. quinidine, procainamide, disopyramide, sotalol. . Antipsychotics: e.g. phenothiazines, haloperidol.  . Lithium, tricyclic antidepressants, and methadone.  Thank you,  Vernard Gambles, PharmD, BCPS 02/14/2019 6:23 AM

## 2019-02-14 NOTE — Evaluation (Signed)
Occupational Therapy Evaluation Patient Details Name: Gabrielle Wright MRN: [id number] DOB: 1946-09-13 Today's Date: 02/14/2019    History of Present Illness 73 y.o. female admitted on 02/03/19 for fever and cough (to [facility]) and was transferred to [facility] on 02/04/19 for hypotension and respiratory distress (acute respiratory failure/pulmonary edema) requiring intubation 3/10-current (time of PT eval 02/11/19).  She was visiting family from [place] and COVID 19 tests were negative.  She was found to have group A strep bacteremia (septic shiock), rhinovirus,  and course complicated by NSTEMI and new LV dysfunction s/p cardiac cath on 02/09/19.  Cardiology following.  Other dx include ischemic cardiomyopathy, hyperkalemia, DM2- uncontrolled, and abdominal distention.  Pt with significant PMH of DM, HTN, anc CAD.   Clinical Impression   Pt was independent prior to admission. Currently weaning from vent. Evaluation limited to bed level. Pt with significant weakness requiring 2 person assistance for all mobility. She demonstrates fair to poor sitting balance at EOB. Difficult to assess pt's cognition due to ETT, pt also with mild sedation. Daughter in room throughout session and interpreting. Will continue to follow.    Follow Up Recommendations  Supervision/Assistance - 24 hour(post acute rehab)    Equipment Recommendations  3 in 1 bedside commode    Recommendations for Other Services       Precautions / Restrictions Precautions Precautions: Fall;Other (comment) Precaution Comments: ventilated, feeding tube (with ETT), restraints (wrist) Restrictions Weight Bearing Restrictions: No      Mobility Bed Mobility Overal bed mobility: Needs Assistance Bed Mobility: Supine to Sit;Sit to Supine     Supine to sit: +2 for physical assistance;Total assist Sit to supine: +2 for physical assistance;Total assist   General bed mobility comments: assist for all aspects using helicopter  technique  Transfers                 General transfer comment: deferred    Balance Overall balance assessment: Needs assistance Sitting-balance support: Bilateral upper extremity supported;Feet supported Sitting balance-Leahy Scale: Poor Sitting balance - Comments: min guard briefly to max assist Postural control: Posterior lean                                 ADL either performed or assessed with clinical judgement   ADL                                         General ADL Comments: requires total assist     Vision   Additional Comments: difficult to assess vision     Perception     Praxis      Pertinent Vitals/Pain Pain Assessment: Faces Faces Pain Scale: No hurt     Hand Dominance Right   Extremity/Trunk Assessment Upper Extremity Assessment Upper Extremity Assessment: RUE deficits/detail;LUE deficits/detail RUE Deficits / Details: 2+/5 RUE Coordination: decreased fine motor;decreased gross motor LUE Deficits / Details: 3/5 LUE Coordination: decreased fine motor;decreased gross motor   Lower Extremity Assessment Lower Extremity Assessment: Defer to PT evaluation   Cervical / Trunk Assessment Cervical / Trunk Assessment: Other exceptions Cervical / Trunk Exceptions: weakness   Communication Communication Communication: Prefers language other than English(Bengali)   Cognition Arousal/Alertness: Awake/alert Behavior During Therapy: Flat affect Overall Cognitive Status: Difficult to assess  General Comments: answering yes/no questions inconsistently, slow processing speed, following commands inconsistently with increased time   General Comments       Exercises     Shoulder Instructions      Home Living Family/patient expects to be discharged to:: Private residence Living Arrangements: Children;Other relatives Available Help at Discharge: Family;Available 24  hours/day                                    Prior Functioning/Environment Level of Independence: Independent        Comments: pt visits the [place] 7 months out of the year, was at her daughter's x 10 days when she became ill        OT Problem List: Decreased strength;Decreased activity tolerance;Impaired balance (sitting and/or standing);Decreased coordination;Decreased cognition;Cardiopulmonary status limiting activity;Obesity;Impaired UE functional use      OT Treatment/Interventions: Self-care/ADL training;DME and/or AE instruction;Patient/family education;Balance training;Therapeutic activities;Cognitive remediation/compensation;Therapeutic exercise    OT Goals(Current goals can be found in the care plan section) Acute Rehab OT Goals Patient Stated Goal: daughter hopes pt returns to baseline OT Goal Formulation: With family Time For Goal Achievement: 02/28/19 Potential to Achieve Goals: Good ADL Goals Pt Will Perform Grooming: (P) with mod assist;sitting Pt/caregiver will Perform Home Exercise Program: (P) Increased strength;Both right and left upper extremity;With minimal assist Additional ADL Goal #1: (P) Pt will demonstrate fair sitting balance at EOB x 10 minutes. Additional ADL Goal #2: (P) Pt will perform bed mobility with moderate assist in preparation for ADL. Additional ADL Goal #3: (P) Pt will follow one step commands withing 5 seconds of request 75% of time.  OT Frequency: Min 2X/week   Barriers to D/C:            Co-evaluation              AM-PAC OT "6 Clicks" Daily Activity     Outcome Measure Help from another person eating meals?: Total Help from another person taking care of personal grooming?: Total Help from another person toileting, which includes using toliet, bedpan, or urinal?: Total Help from another person bathing (including washing, rinsing, drying)?: Total Help from another person to put on and taking off regular upper body  clothing?: Total Help from another person to put on and taking off regular lower body clothing?: Total 6 Click Score: 6   End of Session Nurse Communication: Mobility status  Activity Tolerance: Patient tolerated treatment well(VSS) Patient left: in bed;with call bell/phone within reach;with family/visitor present  OT Visit Diagnosis: Muscle weakness (generalized) (M62.81);Other symptoms and signs involving cognitive function                Time: 1322-1413 OT Time Calculation (min): 51 min Charges:  OT General Charges $OT Visit: 1 Visit OT Evaluation $OT Eval High Complexity: 1 High OT Treatments $Therapeutic Activity: 23-37 mins  Martie Round, OTR/L Acute Rehabilitation Services Pager: [phone] Office: [phone]  Gabrielle Wright 02/14/2019, 2:50 PM

## 2019-02-14 NOTE — Progress Notes (Signed)
eLink Physician-Brief Progress Note Patient Name: Leander Favret DOB: 1946/07/19 MRN: [id number]   Date of Service  02/14/2019  HPI/Events of Note  Atrial fib with RVR.  HR as high as 170.  syst bp 135  eICU Interventions  Give lopressor 5 mg IV x 1 Levophed which was at low dose has been stopped     Intervention Category Major Interventions: Arrhythmia - evaluation and management  Henry Russel, P 02/14/2019, 5:48 AM

## 2019-02-14 NOTE — Progress Notes (Addendum)
Progress Note  Patient Name: Gabrielle Wright Date of Encounter: 02/14/2019  Primary Cardiologist:   Minus Breeding, MD   Subjective   Awake on the vent, does not speak English  Inpatient Medications    Scheduled Meds: . aspirin  81 mg Oral Daily  . chlorhexidine gluconate (MEDLINE KIT)  15 mL Mouth Rinse BID  . Chlorhexidine Gluconate Cloth  6 each Topical Q0600  . clopidogrel  75 mg Oral Daily  . feeding supplement (PRO-STAT SUGAR FREE 64)  30 mL Per Tube TID  . feeding supplement (VITAL AF 1.2 CAL)  1,000 mL Per Tube Q24H  . fentaNYL (SUBLIMAZE) injection  50 mcg Intravenous Once  . furosemide  80 mg Intravenous Q12H  . heparin  5,000 Units Subcutaneous Q8H  . insulin aspart  0-24 Units Subcutaneous Q4H  . insulin aspart  5 Units Subcutaneous Q4H  . insulin detemir  45 Units Subcutaneous BID  . mouth rinse  15 mL Mouth Rinse 10 times per day  . metoprolol tartrate      . multivitamin  15 mL Per Tube Daily  . pantoprazole (PROTONIX) IV  40 mg Intravenous QHS  . polyethylene glycol  17 g Oral Daily  . sodium chloride flush  10-40 mL Intracatheter Q12H   Continuous Infusions: . sodium chloride 10 mL/hr at 02/14/19 0400  . amiodarone 60 mg/hr (02/14/19 0720)   Followed by  . amiodarone    . dexmedetomidine (PRECEDEX) IV infusion Stopped (02/14/19 0030)  . fentaNYL infusion INTRAVENOUS 200 mcg/hr (02/14/19 0400)  . milrinone 0.375 mcg/kg/min (02/14/19 0400)  . norepinephrine (LEVOPHED) Adult infusion 2 mcg/min (02/14/19 0400)  . penicillin g continuous IV infusion 8 Million Units (02/14/19 0428)  . potassium chloride 10 mEq (02/14/19 0749)  . propofol (DIPRIVAN) infusion 40 mcg/kg/min (02/14/19 0400)   PRN Meds: acetaminophen (TYLENOL) oral liquid 160 mg/5 mL, acetaminophen, dextrose, fentaNYL, fentaNYL (SUBLIMAZE) injection, fentaNYL (SUBLIMAZE) injection, hydrALAZINE, ipratropium-albuterol, midazolam, ondansetron (ZOFRAN) IV, sodium chloride flush   Vital Signs     Vitals:   02/14/19 0400 02/14/19 0500 02/14/19 0700 02/14/19 0758  BP: (!) 102/48   (!) 113/56  Pulse: 86   99  Resp:    19  Temp:   98.2 F (36.8 C)   TempSrc:   Oral   SpO2: 97%   100%  Weight:  89 kg    Height:        Intake/Output Summary (Last 24 hours) at 02/14/2019 0803 Last data filed at 02/14/2019 0400 Gross per 24 hour  Intake 2809.84 ml  Output 2920 ml  Net -110.16 ml   Filed Weights   02/12/19 0500 02/13/19 0451 02/14/19 0500  Weight: 88 kg 90 kg 89 kg    Telemetry    SR, ST w/ episode of rapid Afib this am, lasted approx 2 hr - Personally Reviewed  ECG    NA - Personally Reviewed  Physical Exam   General: Well developed, well nourished, female in no acute distress Head: Eyes PERRLA, No xanthomas.   Normocephalic and atraumatic Lungs: rales bilaterally to auscultation. Heart: HRRR S1 S2, without MRG.  Pulses are 2+ & equal. No JVD. Abdomen: Bowel sounds are present, abdomen soft and non-tender without masses or  hernias noted. Extremities: No clubbing, cyanosis or edema.    Skin:  No rashes or lesions noted. Neuro: Awake and alert on the vent.  Labs    Chemistry Recent Labs  Lab 02/12/19 0406  02/13/19 0502 02/14/19 0226 02/14/19 0517  NA  136   < > 138 137 137  K 4.0   < > 4.3 3.8 3.4*  CL 104  --  101  --  99  CO2 24  --  25  --  24  GLUCOSE 310*  --  82  --  266*  BUN 82*  --  79*  --  75*  CREATININE 1.68*  --  1.59*  --  1.57*  CALCIUM 8.0*  --  8.3*  --  8.5*  GFRNONAA 30*  --  32*  --  33*  GFRAA 35*  --  37*  --  38*  ANIONGAP 8  --  12  --  14   < > = values in this interval not displayed.     Hematology Recent Labs  Lab 02/12/19 0406  02/13/19 0502 02/14/19 0226 02/14/19 0517  WBC 16.3*  --  20.6*  --  19.1*  RBC 3.31*  --  3.35*  --  3.21*  HGB 7.7*   < > 8.3* 9.2* 7.8*  HCT 26.7*   < > 27.5* 27.0* 26.3*  MCV 80.7  --  82.1  --  81.9  MCH 23.3*  --  24.8*  --  24.3*  MCHC 28.8*  --  30.2  --  29.7*  RDW 18.4*  --   19.0*  --  19.0*  PLT 248  --  301  --  349   < > = values in this interval not displayed.    Cardiac Enzymes Recent Labs  Lab 02/09/19 1215 02/09/19 1734 02/09/19 2306 02/14/19 0015  TROPONINI 9.14* 10.62* 9.58* 1.96*   No results for input(s): TROPIPOC in the last 168 hours.   BNP Recent Labs  Lab 02/14/19 0015  BNP 807.3*      Radiology    Dg Chest Port 1 View  Result Date: 02/14/2019 CLINICAL DATA:  Acute respiratory failure EXAM: PORTABLE CHEST 1 VIEW COMPARISON:  Yesterday FINDINGS: Endotracheal tube tip at the clavicular heads. Left subclavian line with tip at the right atrium. The orogastric tube reaches the stomach. Bilateral interstitial coarsening and hazy lung opacity. No pneumothorax. Normal heart size. IMPRESSION: Stable hardware positioning, pulmonary edema, and layering effusions. Electronically Signed   By: Monte Fantasia M.D.   On: 02/14/2019 05:24   Dg Chest Port 1 View  Result Date: 02/13/2019 CLINICAL DATA:  Respiratory distress EXAM: PORTABLE CHEST 1 VIEW COMPARISON:  02/13/2019, 02/12/2019, 02/11/2019 FINDINGS: Endotracheal tube tip is about 19 mm superior to carina. Esophageal tube tip is below the diaphragm but non included. Left-sided central venous catheter tip over the mid right atrium. Overall no significant interval change in bilateral pleural effusions and diffuse airspace disease. Stable enlarged cardiomediastinal silhouette. Aortic atherosclerosis. IMPRESSION: 1. Endotracheal tube tip about 19 mm superior to carina. Left-sided central venous catheter tip over the right atrium 2. Overall no significant interval change in enlarged cardiomediastinal silhouette, diffuse and fairly extensive bilateral pulmonary edema or infiltrates and bilateral pleural effusions. Electronically Signed   By: Donavan Foil M.D.   On: 02/13/2019 23:56   Portable Chest Xray  Result Date: 02/13/2019 CLINICAL DATA:  Intubation. EXAM: PORTABLE CHEST 1 VIEW COMPARISON:   02/12/2019. FINDINGS: Endotracheal tube, NG tube, left subclavian line noted stable position. Heart size stable. Diffuse bilateral pulmonary infiltrates/edema again noted. Similar findings noted on prior exam. Small bilateral pleural effusions noted on today's exam. No pneumothorax. IMPRESSION: 1.  Lines and tubes in stable position. 2. Diffuse bilateral pulmonary infiltrates/edema again noted. Similar  findings noted on prior exam. Small bilateral pleural effusions noted on today's exam. Electronically Signed   By: Marcello Moores  Register   On: 02/13/2019 06:28   Portable Chest X-ray  Result Date: 02/12/2019 CLINICAL DATA:  73 year old female with respiratory failure. Intubated, enteric tube placement. EXAM: PORTABLE CHEST 1 VIEW COMPARISON:  0540 hours today. FINDINGS: AP view at 1259 hours. Endotracheal tube tip in good position between the clavicles and carina. Enteric tube courses to the abdomen, side hole at the level of the gastric fundus. Stable left subclavian central line. Progressed from this morning bilateral interstitial and indistinct widespread pulmonary opacity. This resembles the appearance on 02/11/2019. No superimposed pneumothorax. No definite pleural effusion. Continued retrocardiac opacity which may be atelectasis. IMPRESSION: 1. Stable lines and tubes. 2. Progressed bilateral interstitial and indistinct pulmonary opacity since this morning favored to be acute pulmonary edema. Electronically Signed   By: Genevie Ann M.D.   On: 02/12/2019 13:29    Cardiac Studies   Echo: IMPRESSIONS 1. The left ventricle has moderately reduced systolic function, with an ejection fraction of 35-40%. The cavity size was normal. Left ventricular diastolic Doppler parameters are consistent with pseudonormalization. Elevated left ventricular  end-diastolic pressure The E/e' is 70. Regional wall motion abnormalities (see coded diagram below). 2. The right ventricle has mildly reduced systolic function. The cavity  was normal. There is no increase in right ventricular wall thickness. 3. No hemodynamically significant valvular heart disease. 4. Normal biatrial chamber size. 5. IVC not well visualized.  FINDINGS Left Ventricle: The left ventricle has moderately reduced systolic function, with an ejection fraction of 35-40%. The cavity size was normal. There is no increase in left ventricular wall thickness. Left ventricular diastolic Doppler parameters are  consistent with pseudonormalization. Elevated left ventricular end-diastolic pressure The E/e' is 25. The entire apex, mid and apical anterior septum, and mid and apical inferior septum are akinetic. The basal and mid anterior wall, mid inferolateral segment, mid anterolateral segment, and mid inferior segment are hypokinetic. All remaining scored segments are normal.   Cardiac Cath 02/09/2019:     2nd Mrg-3 lesion is 80% stenosed.  Acute Mrg lesion is 90% stenosed.   Less than 25% left main  Proximal diffuse 30 to 40% narrowing within the previously placed LAD stent. The entire apical LAD is diffusely diseased with up to 85% stenosis. The first diagonal contains 85% ostial narrowing.  Circumflex contains diffuse proximal to mid eccentric 50% stenosis the dominant second obtuse marginal contains mid vessel 50% stenosis in the mid to distal portion of this obtuse marginal there is a patent stent with 20% narrowing, and just distal to the stent there is an 80% thrombotic stenosis.  The RCA is dominant containing 50% mid vessel stenosis, 70% distal stenosis, 70% ostial PDA stenosis, and total occlusion of the mid vessel that fills by collaterals from the left ventricular apex.  Left ventriculography is not performed. Today's echo was reviewed and there is anteroapical severe hypokinesis and estimated ejection fraction of 40%. LVEDP by Cath Lab hemodynamics is 40 mmHg.      Patient Profile     73 y.o. female with non-STEMI  in the setting of acute viral illness complicated by respiratory failure, pulm edema w/ failed intubation  Assessment & Plan    NSTEMI:   - cath report is above, med mgt - on DAPT - add Lipitor 80 mg thru tube - BP has been too low for BB/ACE/ARB - Discuss adding BB w/ MD, BP is better  on milrinone   ACUTE RESPIRATORY FAILURE.   - group A strep bacteremia - per CCM and ID  ACUTE PULMONARY EDEMA, ACUTE SYSTOLIC CHF:  - LVEDP 40 at cath>>diuresis recommended - Lasix 60 mg IV BID>>80 mg IV BID 03/19 - output 2900 cc yesterday - unfortunately, intake was 2800 cc yesterday, so only slightly net negative - case reviewed w/ pharmacist>>they will look at options to decrease intake - BP better w/ milrinone, but w/ tachycardia and episode Afib, may need to decrease rate  ICM  - not stable enough to add BB/ACE/ARB/Entresto  PAF - New dx, occurred in the setting of acute illness  - unclear if milrinone could have contributed - brief duration so no anticoag needed - now on amio, really need to change to po, discuss w/ MD  For questions or updates, please contact [facility] Please consult [email or web address] for contact info under Cardiology/STEMI.   Signed, Rosaria Ferries, PA-C  02/14/2019, 8:03 AM     Attending Note:   The patient was seen and examined.  Agree with assessment and plan as noted above.  Changes made to the above note as needed.  Patient seen and independently examined with Rosaria Ferries, PA .   We discussed all aspects of the encounter. I agree with the assessment and plan as stated above.  1.   Recent NSTEMI:   Has  Mod- severe  disease with severe disease in the distal vessels .   Has severe CAD involving the distal LAD .   2.   PAF ::  Had an episode of PAF this am.   Converted back to sinus tach almost immediately after the bolus She is getting lots of IVF.   We can change the amio to PO / per NG tube.  At this point, this episode was very brief and was  associated with her acute respiratory illness.   I do not think we need to start anticoagulation ( especially in light of her bloody pulmonary edema )   3.  Acute combined CHF:   EF 35-40 %.   Continue lasix, milrinone for now.     I have spent a total of 40 minutes with patient reviewing hospital  notes , telemetry, EKGs, labs and examining patient as well as establishing an assessment and plan that was discussed with the patient. > 50% of time was spent in direct patient care.    Thayer Headings, Brooke Bonito., MD, [facility] 02/14/2019, 9:16 AM [address]  [place] Pager [phone]

## 2019-02-14 NOTE — Progress Notes (Signed)
RT obtained pts ABG with the following results. RT will continue to monitor.   Results for Gabrielle Wright, Gabrielle Wright (MRN [id number]) as of 02/14/2019 03:22  Ref. Range 02/14/2019 02:26  Sample type Unknown ARTERIAL  pH, Arterial Latest Ref Range: 7.350 - 7.450  7.425  pCO2 arterial Latest Ref Range: 32.0 - 48.0 mmHg 39.0  pO2, Arterial Latest Ref Range: 83.0 - 108.0 mmHg 82.0 (L)  TCO2 Latest Ref Range: 22 - 32 mmol/L 27  Acid-Base Excess Latest Ref Range: 0.0 - 2.0 mmol/L 1.0  Bicarbonate Latest Ref Range: 20.0 - 28.0 mmol/L 25.6  O2 Saturation Latest Units: % 96.0  Patient temperature Unknown 98.6 F  Collection site Unknown BRACHIAL ARTERY

## 2019-02-14 NOTE — Progress Notes (Signed)
eLink Physician-Brief Progress Note Patient Name: Gabrielle Wright DOB: Mar 31, 1946 MRN: [id number]   Date of Service  02/14/2019  HPI/Events of Note  Atrial fib with RVR.  Remaining difficult to control  eICU Interventions  Start amiodarone drip     Intervention Category Major Interventions: Arrhythmia - evaluation and management  Henry Russel, P 02/14/2019, 6:14 AM

## 2019-02-14 NOTE — Progress Notes (Signed)
NAME:  Gabrielle Wright, MRN:  [id number], DOB:  05/12/1946, LOS: 10 ADMISSION DATE:  02/03/2019, CONSULTATION DATE: 02/04/2019 REFERRING MD: Emergency department physician cHIEF COMPLAINT: Fever respiratory failure  Brief History   73 year old elderly woman visiting from [place], admitted 3/10 with fevers, hypotension, found to have group A strep bacteremia, required mechanical ventilation Course complicated by non-STEMI and new LV dysfunction ? Ischemic vs Takatsubo's   Past Medical History  Hypertension Coronary artery disease Suspected diabetes review of her arterial consult receiving correct.  Significant Hospital IsEvents   02/04/2019 transfer from [facility] to [facility] required intubation and pressors. 3/16 off Levophed 3/18 extubated briefly but reintubated due to pulmonary edema 3/19 started milrinone  Consults:  02/04/2019 ID   Procedures:  02/04/2019 intubation>> 3/18, 3/18 >> 02/04/2019 CVL>>  Significant Diagnostic Tests:  Echo 3/15 decreased EF 35 to 40%, LVEDP 27, no significant MR Cardiac cath >> LVEDP 40, RCA totally occluded, patent OM1 stent but distally occluded, apical LAD diffusely diseased, no intervention  Micro Data:  02/04/2019 blood cultures x2>> Gr A strep 02/04/2019 sputum culture>> neg 02/04/2019 urine culture>>neg 02/04/2019 respiratory virus panel>> rhinovirus 02/04/2019 flu a and B>> neg Possible coronavirus testing 02/04/2019>>neg   Antimicrobials:  02/04/2019 vancomycin>>3/ 10 02/04/2019 Zosyn>> 3/19 Pen G 3/10 >> 3/22 (plan) ceftx 3/20 >>  Interim history/subjective:   Episode of respiratory distress overnight decreased with increasing sedation,  Precedex changed to propofol Started on milrinone yesterday. Continues to make good urine with Lasix. Atrial fibrillation RVR this morning requiring amiodarone bolus and now back in sinus rhythm. Afebrile Objective   Blood pressure (!) 146/56, pulse (!) 111, temperature 98.2 F  (36.8 C), temperature source Oral, resp. rate 19, height 5\' 6"  (1.676 m), weight 89 kg, SpO2 99 %. CVP:  [13 mmHg-17 mmHg] 15 mmHg  Vent Mode: PRVC FiO2 (%):  [40 %-50 %] 40 % Set Rate:  [18 bmp] 18 bmp Vt Set:  [450 mL] 450 mL PEEP:  [5 cmH20] 5 cmH20 Plateau Pressure:  [15 cmH20-20 cmH20] 15 cmH20   Intake/Output Summary (Last 24 hours) at 02/14/2019 0856 Last data filed at 02/14/2019 0800 Gross per 24 hour  Intake 3093.7 ml  Output 3320 ml  Net -226.3 ml   Filed Weights   02/12/19 0500 02/13/19 0451 02/14/19 0500  Weight: 88 kg 90 kg 89 kg   Examination: Elderly woman, intubated, no distress, sedated on propofol/fentanyl No pallor, icterus RA SS -1, nonspecific agitation, nonfocal Decreased breath sounds bilateral  Soft distended abdomen, nontender S1-S2 regular on monitor Anasarca  Chest x-ray 3/20 personally reviewed by me shows decrease edema  Resolved Hospital Problem list     Assessment & Plan:  Septic shock/group A strep bacteremia - Viral testing  negative for COVID, positive for rhino  -Off pressors, changed from penicillin to ceftriaxone which we will continue till 3/20 due to decrease volume  Acute respiratory failure/acute pulmonary edema - SBTs but will need optimization from a cardiac standpoint before attempting extubation again -ct  diuresis  AKI  -due to sepsis/ARB , improving Hyperkalemia - resolved -Lasix 80 every 12 -Hypokalemia will be repleted -Has excellent urine output 3 L with Lasix but is also getting a lot of volume due to antibiotics and infusions which we will try to minimize  Ischemic cardiomyopathy/LVEDP on cath with no targets for revascularization -  ASA + Plavix -Unfortunately blood pressure too low to allow for ARB or hydralazine/nitrates -Started milrinone and titrated to 0.375 with improvement in co-ox  to 62%   Diabetes type 2/uncontrolled hyperglycemia -Was on high dose of 70/30 at home -Needed Levemir  65 twice daily but  n.p.o. 3/18 causing drop in sugars, now rising again -Change Levemir to 50 twice daily, increase tube feed coverage to 8 units every 6 hours SSI resistant   Abdominal distention/ Ileus -improved Minimize  fentanyl Ct  tube feeds at goal  Acute encephalopathy-intermittent severe agitation, worsened by language barrier -Using propofol, goal RA SS -1 -Minimize fentanyl drip  Daughter updated daily  Summary-acute pulmonary edema due to non-STEMI/cardiomyopathy, achieving good diuresis with Lasix and will limit volume to ensure negative balance, milrinone seems to have helped with improving co-ox and hopefully we can attempt another extubation in the next 24 to 48 hours  The patient is critically ill with multiple organ systems failure and requires high complexity decision making for assessment and support, frequent evaluation and titration of therapies, application of advanced monitoring technologies and extensive interpretation of multiple databases. Critical Care Time devoted to patient care services described in this note independent of APP/resident  time is 35 minutes.       Cyril Mourning MD. Tonny Bollman. [facility] Pager [phone] If no response call [phone]   02/14/2019

## 2019-02-15 ENCOUNTER — Inpatient Hospital Stay (HOSPITAL_COMMUNITY): Payer: Medicaid Other

## 2019-02-15 DIAGNOSIS — I480 Paroxysmal atrial fibrillation: Secondary | ICD-10-CM

## 2019-02-15 LAB — CBC
HCT: 22.8 % — ABNORMAL LOW (ref 36.0–46.0)
HCT: 26.2 % — ABNORMAL LOW (ref 36.0–46.0)
Hemoglobin: 6.6 g/dL — CL (ref 12.0–15.0)
Hemoglobin: 7.5 g/dL — ABNORMAL LOW (ref 12.0–15.0)
MCH: 24.4 pg — ABNORMAL LOW (ref 26.0–34.0)
MCH: 24.1 pg — ABNORMAL LOW (ref 26.0–34.0)
MCHC: 28.9 g/dL — ABNORMAL LOW (ref 30.0–36.0)
MCHC: 28.6 g/dL — ABNORMAL LOW (ref 30.0–36.0)
MCV: 84.1 fL (ref 80.0–100.0)
MCV: 84.2 fL (ref 80.0–100.0)
Platelets: 311 10*3/uL (ref 150–400)
Platelets: 360 10*3/uL (ref 150–400)
RBC: 2.71 MIL/uL — ABNORMAL LOW (ref 3.87–5.11)
RBC: 3.11 MIL/uL — ABNORMAL LOW (ref 3.87–5.11)
RDW: 19.5 % — ABNORMAL HIGH (ref 11.5–15.5)
RDW: 19.5 % — ABNORMAL HIGH (ref 11.5–15.5)
WBC: 15.9 10*3/uL — ABNORMAL HIGH (ref 4.0–10.5)
WBC: 20.6 10*3/uL — ABNORMAL HIGH (ref 4.0–10.5)
nRBC: 0.5 % — ABNORMAL HIGH (ref 0.0–0.2)
nRBC: 0.5 % — ABNORMAL HIGH (ref 0.0–0.2)

## 2019-02-15 LAB — GLUCOSE, CAPILLARY
Glucose-Capillary: 209 mg/dL — ABNORMAL HIGH (ref 70–99)
Glucose-Capillary: 236 mg/dL — ABNORMAL HIGH (ref 70–99)
Glucose-Capillary: 247 mg/dL — ABNORMAL HIGH (ref 70–99)
Glucose-Capillary: 250 mg/dL — ABNORMAL HIGH (ref 70–99)
Glucose-Capillary: 312 mg/dL — ABNORMAL HIGH (ref 70–99)

## 2019-02-15 LAB — COOXEMETRY PANEL
Carboxyhemoglobin: 1.3 % (ref 0.5–1.5)
Methemoglobin: 2 % — ABNORMAL HIGH (ref 0.0–1.5)
O2 Saturation: 54.5 %
Total hemoglobin: 7.4 g/dL — ABNORMAL LOW (ref 12.0–16.0)

## 2019-02-15 LAB — BASIC METABOLIC PANEL
Anion gap: 13 (ref 5–15)
Anion gap: 15 (ref 5–15)
BUN: 76 mg/dL — ABNORMAL HIGH (ref 8–23)
BUN: 76 mg/dL — ABNORMAL HIGH (ref 8–23)
CALCIUM: 8.5 mg/dL — AB (ref 8.9–10.3)
CHLORIDE: 100 mmol/L (ref 98–111)
CO2: 24 mmol/L (ref 22–32)
CO2: 23 mmol/L (ref 22–32)
CREATININE: 1.93 mg/dL — AB (ref 0.44–1.00)
Calcium: 8.8 mg/dL — ABNORMAL LOW (ref 8.9–10.3)
Chloride: 99 mmol/L (ref 98–111)
Creatinine, Ser: 1.88 mg/dL — ABNORMAL HIGH (ref 0.44–1.00)
GFR calc Af Amer: 30 mL/min — ABNORMAL LOW (ref >60)
GFR calc Af Amer: 29 mL/min — ABNORMAL LOW (ref >60)
GFR calc non Af Amer: 25 mL/min — ABNORMAL LOW (ref >60)
GFR, EST NON AFRICAN AMERICAN: 26 mL/min — AB (ref >60)
Glucose, Bld: 229 mg/dL — ABNORMAL HIGH (ref 70–99)
Glucose, Bld: 293 mg/dL — ABNORMAL HIGH (ref 70–99)
Potassium: 3.6 mmol/L (ref 3.5–5.1)
Potassium: 4.7 mmol/L (ref 3.5–5.1)
SODIUM: 138 mmol/L (ref 135–145)
Sodium: 136 mmol/L (ref 135–145)

## 2019-02-15 LAB — PHOSPHORUS: PHOSPHORUS: 5.4 mg/dL — AB (ref 2.5–4.6)

## 2019-02-15 LAB — MAGNESIUM
Magnesium: 2.6 mg/dL — ABNORMAL HIGH (ref 1.7–2.4)
Magnesium: 2.9 mg/dL — ABNORMAL HIGH (ref 1.7–2.4)

## 2019-02-15 MED ORDER — POTASSIUM CHLORIDE 20 MEQ/15ML (10%) PO SOLN
10.0000 meq | Freq: Once | ORAL | Status: AC
  Administered 2019-02-15: 10 meq
  Filled 2019-02-15: qty 15

## 2019-02-15 MED ORDER — AMIODARONE IV BOLUS ONLY 150 MG/100ML
150.0000 mg | Freq: Once | INTRAVENOUS | Status: AC
  Administered 2019-02-16: 150 mg via INTRAVENOUS
  Filled 2019-02-15: qty 100

## 2019-02-15 MED ORDER — AMIODARONE IV BOLUS ONLY 150 MG/100ML
150.0000 mg | Freq: Once | INTRAVENOUS | Status: AC
  Administered 2019-02-15: 150 mg via INTRAVENOUS
  Filled 2019-02-15: qty 100

## 2019-02-15 MED ORDER — INSULIN DETEMIR 100 UNIT/ML ~~LOC~~ SOLN
60.0000 [IU] | Freq: Two times a day (BID) | SUBCUTANEOUS | Status: DC
  Administered 2019-02-15 – 2019-02-21 (×13): 60 [IU] via SUBCUTANEOUS
  Filled 2019-02-15 (×14): qty 0.6

## 2019-02-15 MED ORDER — LACTULOSE 10 GM/15ML PO SOLN
30.0000 g | Freq: Once | ORAL | Status: AC
  Administered 2019-02-15: 30 g
  Filled 2019-02-15: qty 45

## 2019-02-15 NOTE — Progress Notes (Signed)
Elink notified of pts HR 150 SVT. Awaiting new orders  Will continue to monitor.

## 2019-02-15 NOTE — Progress Notes (Signed)
eLink Physician-Brief Progress Note Patient Name: Gabrielle Wright DOB: 1946/11/18 MRN: [id number]   Date of Service  02/15/2019  HPI/Events of Note  Creatinine = 1.88 --> 1.93.  eICU Interventions  Will order: 1. Hold Lasix until patient re-evaluated by rounding team in AM.     Intervention Category Major Interventions: Other:  Lenell Antu 02/15/2019, 8:43 PM

## 2019-02-15 NOTE — Progress Notes (Signed)
CRITICAL VALUE ALERT  Critical Value:  6.6 hgb  Date & Time Notied:  3/21  0450  Provider Notified: Glade Nurse MD via Loraine Leriche, RN  Orders Received/Actions taken: re-draw CBC

## 2019-02-15 NOTE — Progress Notes (Signed)
NAME:  Gabrielle Wright, MRN:  [id number], DOB:  1946-04-02, LOS: 51 ADMISSION DATE:  02/03/2019, CONSULTATION DATE: 02/04/2019 REFERRING MD: Emergency department physician cHIEF COMPLAINT: Fever respiratory failure  Brief History   73 year old elderly woman visiting from [place], admitted 3/10 with fevers, hypotension, found to have group A strep bacteremia, required mechanical ventilation Course complicated by non-STEMI and new LV dysfunction ? Ischemic vs Takatsubo's   Past Medical History  Hypertension Coronary artery disease Suspected diabetes review of her arterial consult receiving correct.  Significant Hospital IsEvents   02/04/2019 transfer from [facility] to [facility] required intubation and pressors. 3/16 off Levophed 3/18 extubated briefly but reintubated due to pulmonary edema 3/19 started milrinone 3/20 Episode of respiratory distress,  Precedex changed to propofol Atrial fibrillation RVR >>amiodarone bolus >> sinus rhythm  Consults:  02/04/2019 ID   Procedures:  02/04/2019 intubation>> 3/18, 3/18 >> 02/04/2019 CVL>>  Significant Diagnostic Tests:  Echo 3/15 decreased EF 35 to 40%, LVEDP 27, no significant MR Cardiac cath >> LVEDP 40, RCA totally occluded, patent OM1 stent but distally occluded, apical LAD diffusely diseased, no intervention  Micro Data:  02/04/2019 blood cultures x2>> Gr A strep 02/04/2019 sputum culture>> neg 02/04/2019 urine culture>>neg 02/04/2019 respiratory virus panel>> rhinovirus 02/04/2019 flu a and B>> neg coronavirus testing 02/04/2019>>neg   Antimicrobials:  02/04/2019 vancomycin>>3/ 10 02/04/2019 Zosyn>> 3/19 Pen G 3/10 >> 3/22 (plan) ceftx 3/20 >>  Interim history/subjective:  Remains critically ill, intubated On milrinone Sedated on propofol and fentanyl No bowel movement last 24 hours Afebrile  Poor response to second dose of Lasix Objective   Blood pressure (!) 108/42, pulse (!) 117, temperature 98.9 F (37.2  C), temperature source Axillary, resp. rate 14, height 5' 6"  (1.676 m), weight 83.2 kg, SpO2 97 %. CVP:  [12 mmHg-14 mmHg] 14 mmHg  Vent Mode: PSV;CPAP FiO2 (%):  [40 %] 40 % Set Rate:  [18 bmp] 18 bmp Vt Set:  [450 mL] 450 mL PEEP:  [5 cmH20] 5 cmH20 Pressure Support:  [5 cmH20] 5 cmH20 Plateau Pressure:  [15 cmH20-23 cmH20] 16 cmH20   Intake/Output Summary (Last 24 hours) at 02/15/2019 0803 Last data filed at 02/15/2019 0600 Gross per 24 hour  Intake 2271.77 ml  Output 2605 ml  Net -333.23 ml   Filed Weights   02/13/19 0451 02/14/19 0500 02/15/19 0439  Weight: 90 kg 89 kg 83.2 kg   Examination: Elderly woman, intubated, no distress, sedated on propofol/fentanyl No pallor, icterus RA SS -1, eyes open, not agitated, follows one-step commands, language barrier Decreased breath sounds bilateral  Soft distended abdomen, nontender S1-S2 regular on monitor Anasarca  X-ray 3/21 personally reviewed by me shows decrease edema, no effusions  Resolved Hospital Problem list     Assessment & Plan:  Septic shock/group A strep bacteremia - Viral testing  negative for COVID, positive for rhino  -Off pressors, changed from penicillin to ceftriaxone, plan was to stop 321 but leukocytosis concerning, will continue until repeat respiratory culture obtained  Acute respiratory failure/acute pulmonary edema - SBTs with goal extubation -Would like to optimize cardiac status better -ct  diuresis  AKI  -due to sepsis/ARB , improving Hyperkalemia - resolved -Lasix 80 every 12, achieving negative balance now but creatinine climbing and not good response to the last dose of Lasix, will recheck be met before next dose -Hypokalemia will be repleted   Ischemic cardiomyopathy/LVEDP on cath with no targets for revascularization -  ASA + Plavix -Unfortunately blood pressure too low  to allow for ARB or hydralazine/nitrates -Continue milrinone  0.375 with monitoring of co-ox, staying low    Diabetes type 2/uncontrolled hyperglycemia -Was on high dose of 70/30 at home -Needed Levemir  65 twice daily but n.p.o. 3/18 causing drop in sugars, now rising again -Increase Levemir back to 60 twice daily,ct  tube feed coverage to 8 units every 6 hours -SSI resistant q 4h  Abdominal distention/ Ileus -improved Minimize  fentanyl Ct  tube feeds at goal Add lactulose  Acute encephalopathy-intermittent severe agitation, worsened by language barrier -Using propofol, goal RA SS -1 -Minimize fentanyl drip  Anemia of critical illness-no evidence of blood loss -1 unit PRBC for hemoglobin of 6.6, goal 8  Daughter updated daily  Summary-acute pulmonary edema due to non-STEMI/cardiomyopathy, achieving good negative balance with Lasix , milrinone seems to have helped , goal is to attempt another extubation in the next 24 to 48 hours once cardiac status optimized   The patient is critically ill with multiple organ systems failure and requires high complexity decision making for assessment and support, frequent evaluation and titration of therapies, application of advanced monitoring technologies and extensive interpretation of multiple databases. Critical Care Time devoted to patient care services described in this note independent of APP/resident  time is 35 minutes.       Kara Mead MD. Shade Flood. [place] [facility] Pager [phone] If no response call [phone]   02/15/2019

## 2019-02-15 NOTE — Progress Notes (Signed)
Progress Note  Patient Name: Gabrielle Wright Date of Encounter: 02/15/2019  Primary Cardiologist:   Minus Breeding, MD   Subjective   Awake on the vent, does not speak English Opens eyes.   Inpatient Medications    Scheduled Meds:  amiodarone  400 mg Per Tube BID   aspirin  81 mg Oral Daily   chlorhexidine gluconate (MEDLINE KIT)  15 mL Mouth Rinse BID   Chlorhexidine Gluconate Cloth  6 each Topical Q0600   clopidogrel  75 mg Oral Daily   feeding supplement (PRO-STAT SUGAR FREE 64)  30 mL Per Tube TID   feeding supplement (VITAL AF 1.2 CAL)  1,000 mL Per Tube Q24H   fentaNYL (SUBLIMAZE) injection  50 mcg Intravenous Once   furosemide  80 mg Intravenous Q12H   heparin  5,000 Units Subcutaneous Q8H   insulin aspart  0-24 Units Subcutaneous Q4H   insulin aspart  8 Units Subcutaneous Q6H   insulin detemir  60 Units Subcutaneous BID   mouth rinse  15 mL Mouth Rinse 10 times per day   multivitamin  15 mL Per Tube Daily   pantoprazole (PROTONIX) IV  40 mg Intravenous QHS   polyethylene glycol  17 g Oral Daily   sodium chloride flush  10-40 mL Intracatheter Q12H   Continuous Infusions:  sodium chloride 10 mL/hr at 02/15/19 1300   amiodarone 150 mg (02/15/19 1350)   cefTRIAXone (ROCEPHIN)  IV Stopped (02/15/19 0944)   fentaNYL infusion INTRAVENOUS 200 mcg/hr (02/15/19 1300)   milrinone 0.375 mcg/kg/min (02/15/19 1347)   norepinephrine (LEVOPHED) Adult infusion Stopped (02/14/19 0545)   propofol (DIPRIVAN) infusion 20 mcg/kg/min (02/15/19 1300)   PRN Meds: acetaminophen (TYLENOL) oral liquid 160 mg/5 mL, acetaminophen, dextrose, fentaNYL, fentaNYL (SUBLIMAZE) injection, fentaNYL (SUBLIMAZE) injection, hydrALAZINE, ipratropium-albuterol, midazolam, ondansetron (ZOFRAN) IV, sodium chloride flush   Vital Signs    Vitals:   02/15/19 1100 02/15/19 1132 02/15/19 1200 02/15/19 1300  BP: 127/68  (!) 146/60 135/64  Pulse: (!) 114  (!) 121 (!) 122  Resp:        Temp:  99.4 F (37.4 C)    TempSrc:  Axillary    SpO2: 97%  97% 99%  Weight:      Height:        Intake/Output Summary (Last 24 hours) at 02/15/2019 1352 Last data filed at 02/15/2019 1300 Gross per 24 hour  Intake 2174.98 ml  Output 2430 ml  Net -255.02 ml   Filed Weights   02/13/19 0451 02/14/19 0500 02/15/19 0439  Weight: 90 kg 89 kg 83.2 kg    Telemetry     Is back in atrial fib at 115 .   ECG       Physical Exam   General:  Elderly female  Head: Eyes PERRLA, No xanthomas.   Normocephalic and atraumatic Lungs: rales bilaterally to auscultation. Heart: RR,  Tachy  Abdomen: Bowel sounds are present, abdomen soft and non-tender without masses or  hernias noted. Extremities: No clubbing, cyanosis or edema.    Skin:  No rashes or lesions noted. Neuro: Awake and alert on the vent.  Labs    Chemistry Recent Labs  Lab 02/13/19 0502 02/14/19 0226 02/14/19 0517 02/15/19 0355  NA 138 137 137 136  K 4.3 3.8 3.4* 3.6  CL 101  --  99 99  CO2 25  --  24 24  GLUCOSE 82  --  266* 229*  BUN 79*  --  75* 76*  CREATININE 1.59*  --  1.57* 1.88*  CALCIUM 8.3*  --  8.5* 8.5*  GFRNONAA 32*  --  33* 26*  GFRAA 37*  --  38* 30*  ANIONGAP 12  --  14 13     Hematology Recent Labs  Lab 02/14/19 0517 02/15/19 0355 02/15/19 0547  WBC 19.1* 15.9* 20.6*  RBC 3.21* 2.71* 3.11*  HGB 7.8* 6.6* 7.5*  HCT 26.3* 22.8* 26.2*  MCV 81.9 84.1 84.2  MCH 24.3* 24.4* 24.1*  MCHC 29.7* 28.9* 28.6*  RDW 19.0* 19.5* 19.5*  PLT 349 311 360    Cardiac Enzymes Recent Labs  Lab 02/09/19 1215 02/09/19 1734 02/09/19 2306 02/14/19 0015  TROPONINI 9.14* 10.62* 9.58* 1.96*   No results for input(s): TROPIPOC in the last 168 hours.   BNP Recent Labs  Lab 02/14/19 0015  BNP 807.3*      Radiology    Dg Chest Port 1 View  Result Date: 02/15/2019 CLINICAL DATA:  Acute respiratory failure EXAM: PORTABLE CHEST 1 VIEW COMPARISON:  02/14/2019 FINDINGS: Endotracheal tube in good  position. Central line in the right atrium unchanged. NG in the stomach unchanged. Diffuse bilateral airspace disease with mild interval improvement. Small effusions with improvement. IMPRESSION: Mild improvement in diffuse bilateral airspace disease and small pleural effusions bilaterally. Endotracheal tube in good position. Central line right atrium. Electronically Signed   By: Franchot Gallo M.D.   On: 02/15/2019 07:51   Dg Chest Port 1 View  Result Date: 02/14/2019 CLINICAL DATA:  Acute respiratory failure EXAM: PORTABLE CHEST 1 VIEW COMPARISON:  Yesterday FINDINGS: Endotracheal tube tip at the clavicular heads. Left subclavian line with tip at the right atrium. The orogastric tube reaches the stomach. Bilateral interstitial coarsening and hazy lung opacity. No pneumothorax. Normal heart size. IMPRESSION: Stable hardware positioning, pulmonary edema, and layering effusions. Electronically Signed   By: Monte Fantasia M.D.   On: 02/14/2019 05:24   Dg Chest Port 1 View  Result Date: 02/13/2019 CLINICAL DATA:  Respiratory distress EXAM: PORTABLE CHEST 1 VIEW COMPARISON:  02/13/2019, 02/12/2019, 02/11/2019 FINDINGS: Endotracheal tube tip is about 19 mm superior to carina. Esophageal tube tip is below the diaphragm but non included. Left-sided central venous catheter tip over the mid right atrium. Overall no significant interval change in bilateral pleural effusions and diffuse airspace disease. Stable enlarged cardiomediastinal silhouette. Aortic atherosclerosis. IMPRESSION: 1. Endotracheal tube tip about 19 mm superior to carina. Left-sided central venous catheter tip over the right atrium 2. Overall no significant interval change in enlarged cardiomediastinal silhouette, diffuse and fairly extensive bilateral pulmonary edema or infiltrates and bilateral pleural effusions. Electronically Signed   By: Donavan Foil M.D.   On: 02/13/2019 23:56    Cardiac Studies   Echo: IMPRESSIONS 1. The left  ventricle has moderately reduced systolic function, with an ejection fraction of 35-40%. The cavity size was normal. Left ventricular diastolic Doppler parameters are consistent with pseudonormalization. Elevated left ventricular  end-diastolic pressure The E/e' is 70. Regional wall motion abnormalities (see coded diagram below). 2. The right ventricle has mildly reduced systolic function. The cavity was normal. There is no increase in right ventricular wall thickness. 3. No hemodynamically significant valvular heart disease. 4. Normal biatrial chamber size. 5. IVC not well visualized.  FINDINGS Left Ventricle: The left ventricle has moderately reduced systolic function, with an ejection fraction of 35-40%. The cavity size was normal. There is no increase in left ventricular wall thickness. Left ventricular diastolic Doppler parameters are  consistent with pseudonormalization. Elevated left ventricular end-diastolic pressure The  E/e' is 64. The entire apex, mid and apical anterior septum, and mid and apical inferior septum are akinetic. The basal and mid anterior wall, mid inferolateral segment, mid anterolateral segment, and mid inferior segment are hypokinetic. All remaining scored segments are normal.   Cardiac Cath 02/09/2019:     2nd Mrg-3 lesion is 80% stenosed.  Acute Mrg lesion is 90% stenosed.   Less than 25% left main  Proximal diffuse 30 to 40% narrowing within the previously placed LAD stent. The entire apical LAD is diffusely diseased with up to 85% stenosis. The first diagonal contains 85% ostial narrowing.  Circumflex contains diffuse proximal to mid eccentric 50% stenosis the dominant second obtuse marginal contains mid vessel 50% stenosis in the mid to distal portion of this obtuse marginal there is a patent stent with 20% narrowing, and just distal to the stent there is an 80% thrombotic stenosis.  The RCA is dominant containing 50% mid vessel  stenosis, 70% distal stenosis, 70% ostial PDA stenosis, and total occlusion of the mid vessel that fills by collaterals from the left ventricular apex.  Left ventriculography is not performed. Today's echo was reviewed and there is anteroapical severe hypokinesis and estimated ejection fraction of 40%. LVEDP by Cath Lab hemodynamics is 40 mmHg.      Patient Profile     73 y.o. female with non-STEMI in the setting of acute viral illness complicated by respiratory failure, pulm edema w/ failed intubation  Assessment & Plan       The patient was seen and examined.  Agree with assessment and plan as noted above.  Changes made to the above note as needed.  Patient seen and independently examined with Rosaria Ferries, PA .   We discussed all aspects of the encounter. I agree with the assessment and plan as stated above.  1.   Recent NSTEMI:    Has mod- severe CAD.   No evidence of ischemia presently  .   2.   PAF:  Has gone back into atrial fib with RVR.   amio bolus has been restarted continue PO amio  3.  Acute combined CHF:   EF 35-40.  Continue lasix and milrinone for now     I have spent a total of 40 minutes with patient reviewing hospital  notes , telemetry, EKGs, labs and examining patient as well as establishing an assessment and plan that was discussed with the patient. > 50% of time was spent in direct patient care.    Thayer Headings, Brooke Bonito., MD, [facility] 02/15/2019, 1:52 PM [address],  [place] Pager [phone]

## 2019-02-15 NOTE — Progress Notes (Signed)
eLink Physician-Brief Progress Note Patient Name: Gabrielle Wright DOB: 02-25-1946 MRN: [id number]   Date of Service  02/15/2019  HPI/Events of Note  AFIB with RVR - Ventricular rate = 140's-150's. BP = 100/63. K+ = 4.7 at 4:15 PM.  eICU Interventions  Will order: 1. Amiodarone 150 mg IV bolus. 2. Mg++ level STAT.     Intervention Category Major Interventions: Arrhythmia - evaluation and management  Sommer,Steven Eugene 02/15/2019, 8:30 PM

## 2019-02-15 NOTE — Progress Notes (Signed)
eLink Physician-Brief Progress Note Patient Name: Gabrielle Wright DOB: 12/07/1945 MRN: [id number]   Date of Service  02/15/2019  HPI/Events of Note  Diarrhea - Large amount of liquid stool. Request for a Flexiseal.   eICU Interventions  Will order Flexiseal.      Intervention Category Major Interventions: Other:  Lenell Antu 02/15/2019, 9:47 PM

## 2019-02-16 ENCOUNTER — Inpatient Hospital Stay (HOSPITAL_COMMUNITY): Payer: Medicaid Other

## 2019-02-16 LAB — GLUCOSE, CAPILLARY
Glucose-Capillary: 278 mg/dL — ABNORMAL HIGH (ref 70–99)
Glucose-Capillary: 280 mg/dL — ABNORMAL HIGH (ref 70–99)
Glucose-Capillary: 284 mg/dL — ABNORMAL HIGH (ref 70–99)
Glucose-Capillary: 298 mg/dL — ABNORMAL HIGH (ref 70–99)
Glucose-Capillary: 300 mg/dL — ABNORMAL HIGH (ref 70–99)
Glucose-Capillary: 309 mg/dL — ABNORMAL HIGH (ref 70–99)

## 2019-02-16 LAB — PHOSPHORUS: Phosphorus: 5.9 mg/dL — ABNORMAL HIGH (ref 2.5–4.6)

## 2019-02-16 LAB — BASIC METABOLIC PANEL
Anion gap: 13 (ref 5–15)
BUN: 90 mg/dL — ABNORMAL HIGH (ref 8–23)
CO2: 25 mmol/L (ref 22–32)
CREATININE: 2.14 mg/dL — AB (ref 0.44–1.00)
Calcium: 8.7 mg/dL — ABNORMAL LOW (ref 8.9–10.3)
Chloride: 100 mmol/L (ref 98–111)
GFR calc non Af Amer: 22 mL/min — ABNORMAL LOW (ref >60)
GFR, EST AFRICAN AMERICAN: 26 mL/min — AB (ref >60)
Glucose, Bld: 295 mg/dL — ABNORMAL HIGH (ref 70–99)
Potassium: 4 mmol/L (ref 3.5–5.1)
Sodium: 138 mmol/L (ref 135–145)

## 2019-02-16 LAB — COOXEMETRY PANEL
Carboxyhemoglobin: 1.4 % (ref 0.5–1.5)
Methemoglobin: 1.4 % (ref 0.0–1.5)
O2 SAT: 56.7 %
Total hemoglobin: 8.3 g/dL — ABNORMAL LOW (ref 12.0–16.0)

## 2019-02-16 LAB — CBC
HCT: 25.1 % — ABNORMAL LOW (ref 36.0–46.0)
Hemoglobin: 7 g/dL — ABNORMAL LOW (ref 12.0–15.0)
MCH: 23.6 pg — ABNORMAL LOW (ref 26.0–34.0)
MCHC: 27.9 g/dL — ABNORMAL LOW (ref 30.0–36.0)
MCV: 84.8 fL (ref 80.0–100.0)
Platelets: 378 10*3/uL (ref 150–400)
RBC: 2.96 MIL/uL — ABNORMAL LOW (ref 3.87–5.11)
RDW: 19.9 % — ABNORMAL HIGH (ref 11.5–15.5)
WBC: 17.3 10*3/uL — ABNORMAL HIGH (ref 4.0–10.5)
nRBC: 1.5 % — ABNORMAL HIGH (ref 0.0–0.2)

## 2019-02-16 LAB — ABO/RH: ABO/RH(D): O POS

## 2019-02-16 LAB — PREPARE RBC (CROSSMATCH)

## 2019-02-16 LAB — MAGNESIUM: Magnesium: 3.1 mg/dL — ABNORMAL HIGH (ref 1.7–2.4)

## 2019-02-16 MED ORDER — SODIUM CHLORIDE 0.9% IV SOLUTION
Freq: Once | INTRAVENOUS | Status: AC
  Administered 2019-02-16: 20 mL via INTRAVENOUS

## 2019-02-16 MED ORDER — ISOSORB DINITRATE-HYDRALAZINE 20-37.5 MG PO TABS
1.0000 | ORAL_TABLET | Freq: Three times a day (TID) | ORAL | Status: DC
  Administered 2019-02-16 – 2019-02-24 (×26): 1 via ORAL
  Filled 2019-02-16 (×27): qty 1

## 2019-02-16 NOTE — Progress Notes (Signed)
NAME:  Gabrielle Wright, MRN:  [id number], DOB:  Mar 13, 1946, LOS: 12 ADMISSION DATE:  02/03/2019, CONSULTATION DATE: 02/04/2019 REFERRING MD: Emergency department physician cHIEF COMPLAINT: Fever respiratory failure  Brief History   73 year old elderly woman visiting from [place], admitted 3/10 with fevers, hypotension, found to have group A strep bacteremia, required mechanical ventilation Course complicated by non-STEMI and new LV dysfunction ? Ischemic vs Takatsubo's Failed extubation 3/18 due to acute pulmonary edema  Past Medical History  Hypertension Coronary artery disease Suspected diabetes review of her arterial consult receiving correct.  Significant Hospital IsEvents   02/04/2019 transfer from [facility] to [facility] required intubation and pressors. 3/16 off Levophed 3/18 extubated briefly but reintubated due to pulmonary edema 3/19 started milrinone 3/20 Episode of respiratory distress,  Precedex changed to propofol Atrial fibrillation RVR >>amiodarone bolus >> sinus rhythm 3/21 rising creat , AF-RVR >> amio bolus  Consults:  02/04/2019 ID   Procedures:  02/04/2019 intubation>> 3/18, 3/18 >> 02/04/2019 CVL>>  Significant Diagnostic Tests:  Echo 3/15 decreased EF 35 to 40%, LVEDP 27, no significant MR Cardiac cath >> LVEDP 40, RCA totally occluded, patent OM1 stent but distally occluded, apical LAD diffusely diseased, no intervention  Micro Data:  02/04/2019 blood cultures x2>> Gr A strep 02/04/2019 sputum culture>> neg 02/04/2019 urine culture>>neg 02/04/2019 respiratory virus panel>> rhinovirus 02/04/2019 flu a and B>> neg coronavirus testing 02/04/2019>>neg resp 3/21 neg   Antimicrobials:  02/04/2019 vancomycin>>3/ 10 02/04/2019 Zosyn>> 3/10 Pen G 3/10 >> 3/22 (plan) ceftx 3/20 >>  Interim history/subjective:  Remains critically ill, intubated Sedated on propofol and fentanyl Had bowel movement yesterday with lactulose Afebrile Urine output  dropped off since Lasix held   Objective   Blood pressure (!) 120/54, pulse (!) 102, temperature 98.7 F (37.1 C), temperature source Axillary, resp. rate 15, height 5\' 6"  (1.676 m), weight 83 kg, SpO2 96 %. CVP:  [12 mmHg-16 mmHg] 12 mmHg  Vent Mode: PSV;CPAP FiO2 (%):  [40 %] 40 % Set Rate:  [18 bmp] 18 bmp Vt Set:  [450 mL] 450 mL PEEP:  [5 cmH20] 5 cmH20 Pressure Support:  [5 cmH20] 5 cmH20 Plateau Pressure:  [16 cmH20-31 cmH20] 16 cmH20   Intake/Output Summary (Last 24 hours) at 02/16/2019 0809 Last data filed at 02/16/2019 0700 Gross per 24 hour  Intake 1673.13 ml  Output 980 ml  Net 693.13 ml   Filed Weights   02/14/19 0500 02/15/19 0439 02/16/19 0455  Weight: 89 kg 83.2 kg 83 kg   Examination: Elderly woman, intubated, no distress, sedated on propofol/fentanyl No pallor, icterus, no JVD RA SS 0, eyes open, not agitated, follows one-step commands, language barrier Decreased breath sounds bilateral  Soft  abdomen, nontender, less distended S1-S2 regular on monitor 1+ edema  Chest x-ray 3/22 personally reviewed which shows bilateral lower lobe airspace disease versus layering effusions  Resolved Hospital Problem list     Assessment & Plan:  Septic shock/group A strep bacteremia - Viral testing  negative for COVID, positive for rhino  -Off pressors, changed from penicillin to ceftriaxone, plan was to stop 3/21 but leukocytosis concerning, will continue until 3/23  Acute respiratory failure/acute pulmonary edema - SBTs with goal extubation to BiPAP -Would like more negative balance before extubating but limited by rising creatinine  AKI  -due to sepsis/ARB , improving Hyperkalemia - resolved -Lasix 80 every 12, achieved negative balance but creatinine climbing now and will hold   Ischemic cardiomyopathy/LVEDP on cath with no targets for revascularization -  ASA + Plavix -BP has improved and will start hydralazine/nitrates- bidil  -Continue milrinone  0.375  -co-ox still low   Diabetes type 2/uncontrolled hyperglycemia -Was on high dose of 70/30 at home -Increase Levemir back to 60 twice daily,ct  tube feed coverage to 8 units every 6 hours -SSI resistant q 4h  Abdominal distention/ Ileus -improved Minimize  fentanyl Ct  tube feeds at goal Add lactulose prn constipation  Acute encephalopathy-intermittent severe agitation, worsened by language barrier -Using propofol, goal RA SS -1 -Minimize fentanyl drip  Anemia of critical illness-no evidence of blood loss - 3/21 1 unit PRBC for hemoglobin of 6.6, goal 8 - transfuse 1 more unit today for Hb 7  Daughter updated daily  Summary-acute pulmonary edema due to non-STEMI/cardiomyopathy, milrinone seems to have helped , goal is to attempt another extubation in the next 24 to 48 hours once cardiac status optimized and more negative balance achieved   The patient is critically ill with multiple organ systems failure and requires high complexity decision making for assessment and support, frequent evaluation and titration of therapies, application of advanced monitoring technologies and extensive interpretation of multiple databases. Critical Care Time devoted to patient care services described in this note independent of APP/resident  time is 35 minutes.      Cyril Mourning MD. Tonny Bollman. [place] [facility] Pager [phone] If no response call [phone]   02/16/2019

## 2019-02-16 NOTE — Progress Notes (Signed)
SLP Cancellation Note  Patient Details Name: Loriene Wuerth MRN: [id number] DOB: 1946-05-14   Cancelled treatment:       Reason Eval/Treat Not Completed: Medical issues which prohibited therapy; pt remains intubated.  Our service will sign off.   Blenda Mounts Laurice 02/16/2019, 9:25 AM

## 2019-02-16 NOTE — Progress Notes (Signed)
eLink Physician-Brief Progress Note Patient Name: Gabrielle Wright DOB: Jul 26, 1946 MRN: [id number]   Date of Service  02/16/2019  HPI/Events of Note  AFIB with RVR - Ventricular rate = 131-140. Patient on Amiodarone PO.  K+ = 4.0 and Mg++ = 3.1 this morning.   eICU Interventions  Will order: 1. Amiodarone 150 mg IV now.      Intervention Category Major Interventions: Arrhythmia - evaluation and management  Gorje Iyer Dennard Nip 02/16/2019, 9:41 PM

## 2019-02-17 ENCOUNTER — Inpatient Hospital Stay (HOSPITAL_COMMUNITY): Payer: Medicaid Other

## 2019-02-17 DIAGNOSIS — R6521 Severe sepsis with septic shock: Secondary | ICD-10-CM

## 2019-02-17 DIAGNOSIS — A419 Sepsis, unspecified organism: Secondary | ICD-10-CM

## 2019-02-17 LAB — GLUCOSE, CAPILLARY
GLUCOSE-CAPILLARY: 263 mg/dL — AB (ref 70–99)
Glucose-Capillary: 214 mg/dL — ABNORMAL HIGH (ref 70–99)
Glucose-Capillary: 215 mg/dL — ABNORMAL HIGH (ref 70–99)
Glucose-Capillary: 241 mg/dL — ABNORMAL HIGH (ref 70–99)
Glucose-Capillary: 243 mg/dL — ABNORMAL HIGH (ref 70–99)
Glucose-Capillary: 267 mg/dL — ABNORMAL HIGH (ref 70–99)
Glucose-Capillary: 280 mg/dL — ABNORMAL HIGH (ref 70–99)

## 2019-02-17 LAB — CBC
HCT: 27.9 % — ABNORMAL LOW (ref 36.0–46.0)
Hemoglobin: 8.1 g/dL — ABNORMAL LOW (ref 12.0–15.0)
MCH: 24.6 pg — ABNORMAL LOW (ref 26.0–34.0)
MCHC: 29 g/dL — ABNORMAL LOW (ref 30.0–36.0)
MCV: 84.8 fL (ref 80.0–100.0)
Platelets: 395 10*3/uL (ref 150–400)
RBC: 3.29 MIL/uL — ABNORMAL LOW (ref 3.87–5.11)
RDW: 19.3 % — ABNORMAL HIGH (ref 11.5–15.5)
WBC: 17.8 10*3/uL — AB (ref 4.0–10.5)
nRBC: 1.3 % — ABNORMAL HIGH (ref 0.0–0.2)

## 2019-02-17 LAB — TYPE AND SCREEN
ABO/RH(D): O POS
Antibody Screen: NEGATIVE
Unit division: 0

## 2019-02-17 LAB — BASIC METABOLIC PANEL
Anion gap: 14 (ref 5–15)
BUN: 99 mg/dL — ABNORMAL HIGH (ref 8–23)
CO2: 25 mmol/L (ref 22–32)
Calcium: 9.2 mg/dL (ref 8.9–10.3)
Chloride: 100 mmol/L (ref 98–111)
Creatinine, Ser: 2 mg/dL — ABNORMAL HIGH (ref 0.44–1.00)
GFR calc Af Amer: 28 mL/min — ABNORMAL LOW (ref >60)
GFR calc non Af Amer: 24 mL/min — ABNORMAL LOW (ref >60)
Glucose, Bld: 226 mg/dL — ABNORMAL HIGH (ref 70–99)
Potassium: 3.7 mmol/L (ref 3.5–5.1)
Sodium: 139 mmol/L (ref 135–145)

## 2019-02-17 LAB — BPAM RBC
Blood Product Expiration Date: 202003272359
ISSUE DATE / TIME: 202003221048
Unit Type and Rh: 5100

## 2019-02-17 LAB — COOXEMETRY PANEL
Carboxyhemoglobin: 1.6 % — ABNORMAL HIGH (ref 0.5–1.5)
Methemoglobin: 1.1 % (ref 0.0–1.5)
O2 SAT: 67.2 %
Total hemoglobin: 8.7 g/dL — ABNORMAL LOW (ref 12.0–16.0)

## 2019-02-17 LAB — PHOSPHORUS: PHOSPHORUS: 5.8 mg/dL — AB (ref 2.5–4.6)

## 2019-02-17 LAB — TRIGLYCERIDES: Triglycerides: 161 mg/dL — ABNORMAL HIGH (ref <150)

## 2019-02-17 LAB — MAGNESIUM: Magnesium: 3.2 mg/dL — ABNORMAL HIGH (ref 1.7–2.4)

## 2019-02-17 MED ORDER — INSULIN ASPART 100 UNIT/ML ~~LOC~~ SOLN
10.0000 [IU] | Freq: Four times a day (QID) | SUBCUTANEOUS | Status: DC
  Administered 2019-02-17 – 2019-02-19 (×6): 10 [IU] via SUBCUTANEOUS

## 2019-02-17 MED ORDER — FUROSEMIDE 10 MG/ML IJ SOLN
20.0000 mg | Freq: Two times a day (BID) | INTRAMUSCULAR | Status: DC
  Administered 2019-02-17 – 2019-02-18 (×2): 20 mg via INTRAVENOUS
  Filled 2019-02-17 (×2): qty 2

## 2019-02-17 MED ORDER — VITAL AF 1.2 CAL PO LIQD
1000.0000 mL | ORAL | Status: DC
  Administered 2019-02-19 – 2019-02-20 (×2): 1000 mL

## 2019-02-17 MED ORDER — AMIODARONE IV BOLUS ONLY 150 MG/100ML
150.0000 mg | Freq: Once | INTRAVENOUS | Status: AC
  Administered 2019-02-17: 150 mg via INTRAVENOUS
  Filled 2019-02-17: qty 100

## 2019-02-17 MED ORDER — PANTOPRAZOLE SODIUM 40 MG PO PACK
40.0000 mg | PACK | Freq: Every day | ORAL | Status: DC
  Administered 2019-02-17 – 2019-03-05 (×17): 40 mg
  Filled 2019-02-17 (×17): qty 20

## 2019-02-17 MED ORDER — SEVELAMER CARBONATE 800 MG PO TABS
800.0000 mg | ORAL_TABLET | Freq: Three times a day (TID) | ORAL | Status: AC
  Administered 2019-02-17 (×3): 800 mg
  Filled 2019-02-17 (×3): qty 1

## 2019-02-17 MED ORDER — SENNOSIDES 8.8 MG/5ML PO SYRP
10.0000 mL | ORAL_SOLUTION | Freq: Every evening | ORAL | Status: DC | PRN
  Filled 2019-02-17: qty 10

## 2019-02-17 MED ORDER — PRO-STAT SUGAR FREE PO LIQD
30.0000 mL | Freq: Four times a day (QID) | ORAL | Status: DC
  Administered 2019-02-17 – 2019-02-21 (×15): 30 mL
  Filled 2019-02-17 (×15): qty 30

## 2019-02-17 MED ORDER — BISACODYL 10 MG RE SUPP: 10.0000 mg | Freq: Every day | RECTAL | Status: DC | PRN

## 2019-02-17 NOTE — Progress Notes (Signed)
Occupational Therapy Treatment Patient Details Name: Gabrielle Wright MRN: [id number] DOB: 1946/05/04 Today's Date: 02/17/2019    History of present illness 73 y.o. female admitted on 02/03/19 for fever and cough (to [facility]) and was transferred to [facility] on 02/04/19 for hypotension and respiratory distress (acute respiratory failure/pulmonary edema) requiring intubation 3/10-current (time of PT eval 02/11/19).  She was visiting family from [place] and COVID 19 tests were negative.  She was found to have group A strep bacteremia (septic shiock), rhinovirus,  and course complicated by NSTEMI and new LV dysfunction s/p cardiac cath on 02/09/19.  Cardiology following.  Other dx include ischemic cardiomyopathy, hyperkalemia, DM2- uncontrolled, and abdominal distention.  Pt with significant PMH of DM, HTN, anc CAD.   OT comments  Pt seen with RN monitoring VS and assisting with lines. Used video interpreter [phone] as pt speaks Bengali. Pt nodding head appropriately and following commands about 50% of time with delay. Sat EOB with max to total assist x 9 minutes. +2 total assist for bed level mobility. Will continue to follow.  Follow Up Recommendations  Supervision/Assistance - 24 hour(post acute rehab)    Equipment Recommendations  3 in 1 bedside commode    Recommendations for Other Services      Precautions / Restrictions Precautions Precautions: Fall;Other (comment) Precaution Comments: ventilated, feeding tube (with ETT), restraints (wrist)       Mobility Bed Mobility Overal bed mobility: Needs Assistance Bed Mobility: Supine to Sit;Sit to Supine     Supine to sit: +2 for physical assistance;Total assist Sit to supine: +2 for physical assistance;Total assist   General bed mobility comments: Two person total assist to progress EOB and back to supine.  Pt given opportunity to assist in taking legs over EOB and she was unable.    Transfers                 General  transfer comment: deferred    Balance Overall balance assessment: Needs assistance Sitting-balance support: Feet unsupported;Bilateral upper extremity supported Sitting balance-Leahy Scale: Zero Sitting balance - Comments: max to total assist EOB.  She is not heavy assist at the trunk, however, when support removed no sigs of trunk initiation, righting reactions, or hip flexion (even delayed) to try to hold herself up EOB. Worked EOB 8-10 mins on following basic commands in bil UE/LEs, VSS on PRVC 40% FiO2, PEEP 5, 2-3 100% breaths during, BP  and HR stable.                                   ADL either performed or assessed with clinical judgement   ADL                                         General ADL Comments: requires total assist     Vision       Perception     Praxis      Cognition Arousal/Alertness: Lethargic;Suspect due to medications Behavior During Therapy: Flat affect Overall Cognitive Status: Difficult to assess                                 General Comments: Pt able to follow 50% of basic one step commands, lethargic due to precidex, seems to  nod appropriately to interpreter questions.        Exercises     Shoulder Instructions       General Comments      Pertinent Vitals/ Pain       Pain Assessment: Faces Faces Pain Scale: Hurts little more Pain Location: generalized Pain Descriptors / Indicators: Grimacing Pain Intervention(s): Repositioned;Monitored during session;Limited activity within patient's tolerance  Home Living                                          Prior Functioning/Environment              Frequency  Min 2X/week        Progress Toward Goals  OT Goals(current goals can now be found in the care plan section)  Progress towards OT goals: Not progressing toward goals - comment  Acute Rehab OT Goals Patient Stated Goal: unable to state due to ETT OT Goal  Formulation: With family Time For Goal Achievement: 02/28/19 Potential to Achieve Goals: Good  Plan Discharge plan remains appropriate    Co-evaluation    PT/OT/SLP Co-Evaluation/Treatment: Yes Reason for Co-Treatment: Complexity of the patient's impairments (multi-system involvement) PT goals addressed during session: Mobility/safety with mobility;Balance;Strengthening/ROM OT goals addressed during session: Strengthening/ROM      AM-PAC OT "6 Clicks" Daily Activity     Outcome Measure   Help from another person eating meals?: Total Help from another person taking care of personal grooming?: Total Help from another person toileting, which includes using toliet, bedpan, or urinal?: Total Help from another person bathing (including washing, rinsing, drying)?: Total Help from another person to put on and taking off regular upper body clothing?: Total Help from another person to put on and taking off regular lower body clothing?: Total 6 Click Score: 6    End of Session    OT Visit Diagnosis: Muscle weakness (generalized) (M62.81);Other symptoms and signs involving cognitive function   Activity Tolerance Patient tolerated treatment well   Patient Left in bed;with nursing/sitter in room(in chair position)   Nurse Communication Other (comment)(RN monitoring vitals and lines)        Time: 1034-1101 OT Time Calculation (min): 27 min  Charges: OT General Charges $OT Visit: 1 Visit OT Treatments $Therapeutic Activity: 8-22 mins  Martie Round, OTR/L Acute Rehabilitation Services Pager: [phone] Office: [phone]   Evern Bio 02/17/2019, 11:37 AM

## 2019-02-17 NOTE — Progress Notes (Addendum)
Physical Therapy Treatment Patient Details Name: Araseli Baxter MRN: [id number] DOB: [birth year] Today's Date: 02/17/2019    History of Present Illness 73 y.o. female admitted on 02/03/19 for fever and cough (to [facility]) and was transferred to [facility] on 02/04/19 for hypotension and respiratory distress (acute respiratory failure/pulmonary edema) requiring intubation 3/10-current (time of PT eval 02/11/19).  She was visiting family from [place] and COVID 19 tests were negative.  She was found to have group A strep bacteremia (septic shiock), rhinovirus,  and course complicated by NSTEMI and new LV dysfunction s/p cardiac cath on 02/09/19.  Cardiology following.  Other dx include ischemic cardiomyopathy, hyperkalemia, DM2- uncontrolled, and abdominal distention.  Pt with significant PMH of DM, HTN, anc CAD.    PT Comments    Pt tolerated ~8-10 mins EOB today, however, she continues to require a significant amount of assistance for EOB (max to total) and remains intubated (with mucus plug earlier this AM requiring deep suction).  She needs more assist than on evaluation, so we are updating our d/c recs to post acute rehab.  She seems to understand the tele-interpreter, nodding appropriately when she asks a question.  She is only following 50% of basic commands and some of that may be lethargy (precidex decreased for session), and/or ability (bil UEs have significant edema).  PT will continue to follow acutely for safe mobility progression.    02/17/19 1100  Language Assistant  Interpreter Mode Video Remote Interpreter  Interpreter Name [place]  Interpreter Phone Number - If applicable #[phone]     Follow Up Recommendations  Supervision/Assistance - 24 hour;CIR     Equipment Recommendations  Rolling walker with 5" wheels;Wheelchair (measurements PT);Wheelchair cushion (measurements PT);3in1 (PT)    Recommendations for Other Services Rehab consult     Precautions / Restrictions  Precautions Precautions: Fall;Other (comment) Precaution Comments: ventilated, feeding tube (with ETT), restraints (wrist)    Mobility  Bed Mobility Overal bed mobility: Needs Assistance Bed Mobility: Supine to Sit;Sit to Supine     Supine to sit: +2 for physical assistance;Total assist Sit to supine: +2 for physical assistance;Total assist   General bed mobility comments: Two person total assist to progress EOB and back to supine.  Pt given opportunity to assist in taking legs over EOB and she was unable.           Balance Overall balance assessment: Needs assistance Sitting-balance support: Feet unsupported;Bilateral upper extremity supported Sitting balance-Leahy Scale: Zero Sitting balance - Comments: max to total assist EOB.  She is not heavy assist at the trunk, however, when support removed no sigs of trunk initiation, righting reactions, or hip flexion (even delayed) to try to hold herself up EOB. Worked EOB 8-10 mins on following basic commands in bil UE/LEs, VSS on PRVC 40% FiO2, PEEP 5, 2-3 100% breaths during, BP  and HR stable.                                    Cognition Arousal/Alertness: Lethargic;Suspect due to medications Behavior During Therapy: Flat affect Overall Cognitive Status: Difficult to assess                                 General Comments: Pt able to follow 50% of basic one step commands, lethargic due to precidex, seems to nod appropriately to interpreter questions.  Pertinent Vitals/Pain Pain Assessment: Faces Faces Pain Scale: Hurts little more Pain Location: generalized Pain Descriptors / Indicators: Grimacing Pain Intervention(s): Limited activity within patient's tolerance;Monitored during session;Repositioned           PT Goals (current goals can now be found in the care plan section) Acute Rehab PT Goals Patient Stated Goal: unable to state due to ETT Progress towards PT goals:  Progressing toward goals    Frequency    Min 3X/week      PT Plan Discharge plan needs to be updated    Co-evaluation PT/OT/SLP Co-Evaluation/Treatment: Yes Reason for Co-Treatment: Complexity of the patient's impairments (multi-system involvement);Necessary to address cognition/behavior during functional activity;To address functional/ADL transfers;For patient/therapist safety PT goals addressed during session: Mobility/safety with mobility;Balance;Strengthening/ROM        AM-PAC PT "6 Clicks" Mobility   Outcome Measure  Help needed turning from your back to your side while in a flat bed without using bedrails?: Total Help needed moving from lying on your back to sitting on the side of a flat bed without using bedrails?: Total Help needed moving to and from a bed to a chair (including a wheelchair)?: Total Help needed standing up from a chair using your arms (e.g., wheelchair or bedside chair)?: Total Help needed to walk in hospital room?: Total Help needed climbing 3-5 steps with a railing? : Total 6 Click Score: 6    End of Session Equipment Utilized During Treatment: Oxygen(ETT) Activity Tolerance: Patient limited by fatigue Patient left: in bed;Other (comment)(in chair mode) Nurse Communication: Other (comment)(RN in room during the session) PT Visit Diagnosis: Muscle weakness (generalized) (M62.81);Difficulty in walking, not elsewhere classified (R26.2)     Time: 5852-7782 PT Time Calculation (min) (ACUTE ONLY): 27 min  Charges:  $Therapeutic Activity: 8-22 mins           Dominigue Gellner B. Azoria Abbett, PT, DPT  Acute Rehabilitation [phone] pager #[phone] office            02/17/2019, 11:21 AM

## 2019-02-17 NOTE — Progress Notes (Signed)
Inpatient Diabetes Program Recommendations  AACE/ADA: New Consensus Statement on Inpatient Glycemic Control (2015)  Target Ranges:  Prepandial:   less than 140 mg/dL      Peak postprandial:   less than 180 mg/dL (1-2 hours)      Critically ill patients:  140 - 180 mg/dL   Lab Results  Component Value Date   GLUCAP 215 (H) 02/17/2019   HGBA1C 10.1 (H) 02/04/2019    Review of Glycemic Control Results for ARDENA, TALAMANTES (MRN [id number]) as of 02/17/2019 14:17  Ref. Range 02/16/2019 19:30 02/16/2019 23:56 02/17/2019 04:25 02/17/2019 07:36 02/17/2019 11:22  Glucose-Capillary Latest Ref Range: 70 - 99 mg/dL 916 (H) 945 (H) 038 (H) 214 (H) 215 (H)   Diabetes history: DM 2 Outpatient Diabetes medications:  Novolin 70/30 28-30 units bid, Humulin R- 10 units with lunch Current orders for Inpatient glycemic control:  Levemir 60 units bid, TCTS Correction q 4 hours, Novolog 10 units q 6 hours  Inpatient Diabetes Program Recommendations:    May consider increasing frequency of tube feed coverage to q 4 hours (to be given with correction).    Thanks,  Beryl Meager, RN, BC-ADM Inpatient Diabetes Coordinator Pager [phone] (8a-5p)

## 2019-02-17 NOTE — Progress Notes (Signed)
RT called to bedside for decrease in SAT. When I arrived, sats in the 70's, could not pass suction cath, not good return volumes. Lavaged for moderate size mucous plug, SAT now 98% and acceptable return volumes. Patient still seems very anxious. RN aware.

## 2019-02-17 NOTE — Progress Notes (Signed)
Rehab Admissions Coordinator Note:  Patient was screened by Clois Dupes for appropriateness for an Inpatient Acute Rehab Consult per therapy recs.  At this time, we are recommending follow pt's progress with therapy. Not at a level to consider for an inpt rehab admit at this time, but I will follow.  Clois Dupes RN MSN 02/17/2019, 12:16 PM  I can be reached at [phone].

## 2019-02-17 NOTE — Progress Notes (Signed)
NAME:  Gabrielle Wright, MRN:  [id number], DOB:  [birth year], LOS: 13 ADMISSION DATE:  02/03/2019, CONSULTATION DATE: 02/04/2019 REFERRING MD: Emergency department physician cHIEF COMPLAINT: Fever respiratory failure  Brief History   73 year old elderly woman visiting from [place], admitted 3/10 with fevers, hypotension, found to have group A strep bacteremia, required mechanical ventilation Course complicated by non-STEMI and new LV dysfunction ? Ischemic vs Takatsubo's Failed extubation 3/18 due to acute pulmonary edema  Past Medical History  Hypertension Coronary artery disease Suspected diabetes review of her arterial consult receiving correct.  Significant Hospital IsEvents   02/04/2019 transfer from [facility] to [facility] required intubation and pressors. 3/16 off Levophed 3/18 extubated briefly but reintubated due to pulmonary edema 3/19 started milrinone 3/20 Episode of respiratory distress,  Precedex changed to propofol Atrial fibrillation RVR >>amiodarone bolus >> sinus rhythm 3/21 rising creat , AF-RVR >> amio bolus   Consults:  02/04/2019 ID   Procedures:  02/04/2019 intubation>> 3/18, 3/18 >> 02/04/2019 CVL>>  Significant Diagnostic Tests:  Echo 3/15 decreased EF 35 to 40%, LVEDP 27, no significant MR Cardiac cath >> LVEDP 40, RCA totally occluded, patent OM1 stent but distally occluded, apical LAD diffusely diseased, no intervention Chest x-ray 3/22  bilateral lower lobe airspace disease versus layering effusions CXR 3/23> pulmonary edema and bilateral pleural effusions. Personally reviewed   Micro Data:  02/04/2019 blood cultures x2>> Gr A strep 02/04/2019 sputum culture>> neg 02/04/2019 urine culture>>neg 02/04/2019 respiratory virus panel>> rhinovirus 02/04/2019 flu a and B>> neg coronavirus testing 02/04/2019>>neg resp 3/21 neg  Antimicrobials:  02/04/2019 vancomycin>>3/ 10 02/04/2019 Zosyn>> 3/10 Pen G 3/10 >> 3/22 (plan) ceftx 3/20 >>3/23   Interim history/subjective:  Mucus plug this morning during SBT with associated hypoxia Continues on milrinone, is off NE.  Sat at side of bed with PT   Objective   Blood pressure (!) 150/65, pulse 96, temperature 98.1 F (36.7 C), temperature source Oral, resp. rate 18, height 5\' 6"  (1.676 m), weight 79.4 kg, SpO2 93 %. CVP:  [10 mmHg-21 mmHg] 16 mmHg  Vent Mode: PRVC FiO2 (%):  [40 %] 40 % Set Rate:  [18 bmp] 18 bmp Vt Set:  [450 mL] 450 mL PEEP:  [5 cmH20] 5 cmH20 Pressure Support:  [5 cmH20] 5 cmH20 Plateau Pressure:  [16 cmH20-23 cmH20] 20 cmH20   Intake/Output Summary (Last 24 hours) at 02/17/2019 1137 Last data filed at 02/17/2019 1119 Gross per 24 hour  Intake 1958.79 ml  Output 1601 ml  Net 357.79 ml   Filed Weights   02/15/19 0439 02/16/19 0455 02/17/19 0455  Weight: 83.2 kg 83 kg 79.4 kg   Examination: General: Elderly adult female, critically ill appearing, intubated, sedated laying in bed in NAD Neuro: Sedated, RASS 0. Follows simple commands. PERRL.  HEENT: NCAT. ETT OGT secure. Trachea midline. Pink mmm. Anicteric sclera  Pulm: Diminished bibasilar breath sounds  Cardiovascualr: RRR s1s2 no r/g/m 2+ radial pulses  Abdomen: soft, round, ndnt, normoactive x4 Extremities: Symmetrical bulk and tone, 2+ extremity edema Skin: Pale, clean, dry, warm, intact without rash   Resolved Hospital Problem list     Assessment & Plan:   Septic shock/group A strep bacteremia - Viral testing  negative for COVID, positive for rhino  -Off pressors, changed from penicillin to ceftriaxone, plan was to stop 3/21 but leukocytosis concerning, will continue until 3/23 P - Persistent leukocytosis despite recent abx course - afebrile -If new fever, re-culture -Will continue to trend CBC to evaluate WBC count  Acute respiratory failure -acute pulmonary edema -pleural effusions P - AM SBT/WUA -When extubation ready, extubate to BiPAP -Lasix 20 BID -Patient is net + 7L for  admission, fluid overloaded on physical exam, CVP 18. Evaluate for diuresis tomorrow  -Titrate PEEP/FiO2 for SpO2 >92 -AM CXR  Acute Kidney Injury -3/23 Cr 2.00  -etiology likely multifactorial-- Sepsis, ARB, diuresis Hyperkalemia - resolved Hyperphosphatemia Hypermagnesemia P -Lasix 20 BID -Continue to trend BMP, resume diuresis when clinically appropriate -Continue Strict I/O -Continue to evaluate CVP q4hr -Replete electrolytes PRN -Starting sevelamer  -Re-consult RDN-- patient may benefit from changing to Nepro  -Avoid nephrotoxic agents   Ischemic Cardiomyopathy/ LVEDP  - with no targets for revascularization on cath P - Continue ASA + Plavix -continue hydralazine/nitrates- bidil  -Continue milrinone  -check co-ox tomorrow morning   Atrial Fibrillation with RVR P -continue Amiodarone  -Continue telemetry   Diabetes type 2/uncontrolled hyperglycemia -Was on high dose of 70/30 at home P -SSI resistant q 4h -Levemir 60 BID + EN coverage Novolog 8units q6hr + Resistant SSI q4  Abdominal distention/ Ileus -improved P Continue to minimize opioid administration Continue Miralax, PRN Senokot, PRN dulcolax  Acute encephalopathy-intermittent severe agitation, worsened by language barrier P -RASS goal 0 to -1 -Minimize sedating agents as able -Currently propofol gtt, fentanyl gtt PRN fentanyl, PRN versed   Acute Anemia -anemia of critical illness -no evidence of blood loss - s/p 1 PRBC 3/21, 1 PRBC 3/22 P -Continue to trend H/H on CBC -Goal Hgb >8 -Transfuse PRN for goal Hgb   GI ppx: Protonix VAP: Continue Pain/Sedation: propofol, fentanyl versed DVT ppx: Heparin SQ  Diet: Enteral Nutrition Code Status: full Family Communication: Daughter updated on phone  Critical Care Time: 35 minutes   Tessie Fass MSN, AGACNP-BC [place] [facility] [phone] If no answer, [phone] 02/17/2019, 11:37 AM

## 2019-02-17 NOTE — Progress Notes (Addendum)
Nutrition Follow-up / Consult  DOCUMENTATION CODES:   Not applicable  INTERVENTION:    To better meet re-estimated nutrition needs, taking into account kcals being provided by Propofol, will decrease Vital AF 1.2 goal rate to 30 ml/h (720 ml per day)  Pro-stat 30 ml QID  Provides 1264 kcal (1478 kcal total with propofol), 114 gm protein, 584 ml free water, 608 mg phosphorus per day  NUTRITION DIAGNOSIS:   Inadequate oral intake related to inability to eat as evidenced by NPO status.  Ongoing   GOAL:   Patient will meet greater than or equal to 90% of their needs  Met with TF  MONITOR:   Vent status, TF tolerance, Labs, I & O's  REASON FOR ASSESSMENT:   Consult Enteral/tube feeding initiation and management  ASSESSMENT:   73 yo female with PMH of DM-2, HTN, CAD from [place] who was admitted with fever and respiratory failure requiring intubation. Admission complicated by NSTEMI and new LV dysfunction.  Received consult to evaluate electrolyte provision from TF; serum phosphorus is elevated. Nepro is restricted in volume, potassium, and phosphorus. Current TF is providing less than the recommended phosphorus limit for renal failure. No need to change to Nepro.  Currently receiving Vital AF 1.2 via OGT at 40 ml/h (960 ml/day) with Prostat 30 ml TID to provide 1452 kcals, 117 gm protein, 779 ml free water, 810 mg phosphorus daily.   Patient remains intubated on ventilator support MV: 8.3 L/min Temp (24hrs), Avg:98 F (36.7 C), Min:97.7 F (36.5 C), Max:98.4 F (36.9 C)  Propofol: 8.1 ml/hr providing 214 kcal from lipid  Labs reviewed. BUN 99 (H), creatinine 2 (H), phosphorus 5.8 (H), magnesium 3.2 (H) CBG's: 214-215 Medications reviewed and include levemir, novolog  Diet Order:   Diet Order            Diet NPO time specified  Diet effective now              EDUCATION NEEDS:   No education needs have been identified at this time  Skin:  Skin  Assessment: Reviewed RN Assessment  Last BM:  3/23  Height:   Ht Readings from Last 1 Encounters:  02/04/19 _0  (1.676 m)    Weight:   Wt Readings from Last 1 Encounters:  02/17/19 79.4 kg    Ideal Body Weight:  59.1 kg  BMI:  Body mass index is 28.25 kg/m.  Estimated Nutritional Needs:   Kcal:  1500  Protein:  110-120 gm  Fluid:  1.5 L    Molli Barrows, RD, LDN, [place] Pager [phone] After Hours Pager [phone]

## 2019-02-17 NOTE — Progress Notes (Signed)
Progress Note  Patient Name: Gabrielle Wright Date of Encounter: 02/17/2019  Primary Cardiologist: Minus Breeding, MD   Subjective   Intubated; opens eyes  Inpatient Medications    Scheduled Meds: . amiodarone  400 mg Per Tube BID  . aspirin  81 mg Oral Daily  . chlorhexidine gluconate (MEDLINE KIT)  15 mL Mouth Rinse BID  . Chlorhexidine Gluconate Cloth  6 each Topical Q0600  . clopidogrel  75 mg Oral Daily  . feeding supplement (PRO-STAT SUGAR FREE 64)  30 mL Per Tube TID  . feeding supplement (VITAL AF 1.2 CAL)  1,000 mL Per Tube Q24H  . heparin  5,000 Units Subcutaneous Q8H  . insulin aspart  0-24 Units Subcutaneous Q4H  . insulin aspart  8 Units Subcutaneous Q6H  . insulin detemir  60 Units Subcutaneous BID  . isosorbide-hydrALAZINE  1 tablet Oral TID  . mouth rinse  15 mL Mouth Rinse 10 times per day  . multivitamin  15 mL Per Tube Daily  . pantoprazole (PROTONIX) IV  40 mg Intravenous QHS  . polyethylene glycol  17 g Oral Daily  . sodium chloride flush  10-40 mL Intracatheter Q12H   Continuous Infusions: . sodium chloride 10 mL/hr at 02/17/19 0913  . fentaNYL infusion INTRAVENOUS Stopped (02/17/19 0747)  . milrinone 0.375 mcg/kg/min (02/17/19 0913)  . norepinephrine (LEVOPHED) Adult infusion Stopped (02/14/19 0545)  . propofol (DIPRIVAN) infusion 20 mcg/kg/min (02/17/19 0913)   PRN Meds: acetaminophen (TYLENOL) oral liquid 160 mg/5 mL, acetaminophen, dextrose, fentaNYL, fentaNYL (SUBLIMAZE) injection, fentaNYL (SUBLIMAZE) injection, hydrALAZINE, ipratropium-albuterol, midazolam, ondansetron (ZOFRAN) IV, sodium chloride flush   Vital Signs    Vitals:   02/17/19 0747 02/17/19 0800 02/17/19 0900 02/17/19 0907  BP:  130/62 (!) 150/65   Pulse: 86 95 (!) 116   Resp: 18 18 (!) 23   Temp:      TempSrc:      SpO2: 97% 96% 90% 96%  Weight:      Height:        Intake/Output Summary (Last 24 hours) at 02/17/2019 0915 Last data filed at 02/17/2019 0913 Gross per 24  hour  Intake 1971.07 ml  Output 1611 ml  Net 360.07 ml   Last 3 Weights 02/17/2019 02/16/2019 02/15/2019  Weight (lbs) 175 lb 0.7 oz 182 lb 15.7 oz 183 lb 6.8 oz  Weight (kg) 79.4 kg 83 kg 83.2 kg      Telemetry    PAF converting to sinus with PVCs - Personally Reviewed  Physical Exam   GEN: Intubated; opens eyes Neck: Difficult to assess Cardiac: regular and tachycardic Respiratory: Clear to auscultation bilaterally. GI: Soft, nontender, non-distended  MS: No edema Neuro:  Nonfocal  Psych: Normal affect   Labs    Chemistry Recent Labs  Lab 02/15/19 1615 02/16/19 0435 02/17/19 0457  NA 138 138 139  K 4.7 4.0 3.7  CL 100 100 100  CO2 23 25 25   GLUCOSE 293* 295* 226*  BUN 76* 90* 99*  CREATININE 1.93* 2.14* 2.00*  CALCIUM 8.8* 8.7* 9.2  GFRNONAA 25* 22* 24*  GFRAA 29* 26* 28*  ANIONGAP 15 13 14      Hematology Recent Labs  Lab 02/15/19 0547 02/16/19 0435 02/17/19 0457  WBC 20.6* 17.3* 17.8*  RBC 3.11* 2.96* 3.29*  HGB 7.5* 7.0* 8.1*  HCT 26.2* 25.1* 27.9*  MCV 84.2 84.8 84.8  MCH 24.1* 23.6* 24.6*  MCHC 28.6* 27.9* 29.0*  RDW 19.5* 19.9* 19.3*  PLT 360 378 395  Cardiac Enzymes Recent Labs  Lab 02/14/19 0015  TROPONINI 1.96*    BNP Recent Labs  Lab 02/14/19 0015  BNP 807.3*    Radiology    Dg Chest Port 1 View  Result Date: 02/17/2019 CLINICAL DATA:  Acute respiratory failure EXAM: PORTABLE CHEST 1 VIEW COMPARISON:  Yesterday FINDINGS: Endotracheal tube tip just below the clavicular heads. Left subclavian line with tip at the right atrium. Gastric suction tube with tip over the stomach. Haziness of the lower chest attributed to layering pleural fluid. The lower lobes are partially obscured. Generalized interstitial opacity. No pneumothorax. Cardiomegaly.  Coronary stenting. IMPRESSION: 1. Stable hardware positioning. 2. Pulmonary edema and layering pleural effusions. Electronically Signed   By: Monte Fantasia M.D.   On: 02/17/2019 07:12    Dg Chest Port 1 View  Result Date: 02/16/2019 CLINICAL DATA:  Acute respiratory failure EXAM: PORTABLE CHEST 1 VIEW COMPARISON:  02/15/2019 FINDINGS: Endotracheal tube in good position.  NG in the stomach. Diffuse bilateral airspace disease has progressed. Progression of bilateral pleural effusions and bibasilar atelectasis. Central venous catheter tip in the lower right atrium. IMPRESSION: Severe bilateral airspace disease with progression. Progression of bilateral effusions and bibasilar atelectasis. Probable heart failure versus pneumonia Electronically Signed   By: Franchot Gallo M.D.   On: 02/16/2019 07:27    Cardiac Studies   Echocardiogram March 2020-  1. The left ventricle has moderately reduced systolic function, with an ejection fraction of 35-40%. The cavity size was normal. Left ventricular diastolic Doppler parameters are consistent with pseudonormalization. Elevated left ventricular end-diastolic pressure The E/e' is 29. Regional wall motion abnormalities (see coded diagram below).  2. The right ventricle has mildly reduced systolic function. The cavity was normal. There is no increase in right ventricular wall thickness.  3. No hemodynamically significant valvular heart disease.  4. Normal biatrial chamber size.  5. IVC not well visualized.  Cardiac catheterization March 2020-  2nd Mrg-3 lesion is 80% stenosed.  Acute Mrg lesion is 90% stenosed.    Less than 25% left main  Proximal diffuse 30 to 40% narrowing within the previously placed LAD stent.  The entire apical LAD is diffusely diseased with up to 85% stenosis.  The first diagonal contains 85% ostial narrowing.  Circumflex contains diffuse proximal to mid eccentric 50% stenosis the dominant second obtuse marginal contains mid vessel 50% stenosis in the mid to distal portion of this obtuse marginal there is a patent stent with 20% narrowing, and just distal to the stent there is an 80% thrombotic stenosis.  The RCA is  dominant containing 50% mid vessel stenosis, 70% distal stenosis, 70% ostial PDA stenosis, and total occlusion of the mid vessel that fills by collaterals from the left ventricular apex.  Left ventriculography is not performed.  Today's echo was reviewed and there is anteroapical severe hypokinesis and estimated ejection fraction of 40%.  LVEDP by Cath Lab hemodynamics is 40 mmHg.  RECOMMENDATIONS:   Acute on chronic combined systolic and diastolic heart failure with extremely elevated LVEDP greater than or equal to 40 mmHg related to global ischemia in the setting of norepinephrine therapy.  Recommend clopidogrel therapy if possible given stents in multiple territories and evidence of active thrombus beyond the stent in the obtuse marginal.  Current anatomy does not suggest that a role for coronary intervention.  35 cc of contrast used.  Given diabetes/shock state/intrinsic kidney disease, a bump in creatinine is still highly likely.  Kidney function needs to be monitored.  Patient Profile  73 y.o. female visiting from [place] admitted March 10 with fever, hypotension and found to have group A strep bacteremia requiring intubation.  Also ruled in for non-ST elevation myocardial infarction presently being treated medically. Blood cultures March 10 showed group A strep.  Respiratory virus panel showed rhinovirus.  Influenza A and B negative.  Coronavirus negative.  Note LV dysfunction possibly secondary to stress cardiomyopathy.  Assessment & Plan    1 paroxysmal atrial fibrillation-patient has converted to sinus rhythm.  Continue amiodarone.  Atrial fibrillation is likely related to hyperadrenergic state associated with acute illness.  She will likely not require this long-term.  2 NSTEMI/coronary artery disease-plan is for medical therapy.  Continue aspirin and Plavix.  Add statin prior to discharge.  3 cardiomyopathy-question ischemic versus stress-induced cardiomyopathy.  Continue  hydralazine/nitrates (not a candidate for ARB or Entresto given severity of renal insufficiency).  We will add beta-blocker later.  She will need a follow-up echocardiogram to reassess LV function as she improves.  4 acute combined systolic/diastolic congestive heart failure-we will continue milrinone for now.  Co-ox is improving.  Can likely discontinue in next 24 to 48 hours.  5 pneumonia-continue antibiotics.  6 ventilator dependent respiratory failure-vent wean per critical care medicine.  7 acute on chronc stage 3 kidney disease-follow renal function closely.  BUN/creatinine ratio continues to rise.  Hold further diuresis for now.  For questions or updates, please contact [place] Please consult [email or web address] for contact info under        Signed, Kirk Ruths, MD  02/17/2019, 9:15 AM

## 2019-02-18 ENCOUNTER — Inpatient Hospital Stay (HOSPITAL_COMMUNITY): Payer: Medicaid Other

## 2019-02-18 LAB — CULTURE, RESPIRATORY W GRAM STAIN

## 2019-02-18 LAB — BASIC METABOLIC PANEL
Anion gap: 10 (ref 5–15)
Anion gap: 12 (ref 5–15)
Anion gap: 15 (ref 5–15)
BUN: 110 mg/dL — ABNORMAL HIGH (ref 8–23)
BUN: 110 mg/dL — AB (ref 8–23)
BUN: 110 mg/dL — ABNORMAL HIGH (ref 8–23)
CO2: 26 mmol/L (ref 22–32)
CO2: 27 mmol/L (ref 22–32)
CO2: 26 mmol/L (ref 22–32)
Calcium: 8.8 mg/dL — ABNORMAL LOW (ref 8.9–10.3)
Calcium: 9 mg/dL (ref 8.9–10.3)
Calcium: 9.1 mg/dL (ref 8.9–10.3)
Chloride: 103 mmol/L (ref 98–111)
Chloride: 101 mmol/L (ref 98–111)
Chloride: 100 mmol/L (ref 98–111)
Creatinine, Ser: 1.95 mg/dL — ABNORMAL HIGH (ref 0.44–1.00)
Creatinine, Ser: 1.93 mg/dL — ABNORMAL HIGH (ref 0.44–1.00)
Creatinine, Ser: 2.08 mg/dL — ABNORMAL HIGH (ref 0.44–1.00)
GFR calc Af Amer: 29 mL/min — ABNORMAL LOW (ref >60)
GFR calc Af Amer: 29 mL/min — ABNORMAL LOW (ref >60)
GFR calc Af Amer: 27 mL/min — ABNORMAL LOW (ref >60)
GFR calc non Af Amer: 25 mL/min — ABNORMAL LOW (ref >60)
GFR calc non Af Amer: 25 mL/min — ABNORMAL LOW (ref >60)
GFR calc non Af Amer: 23 mL/min — ABNORMAL LOW (ref >60)
GLUCOSE: 270 mg/dL — AB (ref 70–99)
Glucose, Bld: 216 mg/dL — ABNORMAL HIGH (ref 70–99)
Glucose, Bld: 193 mg/dL — ABNORMAL HIGH (ref 70–99)
Potassium: 3.2 mmol/L — ABNORMAL LOW (ref 3.5–5.1)
Potassium: 3.1 mmol/L — ABNORMAL LOW (ref 3.5–5.1)
Potassium: 4.2 mmol/L (ref 3.5–5.1)
SODIUM: 140 mmol/L (ref 135–145)
Sodium: 139 mmol/L (ref 135–145)
Sodium: 141 mmol/L (ref 135–145)

## 2019-02-18 LAB — CBC
HCT: 28.4 % — ABNORMAL LOW (ref 36.0–46.0)
HEMOGLOBIN: 8.1 g/dL — AB (ref 12.0–15.0)
MCH: 24.8 pg — ABNORMAL LOW (ref 26.0–34.0)
MCHC: 28.5 g/dL — ABNORMAL LOW (ref 30.0–36.0)
MCV: 87.1 fL (ref 80.0–100.0)
Platelets: 383 10*3/uL (ref 150–400)
RBC: 3.26 MIL/uL — ABNORMAL LOW (ref 3.87–5.11)
RDW: 19.8 % — ABNORMAL HIGH (ref 11.5–15.5)
WBC: 13.3 10*3/uL — ABNORMAL HIGH (ref 4.0–10.5)
nRBC: 1.6 % — ABNORMAL HIGH (ref 0.0–0.2)

## 2019-02-18 LAB — CULTURE, RESPIRATORY

## 2019-02-18 LAB — GLUCOSE, CAPILLARY
GLUCOSE-CAPILLARY: 201 mg/dL — AB (ref 70–99)
Glucose-Capillary: 183 mg/dL — ABNORMAL HIGH (ref 70–99)
Glucose-Capillary: 174 mg/dL — ABNORMAL HIGH (ref 70–99)
Glucose-Capillary: 218 mg/dL — ABNORMAL HIGH (ref 70–99)
Glucose-Capillary: 214 mg/dL — ABNORMAL HIGH (ref 70–99)
Glucose-Capillary: 146 mg/dL — ABNORMAL HIGH (ref 70–99)

## 2019-02-18 LAB — COOXEMETRY PANEL
Carboxyhemoglobin: 1.4 % (ref 0.5–1.5)
Methemoglobin: 2 % — ABNORMAL HIGH (ref 0.0–1.5)
O2 Saturation: 60 %
TOTAL HEMOGLOBIN: 8.7 g/dL — AB (ref 12.0–16.0)

## 2019-02-18 MED ORDER — CIPROFLOXACIN IN D5W 400 MG/200ML IV SOLN
400.0000 mg | INTRAVENOUS | Status: DC
  Administered 2019-02-18: 400 mg via INTRAVENOUS
  Filled 2019-02-18 (×2): qty 200

## 2019-02-18 MED ORDER — FUROSEMIDE 10 MG/ML IJ SOLN
20.0000 mg | Freq: Two times a day (BID) | INTRAMUSCULAR | Status: DC
  Administered 2019-02-18: 20 mg via INTRAVENOUS

## 2019-02-18 MED ORDER — POTASSIUM CHLORIDE 10 MEQ/50ML IV SOLN
10.0000 meq | INTRAVENOUS | Status: AC
  Administered 2019-02-18 (×4): 10 meq via INTRAVENOUS
  Filled 2019-02-18 (×4): qty 50

## 2019-02-18 MED ORDER — CARVEDILOL 3.125 MG PO TABS
3.1250 mg | ORAL_TABLET | Freq: Two times a day (BID) | ORAL | Status: DC
  Administered 2019-02-18 – 2019-02-26 (×16): 3.125 mg via NASOGASTRIC
  Filled 2019-02-18 (×17): qty 1

## 2019-02-18 MED ORDER — AMIODARONE IV BOLUS ONLY 150 MG/100ML
150.0000 mg | Freq: Once | INTRAVENOUS | Status: AC
  Administered 2019-02-18: 150 mg via INTRAVENOUS
  Filled 2019-02-18: qty 100

## 2019-02-18 MED ORDER — MILRINONE LACTATE IN DEXTROSE 20-5 MG/100ML-% IV SOLN
0.1250 ug/kg/min | INTRAVENOUS | Status: DC
  Administered 2019-02-18 – 2019-02-22 (×8): 0.25 ug/kg/min via INTRAVENOUS
  Administered 2019-02-23: 0.125 ug/kg/min via INTRAVENOUS
  Administered 2019-02-23: 0.25 ug/kg/min via INTRAVENOUS
  Filled 2019-02-18 (×8): qty 100

## 2019-02-18 MED ORDER — FUROSEMIDE 10 MG/ML IJ SOLN
40.0000 mg | Freq: Two times a day (BID) | INTRAMUSCULAR | Status: AC
  Administered 2019-02-19: 40 mg via INTRAVENOUS
  Filled 2019-02-18 (×2): qty 4

## 2019-02-18 NOTE — Progress Notes (Addendum)
NAME:  Gabrielle Wright, MRN:  [id number], DOB:  1946-07-18, LOS: 14 ADMISSION DATE:  02/03/2019, CONSULTATION DATE: 02/04/2019 REFERRING MD: Emergency department physician cHIEF COMPLAINT: Fever respiratory failure  Brief History   73 year old elderly woman visiting from [place], admitted 3/10 with fevers, hypotension, found to have group A strep bacteremia, required mechanical ventilation Course complicated by non-STEMI and new LV dysfunction ? Ischemic vs Takatsubo's Failed extubation 3/18 due to acute pulmonary edema  Past Medical History  Hypertension Coronary artery disease Suspected diabetes review of her arterial consult receiving correct.  Significant Hospital IsEvents   02/04/2019 transfer from [facility] to [facility] required intubation and pressors. 3/16 off Levophed 3/18 extubated briefly but reintubated due to pulmonary edema 3/19 started milrinone 3/20 Episode of respiratory distress,  Precedex changed to propofol Atrial fibrillation RVR >>amiodarone bolus >> sinus rhythm 3/21 rising creat , AF-RVR >> amio bolus 3/24:rising creatinine,     Consults:  02/04/2019 ID   Procedures:  02/04/2019 intubation>> 3/18, 3/18 >> 02/04/2019 CVL>>  Significant Diagnostic Tests:  Echo 3/15 decreased EF 35 to 40%, LVEDP 27, no significant MR Cardiac cath >> LVEDP 40, RCA totally occluded, patent OM1 stent but distally occluded, apical LAD diffusely diseased, no intervention Chest x-ray 3/22  bilateral lower lobe airspace disease versus layering effusions CXR 3/23> pulmonary edema and bilateral pleural effusions. Personally reviewed   Micro Data:  02/04/2019 blood cultures x2>> Gr A strep 02/04/2019 sputum culture>> neg 02/04/2019 urine culture>>neg 02/04/2019 respiratory virus panel>> rhinovirus 02/04/2019 flu a and B>> neg coronavirus testing 02/04/2019>>neg resp 3/21 >> Klebsiella oxytoca, co-ag negative staph  Antimicrobials:  02/04/2019 vancomycin>>3/ 10  02/04/2019 Zosyn>> 3/10 Pen G 3/10 >> 3/22 (plan) ceftx 3/20 >>3/24 Cipro 3/24>>   Interim history/subjective:  Weaning milrinone Adding Coreg No mucus plugging over night Failed wean this am One run SVT, converted with suctioning Remains afebrile despite slight bump in WBC Creatinine continues to rise Co ox 60 3/24    Objective   Blood pressure (!) 125/59, pulse 98, temperature 97.7 F (36.5 C), temperature source Axillary, resp. rate 18, height 5\' 6"  (1.676 m), weight 79.4 kg, SpO2 97 %. CVP:  [0 mmHg-23 mmHg] 15 mmHg  Vent Mode: PRVC FiO2 (%):  [40 %] 40 % Set Rate:  [18 bmp] 18 bmp Vt Set:  [450 mL] 450 mL PEEP:  [5 cmH20] 5 cmH20 Plateau Pressure:  [18 cmH20-24 cmH20] 21 cmH20   Intake/Output Summary (Last 24 hours) at 02/18/2019 0856 Last data filed at 02/18/2019 0800 Gross per 24 hour  Intake 2384.2 ml  Output 1320 ml  Net 1064.2 ml   Filed Weights   02/15/19 0439 02/16/19 0455 02/17/19 0455  Weight: 83.2 kg 83 kg 79.4 kg   Examination: General: Elderly adult female, critically ill appearing, intubated, lightly sedated, currently unrestrained,  laying in bed in NAD, on full vent support at present Neuro: Sedated, RASS 1. Follows simple commands. PERRL. , MAE x 4 HEENT: NCAT. ETT OGT secure. Trachea midline. Pink mmm. Anicteric sclera  Pulm: Bilateral chest excursion, Diminished bibasilar breath sounds, clear with scant secretions Cardiovascualr: S1, S2, RRR, no r/g/m 2+ radial pulses  Abdomen: soft, round, ndnt, normoactive x 4, obese Extremities: Symmetrical bulk and tone, 2+ extremity edema, warm to touch Skin: Pale, clean, dry, warm, intact without rash or lesions  Resolved Hospital Problem list     Assessment & Plan:   Septic shock/group A strep bacteremia - Viral testing  negative for COVID, positive for rhino  -  Off pressors, changed from penicillin to ceftriaxone, plan was to stop 3/21 but leukocytosis concerning, continued until 3/23.  P -  Persistent leukocytosis despite recent abx course - afebrile -If new fever, re-culture -Will continue to trend CBC to evaluate WBC count   Acute respiratory failure -acute pulmonary edema -pleural effusions + 8200 cc CVP 15 Sputum Culture 3/21>> + for Klebsiella Oxytoca and rare satph Lugdenensis P - AM SBT/WUA -When extubation ready, extubate to BiPAP -Lasix 40 BID x 2 , dose daily renal function allows -Patient is net + 7L for admission, fluid overloaded on physical exam, CVP 18. Evaluate for diuresis tomorrow  -Titrate PEEP/FiO2 for SpO2 >92 - AM CXR - CXR prn - New sputum Cx results>> will add Cipro per sensiitivities >> Will need to follow QTc per EKG  Acute Kidney Injury -3/24 Cr 2.08  -etiology likely multifactorial-- Sepsis, ARB, diuresis Hyperkalemia - resolved Hyperphosphatemia Hypermagnesemia CVP 15 P -Lasix 20 BID -Continue to trend BMP - Diurese as renal function allows -Continue Strict I/O -Continue to evaluate CVP q4hr -Replete electrolytes PRN -Starting sevelamer  - Consider changing to nephro  - Avoid nephrotoxic agents - Maintain renal perfusion   Ischemic Cardiomyopathy/ LVEDP  - with no targets for revascularization on cath C0-ox 60% on 3/24 P - Continue ASA + Plavix -continue hydralazine/nitrates- bidil  - Decreased Milrinone per  Cards - check co-ox prn   Atrial Fibrillation with RVR Short run 3/24>> Resolved with suction P -continue Amiodarone  -Continue telemetry   Diabetes type 2/uncontrolled hyperglycemia -Was on high dose of 70/30 at home P -SSI resistant q 4h -Levemir 60 BID + EN coverage Novolog 8units q6hr + Resistant SSI q4  Abdominal distention/ Ileus -improved P Continue to minimize opioid administration Continue Miralax, PRN Senokot, PRN dulcolax  Acute encephalopathy-intermittent severe agitation, worsened by language barrier P -RASS goal 0 to -1 -Minimize sedating agents as able -Currently propofol gtt,  PRN  fentanyl, PRN versed   Acute Anemia -anemia of critical illness -no evidence of blood loss - s/p 1 PRBC 3/21, 1 PRBC 3/22 P -Continue to trend H/H on CBC -Goal Hgb >8 -Transfuse PRN for goal Hgb   GI ppx: Protonix VAP: Continue Pain/Sedation: propofol, fentanyl versed DVT ppx: Heparin SQ  Diet: Enteral Nutrition Code Status: full Family Communication: Family communication per   Critical Care Time: 40 minutes   Bevelyn Ngo,  MSN, AGACNP-BC [facility] [phone] If no answer, [phone] 02/18/2019, 8:56 AM

## 2019-02-18 NOTE — Progress Notes (Signed)
Progress Note  Patient Name: Gabrielle Wright Date of Encounter: 02/18/2019  Primary Cardiologist: Minus Breeding, MD   Subjective   Intubated; alert  Inpatient Medications    Scheduled Meds: . amiodarone  400 mg Per Tube BID  . aspirin  81 mg Oral Daily  . chlorhexidine gluconate (MEDLINE KIT)  15 mL Mouth Rinse BID  . Chlorhexidine Gluconate Cloth  6 each Topical Q0600  . clopidogrel  75 mg Oral Daily  . feeding supplement (PRO-STAT SUGAR FREE 64)  30 mL Per Tube QID  . feeding supplement (VITAL AF 1.2 CAL)  1,000 mL Per Tube Q24H  . furosemide  20 mg Intravenous Q12H  . heparin  5,000 Units Subcutaneous Q8H  . insulin aspart  0-24 Units Subcutaneous Q4H  . insulin aspart  10 Units Subcutaneous Q6H  . insulin detemir  60 Units Subcutaneous BID  . isosorbide-hydrALAZINE  1 tablet Oral TID  . mouth rinse  15 mL Mouth Rinse 10 times per day  . multivitamin  15 mL Per Tube Daily  . pantoprazole sodium  40 mg Per Tube QHS  . polyethylene glycol  17 g Oral Daily  . sodium chloride flush  10-40 mL Intracatheter Q12H   Continuous Infusions: . sodium chloride 10 mL/hr at 02/18/19 0800  . fentaNYL infusion INTRAVENOUS 50 mcg/hr (02/18/19 0800)  . milrinone 0.375 mcg/kg/min (02/18/19 0800)  . propofol (DIPRIVAN) infusion 10 mcg/kg/min (02/18/19 0800)   PRN Meds: acetaminophen (TYLENOL) oral liquid 160 mg/5 mL, acetaminophen, bisacodyl, dextrose, fentaNYL, fentaNYL (SUBLIMAZE) injection, fentaNYL (SUBLIMAZE) injection, hydrALAZINE, ipratropium-albuterol, midazolam, ondansetron (ZOFRAN) IV, sennosides, sodium chloride flush   Vital Signs    Vitals:   02/18/19 0700 02/18/19 0754 02/18/19 0800 02/18/19 0815  BP: (!) 97/56  (!) 125/59   Pulse: 76  89 98  Resp: 18  18 18   Temp:  97.7 F (36.5 C)    TempSrc:  Axillary    SpO2: 98%  96% 97%  Weight:      Height:        Intake/Output Summary (Last 24 hours) at 02/18/2019 0830 Last data filed at 02/18/2019 0800 Gross per 24 hour   Intake 2384.2 ml  Output 1320 ml  Net 1064.2 ml   Last 3 Weights 02/17/2019 02/16/2019 02/15/2019  Weight (lbs) 175 lb 0.7 oz 182 lb 15.7 oz 183 lb 6.8 oz  Weight (kg) 79.4 kg 83 kg 83.2 kg      Telemetry    Sinus with PAF - Personally Reviewed  Physical Exam   GEN: Intubated; alert Neck: No JVD Cardiac: regular and tachycardic; no murmur Respiratory: CTA anteriorly GI: Soft, NT/ND MS: trace edema Neuro:  Moves all ext   Labs    Chemistry Recent Labs  Lab 02/18/19 0051 02/18/19 0304 02/18/19 0738  NA 139 140 141  K 3.2* 3.1* 4.2  CL 103 101 100  CO2 26 27 26   GLUCOSE 270* 216* 193*  BUN 110* 110* 110*  CREATININE 1.95* 1.93* 2.08*  CALCIUM 8.8* 9.0 9.1  GFRNONAA 25* 25* 23*  GFRAA 29* 29* 27*  ANIONGAP 10 12 15      Hematology Recent Labs  Lab 02/15/19 0547 02/16/19 0435 02/17/19 0457  WBC 20.6* 17.3* 17.8*  RBC 3.11* 2.96* 3.29*  HGB 7.5* 7.0* 8.1*  HCT 26.2* 25.1* 27.9*  MCV 84.2 84.8 84.8  MCH 24.1* 23.6* 24.6*  MCHC 28.6* 27.9* 29.0*  RDW 19.5* 19.9* 19.3*  PLT 360 378 395    Cardiac Enzymes Recent Labs  Lab 02/14/19 0015  TROPONINI 1.96*    BNP Recent Labs  Lab 02/14/19 0015  BNP 807.3*    Radiology    Dg Chest Port 1 View  Result Date: 02/17/2019 CLINICAL DATA:  Acute respiratory failure EXAM: PORTABLE CHEST 1 VIEW COMPARISON:  Yesterday FINDINGS: Endotracheal tube tip just below the clavicular heads. Left subclavian line with tip at the right atrium. Gastric suction tube with tip over the stomach. Haziness of the lower chest attributed to layering pleural fluid. The lower lobes are partially obscured. Generalized interstitial opacity. No pneumothorax. Cardiomegaly.  Coronary stenting. IMPRESSION: 1. Stable hardware positioning. 2. Pulmonary edema and layering pleural effusions. Electronically Signed   By: Monte Fantasia M.D.   On: 02/17/2019 07:12    Cardiac Studies   Echocardiogram March 2020-  1. The left ventricle has  moderately reduced systolic function, with an ejection fraction of 35-40%. The cavity size was normal. Left ventricular diastolic Doppler parameters are consistent with pseudonormalization. Elevated left ventricular end-diastolic pressure The E/e' is 12. Regional wall motion abnormalities (see coded diagram below).  2. The right ventricle has mildly reduced systolic function. The cavity was normal. There is no increase in right ventricular wall thickness.  3. No hemodynamically significant valvular heart disease.  4. Normal biatrial chamber size.  5. IVC not well visualized.  Cardiac catheterization March 2020-  2nd Mrg-3 lesion is 80% stenosed.  Acute Mrg lesion is 90% stenosed.    Less than 25% left main  Proximal diffuse 30 to 40% narrowing within the previously placed LAD stent.  The entire apical LAD is diffusely diseased with up to 85% stenosis.  The first diagonal contains 85% ostial narrowing.  Circumflex contains diffuse proximal to mid eccentric 50% stenosis the dominant second obtuse marginal contains mid vessel 50% stenosis in the mid to distal portion of this obtuse marginal there is a patent stent with 20% narrowing, and just distal to the stent there is an 80% thrombotic stenosis.  The RCA is dominant containing 50% mid vessel stenosis, 70% distal stenosis, 70% ostial PDA stenosis, and total occlusion of the mid vessel that fills by collaterals from the left ventricular apex.  Left ventriculography is not performed.  Today's echo was reviewed and there is anteroapical severe hypokinesis and estimated ejection fraction of 40%.  LVEDP by Cath Lab hemodynamics is 40 mmHg.  RECOMMENDATIONS:   Acute on chronic combined systolic and diastolic heart failure with extremely elevated LVEDP greater than or equal to 40 mmHg related to global ischemia in the setting of norepinephrine therapy.  Recommend clopidogrel therapy if possible given stents in multiple territories and evidence  of active thrombus beyond the stent in the obtuse marginal.  Current anatomy does not suggest that a role for coronary intervention.  35 cc of contrast used.  Given diabetes/shock state/intrinsic kidney disease, a bump in creatinine is still highly likely.  Kidney function needs to be monitored.  Patient Profile     73 y.o. female visiting from [place] admitted March 10 with fever, hypotension and found to have group A strep bacteremia requiring intubation.  Also ruled in for non-ST elevation myocardial infarction presently being treated medically. Blood cultures March 10 showed group A strep.  Respiratory virus panel showed rhinovirus.  Influenza A and B negative.  Coronavirus negative.  Note LV dysfunction possibly secondary to stress cardiomyopathy.  Assessment & Plan    1 paroxysmal atrial fibrillation-patient with intermittent atrial fibrillation but in sinus at present. Continue amiodarone. Add coreg 3.125 mg  BID. Atrial fibrillation is likely related to hyperadrenergic state associated with acute illness. Hopefully will not require amiodarone long term once she improves from present illness.  2 NSTEMI/coronary artery disease-plan is for medical therapy.  Continue aspirin and Plavix.  Add statin prior to later.  3 cardiomyopathy-question ischemic versus stress-induced cardiomyopathy.  Continue hydralazine/nitrates (not a candidate for ARB or Entresto given severity of renal insufficiency).  Add coreg 3.125 mg BID. Titrate as tolerated. She will need a follow-up echocardiogram to reassess LV function as she improves.  4 acute combined systolic/diastolic congestive heart failure-Coox improving; wean milrinone to .25; DC next 24 hours.  5 pneumonia-continue antibiotics.  6 ventilator dependent respiratory failure-vent wean per critical care medicine.  7 acute on chronc stage 3 kidney disease-BUN/creatinine ratio continues to rise.  Would hold further diuresis for now.  For questions  or updates, please contact [place] Please consult [email or web address] for contact info under        Signed, Kirk Ruths, MD  02/18/2019, 8:30 AM

## 2019-02-18 NOTE — Progress Notes (Signed)
Physical Therapy Treatment Patient Details Name: Gabrielle Wright MRN: [id number] DOB: 09/16/1946 Today's Date: 02/18/2019    History of Present Illness 73 y.o. female admitted on 02/03/19 for fever and cough (to [facility]) and was transferred to [facility] on 02/04/19 for hypotension and respiratory distress (acute respiratory failure/pulmonary edema) requiring intubation 3/10-current (time of PT eval 02/11/19).  She was visiting family from [place] and COVID 19 tests were negative.  She was found to have group A strep bacteremia (septic shiock), rhinovirus,  and course complicated by NSTEMI and new LV dysfunction s/p cardiac cath on 02/09/19.  Cardiology following.  Other dx include ischemic cardiomyopathy, hyperkalemia, DM2- uncontrolled, and abdominal distention.  Pt with significant PMH of DM, HTN, anc CAD.    PT Comments    Pt was able to sit EOB for 20 mins today with VSS working on command following, sitting balance, tolerance of upright activity.  She tolerated well on PRVC 40% FiO2 and PEEP 5.  She was visibly fatigued and positioned in chair mode at the end of our session.  I am hopeful to attempt standing soon (possibly next session).    Follow Up Recommendations  Supervision/Assistance - 24 hour;CIR     Equipment Recommendations  Rolling walker with 5" wheels;Wheelchair (measurements PT);Wheelchair cushion (measurements PT);3in1 (PT)    Recommendations for Other Services Rehab consult     Precautions / Restrictions Precautions Precautions: Fall;Other (comment) Precaution Comments: ventilated, feeding tube (with ETT), restraints (wrist)    Mobility  Bed Mobility Overal bed mobility: Needs Assistance Bed Mobility: Supine to Sit;Sit to Supine     Supine to sit: +2 for physical assistance;HOB elevated;Max assist Sit to supine: +2 for physical assistance;Total assist;HOB elevated   General bed mobility comments: Two person max assist to come to sitting EOB.  Pt was able to  initiate some weak movement of her legs bil to EOB.           Balance Overall balance assessment: Needs assistance Sitting-balance support: Feet unsupported;Bilateral upper extremity supported Sitting balance-Leahy Scale: Poor Sitting balance - Comments: mod assist most days to sit EOB with bil upper extremity supported. Pt could sit ~10 seconds with close supervision, which is much improved over yesterday's session.  Pt sat EOB for 20 mins with VSS vent on PRVC 40% FiO2 and PEEP5, Pt was able to preform bil LE exercises and moved all extremities to command except squeezing her left hand (she would spontaneously move this arm).  Interpreter used throughout.                                     Cognition Arousal/Alertness: Lethargic Behavior During Therapy: Flat affect Overall Cognitive Status: Difficult to assess                                 General Comments: Pt able to follow 80% of basic commands today and sustain attention to a structured task LE exercises with cues).       Exercises General Exercises - Lower Extremity Long Arc Quad: AROM;Both;10 reps;AAROM        Pertinent Vitals/Pain Pain Assessment: Faces Faces Pain Scale: Hurts little more Pain Location: generalized Pain Descriptors / Indicators: Grimacing Pain Intervention(s): Limited activity within patient's tolerance;Monitored during session;Repositioned           PT Goals (current goals can now  be found in the care plan section) Acute Rehab PT Goals Patient Stated Goal: unable to state due to ETT Progress towards PT goals: Progressing toward goals    Frequency    Min 3X/week      PT Plan Current plan remains appropriate       AM-PAC PT "6 Clicks" Mobility   Outcome Measure  Help needed turning from your back to your side while in a flat bed without using bedrails?: Total Help needed moving from lying on your back to sitting on the side of a flat bed without using  bedrails?: Total Help needed moving to and from a bed to a chair (including a wheelchair)?: Total Help needed standing up from a chair using your arms (e.g., wheelchair or bedside chair)?: Total Help needed to walk in hospital room?: Total Help needed climbing 3-5 steps with a railing? : Total 6 Click Score: 6    End of Session Equipment Utilized During Treatment: Oxygen;Other (comment)(ventilator) Activity Tolerance: Patient limited by fatigue;Patient limited by lethargy Patient left: in bed;with call bell/phone within reach;with bed alarm set;with restraints reapplied(mittens re-applied) Nurse Communication: Other (comment)(RN in room ) PT Visit Diagnosis: Muscle weakness (generalized) (M62.81);Difficulty in walking, not elsewhere classified (R26.2)     Time: 9417-4081 PT Time Calculation (min) (ACUTE ONLY): 54 min  Charges:  $Therapeutic Exercise: 8-22 mins $Therapeutic Activity: 38-52 mins                    Tatia Petrucci B. Nima Bamburg, PT, DPT  Acute Rehabilitation [phone] pager #[phone] office   02/18/2019, 2:57 PM

## 2019-02-18 NOTE — Progress Notes (Signed)
eLink Physician-Brief Progress Note Patient Name: Gabrielle Wright DOB: 22-Aug-1946 MRN: [id number]   Date of Service  02/18/2019  HPI/Events of Note  Hypokalemia - K+ = 3.2 and Creatinine = 1.95.   eICU Interventions  Will order: 1. Replace K+. 2. Repeat BMP at 8 AM.     Intervention Category Major Interventions: Electrolyte abnormality - evaluation and management  Charlyne Robertshaw Eugene 02/18/2019, 2:50 AM

## 2019-02-18 NOTE — Progress Notes (Signed)
eLink Physician-Brief Progress Note Patient Name: Gabrielle Wright DOB: [birth year] MRN: [id number]   Date of Service  02/18/2019  HPI/Events of Note  Episode of AFIB with RVR which has spontaneously converted to NSR with rate = 92. Mg++ earlier today = 3.2, K+ 3.7 and Creatinine = 2.0. Patient just given Lasix IV.   eICU Interventions  Will order: 1. BMP STAT.     Intervention Category Major Interventions: Arrhythmia - evaluation and management  Sommer,Steven Dennard Nip 02/18/2019, 12:36 AM

## 2019-02-18 NOTE — Progress Notes (Signed)
eLink Physician-Brief Progress Note Patient Name: Gabrielle Wright DOB: 06/01/1946 MRN: [id number]   Date of Service  02/18/2019  HPI/Events of Note  AFIB with RVR - Back in AFIB with RVR at rate = 135-145.   eICU Interventions  Willorder: 1. Bolus with Amiodarone 150 mg IV over 10 minutes now.      Intervention Category Major Interventions: Arrhythmia - evaluation and management  Yamato Kopf Eugene 02/18/2019, 3:23 AM

## 2019-02-18 NOTE — Progress Notes (Signed)
Inpatient Diabetes Program Recommendations  AACE/ADA: New Consensus Statement on Inpatient Glycemic Control (2015)  Target Ranges:  Prepandial:   less than 140 mg/dL      Peak postprandial:   less than 180 mg/dL (1-2 hours)      Critically ill patients:  140 - 180 mg/dL   Lab Results  Component Value Date   GLUCAP 174 (H) 02/18/2019   HGBA1C 10.1 (H) 02/04/2019    Review of Glycemic Control Results for Gabrielle Wright, Gabrielle Wright (MRN [id number]) as of 02/18/2019 11:25  Ref. Range 02/17/2019 19:54 02/17/2019 23:30 02/18/2019 03:15 02/18/2019 05:33 02/18/2019 07:53  Glucose-Capillary Latest Ref Range: 70 - 99 mg/dL 416 (H) 384 (H) 536 (H) 183 (H) 174 (H)  Diabetes history: DM 2 Outpatient Diabetes medications:  Novolin 70/30 28-30 units bid, Humulin R- 10 units with lunch Current orders for Inpatient glycemic control:  Levemir 60 units bid, TCTS Correction q 4 hours, Novolog 10 units q 6 hours Inpatient Diabetes Program Recommendations:     Consider reducing Novolog tube feed coverage to 7 units but increase frequency of tube feed coverage to q 4 hours (to be given with correction).    Thanks,  Gabrielle Meager, RN, BC-ADM Inpatient Diabetes Coordinator Pager [phone] (8a-5p)

## 2019-02-18 NOTE — Progress Notes (Signed)
RN went to start pts bath and pt converted to afib RVR with HR in 120-140s. RN called [facility] and spoke with Dr. Arsenio Loader. Pt spontaneously converted back to NSR while on the phone with Dr. Arsenio Loader. Pt had another episode of afib RVR right after bath, yet pt converted back to NSR on her own with HR in the 90s. BMP stat ordered and obtained. Will check electrolytes and and continue to monitor closely.  Caswell Corwin, RN 02/18/19 1:00 AM

## 2019-02-18 NOTE — Progress Notes (Signed)
Pt back in afib with RVR, HR 130-140s. Notified Dr. Arsenio Loader with [facility]. Received orders for amio bolus. Will continue to monitor closely. Caswell Corwin, RN 02/18/19 3:53 AM

## 2019-02-19 ENCOUNTER — Inpatient Hospital Stay (HOSPITAL_COMMUNITY): Payer: Medicaid Other

## 2019-02-19 LAB — MAGNESIUM: Magnesium: 2.8 mg/dL — ABNORMAL HIGH (ref 1.7–2.4)

## 2019-02-19 LAB — BASIC METABOLIC PANEL
ANION GAP: 8 (ref 5–15)
Anion gap: 10 (ref 5–15)
BUN: 103 mg/dL — ABNORMAL HIGH (ref 8–23)
BUN: 99 mg/dL — AB (ref 8–23)
CO2: 27 mmol/L (ref 22–32)
CO2: 28 mmol/L (ref 22–32)
Calcium: 9.1 mg/dL (ref 8.9–10.3)
Calcium: 9.1 mg/dL (ref 8.9–10.3)
Chloride: 104 mmol/L (ref 98–111)
Chloride: 105 mmol/L (ref 98–111)
Creatinine, Ser: 1.58 mg/dL — ABNORMAL HIGH (ref 0.44–1.00)
Creatinine, Ser: 1.67 mg/dL — ABNORMAL HIGH (ref 0.44–1.00)
GFR calc Af Amer: 37 mL/min — ABNORMAL LOW (ref >60)
GFR calc non Af Amer: 30 mL/min — ABNORMAL LOW (ref >60)
GFR calc non Af Amer: 32 mL/min — ABNORMAL LOW (ref >60)
GFR, EST AFRICAN AMERICAN: 35 mL/min — AB (ref >60)
Glucose, Bld: 143 mg/dL — ABNORMAL HIGH (ref 70–99)
Glucose, Bld: 168 mg/dL — ABNORMAL HIGH (ref 70–99)
POTASSIUM: 3.4 mmol/L — AB (ref 3.5–5.1)
Potassium: 3.2 mmol/L — ABNORMAL LOW (ref 3.5–5.1)
Sodium: 140 mmol/L (ref 135–145)
Sodium: 142 mmol/L (ref 135–145)

## 2019-02-19 LAB — COOXEMETRY PANEL
Carboxyhemoglobin: 1.4 % (ref 0.5–1.5)
METHEMOGLOBIN: 1.2 % (ref 0.0–1.5)
O2 Saturation: 55.2 %
Total hemoglobin: 9.2 g/dL — ABNORMAL LOW (ref 12.0–16.0)

## 2019-02-19 LAB — CBC
HCT: 30.1 % — ABNORMAL LOW (ref 36.0–46.0)
Hemoglobin: 8.6 g/dL — ABNORMAL LOW (ref 12.0–15.0)
MCH: 24.6 pg — ABNORMAL LOW (ref 26.0–34.0)
MCHC: 28.6 g/dL — ABNORMAL LOW (ref 30.0–36.0)
MCV: 86.2 fL (ref 80.0–100.0)
Platelets: 423 10*3/uL — ABNORMAL HIGH (ref 150–400)
RBC: 3.49 MIL/uL — ABNORMAL LOW (ref 3.87–5.11)
RDW: 20.1 % — ABNORMAL HIGH (ref 11.5–15.5)
WBC: 11.5 10*3/uL — ABNORMAL HIGH (ref 4.0–10.5)
nRBC: 1.5 % — ABNORMAL HIGH (ref 0.0–0.2)

## 2019-02-19 LAB — GLUCOSE, CAPILLARY
GLUCOSE-CAPILLARY: 201 mg/dL — AB (ref 70–99)
Glucose-Capillary: 101 mg/dL — ABNORMAL HIGH (ref 70–99)
Glucose-Capillary: 116 mg/dL — ABNORMAL HIGH (ref 70–99)
Glucose-Capillary: 152 mg/dL — ABNORMAL HIGH (ref 70–99)
Glucose-Capillary: 268 mg/dL — ABNORMAL HIGH (ref 70–99)
Glucose-Capillary: 312 mg/dL — ABNORMAL HIGH (ref 70–99)

## 2019-02-19 LAB — PHOSPHORUS: Phosphorus: 4.9 mg/dL — ABNORMAL HIGH (ref 2.5–4.6)

## 2019-02-19 MED ORDER — POTASSIUM CHLORIDE 20 MEQ/15ML (10%) PO SOLN
40.0000 meq | Freq: Four times a day (QID) | ORAL | Status: AC
  Administered 2019-02-19 (×2): 40 meq via ORAL
  Filled 2019-02-19 (×2): qty 30

## 2019-02-19 MED ORDER — INSULIN ASPART 100 UNIT/ML ~~LOC~~ SOLN
7.0000 [IU] | SUBCUTANEOUS | Status: DC
  Administered 2019-02-19 – 2019-02-24 (×27): 7 [IU] via SUBCUTANEOUS

## 2019-02-19 MED ORDER — CHLORHEXIDINE GLUCONATE CLOTH 2 % EX PADS
6.0000 | MEDICATED_PAD | Freq: Every day | CUTANEOUS | Status: DC
  Administered 2019-02-19 – 2019-03-22 (×31): 6 via TOPICAL

## 2019-02-19 MED ORDER — CIPROFLOXACIN IN D5W 400 MG/200ML IV SOLN
400.0000 mg | Freq: Two times a day (BID) | INTRAVENOUS | Status: AC
  Administered 2019-02-19 – 2019-02-25 (×13): 400 mg via INTRAVENOUS
  Filled 2019-02-19 (×13): qty 200

## 2019-02-19 NOTE — Progress Notes (Addendum)
NAME:  Gabrielle Wright, MRN:  [id number], DOB:  Sep 15, 1946, LOS: 15 ADMISSION DATE:  02/03/2019, CONSULTATION DATE: 02/04/2019 REFERRING MD: Emergency department physician cHIEF COMPLAINT: Fever respiratory failure  Brief History   73 year old elderly woman visiting from [place], admitted 3/10 with fevers, hypotension, found to have group A strep bacteremia, required mechanical ventilation Course complicated by non-STEMI and new LV dysfunction ? Ischemic vs Takatsubo's Failed extubation 3/18 due to acute pulmonary edema  Past Medical History  Hypertension Coronary artery disease Suspected diabetes review of her arterial consult receiving correct.  Significant Hospital IsEvents   02/04/2019 transfer from [facility] to [facility] required intubation and pressors. 3/16 off Levophed 3/18 extubated briefly but reintubated due to pulmonary edema 3/19 started milrinone 3/20 Episode of respiratory distress,  Precedex changed to propofol Atrial fibrillation RVR >>amiodarone bolus >> sinus rhythm 3/21 rising creat , AF-RVR >> amio bolus 3/24:rising creatinine,     Consults:  02/04/2019 ID   Procedures:  02/04/2019 intubation>> 3/18, 3/18 >> 02/04/2019 CVL>>  Significant Diagnostic Tests:  Echo 3/15 decreased EF 35 to 40%, LVEDP 27, no significant MR Cardiac cath >> LVEDP 40, RCA totally occluded, patent OM1 stent but distally occluded, apical LAD diffusely diseased, no intervention Chest x-ray 3/22  bilateral lower lobe airspace disease versus layering effusions CXR 3/23> pulmonary edema and bilateral pleural effusions. Personally reviewed   Micro Data:  02/04/2019 blood cultures x2>> Gr A strep 02/04/2019 sputum culture>> neg 02/04/2019 urine culture>>neg 02/04/2019 respiratory virus panel>> rhinovirus 02/04/2019 flu a and B>> neg coronavirus testing 02/04/2019>>neg resp 3/21 >> Klebsiella oxytoca, co-ag negative staph  Antimicrobials:  02/04/2019 vancomycin>>3/ 10  02/04/2019 Zosyn>> 3/10 Pen G 3/10 >> 3/22 (plan) ceftx 3/20 >>3/24 Cipro 3/24>>   Interim history/subjective:  Vent wean x 2 hours this morning, now back on full support C/o LLQ abdominal cramping  BUN/Cr with slight improvement today  Objective   Blood pressure (!) 145/59, pulse 91, temperature 97.7 F (36.5 C), temperature source Oral, resp. rate (!) 23, height 5\' 6"  (1.676 m), weight 80.1 kg, SpO2 94 %. CVP:  [10 mmHg-18 mmHg] 10 mmHg  Vent Mode: PSV;CPAP FiO2 (%):  [40 %] 40 % Set Rate:  [18 bmp] 18 bmp Vt Set:  [450 mL] 450 mL PEEP:  [5 cmH20] 5 cmH20 Pressure Support:  [5 cmH20] 5 cmH20 Plateau Pressure:  [19 cmH20-24 cmH20] 22 cmH20   Intake/Output Summary (Last 24 hours) at 02/19/2019 1117 Last data filed at 02/19/2019 1000 Gross per 24 hour  Intake 1324.11 ml  Output 2425 ml  Net -1100.89 ml   Filed Weights   02/16/19 0455 02/17/19 0455 02/19/19 0500  Weight: 83 kg 79.4 kg 80.1 kg   Examination: General: Critically ill appearing adult female, intubated, laying in bed, NAD  Neuro: RASS 0. Follows commands. PERRL.  HEENT: NCAT ETT OGT secure anicteric sclera, pink mmm, trachea midline  Pulm: Symmetrical chest expansion, Diminished bibasilar breath sounds. No wheezing, no crackles.  Cardiovascualr: RRR s1s2, no r/g/m. No JVD 2+ radial pulses  Abdomen: Soft, round, mild tenderness LLQ, normoactive x4  Extremities: Symmetrical bulk and tone, 1+ BLE edema.  Skin: Clean, dry, warm intact without rash or lesions   Resolved Hospital Problem list   Hyperkalemia   Assessment & Plan:   Septic shock/group A strep bacteremia - Viral testing  negative for COVID, positive for rhino  -Off pressors, changed from penicillin to ceftriaxone, plan was to stop 3/21 but leukocytosis concerning, continued until 3/23.  -Improved leukocytosis  P - Continue to trend WBC, fever -Consider pan-cx if new fever and WBC count   Acute respiratory failure requiring mechanical ventilation  -acute pulmonary edema -pleural effusions -PNA  Sputum Culture 3/21>> + for Klebsiella Oxytoca and rare staph   Lugdenensis P - AM SBT/WUA -When extubation ready, extubate to BiPAP -Continue to diurese  -Titrate PEEP/FiO2 for SpO2 >92 - AM CXR - New sputum Cx results>> will add Cipro per sensiitivities >> Will need to follow QTc per EKG  Acute Kidney Injury -3/24 Cr 2.08  -etiology likely multifactorial-- Sepsis, ARB, diuresis Hyperphosphatemia Hypermagnesemia Hypokalemia in setting of diuresis  P -Lasix 40  BID -Continue to trend BMP -Continue Strict I/O -Continue to evaluate CVP q4hr -Replete electrolytes PRN - Avoid nephrotoxic agents - Maintain renal perfusion, MAP goal >65   Ischemic Cardiomyopathy/ LVEDP  - with no targets for revascularization on cath C0-ox 60% on 3/24 P - Cardiology following appreciate recs   -Continue ASA + Plavix - continue hydralazine/nitrates- bidil  - Maintain milrinone at current dose  - check co-ox prn   Atrial Fibrillation with RVR Short run 3/24>> Resolved with suction P -continue Amiodarone, coreg  -Continue telemetry   Diabetes type 2/uncontrolled hyperglycemia -Was on high dose of 70/30 at home P -SSI resistant q 4h -Levemir 60 BID + EN coverage Novolog 7units q4hr + Resistant SSI q4  Abdominal distention/ Ileus -improved P Continue to minimize opioid administration Continue Miralax, PRN Senokot, PRN dulcolax  Acute encephalopathy-intermittent severe agitation, worsened by language barrier P -RASS goal 0 to -1 -Minimize sedating agents as able -Currently propofol gtt,  Fentanyl gtt PRN fentanyl, PRN versed   Acute Anemia -anemia of critical illness -no evidence of blood loss - s/p 1 PRBC 3/21, 1 PRBC 3/22 P -Continue to trend H/H on CBC -Goal Hgb >8 -Transfuse PRN for goal Hgb   LLQ abdominal pain P -KUB     GI ppx: Protonix VAP: Continue Pain/Sedation: propofo, fentanyl versed DVT ppx: Heparin SQ   Diet: Enteral Nutrition Code Status: full Family Communication: none at bedside, patient updated via interpreter device   Critical Care Time: 40 minutes   Tessie Fass MSN, AGACNP-BC [facility] [phone] If no answer, [phone] 02/19/2019, 11:17 AM

## 2019-02-19 NOTE — Progress Notes (Signed)
Occupational Therapy Treatment Patient Details Name: Gabrielle Wright MRN: [id number] DOB: 01-01-1946 Today's Date: 02/19/2019    History of present illness 73 y.o. female admitted on 02/03/19 for fever and cough (to [facility]) and was transferred to [facility] on 02/04/19 for hypotension and respiratory distress (acute respiratory failure/pulmonary edema) requiring intubation 3/10-current (time of PT eval 02/11/19).  She was visiting family from [place] and COVID 19 tests were negative.  She was found to have group A strep bacteremia (septic shiock), rhinovirus,  and course complicated by NSTEMI and new LV dysfunction s/p cardiac cath on 02/09/19.  Cardiology following.  Other dx include ischemic cardiomyopathy, hyperkalemia, DM2- uncontrolled, and abdominal distention.  Pt with significant PMH of DM, HTN, anc CAD.   OT comments  Pt maxA+2 for bed mobility for trunk and LB. Pt requiring tactile and verbal cueing for proper technique. Pt performing sitting EOB x10 mins with intermittent assist. Pt not able to initiate ADL tasks. Pt attempting to assist with AAROM of  BUEs able to assist 2-to 2/5 MM grade. Pt performing LB kicking, but unable to continue motion more than a few reps. Pt following most commands from interpreter. Pt requires continued OT skilled services for ADL, mobility and safety in CIR setting. OT to follow acutely.     Interpreter: rhatu [id number].   Follow Up Recommendations  CIR;Supervision/Assistance - 24 hour    Equipment Recommendations  3 in 1 bedside commode    Recommendations for Other Services      Precautions / Restrictions Precautions Precautions: Fall;Other (comment) Restrictions Weight Bearing Restrictions: No       Mobility Bed Mobility Overal bed mobility: Needs Assistance Bed Mobility: Supine to Sit;Sit to Supine     Supine to sit: +2 for physical assistance;HOB elevated;Max assist Sit to supine: +2 for physical assistance;Total assist;HOB  elevated   General bed mobility comments: two person max A to come to EOB patient unable to stabilize herself with out varrying degrees of support. min guard to max A  Transfers                 General transfer comment: deferred    Balance Overall balance assessment: Needs assistance Sitting-balance support: Feet unsupported;Bilateral upper extremity supported Sitting balance-Leahy Scale: Poor Sitting balance - Comments: modA sitting EOB, with forward lean, pt able to complete all tasks.                                   ADL either performed or assessed with clinical judgement   ADL Overall ADL's : Needs assistance/impaired                                     Functional mobility during ADLs: Maximal assistance;+2 for physical assistance;+2 for safety/equipment(sitting EOB) General ADL Comments: pt unable to assist with UB ADL today. Pt reached for forehead. Pt followed 75% of commands from interpreter.     Vision       Perception     Praxis      Cognition Arousal/Alertness: Lethargic Behavior During Therapy: Flat affect Overall Cognitive Status: Difficult to assess                                 General Comments: Pt following cues and commands, lethargic  and sedated today closing eyes during visit        Exercises Exercises: Other exercises Other Exercises Other Exercises: PROM to BUEs    Shoulder Instructions       General Comments RN in room throughout task. Used interpreter: [id number] Rhatu.    Pertinent Vitals/ Pain       Pain Assessment: No/denies pain  Home Living                                          Prior Functioning/Environment              Frequency  Min 3X/week        Progress Toward Goals  OT Goals(current goals can now be found in the care plan section)     Acute Rehab OT Goals Patient Stated Goal: unable to state due to ETT OT Goal Formulation: With  family Time For Goal Achievement: 02/28/19 Potential to Achieve Goals: Good ADL Goals Pt Will Perform Grooming: with mod assist;sitting Pt/caregiver will Perform Home Exercise Program: Increased strength;Both right and left upper extremity;With minimal assist Additional ADL Goal #1: Pt will demonstrate fair sitting balance at EOB x 10 minutes. Additional ADL Goal #2: Pt will perform bed mobility with moderate assist in preparation for ADL. Additional ADL Goal #3: Pt will follow one step commands withing 5 seconds of request 75% of time.  Plan Discharge plan remains appropriate    Co-evaluation    PT/OT/SLP Co-Evaluation/Treatment: Yes Reason for Co-Treatment: For patient/therapist safety;To address functional/ADL transfers   OT goals addressed during session: ADL's and self-care      AM-PAC OT "6 Clicks" Daily Activity     Outcome Measure   Help from another person eating meals?: Total Help from another person taking care of personal grooming?: Total Help from another person toileting, which includes using toliet, bedpan, or urinal?: Total Help from another person bathing (including washing, rinsing, drying)?: Total Help from another person to put on and taking off regular upper body clothing?: Total Help from another person to put on and taking off regular lower body clothing?: Total 6 Click Score: 6    End of Session    OT Visit Diagnosis: Muscle weakness (generalized) (M62.81);Other symptoms and signs involving cognitive function   Activity Tolerance     Patient Left in bed;with bed alarm set;with nursing/sitter in room   Nurse Communication          Time: 1456-1520 OT Time Calculation (min): 24 min  Charges: OT General Charges $OT Visit: 1 Visit OT Treatments $Neuromuscular Re-education: 8-22 mins  Cristi Loron) Glendell Docker OTR/L Acute Rehabilitation Services Pager: [phone] Office: [phone]   Sandrea Hughs 02/19/2019, 5:02 PM

## 2019-02-19 NOTE — Progress Notes (Signed)
Physical Therapy Treatment Patient Details Name: Gabrielle Wright MRN: [id number] DOB: [birth year] Today's Date: 02/19/2019    History of Present Illness 73 y.o. female admitted on 02/03/19 for fever and cough (to [facility]) and was transferred to [facility] on 02/04/19 for hypotension and respiratory distress (acute respiratory failure/pulmonary edema) requiring intubation 3/10-current (time of PT eval 02/11/19).  She was visiting family from [place] and COVID 19 tests were negative.  She was found to have group A strep bacteremia (septic shiock), rhinovirus,  and course complicated by NSTEMI and new LV dysfunction s/p cardiac cath on 02/09/19.  Cardiology following.  Other dx include ischemic cardiomyopathy, hyperkalemia, DM2- uncontrolled, and abdominal distention.  Pt with significant PMH of DM, HTN, anc CAD.    PT Comments    Patient sedated this session, however tolerating sitting EOB for 20 minutes during visit. Pt requires varying degrees of support needed to balance EOB. Will hope to attempt standing with Stedy next visit. VSS during visit on 40% FiO2 vent.     Follow Up Recommendations  Supervision/Assistance - 24 hour;CIR     Equipment Recommendations  Rolling walker with 5" wheels;Wheelchair (measurements PT);Wheelchair cushion (measurements PT);3in1 (PT)    Recommendations for Other Services Rehab consult     Precautions / Restrictions Precautions Precautions: Fall;Other (comment) Restrictions Weight Bearing Restrictions: No    Mobility  Bed Mobility Overal bed mobility: Needs Assistance Bed Mobility: Supine to Sit;Sit to Supine     Supine to sit: +2 for physical assistance;HOB elevated;Max assist Sit to supine: +2 for physical assistance;Total assist;HOB elevated   General bed mobility comments: two person max A to come to EOB patient unable to stabilize herself with out varrying degrees of support. min guard to max A  Transfers                     Ambulation/Gait                 Stairs             Wheelchair Mobility    Modified Rankin (Stroke Patients Only)       Balance Overall balance assessment: Needs assistance Sitting-balance support: Feet unsupported;Bilateral upper extremity supported Sitting balance-Leahy Scale: Poor Sitting balance - Comments: mod assist most days to sit EOB with bil upper extremity supported. Pt could sit ~10 seconds with close supervision, which is much improved over yesterday's session.  Pt sat EOB for 20 mins with VSS vent on PRVC 40% FiO2 and PEEP5, Pt was able to preform bil LE exercises and moved all extremities to command except squeezing her left hand (she would spontaneously move this arm).  Interpreter used throughout.                                     Cognition Arousal/Alertness: Lethargic Behavior During Therapy: Flat affect Overall Cognitive Status: Difficult to assess                                 General Comments: Pt following cues and commands, lethargic and sedated today closing eyes during visit      Exercises      General Comments        Pertinent Vitals/Pain Pain Assessment: No/denies pain    Home Living  Prior Function            PT Goals (current goals can now be found in the care plan section) Acute Rehab PT Goals Patient Stated Goal: unable to state due to ETT PT Goal Formulation: Patient unable to participate in goal setting Time For Goal Achievement: 02/25/19 Potential to Achieve Goals: Good Progress towards PT goals: Progressing toward goals    Frequency    Min 3X/week      PT Plan Current plan remains appropriate    Co-evaluation PT/OT/SLP Co-Evaluation/Treatment: Yes            AM-PAC PT "6 Clicks" Mobility   Outcome Measure  Help needed turning from your back to your side while in a flat bed without using bedrails?: Total Help needed moving from lying  on your back to sitting on the side of a flat bed without using bedrails?: Total Help needed moving to and from a bed to a chair (including a wheelchair)?: Total Help needed standing up from a chair using your arms (e.g., wheelchair or bedside chair)?: Total Help needed to walk in hospital room?: Total Help needed climbing 3-5 steps with a railing? : Total 6 Click Score: 6    End of Session Equipment Utilized During Treatment: Oxygen;Other (comment)(vent) Activity Tolerance: Patient limited by fatigue;Patient limited by lethargy Patient left: in bed;with call bell/phone within reach;with bed alarm set;with restraints reapplied Nurse Communication: Other (comment) PT Visit Diagnosis: Muscle weakness (generalized) (M62.81);Difficulty in walking, not elsewhere classified (R26.2)     Time: 1191-4782 PT Time Calculation (min) (ACUTE ONLY): 37 min  Charges:  $Therapeutic Activity: 8-22 mins                     Etta Grandchild, PT, DPT Acute Rehabilitation Services Pager: [phone] Office: [phone]     Etta Grandchild 02/19/2019, 4:21 PM

## 2019-02-19 NOTE — Progress Notes (Signed)
PHARMACY NOTE:  ANTIMICROBIAL RENAL DOSAGE ADJUSTMENT  Current antimicrobial regimen includes a mismatch between antimicrobial dosage and estimated renal function.  As per policy approved by the Pharmacy & Therapeutics and Medical Executive Committees, the antimicrobial dosage will be adjusted accordingly.  Current antimicrobial dosage:  Ciprofloxacin 400 mg IV q24h  Indication: Kleb oxytoca/Staph Lugdunensis PNA   Renal Function:  Estimated Creatinine Clearance: 34.3 mL/min (A) (by C-G formula based on SCr of 1.58 mg/dL (H)). []      On intermittent HD, scheduled: []      On CRRT    Antimicrobial dosage has been changed to:  Ciprofloxacin 400 mg IV q12h  Additional comments: QTc this morning reported as 460 ms. Will continue to monitor. If QTc prolongation becomes a persistent issue while on cipro, could change to doxycycline 100 mg BID + ceftriaxone 2 g IV q24h.   Thank you for allowing pharmacy to be a part of this patient's care.  Arvilla Market, PharmD PGY1 Pharmacy Resident Phone [phone] 02/19/2019     11:22 AM

## 2019-02-19 NOTE — Care Management (Signed)
CM acknowledges recommendation for CIR however agency will not accept a pt requiring vent.  TOC will continue to follow

## 2019-02-19 NOTE — Progress Notes (Addendum)
Progress Note  Patient Name: Gabrielle Wright Date of Encounter: 02/19/2019  Primary Cardiologist: Minus Breeding, MD   Subjective   Intubated; remains alert  Inpatient Medications    Scheduled Meds:  amiodarone  400 mg Per Tube BID   aspirin  81 mg Oral Daily   carvedilol  3.125 mg Per NG tube BID WC   chlorhexidine gluconate (MEDLINE KIT)  15 mL Mouth Rinse BID   Chlorhexidine Gluconate Cloth  6 each Topical Q0600   clopidogrel  75 mg Oral Daily   feeding supplement (PRO-STAT SUGAR FREE 64)  30 mL Per Tube QID   feeding supplement (VITAL AF 1.2 CAL)  1,000 mL Per Tube Q24H   heparin  5,000 Units Subcutaneous Q8H   insulin aspart  0-24 Units Subcutaneous Q4H   insulin aspart  10 Units Subcutaneous Q6H   insulin detemir  60 Units Subcutaneous BID   isosorbide-hydrALAZINE  1 tablet Oral TID   mouth rinse  15 mL Mouth Rinse 10 times per day   multivitamin  15 mL Per Tube Daily   pantoprazole sodium  40 mg Per Tube QHS   polyethylene glycol  17 g Oral Daily   sodium chloride flush  10-40 mL Intracatheter Q12H   Continuous Infusions:  sodium chloride Stopped (02/19/19 0455)   ciprofloxacin 400 mg (02/18/19 1330)   fentaNYL infusion INTRAVENOUS 150 mcg/hr (02/19/19 0600)   milrinone 0.25 mcg/kg/min (02/19/19 0600)   propofol (DIPRIVAN) infusion 20 mcg/kg/min (02/19/19 0600)   PRN Meds: acetaminophen (TYLENOL) oral liquid 160 mg/5 mL, acetaminophen, bisacodyl, dextrose, fentaNYL, fentaNYL (SUBLIMAZE) injection, fentaNYL (SUBLIMAZE) injection, hydrALAZINE, ipratropium-albuterol, midazolam, ondansetron (ZOFRAN) IV, sennosides, sodium chloride flush   Vital Signs    Vitals:   02/19/19 0500 02/19/19 0600 02/19/19 0700 02/19/19 0729  BP: (!) 121/55 (!) 116/54  (!) 103/47  Pulse: 75 73  73  Resp: 18 18  18   Temp:   (!) 97.5 F (36.4 C)   TempSrc:   Oral   SpO2: 97% 98%  98%  Weight: 80.1 kg     Height:        Intake/Output Summary (Last 24 hours) at  02/19/2019 0848 Last data filed at 02/19/2019 0600 Gross per 24 hour  Intake 1310.7 ml  Output 2600 ml  Net -1289.3 ml   Last 3 Weights 02/19/2019 02/17/2019 02/16/2019  Weight (lbs) 176 lb 9.4 oz 175 lb 0.7 oz 182 lb 15.7 oz  Weight (kg) 80.1 kg 79.4 kg 83 kg      Telemetry    Sinus with PAF - Personally Reviewed  Physical Exam   GEN: Intubated; alert; moves ext Neck: supple Cardiac:  regular and tachycardic Respiratory: CTA anteriorly; no wheeze GI: Soft, NT/ND, no masses MS: no edema Neuro:  alert   Labs    Chemistry Recent Labs  Lab 02/18/19 0738 02/19/19 0017 02/19/19 0450  NA 141 140 142  K 4.2 3.4* 3.2*  CL 100 104 105  CO2 26 28 27   GLUCOSE 193* 168* 143*  BUN 110* 103* 99*  CREATININE 2.08* 1.67* 1.58*  CALCIUM 9.1 9.1 9.1  GFRNONAA 23* 30* 32*  GFRAA 27* 35* 37*  ANIONGAP 15 8 10      Hematology Recent Labs  Lab 02/17/19 0457 02/18/19 1000 02/19/19 0450  WBC 17.8* 13.3* 11.5*  RBC 3.29* 3.26* 3.49*  HGB 8.1* 8.1* 8.6*  HCT 27.9* 28.4* 30.1*  MCV 84.8 87.1 86.2  MCH 24.6* 24.8* 24.6*  MCHC 29.0* 28.5* 28.6*  RDW 19.3* 19.8*  20.1*  PLT 395 383 423*    Cardiac Enzymes Recent Labs  Lab 02/14/19 0015  TROPONINI 1.96*    BNP Recent Labs  Lab 02/14/19 0015  BNP 807.3*    Radiology    Dg Chest Port 1 View  Result Date: 02/19/2019 CLINICAL DATA:  Respiratory failure. EXAM: PORTABLE CHEST 1 VIEW COMPARISON:  One-view chest x-ray 02/18/2019 FINDINGS: Heart is enlarged. Aortic atherosclerosis is present. Endotracheal tube is stable, 3.5 cm above the carina. NG tube courses off the inferior border the film. Left subclavian line is stable. Interstitial edema and bilateral effusions are stable. Bibasilar airspace disease is increasing. IMPRESSION: 1. Congestive heart failure. 2. Increasing bibasilar airspace disease, likely atelectasis. Superimposed infection is not excluded. 3. Support apparatus is stable. 4. Aortic atherosclerosis.  Electronically Signed   By: San Morelle M.D.   On: 02/19/2019 07:55   Dg Chest Port 1 View  Result Date: 02/18/2019 CLINICAL DATA:  Respiratory failure EXAM: PORTABLE CHEST 1 VIEW COMPARISON:  Portable exam 0947 hours compared to 02/17/2019 FINDINGS: Tip of endotracheal tube 3.2 cm above carina. Nasogastric tube extends into stomach. LEFT subclavian central venous catheter with tip projecting over RIGHT atrium. Upper normal size of cardiac silhouette. Atherosclerotic calcification aorta. Diffuse BILATERAL pulmonary infiltrates increased since previous exam. Probable small RIGHT basilar effusion. No pneumothorax. Osseous structures appear demineralized. IMPRESSION: Increased BILATERAL pulmonary infiltrates with suspected small RIGHT pleural effusion. Electronically Signed   By: Lavonia Dana M.D.   On: 02/18/2019 10:05    Cardiac Studies   Echocardiogram March 2020-  1. The left ventricle has moderately reduced systolic function, with an ejection fraction of 35-40%. The cavity size was normal. Left ventricular diastolic Doppler parameters are consistent with pseudonormalization. Elevated left ventricular end-diastolic pressure The E/e' is 37. Regional wall motion abnormalities (see coded diagram below).  2. The right ventricle has mildly reduced systolic function. The cavity was normal. There is no increase in right ventricular wall thickness.  3. No hemodynamically significant valvular heart disease.  4. Normal biatrial chamber size.  5. IVC not well visualized.  Cardiac catheterization March 2020-  2nd Mrg-3 lesion is 80% stenosed.  Acute Mrg lesion is 90% stenosed.    Less than 25% left main  Proximal diffuse 30 to 40% narrowing within the previously placed LAD stent.  The entire apical LAD is diffusely diseased with up to 85% stenosis.  The first diagonal contains 85% ostial narrowing.  Circumflex contains diffuse proximal to mid eccentric 50% stenosis the dominant second obtuse  marginal contains mid vessel 50% stenosis in the mid to distal portion of this obtuse marginal there is a patent stent with 20% narrowing, and just distal to the stent there is an 80% thrombotic stenosis.  The RCA is dominant containing 50% mid vessel stenosis, 70% distal stenosis, 70% ostial PDA stenosis, and total occlusion of the mid vessel that fills by collaterals from the left ventricular apex.  Left ventriculography is not performed.  Today's echo was reviewed and there is anteroapical severe hypokinesis and estimated ejection fraction of 40%.  LVEDP by Cath Lab hemodynamics is 40 mmHg.  RECOMMENDATIONS:   Acute on chronic combined systolic and diastolic heart failure with extremely elevated LVEDP greater than or equal to 40 mmHg related to global ischemia in the setting of norepinephrine therapy.  Recommend clopidogrel therapy if possible given stents in multiple territories and evidence of active thrombus beyond the stent in the obtuse marginal.  Current anatomy does not suggest that a role for  coronary intervention.  35 cc of contrast used.  Given diabetes/shock state/intrinsic kidney disease, a bump in creatinine is still highly likely.  Kidney function needs to be monitored.  Patient Profile     73 y.o. female visiting from [place] admitted March 10 with fever, hypotension and found to have group A strep bacteremia requiring intubation.  Also ruled in for non-ST elevation myocardial infarction presently being treated medically. Blood cultures March 10 showed group A strep.  Respiratory virus panel showed rhinovirus.  Influenza A and B negative.  Coronavirus negative.  Note LV dysfunction possibly secondary to stress cardiomyopathy.  Assessment & Plan    1 paroxysmal atrial fibrillation-patient continues with paroxysmal atrial fibrillation.  Presently in sinus.  Continue amiodarone and carvedilol at present dose.  As outlined previously my hope is that her atrial fibrillation  is related to acute illness and long-term amiodarone will not be required.  Continue subcutaneous heparin.  Will likely need at least short-term anticoagulation for 3 to 6 months at discharge.    2 NSTEMI/coronary artery disease-plan is for medical therapy.  Continue aspirin and Plavix.  If she requires anticoagulation for atrial fibrillation at time of discharge will discontinue aspirin and continue Plavix.  I would like to avoid triple therapy.  Add statin prior to later.  3 cardiomyopathy-question ischemic versus stress-induced cardiomyopathy.  Continue hydralazine/nitrates (not a candidate for ARB or Entresto given severity of renal insufficiency).  Continue low-dose carvedilol.  Will not advance in the setting of acute CHF.  Will need a follow-up echocardiogram at some point to reassess LV function.  4 acute combined systolic/diastolic congestive heart failure-I/O - 1256. Coox 55. CVP 10.  Will not wean milrinone further at this point.  Her renal function has actually improved.  Continue Lasix at present dose.  5 pneumonia-continue antibiotics.  6 ventilator dependent respiratory failure-vent wean per critical care medicine.  7 acute on chronc stage 3 kidney disease-BUN/creatinine slightly improved today.  We will continue to follow.  For questions or updates, please contact [facility] Please consult [email or web address] for contact info under        Signed, Kirk Ruths, MD  02/19/2019, 8:48 AM

## 2019-02-20 ENCOUNTER — Inpatient Hospital Stay (HOSPITAL_COMMUNITY): Payer: Medicaid Other

## 2019-02-20 ENCOUNTER — Telehealth: Payer: Self-pay | Admitting: Cardiology

## 2019-02-20 DIAGNOSIS — N179 Acute kidney failure, unspecified: Secondary | ICD-10-CM

## 2019-02-20 DIAGNOSIS — I351 Nonrheumatic aortic (valve) insufficiency: Secondary | ICD-10-CM

## 2019-02-20 DIAGNOSIS — J9621 Acute and chronic respiratory failure with hypoxia: Secondary | ICD-10-CM

## 2019-02-20 LAB — BASIC METABOLIC PANEL
Anion gap: 11 (ref 5–15)
BUN: 95 mg/dL — ABNORMAL HIGH (ref 8–23)
CHLORIDE: 105 mmol/L (ref 98–111)
CO2: 28 mmol/L (ref 22–32)
Calcium: 9.2 mg/dL (ref 8.9–10.3)
Creatinine, Ser: 1.54 mg/dL — ABNORMAL HIGH (ref 0.44–1.00)
GFR calc Af Amer: 39 mL/min — ABNORMAL LOW (ref >60)
GFR calc non Af Amer: 33 mL/min — ABNORMAL LOW (ref >60)
Glucose, Bld: 189 mg/dL — ABNORMAL HIGH (ref 70–99)
POTASSIUM: 4 mmol/L (ref 3.5–5.1)
Sodium: 144 mmol/L (ref 135–145)

## 2019-02-20 LAB — COOXEMETRY PANEL
Carboxyhemoglobin: 1.5 % (ref 0.5–1.5)
Methemoglobin: 1.3 % (ref 0.0–1.5)
O2 Saturation: 51.4 %
Total hemoglobin: 9.4 g/dL — ABNORMAL LOW (ref 12.0–16.0)

## 2019-02-20 LAB — GLUCOSE, CAPILLARY
GLUCOSE-CAPILLARY: 183 mg/dL — AB (ref 70–99)
GLUCOSE-CAPILLARY: 180 mg/dL — AB (ref 70–99)
Glucose-Capillary: 147 mg/dL — ABNORMAL HIGH (ref 70–99)
Glucose-Capillary: 166 mg/dL — ABNORMAL HIGH (ref 70–99)
Glucose-Capillary: 186 mg/dL — ABNORMAL HIGH (ref 70–99)
Glucose-Capillary: 225 mg/dL — ABNORMAL HIGH (ref 70–99)

## 2019-02-20 LAB — TRIGLYCERIDES: Triglycerides: 118 mg/dL (ref <150)

## 2019-02-20 LAB — BRAIN NATRIURETIC PEPTIDE: B Natriuretic Peptide: 1046.8 pg/mL — ABNORMAL HIGH (ref 0.0–100.0)

## 2019-02-20 LAB — ECHOCARDIOGRAM LIMITED
Height: 66 in
Weight: 2790.14 oz

## 2019-02-20 MED ORDER — FUROSEMIDE 10 MG/ML IJ SOLN
40.0000 mg | Freq: Three times a day (TID) | INTRAMUSCULAR | Status: AC
  Administered 2019-02-20 – 2019-02-21 (×4): 40 mg via INTRAVENOUS
  Filled 2019-02-20 (×4): qty 4

## 2019-02-20 MED ORDER — POTASSIUM CHLORIDE 20 MEQ/15ML (10%) PO SOLN
40.0000 meq | Freq: Three times a day (TID) | ORAL | Status: AC
  Administered 2019-02-20 (×2): 40 meq
  Filled 2019-02-20 (×2): qty 30

## 2019-02-20 MED ORDER — POTASSIUM CHLORIDE 20 MEQ PO PACK
40.0000 meq | PACK | Freq: Three times a day (TID) | ORAL | Status: DC
  Filled 2019-02-20: qty 2

## 2019-02-20 MED ORDER — DIPHENHYDRAMINE HCL 25 MG PO CAPS
25.0000 mg | ORAL_CAPSULE | Freq: Four times a day (QID) | ORAL | Status: DC | PRN
  Administered 2019-02-20 – 2019-03-26 (×10): 25 mg via ORAL
  Filled 2019-02-20 (×11): qty 1

## 2019-02-20 NOTE — Telephone Encounter (Signed)
Family member would like to talk to Dr. Jens Som about the pt. Pt was seen by Dr. Jens Som today in the hospital and the family had questions for the cardiologist.

## 2019-02-20 NOTE — Progress Notes (Signed)
RT Note: Rt transported pt from 49M to 2H22 with RN w/out complications. RT will continue to monitor.

## 2019-02-20 NOTE — Progress Notes (Signed)
NAME:  Idaliz Pennacchio, MRN:  [id number], DOB:  02/25/1946, LOS: 16 ADMISSION DATE:  02/03/2019, CONSULTATION DATE: 02/04/2019 REFERRING MD: Emergency department physician cHIEF COMPLAINT: Fever respiratory failure  Brief History   73 year old elderly woman visiting from [place], admitted 3/10 with fevers, hypotension, found to have group A strep bacteremia, required mechanical ventilation Course complicated by non-STEMI and new LV dysfunction ? Ischemic vs Takatsubo's Failed extubation 3/18 due to acute pulmonary edema  Past Medical History  Hypertension Coronary artery disease Suspected diabetes review of her arterial consult receiving correct.  Significant Hospital IsEvents   02/04/2019 transfer from [facility] to [facility] required intubation and pressors. 3/16 off Levophed 3/18 extubated briefly but reintubated due to pulmonary edema 3/19 started milrinone 3/20 Episode of respiratory distress,  Precedex changed to propofol Atrial fibrillation RVR >>amiodarone bolus >> sinus rhythm 3/21 rising creat , AF-RVR >> amio bolus 3/24:rising creatinine,     Consults:  02/04/2019 ID   Procedures:  02/04/2019 intubation>> 3/18, 3/18 >> 02/04/2019 CVL>>  Significant Diagnostic Tests:  Echo 3/15 decreased EF 35 to 40%, LVEDP 27, no significant MR Cardiac cath >> LVEDP 40, RCA totally occluded, patent OM1 stent but distally occluded, apical LAD diffusely diseased, no intervention Chest x-ray 3/22  bilateral lower lobe airspace disease versus layering effusions CXR 3/23> pulmonary edema and bilateral pleural effusions. Personally reviewed   Micro Data:  02/04/2019 blood cultures x2>> Gr A strep 02/04/2019 sputum culture>> neg 02/04/2019 urine culture>>neg 02/04/2019 respiratory virus panel>> rhinovirus 02/04/2019 flu a and B>> neg coronavirus testing 02/04/2019>>neg resp 3/21 >> Klebsiella oxytoca, co-ag negative staph  Antimicrobials:  02/04/2019 vancomycin>>3/ 10  02/04/2019 Zosyn>> 3/10 Pen G 3/10 >> 3/22 (plan) ceftx 3/20 >>3/24 Cipro 3/24>>   Interim history/subjective:  Still unable to tolerate extended PSV Continued evidence for volume overload Remains on milrinone  Objective   Blood pressure (!) 147/66, pulse 91, temperature 98.1 F (36.7 C), temperature source Oral, resp. rate 18, height 5\' 6"  (1.676 m), weight 79.1 kg, SpO2 98 %. CVP:  [10 mmHg-19 mmHg] 17 mmHg  Vent Mode: PRVC FiO2 (%):  [40 %] 40 % Set Rate:  [18 bmp] 18 bmp Vt Set:  [450 mL] 450 mL PEEP:  [5 cmH20] 5 cmH20 Pressure Support:  [5 cmH20] 5 cmH20 Plateau Pressure:  [16 cmH20-22 cmH20] 22 cmH20   Intake/Output Summary (Last 24 hours) at 02/20/2019 9150 Last data filed at 02/20/2019 0800 Gross per 24 hour  Intake 1738.41 ml  Output 1940 ml  Net -201.59 ml   Filed Weights   02/17/19 0455 02/19/19 0500 02/20/19 0334  Weight: 79.4 kg 80.1 kg 79.1 kg   Examination: General: Ill-appearing woman, lying in bed, ventilated Neuro: Awake, alert, follows commands, attempting to communicate, pupils equal HEENT: ET tube in place, OG tube in place, oropharynx otherwise clear Pulm: Significantly decreased bilateral breath sounds without any wheezing Cardiovascualr: Regular, no murmur Abdomen: Soft, nondistended with positive bowel sounds Extremities: 1+ lower extremity edema Skin: No rash  Resolved Hospital Problem list   Hyperkalemia   Assessment & Plan:   Septic shock/group A strep bacteremia - Viral testing  negative for COVID, positive for rhino  -Off pressors, changed from penicillin to ceftriaxone, plan was to stop 3/21 but leukocytosis concerning, continued until 3/23.  -Improved leukocytosis  P Following, on Cipro as below for pulmonary process  Acute respiratory failure requiring mechanical ventilation -Pulmonary edema continues to be significant limiting factor -pleural effusions PNA Sputum Culture 3/21>> + for Klebsiella Oxytoca  and rare staph    Lugdenensis P SBT as able, doubt that we will be in a position to extubate until there is more volume removal When extubated likely immediately to BiPAP Follow chest x-ray Continue ciprofloxacin, covering Klebsiella, staph lugdunensis  Acute Kidney Injury -3/24 Cr 2.08  -etiology likely multifactorial-- Sepsis, ARB, diuresis Hyperphosphatemia Hypermagnesemia Hypokalemia in setting of diuresis  P Increase Lasix 3/26 Follow BMP, urine output, CVP Replace electrolytes as indicated   Ischemic Cardiomyopathy/ LVEDP  - with no targets for revascularization on cath C0-ox 60% on 3/24 P Patient Dr. Ludwig Clarks input.  Repeat echocardiogram planned for 3/26 Continue aspirin, Plavix Continue Bidil Milrinone at current dose and follow her oximetry  Atrial Fibrillation with RVR Short run 3/24>> Resolved with suction P Remains on amiodarone, carvedilol Continue telemetry   Diabetes type 2/uncontrolled hyperglycemia -Was on high dose of 70/30 at home P Resistant sliding scale insulin Levemir 60 twice daily with tube feed coverage  Abdominal distention/ Ileus -improved P Minimize narcotics Bowel regimen as ordered  Acute encephalopathy-intermittent severe agitation, worsened by language barrier P -RASS goal 0 to -1 Careful with sedating medications Fentanyl, propofol, Versed if needed  Acute Anemia -anemia of critical illness -no evidence of blood loss - s/p 1 PRBC 3/21, 1 PRBC 3/22 P Following CBC Goal Hgb > 8  LLQ abdominal pain P KUB reassuring, follow    GI ppx: Protonix VAP: Continue Pain/Sedation: propofol, fentanyl versed DVT ppx: Heparin SQ  Diet: Enteral Nutrition Code Status: full Family Communication: none at bedside, patient updated via interpreter device 3/25  Critical Care Time: 33 minutes   Levy Pupa, MD, PhD 02/20/2019, 9:54 AM [facility] [phone] or if no answer [phone]

## 2019-02-20 NOTE — Progress Notes (Signed)
PCCM Interval Note  I spoke with the patient's daughter by phone today. Explained to her the patient's current active issues, in particular her persistent pulmonary infiltrates, cardiac status.   I expressed my concern that her resp status has not changed significantly, that diuresis has been marginal despite milrinone, that a repeat TTE is pending today. She has now been ventilated since 02/04/2019. I explained that it is not clear to me how much more improvement there is to be made, that her mother may not achieve extubation. I have recommended that we consider the options of an attempted one-way extubation versus tracheostomy.   We will continue to discuss with her.   Levy Pupa, MD, PhD 02/20/2019, 5:07 PM [facility] [phone] or if no answer [phone]

## 2019-02-20 NOTE — Progress Notes (Signed)
eLink Physician-Brief Progress Note Patient Name: Gabrielle Wright DOB: 12/22/1945 MRN: [id number]   Date of Service  02/20/2019  HPI/Events of Note  Patient complaining of itching. No rash on RN exam x 2, second time with me observing. No new medications. Patient is on Milrinone but has been on it for days.  eICU Interventions  Oral Benadryl prn order entered        Migdalia Dk 02/20/2019, 12:13 AM

## 2019-02-20 NOTE — Progress Notes (Signed)
OT Cancellation Note  Patient Details Name: Gabrielle Wright MRN: [id number] DOB: 1946/02/25   Cancelled Treatment:    Reason Eval/Treat Not Completed: Other (comment)(Pt unable this AM due to BNP >1000 and unable to wean) Pt moving floors in PM and RN asking for pt to be seen tomorrow. Pt unable to wean successfully today.  Revonda Standard Cecil Cranker) Glendell Docker OTR/L Acute Rehabilitation Services Pager: [phone] Office: [phone]   Sandrea Hughs 02/20/2019, 2:31 PM

## 2019-02-20 NOTE — Progress Notes (Addendum)
Progress Note  Patient Name: Gabrielle Wright Date of Encounter: 02/20/2019  Primary Cardiologist: Minus Breeding, MD   Subjective   Intubated and alert  Inpatient Medications    Scheduled Meds:  amiodarone  400 mg Per Tube BID   aspirin  81 mg Oral Daily   carvedilol  3.125 mg Per NG tube BID WC   chlorhexidine gluconate (MEDLINE KIT)  15 mL Mouth Rinse BID   Chlorhexidine Gluconate Cloth  6 each Topical Q0600   clopidogrel  75 mg Oral Daily   feeding supplement (PRO-STAT SUGAR FREE 64)  30 mL Per Tube QID   feeding supplement (VITAL AF 1.2 CAL)  1,000 mL Per Tube Q24H   heparin  5,000 Units Subcutaneous Q8H   insulin aspart  0-24 Units Subcutaneous Q4H   insulin aspart  7 Units Subcutaneous Q4H   insulin detemir  60 Units Subcutaneous BID   isosorbide-hydrALAZINE  1 tablet Oral TID   mouth rinse  15 mL Mouth Rinse 10 times per day   multivitamin  15 mL Per Tube Daily   pantoprazole sodium  40 mg Per Tube QHS   polyethylene glycol  17 g Oral Daily   sodium chloride flush  10-40 mL Intracatheter Q12H   Continuous Infusions:  sodium chloride Stopped (02/20/19 0024)   ciprofloxacin Stopped (02/20/19 0126)   fentaNYL infusion INTRAVENOUS 250 mcg/hr (02/20/19 0600)   milrinone 0.25 mcg/kg/min (02/20/19 0600)   propofol (DIPRIVAN) infusion 5 mcg/kg/min (02/20/19 0600)   PRN Meds: acetaminophen (TYLENOL) oral liquid 160 mg/5 mL, acetaminophen, bisacodyl, dextrose, diphenhydrAMINE, fentaNYL, fentaNYL (SUBLIMAZE) injection, fentaNYL (SUBLIMAZE) injection, hydrALAZINE, ipratropium-albuterol, midazolam, ondansetron (ZOFRAN) IV, sennosides, sodium chloride flush   Vital Signs    Vitals:   02/20/19 0334 02/20/19 0400 02/20/19 0500 02/20/19 0600  BP:  (!) 147/63 136/62 (!) 120/53  Pulse:  87 86 82  Resp:  _0 Temp:  98.2 F (36.8 C)    TempSrc:  Oral    SpO2:  98% 98% 96%  Weight: 79.1 kg     Height:        Intake/Output Summary (Last 24 hours)  at 02/20/2019 0720 Last data filed at 02/20/2019 0600 Gross per 24 hour  Intake 1683.41 ml  Output 1885 ml  Net -201.59 ml   Last 3 Weights 02/20/2019 02/19/2019 02/17/2019  Weight (lbs) 174 lb 6.1 oz 176 lb 9.4 oz 175 lb 0.7 oz  Weight (kg) 79.1 kg 80.1 kg 79.4 kg      Telemetry    Sinus- Personally Reviewed  Physical Exam   GEN: Remains intubated; opens eyes Neck: JVP difficult to assess Cardiac:  RRR, no murmur Respiratory: CTA; no wheeze GI: Soft, mild tenderness to palpation MS: trace edema Neuro:  alert; moves all ext   Labs    Chemistry Recent Labs  Lab 02/18/19 0738 02/19/19 0017 02/19/19 0450  NA 141 140 142  K 4.2 3.4* 3.2*  CL 100 104 105  CO2 _1 GLUCOSE 193* 168* 143*  BUN 110* 103* 99*  CREATININE 2.08* 1.67* 1.58*  CALCIUM 9.1 9.1 9.1  GFRNONAA 23* 30* 32*  GFRAA 27* 35* 37*  ANIONGAP _2 Hematology Recent Labs  Lab 02/17/19 0457 02/18/19 1000 02/19/19 0450  WBC 17.8* 13.3* 11.5*  RBC 3.29* 3.26* 3.49*  HGB 8.1* 8.1* 8.6*  HCT 27.9* 28.4* 30.1*  MCV 84.8 87.1 86.2  MCH 24.6* 24.8* 24.6*  MCHC 29.0* 28.5* 28.6*  RDW  19.8* 20.1*  °PLT 395 383 423*  ° ° °Cardiac Enzymes °Recent Labs  °Lab 02/14/19 °0015  °TROPONINI 1.96*  ° ° °BNP °Recent Labs  °Lab 02/14/19 °0015  °BNP 807.3*  ° ° °Radiology  °  °Dg Abd 1 View ° °Result Date: 02/19/2019 °CLINICAL DATA:  Abdominal distention. EXAM: ABDOMEN - 1 VIEW COMPARISON:  Abdominal x-ray dated February 10, 2019. FINDINGS: Unchanged enteric tube in the stomach. Normal bowel gas pattern. No acute osseous abnormality. IMPRESSION: Negative. Electronically Signed   By: William T Derry M.D.   On: 02/19/2019 13:29  ° °Dg Chest Port 1 View ° °Result Date: 02/19/2019 °CLINICAL DATA:  Respiratory failure. EXAM: PORTABLE CHEST 1 VIEW COMPARISON:  One-view chest x-ray 02/18/2019 FINDINGS: Heart is enlarged. Aortic atherosclerosis is present. Endotracheal tube is stable, 3.5 cm above the carina. NG tube  courses off the inferior border the film. Left subclavian line is stable. Interstitial edema and bilateral effusions are stable. Bibasilar airspace disease is increasing. IMPRESSION: 1. Congestive heart failure. 2. Increasing bibasilar airspace disease, likely atelectasis. Superimposed infection is not excluded. 3. Support apparatus is stable. 4. Aortic atherosclerosis. Electronically Signed   By: Christopher  Mattern M.D.   On: 02/19/2019 07:55  ° °Dg Chest Port 1 View ° °Result Date: 02/18/2019 °CLINICAL DATA:  Respiratory failure EXAM: PORTABLE CHEST 1 VIEW COMPARISON:  Portable exam 0947 hours compared to 02/17/2019 FINDINGS: Tip of endotracheal tube 3.2 cm above carina. Nasogastric tube extends into stomach. LEFT subclavian central venous catheter with tip projecting over RIGHT atrium. Upper normal size of cardiac silhouette. Atherosclerotic calcification aorta. Diffuse BILATERAL pulmonary infiltrates increased since previous exam. Probable small RIGHT basilar effusion. No pneumothorax. Osseous structures appear demineralized. IMPRESSION: Increased BILATERAL pulmonary infiltrates with suspected small RIGHT pleural effusion. Electronically Signed   By: Mark  Boles M.D.   On: 02/18/2019 10:05  ° ° °Cardiac Studies  ° °Echocardiogram March 2020-  °1. The left ventricle has moderately reduced systolic function, with an ejection fraction of 35-40%. The cavity size was normal. Left ventricular diastolic Doppler parameters are consistent with pseudonormalization. Elevated left ventricular end-diastolic pressure The E/e' is 27. Regional wall motion abnormalities (see coded diagram below). ° 2. The right ventricle has mildly reduced systolic function. The cavity was normal. There is no increase in right ventricular wall thickness. ° 3. No hemodynamically significant valvular heart disease. ° 4. Normal biatrial chamber size. ° 5. IVC not well visualized. ° °Cardiac catheterization March 2020- °· 2nd Mrg-3 lesion is 80%  stenosed. °· Acute Mrg lesion is 90% stenosed. °  °· Less than 25% left main °· Proximal diffuse 30 to 40% narrowing within the previously placed LAD stent.  The entire apical LAD is diffusely diseased with up to 85% stenosis.  The first diagonal contains 85% ostial narrowing. °· Circumflex contains diffuse proximal to mid eccentric 50% stenosis the dominant second obtuse marginal contains mid vessel 50% stenosis in the mid to distal portion of this obtuse marginal there is a patent stent with 20% narrowing, and just distal to the stent there is an 80% thrombotic stenosis. °· The RCA is dominant containing 50% mid vessel stenosis, 70% distal stenosis, 70% ostial PDA stenosis, and total occlusion of the mid vessel that fills by collaterals from the left ventricular apex. °· Left ventriculography is not performed.  Today's echo was reviewed and there is anteroapical severe hypokinesis and estimated ejection fraction of 40%.  LVEDP by Cath Lab hemodynamics is 40 mmHg. °  °RECOMMENDATIONS: °  °· Acute on chronic   combined systolic and diastolic heart failure with extremely elevated LVEDP greater than or equal to 40 mmHg related to global ischemia in the setting of norepinephrine therapy. °· Recommend clopidogrel therapy if possible given stents in multiple territories and evidence of active thrombus beyond the stent in the obtuse marginal. °· Current anatomy does not suggest that a role for coronary intervention. °· 35 cc of contrast used.  Given diabetes/shock state/intrinsic kidney disease, a bump in creatinine is still highly likely.  Kidney function needs to be monitored. ° °Patient Profile  °   °73 y.o. female visiting from [place] admitted March 10 with fever, hypotension and found to have group A strep bacteremia requiring intubation.  Also ruled in for non-ST elevation myocardial infarction presently being treated medically. Blood cultures March 10 showed group A strep.  Respiratory virus panel showed  rhinovirus.  Influenza A and B negative.  Coronavirus negative.  Note LV dysfunction possibly secondary to stress cardiomyopathy. ° °Assessment & Plan  °  °1 paroxysmal atrial fibrillation-Remains in sinus. Continue amiodarone and low dose coreg. As outlined previously my hope is that her atrial fibrillation is related to acute illness and long-term amiodarone will not be required.  Continue subcutaneous heparin.   ° °2 NSTEMI/coronary artery disease-plan is for medical therapy.  Continue aspirin and Plavix.  Add statin when extubated. ° °3 cardiomyopathy-question ischemic versus stress-induced cardiomyopathy.  Continue hydralazine/nitrates (not a candidate for ARB or Entresto given severity of renal insufficiency).  Continue low-dose carvedilol.  Will not advance in the setting of acute CHF.  Will arrange limited FU echo for LV function (if stress induced CM, hopefully will have improved. ° °4 acute combined systolic/diastolic congestive heart failure-I/O - 201. Coox 51. CVP 15.  Will not wean milrinone further at this point.  Await BMET. Resume lasix 40 mg IV BID. ° °5 pneumonia-antibiotics per CCM. ° °6 ventilator dependent respiratory failure-vent wean per critical care medicine. ° °7 acute on chronc stage 3 kidney disease-BMET pending; continue to follow. ° °For questions or updates, please contact [facility] °Please consult [email or web address] for contact info under  ° °  °   °Signed, ° , MD  °02/20/2019, 7:20 AM   °Addendum-I received a message today that patient's family wanted to discuss patient's heart condition.  I contacted the patient's son-in-law and reviewed her course from a cardiac standpoint.  All questions were answered.  We discussed that she has moderate LV dysfunction and is receiving medications (presently on milrinone, BiDil and low-dose Coreg).  She is also being diuresed for pulmonary edema.  Critical care is attempting vent wean. ° , MD ° °

## 2019-02-20 NOTE — Telephone Encounter (Signed)
RETURNED CALL TO nupaira jahan (DAUGHTER) AND HER HUSBAND Imaruhul Chowvhury. HE STATES THAT PT IS IN CRITICAL CONDITION AT THE HOSPITAL AND HE STATES THAT HE WOULD LIKE TO TAKE TO DR CRENSHAW. HE CALLED THE HOSPITAL TO SEE THAT BC WOULD BE THERE AND THEY TOLD HIM TO CALL OUR OFFICE AND LET us KNOW THAT THE FAMILY WANTED TO SPEAK WITH CRENSHAW AND WE WOULD LET HIM KNOW BECAUSE HE HAS LEFT THE HOSPITAL AT THIS TIME. FAMILY WOULD LIKE TO "KNOW WHAT THE PLAN IS GOING FORWARD TO MANAGE PT's HEART CONDITION"?

## 2019-02-20 NOTE — Progress Notes (Signed)
  Echocardiogram 2D Echocardiogram has been performed.  Gabrielle Wright 02/20/2019, 8:31 AM

## 2019-02-21 ENCOUNTER — Inpatient Hospital Stay (HOSPITAL_COMMUNITY): Payer: Medicaid Other

## 2019-02-21 LAB — COOXEMETRY PANEL
Carboxyhemoglobin: 1.8 % — ABNORMAL HIGH (ref 0.5–1.5)
Methemoglobin: 1.3 % (ref 0.0–1.5)
O2 Saturation: 57.7 %
Total hemoglobin: 9.4 g/dL — ABNORMAL LOW (ref 12.0–16.0)

## 2019-02-21 LAB — BASIC METABOLIC PANEL
Anion gap: 10 (ref 5–15)
BUN: 83 mg/dL — ABNORMAL HIGH (ref 8–23)
CO2: 31 mmol/L (ref 22–32)
Calcium: 9.8 mg/dL (ref 8.9–10.3)
Chloride: 108 mmol/L (ref 98–111)
Creatinine, Ser: 1.37 mg/dL — ABNORMAL HIGH (ref 0.44–1.00)
GFR calc non Af Amer: 38 mL/min — ABNORMAL LOW (ref >60)
GFR, EST AFRICAN AMERICAN: 45 mL/min — AB (ref >60)
Glucose, Bld: 221 mg/dL — ABNORMAL HIGH (ref 70–99)
Potassium: 4.5 mmol/L (ref 3.5–5.1)
Sodium: 149 mmol/L — ABNORMAL HIGH (ref 135–145)

## 2019-02-21 LAB — GLUCOSE, CAPILLARY
GLUCOSE-CAPILLARY: 192 mg/dL — AB (ref 70–99)
GLUCOSE-CAPILLARY: 211 mg/dL — AB (ref 70–99)
GLUCOSE-CAPILLARY: 220 mg/dL — AB (ref 70–99)
Glucose-Capillary: 212 mg/dL — ABNORMAL HIGH (ref 70–99)
Glucose-Capillary: 213 mg/dL — ABNORMAL HIGH (ref 70–99)
Glucose-Capillary: 118 mg/dL — ABNORMAL HIGH (ref 70–99)

## 2019-02-21 LAB — CBC
HEMATOCRIT: 33.2 % — AB (ref 36.0–46.0)
Hemoglobin: 9.5 g/dL — ABNORMAL LOW (ref 12.0–15.0)
MCH: 25.2 pg — ABNORMAL LOW (ref 26.0–34.0)
MCHC: 28.6 g/dL — ABNORMAL LOW (ref 30.0–36.0)
MCV: 88.1 fL (ref 80.0–100.0)
NRBC: 0.2 % (ref 0.0–0.2)
Platelets: 503 10*3/uL — ABNORMAL HIGH (ref 150–400)
RBC: 3.77 MIL/uL — ABNORMAL LOW (ref 3.87–5.11)
RDW: 20.7 % — ABNORMAL HIGH (ref 11.5–15.5)
WBC: 12.9 10*3/uL — ABNORMAL HIGH (ref 4.0–10.5)

## 2019-02-21 LAB — BRAIN NATRIURETIC PEPTIDE: B Natriuretic Peptide: 935.7 pg/mL — ABNORMAL HIGH (ref 0.0–100.0)

## 2019-02-21 MED ORDER — FUROSEMIDE 10 MG/ML IJ SOLN
40.0000 mg | Freq: Two times a day (BID) | INTRAMUSCULAR | Status: DC
  Administered 2019-02-21 – 2019-02-26 (×9): 40 mg via INTRAVENOUS
  Filled 2019-02-21 (×8): qty 4

## 2019-02-21 MED ORDER — PRO-STAT SUGAR FREE PO LIQD
30.0000 mL | Freq: Two times a day (BID) | ORAL | Status: DC
  Administered 2019-02-21 – 2019-02-24 (×6): 30 mL
  Filled 2019-02-21 (×6): qty 30

## 2019-02-21 MED ORDER — INSULIN DETEMIR 100 UNIT/ML ~~LOC~~ SOLN
65.0000 [IU] | Freq: Two times a day (BID) | SUBCUTANEOUS | Status: DC
  Administered 2019-02-22 – 2019-02-24 (×6): 65 [IU] via SUBCUTANEOUS
  Filled 2019-02-21 (×9): qty 0.65

## 2019-02-21 MED ORDER — VITAL AF 1.2 CAL PO LIQD
1000.0000 mL | ORAL | Status: DC
  Administered 2019-02-21 – 2019-02-23 (×3): 1000 mL

## 2019-02-21 MED ORDER — FREE WATER
250.0000 mL | Freq: Four times a day (QID) | Status: DC
  Administered 2019-02-21 – 2019-02-24 (×13): 250 mL

## 2019-02-21 NOTE — Progress Notes (Signed)
Nutrition Follow-up  DOCUMENTATION CODES:   Not applicable  INTERVENTION:   If family proceeds with trach recommend Cortrak placement Monday.   Tube feeding:  -Vital AF 1.2 @ 45 ml/hr (1080 ml) via OGT -30 ml Prostat BID -Free water 250 ml TID per MD  Provides: 1496 kcals, 111 grams protein, 876 ml free water (1626 ml with flushes). Meets 104% of kcal needs and 100% of protein needs.   NUTRITION DIAGNOSIS:   Inadequate oral intake related to inability to eat as evidenced by NPO status.  Ongoing  GOAL:   Patient will meet greater than or equal to 90% of their needs  Met with TF  MONITOR:   Vent status, TF tolerance, Labs, I & O's  REASON FOR ASSESSMENT:   Consult Enteral/tube feeding initiation and management  ASSESSMENT:   72 yo female with PMH of DM-2, HTN, CAD from [place] who was admitted with fever and respiratory failure requiring intubation. Admission complicated by NSTEMI and new LV dysfunction.   3/18- failed extubation   RD working remotely.  COVID test negative. Spoke with RN. Family to make decision regarding trach placement or one extubation by today or tomorrow. Pt tolerating Vital 1.2 AF @ 30 ml/hr. RD to adjust tube feeding now that sedation has been d/c.   Pt will likely need Cortrak placed if family proceeds with trach.   Weight noted to increase from 175 lb on 3/23 to 183 lb today. Will utilize EDW of 79.1 kg to estimate needs.   Patient is currently intubated on ventilator support MV: 8.3 L/min Temp (24hrs), Avg:98.1 F (36.7 C), Min:97.7 F (36.5 C), Max:98.5 F (36.9 C)  Propofol: d/c  I/O: - 3675 ml since admit UOP: 3375 ml x 24 hrs Rectal tube: 200 ml x 24 hrs   Medications reviewed and include: 40 mg lasix BID, SS novolog, levemir, liquid MVI, miralax, milrinone Labs reviewed: Na 149 (H) CBG 180-220 Phosphorus 4.9-trending down  Diet Order:   Diet Order            Diet NPO time specified  Diet effective now               EDUCATION NEEDS:   No education needs have been identified at this time  Skin:  Skin Assessment: Reviewed RN Assessment  Last BM:  3/27- rectal tube 200 ml x 24hrs   Height:   Ht Readings from Last 1 Encounters:  02/04/19 5' 6" (1.676 m)    Weight:   Wt Readings from Last 1 Encounters:  02/21/19 82.8 kg    Ideal Body Weight:  59.1 kg  BMI:  Body mass index is 29.46 kg/m.  Estimated Nutritional Needs:   Kcal:  1439 kcal  Protein:  110-120 grams  Fluid:  >/= 1.5 L/day   Mariana Single RD, LDN Clinical Nutrition Pager # - [phone]

## 2019-02-21 NOTE — Progress Notes (Signed)
Progress Note  Patient Name: Gabrielle Wright Date of Encounter: 02/21/2019  Primary Cardiologist: Minus Breeding, MD   Subjective   Intubated; opens eyes  Inpatient Medications    Scheduled Meds:  amiodarone  400 mg Per Tube BID   aspirin  81 mg Oral Daily   carvedilol  3.125 mg Per NG tube BID WC   chlorhexidine gluconate (MEDLINE KIT)  15 mL Mouth Rinse BID   Chlorhexidine Gluconate Cloth  6 each Topical Q0600   clopidogrel  75 mg Oral Daily   feeding supplement (PRO-STAT SUGAR FREE 64)  30 mL Per Tube QID   feeding supplement (VITAL AF 1.2 CAL)  1,000 mL Per Tube Q24H   free water  250 mL Per Tube Q6H   heparin  5,000 Units Subcutaneous Q8H   insulin aspart  0-24 Units Subcutaneous Q4H   insulin aspart  7 Units Subcutaneous Q4H   insulin detemir  60 Units Subcutaneous BID   isosorbide-hydrALAZINE  1 tablet Oral TID   mouth rinse  15 mL Mouth Rinse 10 times per day   multivitamin  15 mL Per Tube Daily   pantoprazole sodium  40 mg Per Tube QHS   polyethylene glycol  17 g Oral Daily   sodium chloride flush  10-40 mL Intracatheter Q12H   Continuous Infusions:  sodium chloride Stopped (02/21/19 0828)   ciprofloxacin Stopped (02/21/19 0121)   fentaNYL infusion INTRAVENOUS 200 mcg/hr (02/21/19 1000)   milrinone 0.25 mcg/kg/min (02/21/19 1000)   propofol (DIPRIVAN) infusion Stopped (02/20/19 0705)   PRN Meds: acetaminophen (TYLENOL) oral liquid 160 mg/5 mL, acetaminophen, bisacodyl, dextrose, diphenhydrAMINE, fentaNYL, fentaNYL (SUBLIMAZE) injection, fentaNYL (SUBLIMAZE) injection, hydrALAZINE, ipratropium-albuterol, midazolam, ondansetron (ZOFRAN) IV, sennosides, sodium chloride flush   Vital Signs    Vitals:   02/21/19 0800 02/21/19 0848 02/21/19 0900 02/21/19 1000  BP: (!) 168/82  (!) 152/71 (!) 123/57  Pulse: (!) 106  100 96  Resp:   19 17  Temp: 97.8 F (36.6 C)     TempSrc: Oral     SpO2: 92% (!) 87% 92% 94%  Weight:      Height:         Intake/Output Summary (Last 24 hours) at 02/21/2019 1056 Last data filed at 02/21/2019 1000 Gross per 24 hour  Intake 1800.59 ml  Output 3895 ml  Net -2094.41 ml   Last 3 Weights 02/21/2019 02/20/2019 02/19/2019  Weight (lbs) 182 lb 8.7 oz 174 lb 6.1 oz 176 lb 9.4 oz  Weight (kg) 82.8 kg 79.1 kg 80.1 kg      Telemetry    NSR- Personally Reviewed  Physical Exam   GEN: intubated; alert Neck: JVP difficult to assess Cardiac:  RRR, no systolic murmur Respiratory: mild rhonchi GI: Soft, mild tenderness to palpation; no masses MS: trace edema Neuro:  alert; intubated   Labs    Chemistry Recent Labs  Lab 02/19/19 0450 02/20/19 0309 02/21/19 0403  NA 142 144 149*  K 3.2* 4.0 4.5  CL 105 105 108  CO2 _0 GLUCOSE 143* 189* 221*  BUN 99* 95* 83*  CREATININE 1.58* 1.54* 1.37*  CALCIUM 9.1 9.2 9.8  GFRNONAA 32* 33* 38*  GFRAA 37* 39* 45*  ANIONGAP _1 Hematology Recent Labs  Lab 02/18/19 1000 02/19/19 0450 02/21/19 0403  WBC 13.3* 11.5* 12.9*  RBC 3.26* 3.49* 3.77*  HGB 8.1* 8.6* 9.5*  HCT 28.4* 30.1* 33.2*  MCV 87.1 86.2 88.1  MCH 24.8* 24.6*  25.2*  MCHC 28.5* 28.6* 28.6*  RDW 19.8* 20.1* 20.7*  PLT 383 423* 503*     BNP Recent Labs  Lab 02/20/19 0905 02/21/19 0403  BNP 1,046.8* 935.7*    Radiology    Dg Abd 1 View  Result Date: 02/19/2019 CLINICAL DATA:  Abdominal distention. EXAM: ABDOMEN - 1 VIEW COMPARISON:  Abdominal x-ray dated February 10, 2019. FINDINGS: Unchanged enteric tube in the stomach. Normal bowel gas pattern. No acute osseous abnormality. IMPRESSION: Negative. Electronically Signed   By: Titus Dubin M.D.   On: 02/19/2019 13:29   Dg Chest Port 1 View  Result Date: 02/21/2019 CLINICAL DATA:  Hypoxia EXAM: PORTABLE CHEST 1 VIEW COMPARISON:  February 19, 2019 FINDINGS: Endotracheal tube tip is 2.5 cm above the carina. Nasogastric tube tip and side port are below the diaphragm. Central catheter tip is in the right atrium  a. No pneumothorax. There is cardiomegaly with pulmonary venous hypertension. There are bilateral pleural effusions. There is interstitial and patchy alveolar opacity bilaterally, likely alveolar edema. There is overall increased opacity in the left upper lobe compared to 2 days prior. Opacities elsewhere are essentially stable. There is aortic atherosclerosis. No adenopathy. No bone lesions. IMPRESSION: Tube and catheter positions as described without pneumothorax. Pulmonary vascular congestion with pleural effusions. There is interstitial and alveolar edema felt to represent congestive heart failure. Increased opacity in the left upper lobe may represent an increase in edema or also could represent a focus of pneumonia. A degree of superimposed pneumonia cannot be excluded at several sites. Aortic Atherosclerosis (ICD10-I70.0). Electronically Signed   By: Lowella Grip III M.D.   On: 02/21/2019 07:03    Cardiac Studies   Echocardiogram March 2020-  1. The left ventricle has moderately reduced systolic function, with an ejection fraction of 35-40%. The cavity size was normal. Left ventricular diastolic Doppler parameters are consistent with pseudonormalization. Elevated left ventricular end-diastolic pressure The E/e' is 48. Regional wall motion abnormalities (see coded diagram below).  2. The right ventricle has mildly reduced systolic function. The cavity was normal. There is no increase in right ventricular wall thickness.  3. No hemodynamically significant valvular heart disease.  4. Normal biatrial chamber size.  5. IVC not well visualized.  Cardiac catheterization March 2020-  2nd Mrg-3 lesion is 80% stenosed.  Acute Mrg lesion is 90% stenosed.    Less than 25% left main  Proximal diffuse 30 to 40% narrowing within the previously placed LAD stent.  The entire apical LAD is diffusely diseased with up to 85% stenosis.  The first diagonal contains 85% ostial narrowing.  Circumflex  contains diffuse proximal to mid eccentric 50% stenosis the dominant second obtuse marginal contains mid vessel 50% stenosis in the mid to distal portion of this obtuse marginal there is a patent stent with 20% narrowing, and just distal to the stent there is an 80% thrombotic stenosis.  The RCA is dominant containing 50% mid vessel stenosis, 70% distal stenosis, 70% ostial PDA stenosis, and total occlusion of the mid vessel that fills by collaterals from the left ventricular apex.  Left ventriculography is not performed.  Today's echo was reviewed and there is anteroapical severe hypokinesis and estimated ejection fraction of 40%.  LVEDP by Cath Lab hemodynamics is 40 mmHg.  RECOMMENDATIONS:   Acute on chronic combined systolic and diastolic heart failure with extremely elevated LVEDP greater than or equal to 40 mmHg related to global ischemia in the setting of norepinephrine therapy.  Recommend clopidogrel therapy if possible  given stents in multiple territories and evidence of active thrombus beyond the stent in the obtuse marginal.  Current anatomy does not suggest that a role for coronary intervention.  35 cc of contrast used.  Given diabetes/shock state/intrinsic kidney disease, a bump in creatinine is still highly likely.  Kidney function needs to be monitored.  Patient Profile     73 y.o. female visiting from [place] admitted March 10 with fever, hypotension and found to have group A strep bacteremia requiring intubation.  Also ruled in for non-ST elevation myocardial infarction presently being treated medically. Had cath and medical therapy recommended. Blood cultures March 10 showed group A strep.  Respiratory virus panel showed rhinovirus.  Influenza A and B negative.  Coronavirus negative.  Note LV dysfunction possibly secondary to stress cardiomyopathy.  Assessment & Plan    1 paroxysmal atrial fibrillation-Pt remains in sinus. Continue amiodarone (change to 200 mg daily in  one week) and low dose coreg. As outlined previously my hope is that her atrial fibrillation is related to acute illness and long-term amiodarone will not be required.  Continue subcutaneous heparin.    2 NSTEMI/coronary artery disease-plan is for medical therapy.  Continue aspirin and Plavix.  Add statin when extubated.  3 cardiomyopathy-question ischemic versus stress-induced cardiomyopathy.  Continue hydralazine/nitrates (not a candidate for ARB or Entresto given severity of renal insufficiency).  Continue low-dose carvedilol.  Will not advance in the setting of acute CHF.    4 acute combined systolic/diastolic congestive heart failure-I/O - 270. Coox 57.7. CVP 22.  Continue milrinone. Increase lasix to 40 mg TID; follow renal function.   5 pneumonia-antibiotics per CCM.  6 ventilator dependent respiratory failure-vent wean per critical care medicine.  7 acute on chronc stage 3 kidney disease-Renal function slightly improved; follow.  For questions or updates, please contact [facility] Please consult [email or web address] for contact info under        Signed, Kirk Ruths, MD  02/21/2019, 10:56 AM   Addendum-I received a message today that patient's family wanted to discuss patient's heart condition.  I contacted the patient's son-in-law and reviewed her course from a cardiac standpoint.  All questions were answered.  We discussed that she has moderate LV dysfunction and is receiving medications (presently on milrinone, BiDil and low-dose Coreg).  She is also being diuresed for pulmonary edema.  Critical care is attempting vent wean. Kirk Ruths, MD

## 2019-02-21 NOTE — Progress Notes (Signed)
OT Cancellation Note  Patient Details Name: Gabrielle Wright MRN: [id number] DOB: 10-01-1946   Cancelled Treatment:    Reason Eval/Treat Not Completed: Other (comment)(Family deciding on one way extubation vs trach) Therapy will await decision, and medical stability.  Gabrielle Wright 02/21/2019, 10:49 AM  Sherryl Manges OTR/L Acute Rehabilitation Services Pager: [phone] Office: [phone]

## 2019-02-21 NOTE — Progress Notes (Signed)
PT Cancellation Note  Patient Details Name: Gabrielle Wright MRN: [id number] DOB: [birth year]   Cancelled Treatment:      Reason Eval/Treat Not Completed: Other (comment)(Family deciding on one way extubation vs trach) Therapy will await decision, and medical stability.  Etta Grandchild, PT, DPT Acute Rehabilitation Services Pager: [phone] Office: [phone]     Etta Grandchild 02/21/2019, 11:10 AM

## 2019-02-21 NOTE — Progress Notes (Signed)
NAME:  Gabrielle Wright, MRN:  [id number], DOB:  10/17/1946, LOS: 17 ADMISSION DATE:  02/03/2019, CONSULTATION DATE: 02/04/2019 REFERRING MD: Emergency department physician cHIEF COMPLAINT: Fever respiratory failure  Brief History   73 year old elderly woman visiting from [place], admitted 3/10 with fevers, hypotension, found to have group A strep bacteremia, required mechanical ventilation Course complicated by non-STEMI and new LV dysfunction ? Ischemic vs Takatsubo's Failed extubation 3/18 due to acute pulmonary edema  Past Medical History  Hypertension Coronary artery disease Suspected diabetes review of her arterial consult receiving correct.  Significant Hospital IsEvents   02/04/2019 transfer from [facility] to [facility] required intubation and pressors. 3/16 off Levophed 3/18 extubated briefly but reintubated due to pulmonary edema 3/19 started milrinone 3/20 Episode of respiratory distress,  Precedex changed to propofol Atrial fibrillation RVR >>amiodarone bolus >> sinus rhythm 3/21 rising creat , AF-RVR >> amio bolus 3/24:rising creatinine,     Consults:  02/04/2019 ID   Procedures:  02/04/2019 intubation>> 3/18, 3/18 >> 02/04/2019 CVL>>  Significant Diagnostic Tests:  Echo 3/15 decreased EF 35 to 40%, LVEDP 27, no significant MR Cardiac cath >> LVEDP 40, RCA totally occluded, patent OM1 stent but distally occluded, apical LAD diffusely diseased, no intervention Chest x-ray 3/22  bilateral lower lobe airspace disease versus layering effusions CXR 3/23> pulmonary edema and bilateral pleural effusions. Personally reviewed   Micro Data:  02/04/2019 blood cultures x2>> Gr A strep 02/04/2019 sputum culture>> neg 02/04/2019 urine culture>>neg 02/04/2019 respiratory virus panel>> rhinovirus 02/04/2019 flu a and B>> neg coronavirus testing 02/04/2019>>neg resp 3/21 >> Klebsiella oxytoca, co-ag negative staph  Antimicrobials:  02/04/2019 vancomycin>>3/ 10  02/04/2019 Zosyn>> 3/10 Pen G 3/10 >> 3/22 (plan) ceftx 3/20 >>3/24 Cipro 3/24>>   Interim history/subjective:  No events overnight, no new complaints  Objective   Blood pressure (!) 152/71, pulse 100, temperature 97.8 F (36.6 C), temperature source Oral, resp. rate 19, height 5\' 6"  (1.676 m), weight 82.8 kg, SpO2 92 %. CVP:  [12 mmHg-18 mmHg] 18 mmHg  Vent Mode: PRVC FiO2 (%):  [40 %] 40 % Set Rate:  [18 bmp] 18 bmp Vt Set:  [450 mL] 450 mL PEEP:  [5 cmH20] 5 cmH20 Pressure Support:  [10 cmH20] 10 cmH20 Plateau Pressure:  [19 cmH20-30 cmH20] 30 cmH20   Intake/Output Summary (Last 24 hours) at 02/21/2019 4680 Last data filed at 02/21/2019 0900 Gross per 24 hour  Intake 1840.64 ml  Output 3795 ml  Net -1954.36 ml   Filed Weights   02/19/19 0500 02/20/19 0334 02/21/19 0433  Weight: 80.1 kg 79.1 kg 82.8 kg   Examination: General: Ill appearing, NAD Neuro: Awake and interactive, moving all ext to command HEENT: [place]/AT, PERRL, EOM-I and MMM Pulm: Coarse BS diffusely Cardiovascualr: RRR, Nl S1/S2 and -M/R/G Abdomen: Soft, NT, ND and +BS Extremities: 1+ lower extremity edema Skin: No rash  I reviewed CXR myself, ETT is in a good position  Resolved Hospital Problem list   Hyperkalemia   Assessment & Plan:   Septic shock/group A strep bacteremia - Viral testing  negative for COVID, positive for rhino  -Off pressors, changed from penicillin to ceftriaxone, plan was to stop 3/21 but leukocytosis concerning, continued until 3/23.  -Improved leukocytosis  P Cipro as below for pulmonary process  Acute respiratory failure requiring mechanical ventilation -Pulmonary edema continues to be significant limiting factor -pleural effusions PNA Sputum Culture 3/21>> + for Klebsiella Oxytoca and rare staph   Lugdenensis P PS trials but no extubation prior  to discussion with the family regarding plan of care, options are trach or one way extubation CXR in AM Continue  ciprofloxacin, covering Klebsiella, staph lugdunensis  Acute Kidney Injury -3/24 Cr 2.08  -etiology likely multifactorial-- Sepsis, ARB, diuresis Hyperphosphatemia Hypermagnesemia Hypokalemia in setting of diuresis  P Continue current dose of lasix Free water Follow BMP, urine output, CVP Replace electrolytes as indicated  Ischemic Cardiomyopathy/ LVEDP  - with no targets for revascularization on cath C0-ox 60% on 3/24 P Dr. Ludwig Clarks input.  Repeat echocardiogram planned for 3/26 EF 35-40% Continue aspirin, Plavix Continue Bidil Patient is hypertensive, will d/c milrinone  Atrial Fibrillation with RVR Short run 3/24>> Resolved with suction P Remains on amiodarone, carvedilol Continue telemetry   Diabetes type 2/uncontrolled hyperglycemia -Was on high dose of 70/30 at home P Resistant sliding scale insulin Levemir 60 twice daily with tube feed coverage  Abdominal distention/ Ileus -improved P Minimize narcotics Bowel regimen as ordered  Acute encephalopathy-intermittent severe agitation, worsened by language barrier P -RASS goal 0 to -1 Careful with sedating medications Fentanyl, propofol, Versed if needed  Acute Anemia -anemia of critical illness -no evidence of blood loss - s/p 1 PRBC 3/21, 1 PRBC 3/22 P Following CBC Goal Hgb > 8  LLQ abdominal pain P KUB reassuring, follow  GI ppx: Protonix VAP: Continue Pain/Sedation: propofol, fentanyl versed DVT ppx: Heparin SQ  Diet: Enteral Nutrition Code Status: full Family Communication: Called daughter to address code status and trach vs one way extubation, straight to voice mail, message left  The patient is critically ill with multiple organ systems failure and requires high complexity decision making for assessment and support, frequent evaluation and titration of therapies, application of advanced monitoring technologies and extensive interpretation of multiple databases.   Critical Care Time  devoted to patient care services described in this note is  36  Minutes. This time reflects time of care of this signee Dr Koren Bound. This critical care time does not reflect procedure time, or teaching time or supervisory time of PA/NP/Med student/Med Resident etc but could involve care discussion time.  Alyson Reedy, M.D. [facility] Pulmonary/Critical Care Medicine. Pager: [phone]. After hours pager: [phone].

## 2019-02-21 NOTE — Progress Notes (Signed)
Spoke with patient's daughter and son-in-law regarding goals of care. Informed them that the patient's condition is critical, and she has been unable to successfully wean from ventilator.  Family stated that they understand the patient is ill, but that they are concerned the "problem isn't being addressed." RN reviewed treatment plan. Discussed trach vs one-way extubation. Family leaning towards trach at this time; stated they will call later this afternoon or tomorrow with decision.

## 2019-02-22 ENCOUNTER — Inpatient Hospital Stay (HOSPITAL_COMMUNITY): Payer: Medicaid Other

## 2019-02-22 DIAGNOSIS — R140 Abdominal distension (gaseous): Secondary | ICD-10-CM

## 2019-02-22 DIAGNOSIS — I429 Cardiomyopathy, unspecified: Secondary | ICD-10-CM

## 2019-02-22 DIAGNOSIS — I25118 Atherosclerotic heart disease of native coronary artery with other forms of angina pectoris: Secondary | ICD-10-CM

## 2019-02-22 DIAGNOSIS — I5041 Acute combined systolic (congestive) and diastolic (congestive) heart failure: Secondary | ICD-10-CM

## 2019-02-22 DIAGNOSIS — N183 Chronic kidney disease, stage 3 (moderate): Secondary | ICD-10-CM

## 2019-02-22 LAB — GLUCOSE, CAPILLARY
GLUCOSE-CAPILLARY: 127 mg/dL — AB (ref 70–99)
GLUCOSE-CAPILLARY: 166 mg/dL — AB (ref 70–99)
Glucose-Capillary: 141 mg/dL — ABNORMAL HIGH (ref 70–99)
Glucose-Capillary: 161 mg/dL — ABNORMAL HIGH (ref 70–99)
Glucose-Capillary: 166 mg/dL — ABNORMAL HIGH (ref 70–99)
Glucose-Capillary: 183 mg/dL — ABNORMAL HIGH (ref 70–99)
Glucose-Capillary: 218 mg/dL — ABNORMAL HIGH (ref 70–99)
Glucose-Capillary: 247 mg/dL — ABNORMAL HIGH (ref 70–99)

## 2019-02-22 LAB — POCT I-STAT 7, (LYTES, BLD GAS, ICA,H+H)
Acid-Base Excess: 11 mmol/L — ABNORMAL HIGH (ref 0.0–2.0)
Acid-Base Excess: 11 mmol/L — ABNORMAL HIGH (ref 0.0–2.0)
BICARBONATE: 36 mmol/L — AB (ref 20.0–28.0)
Bicarbonate: 36.7 mmol/L — ABNORMAL HIGH (ref 20.0–28.0)
Calcium, Ion: 1.28 mmol/L (ref 1.15–1.40)
Calcium, Ion: 1.28 mmol/L (ref 1.15–1.40)
HCT: 29 % — ABNORMAL LOW (ref 36.0–46.0)
HCT: 31 % — ABNORMAL LOW (ref 36.0–46.0)
HEMOGLOBIN: 10.5 g/dL — AB (ref 12.0–15.0)
Hemoglobin: 9.9 g/dL — ABNORMAL LOW (ref 12.0–15.0)
O2 Saturation: 98 %
O2 Saturation: 94 %
PCO2 ART: 51.6 mmHg — AB (ref 32.0–48.0)
Patient temperature: 98.2
Potassium: 3.8 mmol/L (ref 3.5–5.1)
Potassium: 3.9 mmol/L (ref 3.5–5.1)
Sodium: 148 mmol/L — ABNORMAL HIGH (ref 135–145)
Sodium: 147 mmol/L — ABNORMAL HIGH (ref 135–145)
TCO2: 37 mmol/L — AB (ref 22–32)
TCO2: 38 mmol/L — ABNORMAL HIGH (ref 22–32)
pCO2 arterial: 46 mmHg (ref 32.0–48.0)
pH, Arterial: 7.501 — ABNORMAL HIGH (ref 7.350–7.450)
pH, Arterial: 7.46 — ABNORMAL HIGH (ref 7.350–7.450)
pO2, Arterial: 100 mmHg (ref 83.0–108.0)
pO2, Arterial: 68 mmHg — ABNORMAL LOW (ref 83.0–108.0)

## 2019-02-22 LAB — COOXEMETRY PANEL
Carboxyhemoglobin: 1.6 % — ABNORMAL HIGH (ref 0.5–1.5)
Methemoglobin: 1.8 % — ABNORMAL HIGH (ref 0.0–1.5)
O2 Saturation: 67.1 %
Total hemoglobin: 10.1 g/dL — ABNORMAL LOW (ref 12.0–16.0)

## 2019-02-22 LAB — BASIC METABOLIC PANEL
Anion gap: 10 (ref 5–15)
BUN: 79 mg/dL — ABNORMAL HIGH (ref 8–23)
CO2: 33 mmol/L — ABNORMAL HIGH (ref 22–32)
Calcium: 9.3 mg/dL (ref 8.9–10.3)
Chloride: 104 mmol/L (ref 98–111)
Creatinine, Ser: 1.41 mg/dL — ABNORMAL HIGH (ref 0.44–1.00)
GFR calc Af Amer: 43 mL/min — ABNORMAL LOW (ref >60)
GFR calc non Af Amer: 37 mL/min — ABNORMAL LOW (ref >60)
Glucose, Bld: 184 mg/dL — ABNORMAL HIGH (ref 70–99)
POTASSIUM: 3.7 mmol/L (ref 3.5–5.1)
Sodium: 147 mmol/L — ABNORMAL HIGH (ref 135–145)

## 2019-02-22 LAB — CBC
HCT: 33 % — ABNORMAL LOW (ref 36.0–46.0)
Hemoglobin: 9.1 g/dL — ABNORMAL LOW (ref 12.0–15.0)
MCH: 24.1 pg — AB (ref 26.0–34.0)
MCHC: 27.6 g/dL — ABNORMAL LOW (ref 30.0–36.0)
MCV: 87.3 fL (ref 80.0–100.0)
Platelets: 460 10*3/uL — ABNORMAL HIGH (ref 150–400)
RBC: 3.78 MIL/uL — AB (ref 3.87–5.11)
RDW: 20.5 % — ABNORMAL HIGH (ref 11.5–15.5)
WBC: 10.6 10*3/uL — ABNORMAL HIGH (ref 4.0–10.5)
nRBC: 0.2 % (ref 0.0–0.2)

## 2019-02-22 LAB — PHOSPHORUS: Phosphorus: 4.8 mg/dL — ABNORMAL HIGH (ref 2.5–4.6)

## 2019-02-22 LAB — MAGNESIUM: Magnesium: 2.4 mg/dL (ref 1.7–2.4)

## 2019-02-22 NOTE — Progress Notes (Signed)
RT obtained ABG on pt with the following results. RT will continue to monitor.   Results for Gabrielle Wright, Gabrielle Wright (MRN [id number]) as of 02/22/2019 07:36  Ref. Range 02/22/2019 05:56  Sample type Unknown ARTERIAL  pH, Arterial Latest Ref Range: 7.350 - 7.450  7.501 (H)  pCO2 arterial Latest Ref Range: 32.0 - 48.0 mmHg 46.0  pO2, Arterial Latest Ref Range: 83.0 - 108.0 mmHg 100.0  TCO2 Latest Ref Range: 22 - 32 mmol/L 37 (H)  Acid-Base Excess Latest Ref Range: 0.0 - 2.0 mmol/L 11.0 (H)  Bicarbonate Latest Ref Range: 20.0 - 28.0 mmol/L 36.0 (H)  O2 Saturation Latest Units: % 98.0  Patient temperature Unknown 98.2 F  Collection site Unknown RADIAL, ALLEN'S TEST ACCEPTABLE

## 2019-02-22 NOTE — Progress Notes (Signed)
NAME:  Gabrielle Wright, MRN:  [id number], DOB:  July 28, 1946, LOS: 18 ADMISSION DATE:  02/03/2019, CONSULTATION DATE: 02/04/2019 REFERRING MD: Emergency department physician cHIEF COMPLAINT: Fever respiratory failure  Brief History   73 year old elderly woman visiting from [place], admitted 3/10 with fevers, hypotension, found to have group A strep bacteremia, required mechanical ventilation Course complicated by non-STEMI and new LV dysfunction ? Ischemic vs Takatsubo's Failed extubation 3/18 due to acute pulmonary edema  Past Medical History  Hypertension Coronary artery disease Suspected diabetes review of her arterial consult receiving correct.  Significant Hospital IsEvents   02/04/2019 transfer from [facility] to [facility] required intubation and pressors. 3/16 off Levophed 3/18 extubated briefly but reintubated due to pulmonary edema 3/19 started milrinone 3/20 Episode of respiratory distress,  Precedex changed to propofol Atrial fibrillation RVR >>amiodarone bolus >> sinus rhythm 3/21 rising creat , AF-RVR >> amio bolus 3/24:rising creatinine,     Consults:  02/04/2019 ID   Procedures:  02/04/2019 intubation>> 3/18, 3/18 >> 02/04/2019 CVL>>  Significant Diagnostic Tests:  Echo 3/15 decreased EF 35 to 40%, LVEDP 27, no significant MR Cardiac cath >> LVEDP 40, RCA totally occluded, patent OM1 stent but distally occluded, apical LAD diffusely diseased, no intervention Chest x-ray 3/22  bilateral lower lobe airspace disease versus layering effusions CXR 3/23> pulmonary edema and bilateral pleural effusions. Personally reviewed   Micro Data:  02/04/2019 blood cultures x2>> Gr A strep 02/04/2019 sputum culture>> neg 02/04/2019 urine culture>>neg 02/04/2019 respiratory virus panel>> rhinovirus 02/04/2019 flu a and B>> neg coronavirus testing 02/04/2019>>neg resp 3/21 >> Klebsiella oxytoca, co-ag negative staph  Antimicrobials:  02/04/2019 vancomycin>>3/ 10  02/04/2019 Zosyn>> 3/10 Pen G 3/10 >> 3/22 (plan) ceftx 3/20 >>3/24 Cipro 3/24>>   Interim history/subjective:   Improved mentation, followed simple commands with good eye contact.  Will try to do family visit via E Link if possible as patient does not speak english .  Failed weaning this am  Remains on fentanyl for agitation.   Objective   Blood pressure 120/62, pulse 92, temperature 98.3 F (36.8 C), temperature source Oral, resp. rate 18, height 5\' 6"  (1.676 m), weight 80.4 kg, SpO2 100 %. CVP:  [8 mmHg-12 mmHg] 8 mmHg  Vent Mode: PRVC FiO2 (%):  [40 %] 40 % Set Rate:  [18 bmp] 18 bmp Vt Set:  [450 mL] 450 mL PEEP:  [5 cmH20] 5 cmH20 Plateau Pressure:  [21 cmH20-30 cmH20] 25 cmH20   Intake/Output Summary (Last 24 hours) at 02/22/2019 8003 Last data filed at 02/22/2019 0600 Gross per 24 hour  Intake 2451.4 ml  Output 3450 ml  Net -998.6 ml   Filed Weights   02/20/19 0334 02/21/19 0433 02/22/19 0500  Weight: 79.1 kg 82.8 kg 80.4 kg   Examination: General: Ill-appearing female on vent Neuro: Awake and interactive follows simple commands moves all extremities to command  HEENT: [place]/AT, PE RRL, MMM, ETT  Pulm: Coarse breath sounds bilaterally  Cardiovascualr: RRR, no MRG  Abdomen: Soft, bowel sounds positive, obese , tube feeds Extremities : Trace edema Skin: Intact without rash  CXR 3/28 reviewed myself, R>L effusion stable, improved aeration with decreased edema bilaterally   Resolved Hospital Problem list   Hyperkalemia   Assessment & Plan:   Septic shock/group A strep bacteremia - Viral testing  negative for COVID, positive for rhino  -Off pressors, changed from penicillin to ceftriaxone, plan was to stop 3/21 but leukocytosis concerning, continued until 3/23.  -Improved leukocytosis  P Cipro as below  for pulmonary process  Acute respiratory failure requiring mechanical ventilation -Pulmonary edema continues to be significant limiting factor -pleural  effusions PNA Sputum Culture 3/21>> + for Klebsiella Oxytoca and rare staph  Lugdenensis CXR 3/28 improved aeration w/ decreased edema  P PS trials but no extubation prior to discussion with the family regarding plan of care, options are trach or one way extubation CXR in AM Continue ciprofloxacin, covering Klebsiella, staph lugdunensis  Acute Kidney Injury -3/24 Cr 2.08  -etiology likely multifactorial-- Sepsis, ARB, diuresis Hyperphosphatemia Hypermagnesemia-resolved  Hypokalemia in setting of diuresis -improved  P Continue current dose of lasix Free water Follow BMP, urine output, CVP Replace electrolytes as indicated  Ischemic Cardiomyopathy/ LVEDP  - with no targets for revascularization on cath C0-ox 60% on 3/24 Echo 3/26 EF 35-40%, LV diffuse hypokinesis  P Continue aspirin, Plavix Continue Bidil and Coreg  Cont Lasix    Atrial Fibrillation with RVR Short run 3/24>> Resolved with suction P Remains on amiodarone, carvedilol Continue telemetry   Diabetes type 2/uncontrolled hyperglycemia -Was on high dose of 70/30 at home P Resistant sliding scale insulin Levemir 60 twice daily with tube feed coverage  Abdominal distention/ Ileus -improved P Minimize narcotics Bowel regimen as ordered  Acute encephalopathy-intermittent severe agitation, worsened by language barrier  P -RASS goal 0 to -1 Careful with sedating medications Wean fent as able   Acute Anemia -anemia of critical illness -no evidence of blood loss - s/p 1 PRBC 3/21, 1 PRBC 3/22 P Following CBC Goal Hgb > 8  LLQ abdominal pain-KUB 3/25 neg  P:  Resolved.   GI ppx: Protonix VAP: Continue Pain/Sedation: fent  DVT ppx: Heparin SQ  Diet: Enteral Nutrition Code Status: full Family Communication: Spoke with Daughter and Son in Social worker , discussed status . Video visit at 10 am today , reviewed if unable to wean , may need trach vs one way extubation. Will revisit early next week.     Jovon Streetman NP-C  [facility]  [phone]  02/22/2019

## 2019-02-22 NOTE — Progress Notes (Signed)
Progress Note  Patient Name: Gabrielle Wright Date of Encounter: 02/22/2019  Primary Cardiologist: Minus Breeding, MD   Subjective   Intubated. Eyes open. Nurse present. Recent vent changes. Diuresing well.   Inpatient Medications    Scheduled Meds: . amiodarone  400 mg Per Tube BID  . aspirin  81 mg Oral Daily  . carvedilol  3.125 mg Per NG tube BID WC  . chlorhexidine gluconate (MEDLINE KIT)  15 mL Mouth Rinse BID  . Chlorhexidine Gluconate Cloth  6 each Topical Q0600  . clopidogrel  75 mg Oral Daily  . feeding supplement (PRO-STAT SUGAR FREE 64)  30 mL Per Tube BID  . feeding supplement (VITAL AF 1.2 CAL)  1,000 mL Per Tube Q24H  . free water  250 mL Per Tube Q6H  . furosemide  40 mg Intravenous BID  . heparin  5,000 Units Subcutaneous Q8H  . insulin aspart  0-24 Units Subcutaneous Q4H  . insulin aspart  7 Units Subcutaneous Q4H  . insulin detemir  65 Units Subcutaneous BID  . isosorbide-hydrALAZINE  1 tablet Oral TID  . mouth rinse  15 mL Mouth Rinse 10 times per day  . multivitamin  15 mL Per Tube Daily  . pantoprazole sodium  40 mg Per Tube QHS  . polyethylene glycol  17 g Oral Daily  . sodium chloride flush  10-40 mL Intracatheter Q12H   Continuous Infusions: . sodium chloride 10 mL/hr at 02/22/19 1100  . ciprofloxacin Stopped (02/22/19 0142)  . fentaNYL infusion INTRAVENOUS 150 mcg/hr (02/22/19 1100)  . milrinone 0.25 mcg/kg/min (02/22/19 1100)  . propofol (DIPRIVAN) infusion Stopped (02/20/19 0705)   PRN Meds: acetaminophen (TYLENOL) oral liquid 160 mg/5 mL, acetaminophen, bisacodyl, dextrose, diphenhydrAMINE, fentaNYL, fentaNYL (SUBLIMAZE) injection, fentaNYL (SUBLIMAZE) injection, hydrALAZINE, ipratropium-albuterol, midazolam, ondansetron (ZOFRAN) IV, sennosides, sodium chloride flush   Vital Signs    Vitals:   02/22/19 0907 02/22/19 1000 02/22/19 1100 02/22/19 1113  BP: (!) 129/56 (!) 135/59 (!) 121/54 (!) 121/54  Pulse: 90 85 79 81  Resp:  18 18 18    Temp:      TempSrc:      SpO2:  97% 97% 97%  Weight:      Height:        Intake/Output Summary (Last 24 hours) at 02/22/2019 1135 Last data filed at 02/22/2019 1100 Gross per 24 hour  Intake 2365.68 ml  Output 3000 ml  Net -634.32 ml   Filed Weights   02/20/19 0334 02/21/19 0433 02/22/19 0500  Weight: 79.1 kg 82.8 kg 80.4 kg    Telemetry     NSR - Personally Reviewed    Physical Exam   GEN: No acute distress. Intubated, alert.   Neck: Unable to assess JVP Cardiac: RRR, no murmurs, rubs, or gallops.  Respiratory: Mild rhonchi GI: Soft, nontender, non-distended  MS: No edema; No deformity. Neuro:  Nonfocal  Psych: Normal affect   Labs    Chemistry Recent Labs  Lab 02/20/19 0309 02/21/19 0403 02/22/19 0551 02/22/19 0556  NA 144 149* 147* 148*  K 4.0 4.5 3.7 3.8  CL 105 108 104  --   CO2 28 31 33*  --   GLUCOSE 189* 221* 184*  --   BUN 95* 83* 79*  --   CREATININE 1.54* 1.37* 1.41*  --   CALCIUM 9.2 9.8 9.3  --   GFRNONAA 33* 38* 37*  --   GFRAA 39* 45* 43*  --   ANIONGAP 11 10 10   --  Hematology Recent Labs  Lab 02/19/19 0450 02/21/19 0403 02/22/19 0551 02/22/19 0556  WBC 11.5* 12.9* 10.6*  --   RBC 3.49* 3.77* 3.78*  --   HGB 8.6* 9.5* 9.1* 9.9*  HCT 30.1* 33.2* 33.0* 29.0*  MCV 86.2 88.1 87.3  --   MCH 24.6* 25.2* 24.1*  --   MCHC 28.6* 28.6* 27.6*  --   RDW 20.1* 20.7* 20.5*  --   PLT 423* 503* 460*  --     Cardiac EnzymesNo results for input(s): TROPONINI in the last 168 hours. No results for input(s): TROPIPOC in the last 168 hours.   BNP Recent Labs  Lab 02/20/19 0905 02/21/19 0403  BNP 1,046.8* 935.7*     DDimer No results for input(s): DDIMER in the last 168 hours.   Radiology    Dg Chest Port 1 View  Result Date: 02/22/2019 CLINICAL DATA:  Initial evaluation for endotracheal tube placement. EXAM: PORTABLE CHEST 1 VIEW COMPARISON:  Prior radiograph from 02/21/2019. FINDINGS: Endotracheal tube in place with tip  positioned 4.3 cm above the carina. Enteric tube in place with tip overlying the proximal stomach, side hole in the distal esophagus. Left subclavian land approach central venous catheter in stable position with tip overlying the right atrium. Stable cardiomegaly. Mediastinal silhouette normal. Aortic atherosclerosis. Lungs are hypoinflated. Small right greater than left pleural effusions. Persistent moderate diffuse pulmonary edema, improved from previous. Associated dense bibasilar opacities favored to reflect atelectasis, although infiltrates may be present as well. No pneumothorax. Osseous structures unchanged. IMPRESSION: 1. Tip of endotracheal tube position 4.3 cm above the carina. 2. Enteric tube in place, tip in the proximal stomach, side hole in the distal esophagus. 3. Moderate diffuse pulmonary edema with small to moderate right greater than left pleural effusions, improved relative to previous. Superimposed bibasilar opacities could reflect atelectasis or infiltrates. Electronically Signed   By: Jeannine Boga M.D.   On: 02/22/2019 05:54   Dg Chest Port 1 View  Result Date: 02/21/2019 CLINICAL DATA:  Hypoxia EXAM: PORTABLE CHEST 1 VIEW COMPARISON:  February 19, 2019 FINDINGS: Endotracheal tube tip is 2.5 cm above the carina. Nasogastric tube tip and side port are below the diaphragm. Central catheter tip is in the right atrium a. No pneumothorax. There is cardiomegaly with pulmonary venous hypertension. There are bilateral pleural effusions. There is interstitial and patchy alveolar opacity bilaterally, likely alveolar edema. There is overall increased opacity in the left upper lobe compared to 2 days prior. Opacities elsewhere are essentially stable. There is aortic atherosclerosis. No adenopathy. No bone lesions. IMPRESSION: Tube and catheter positions as described without pneumothorax. Pulmonary vascular congestion with pleural effusions. There is interstitial and alveolar edema felt to  represent congestive heart failure. Increased opacity in the left upper lobe may represent an increase in edema or also could represent a focus of pneumonia. A degree of superimposed pneumonia cannot be excluded at several sites. Aortic Atherosclerosis (ICD10-I70.0). Electronically Signed   By: Lowella Grip III M.D.   On: 02/21/2019 07:03    Cardiac Studies   Echocardiogram March 2020- 1. The left ventricle has moderately reduced systolic function, with an ejection fraction of 35-40%. The cavity size was normal. Left ventricular diastolic Doppler parameters are consistent with pseudonormalization. Elevated left ventricular end-diastolic pressure The E/e' is 20. Regional wall motion abnormalities (see coded diagram below). 2. The right ventricle has mildly reduced systolic function. The cavity was normal. There is no increase in right ventricular wall thickness. 3. No hemodynamically significant valvular heart  disease. 4. Normal biatrial chamber size. 5. IVC not well visualized.  Cardiac catheterization March 2020-  2nd Mrg-3 lesion is 80% stenosed.  Acute Mrg lesion is 90% stenosed.   Less than 25% left main  Proximal diffuse 30 to 40% narrowing within the previously placed LAD stent. The entire apical LAD is diffusely diseased with up to 85% stenosis. The first diagonal contains 85% ostial narrowing.  Circumflex contains diffuse proximal to mid eccentric 50% stenosis the dominant second obtuse marginal contains mid vessel 50% stenosis in the mid to distal portion of this obtuse marginal there is a patent stent with 20% narrowing, and just distal to the stent there is an 80% thrombotic stenosis.  The RCA is dominant containing 50% mid vessel stenosis, 70% distal stenosis, 70% ostial PDA stenosis, and total occlusion of the mid vessel that fills by collaterals from the left ventricular apex.  Left ventriculography is not performed. Today's echo was reviewed and there is  anteroapical severe hypokinesis and estimated ejection fraction of 40%. LVEDP by Cath Lab hemodynamics is 40 mmHg.  RECOMMENDATIONS:   Acute on chronic combined systolic and diastolic heart failure with extremely elevated LVEDP greater than or equal to 40 mmHg related to global ischemia in the setting of norepinephrine therapy.  Recommend clopidogrel therapy if possible given stents in multiple territories and evidence of active thrombus beyond the stent in the obtuse marginal.  Current anatomy does not suggest that a role for coronary intervention.  35 cc of contrast used. Given diabetes/shock state/intrinsic kidney disease, a bump in creatinine is still highly likely. Kidney function needs to be monitored.   Patient Profile     73 y.o. female visiting from [place] admitted March 10 with fever, hypotension and found to have group A strep bacteremia requiring intubation.  Also ruled in for non-ST elevation myocardial infarction presently being treated medically. Had cath and medical therapy recommended. Blood cultures March 10 showed group A strep.  Respiratory virus panel showed rhinovirus.  Influenza A and B negative.  Coronavirus negative.  Note LV dysfunction possibly secondary to stress cardiomyopathy.  Assessment & Plan    1. Paroxysmal atrial fibrillation-Pt remains in sinus rhythm. Continue amiodarone (change to 200 mg daily in one week) and low dose coreg. As outlined previously my hope is that her atrial fibrillation is related to acute illness and long-term amiodarone will not be required.  Continue subcutaneous heparin.    2. NSTEMI/coronary artery disease-plan is for medical therapy.  Continue aspirin, carvedilol, and Plavix.  Add statin when extubated.  3. Cardiomyopathy-question ischemic versus stress-induced cardiomyopathy.  Continue hydralazine/nitrates (not a candidate for ARB or Entresto given severity of renal insufficiency).  Continue low-dose carvedilol.  Will  not advance in the setting of acute CHF.    4. Acute combined systolic/diastolic congestive heart failure: -817 cc in past 24 hrs, over 350 cc to be emptied.. Coox 67.1. Continue milrinone. On IV Lasix 40 mg bid; follow renal function. Creatinine 1.41 today.  5. Pneumonia-antibiotics per CCM.  6. Ventilator dependent respiratory failure-vent wean per critical care medicine.  7. Acute on chronc stage 3 kidney disease-Renal function stable, Scr 1.41 today.     For questions or updates, please contact [place] Please consult [email or web address] for contact info under Cardiology/STEMI.      Signed, Kate Sable, MD  02/22/2019, 11:35 AM

## 2019-02-23 ENCOUNTER — Inpatient Hospital Stay (HOSPITAL_COMMUNITY): Payer: Medicaid Other

## 2019-02-23 LAB — BASIC METABOLIC PANEL
Anion gap: 10 (ref 5–15)
BUN: 75 mg/dL — ABNORMAL HIGH (ref 8–23)
CO2: 35 mmol/L — ABNORMAL HIGH (ref 22–32)
Calcium: 9.5 mg/dL (ref 8.9–10.3)
Chloride: 103 mmol/L (ref 98–111)
Creatinine, Ser: 1.46 mg/dL — ABNORMAL HIGH (ref 0.44–1.00)
GFR calc Af Amer: 41 mL/min — ABNORMAL LOW (ref >60)
GFR calc non Af Amer: 36 mL/min — ABNORMAL LOW (ref >60)
Glucose, Bld: 144 mg/dL — ABNORMAL HIGH (ref 70–99)
POTASSIUM: 3.7 mmol/L (ref 3.5–5.1)
Sodium: 148 mmol/L — ABNORMAL HIGH (ref 135–145)

## 2019-02-23 LAB — GLUCOSE, CAPILLARY
GLUCOSE-CAPILLARY: 132 mg/dL — AB (ref 70–99)
Glucose-Capillary: 120 mg/dL — ABNORMAL HIGH (ref 70–99)
Glucose-Capillary: 136 mg/dL — ABNORMAL HIGH (ref 70–99)
Glucose-Capillary: 163 mg/dL — ABNORMAL HIGH (ref 70–99)
Glucose-Capillary: 118 mg/dL — ABNORMAL HIGH (ref 70–99)

## 2019-02-23 LAB — CBC
HCT: 34.7 % — ABNORMAL LOW (ref 36.0–46.0)
Hemoglobin: 9.6 g/dL — ABNORMAL LOW (ref 12.0–15.0)
MCH: 24.5 pg — ABNORMAL LOW (ref 26.0–34.0)
MCHC: 27.7 g/dL — ABNORMAL LOW (ref 30.0–36.0)
MCV: 88.5 fL (ref 80.0–100.0)
Platelets: 477 10*3/uL — ABNORMAL HIGH (ref 150–400)
RBC: 3.92 MIL/uL (ref 3.87–5.11)
RDW: 20.2 % — ABNORMAL HIGH (ref 11.5–15.5)
WBC: 13.1 10*3/uL — ABNORMAL HIGH (ref 4.0–10.5)
nRBC: 0 % (ref 0.0–0.2)

## 2019-02-23 LAB — COOXEMETRY PANEL
Carboxyhemoglobin: 1.6 % — ABNORMAL HIGH (ref 0.5–1.5)
Carboxyhemoglobin: 1.6 % — ABNORMAL HIGH (ref 0.5–1.5)
Methemoglobin: 1.9 % — ABNORMAL HIGH (ref 0.0–1.5)
Methemoglobin: 1.7 % — ABNORMAL HIGH (ref 0.0–1.5)
O2 Saturation: 60.8 %
O2 Saturation: 57.5 %
Total hemoglobin: 10.9 g/dL — ABNORMAL LOW (ref 12.0–16.0)
Total hemoglobin: 12.1 g/dL (ref 12.0–16.0)

## 2019-02-23 LAB — TRIGLYCERIDES: Triglycerides: 96 mg/dL (ref <150)

## 2019-02-23 NOTE — Progress Notes (Signed)
NAME:  Gabrielle Wright, MRN:  [id number], DOB:  [birth year], LOS: 19 ADMISSION DATE:  02/03/2019, CONSULTATION DATE: 02/04/2019 REFERRING MD: Emergency department physician cHIEF COMPLAINT: Fever respiratory failure  Brief History   73 year old elderly woman visiting from [place], admitted 3/10 with fevers, hypotension, found to have group A strep bacteremia, required mechanical ventilation Course complicated by non-STEMI and new LV dysfunction ? Ischemic vs Takatsubo's Failed extubation 3/18 due to acute pulmonary edema  Past Medical History  Hypertension Coronary artery disease Suspected diabetes review of her arterial consult receiving correct.  Significant Hospital IsEvents   02/04/2019 transfer from [facility] to [facility] required intubation and pressors. 3/16 off Levophed 3/18 extubated briefly but reintubated due to pulmonary edema 3/19 started milrinone 3/20 Episode of respiratory distress,  Precedex changed to propofol Atrial fibrillation RVR >>amiodarone bolus >> sinus rhythm 3/21 rising creat , AF-RVR >> amio bolus 3/24:rising creatinine,     Consults:  02/04/2019 ID   Procedures:  02/04/2019 intubation>> 3/18, 3/18 >> 02/04/2019 CVL>>  Significant Diagnostic Tests:  Echo 3/15 decreased EF 35 to 40%, LVEDP 27, no significant MR Cardiac cath >> LVEDP 40, RCA totally occluded, patent OM1 stent but distally occluded, apical LAD diffusely diseased, no intervention Chest x-ray 3/22  bilateral lower lobe airspace disease versus layering effusions CXR 3/23> pulmonary edema and bilateral pleural effusions. Personally reviewed   Micro Data:  02/04/2019 blood cultures x2>> Gr A strep 02/04/2019 sputum culture>> neg 02/04/2019 urine culture>>neg 02/04/2019 respiratory virus panel>> rhinovirus 02/04/2019 flu a and B>> neg coronavirus testing 02/04/2019>>neg resp 3/21 >> Klebsiella oxytoca, co-ag negative staph  Antimicrobials:  02/04/2019 vancomycin>>3/ 10  02/04/2019 Zosyn>> 3/10 Pen G 3/10 >> 3/22 (plan) ceftx 3/20 >>3/24 Cipro 3/24>>   Interim history/subjective:  Weaned x 1 hr this am, sedation weaned to less than half from yesterday   Family video conference in room , seems to help a lot .   Objective   Blood pressure (!) 139/59, pulse 83, temperature 98 F (36.7 C), temperature source Oral, resp. rate 12, height 5\' 6"  (1.676 m), weight 80.4 kg, SpO2 99 %. CVP:  [7 mmHg-10 mmHg] 7 mmHg  Vent Mode: PRVC FiO2 (%):  [40 %] 40 % Set Rate:  [12 bmp] 12 bmp Vt Set:  [450 mL] 450 mL PEEP:  [5 cmH20] 5 cmH20 Pressure Support:  [16 cmH20] 16 cmH20 Plateau Pressure:  [14 cmH20-22 cmH20] 14 cmH20   Intake/Output Summary (Last 24 hours) at 02/23/2019 1245 Last data filed at 02/23/2019 1000 Gross per 24 hour  Intake 2177.27 ml  Output 2185 ml  Net -7.73 ml   Filed Weights   02/21/19 0433 02/22/19 0500 02/23/19 0240  Weight: 82.8 kg 80.4 kg 80.4 kg   Examination: General: Ill-appearing female on vent  Neuro: Awake and interactive follow simple commands   HEENT: [place]/AT, MMM, ETT   Pulm: Diminished breath sounds in the bases on vent   Cardiovascualr: RRR, no MRG   Abdomen: Soft, bowel sounds positive, obese, tube feeds  Extremities : Trace edema  Skin: Intact without rash   CXR 3/29 reviewed myself, no significant change R>L effusion stable, with bilateral interstitial markings   Resolved Hospital Problem list   Hyperkalemia   Assessment & Plan:   Septic shock/group A strep bacteremia - Viral testing - negative for COVID, positive for rhino  -Off pressors, changed from penicillin to ceftriaxone, plan was to stop 3/21 but leukocytosis concerning, continued until 3/23.  -Improved leukocytosis  Cipro as below  for pulmonary process  Acute respiratory failure requiring mechanical ventilation -Pulmonary edema continues to be significant limiting factor -pleural effusions PNA Sputum Culture 3/21>> + for Klebsiella Oxytoca and rare  staph  Lugdenensis CXR 3/28 improved aeration w/ decreased edema  P PS trials but no extubation prior to discussion with the family regarding plan of care, options are trach or one way extubation Continue ciprofloxacin, covering Klebsiella, staph lugdunensis  Acute Kidney Injury-improving  -3/24 Cr 2.08 > 1.4  -etiology likely multifactorial-- Sepsis, ARB, diuresis Hyperphosphatemia Hypermagnesemia-resolved  Hypokalemia in setting of diuresis  -improved  P Continue current dose of lasix Free water Follow BMP, urine output, CVP Replace electrolytes as indicated  Ischemic Cardiomyopathy/ LVEDP  - with no targets for revascularization on cath C0-ox 60% on 3/24 Echo 3/26 EF 35-40%, LV diffuse hypokinesis  P Cards following  Continue aspirin, Plavix Continue Bidil and Coreg  Cont Lasix    Atrial Fibrillation with RVR Short run 3/24>> Resolved with suction P Remains on amiodarone, carvedilol Continue telemetry   Diabetes type 2/uncontrolled hyperglycemia -Was on high dose of 70/30 at home P Resistant sliding scale insulin Levemir 60 twice daily with tube feed coverage  Abdominal distention/ Ileus -improved P Minimize narcotics Bowel regimen as ordered  Acute encephalopathy-intermittent severe agitation, worsened by language barrier  P -RASS goal 0 to -1 Careful with sedating medications Wean fent as able  Family video conference in room seems to help   Acute Anemia -anemia of critical illness -no evidence of blood loss - s/p 1 PRBC 3/21, 1 PRBC 3/22 P Following CBC Goal Hgb > 8  LLQ abdominal pain-KUB 3/25 neg  P:  Resolved.   GI ppx: Protonix VAP: Continue Pain/Sedation: fent  DVT ppx: Heparin SQ  Diet: Enteral Nutrition Code Status: full Family Communication: 3/28 Spoke with Daughter and Son in Social worker , discussed status . Video visit at 10 am today , reviewed if unable to wean , may need trach vs one way extubation. Will revisit early next week.      Genessa Beman NP-C  [facility]  [phone]  02/23/2019

## 2019-02-23 NOTE — Progress Notes (Signed)
Progress Note  Patient Name: Gabrielle Wright Date of Encounter: 02/23/2019  Primary Cardiologist: Minus Breeding, MD   Subjective   Remains intubated. Opens eyes spontaneously. Nurse present. CVP 7. Weaned off vent attempted for about an hour earlier today.  Inpatient Medications    Scheduled Meds:  amiodarone  400 mg Per Tube BID   aspirin  81 mg Oral Daily   carvedilol  3.125 mg Per NG tube BID WC   chlorhexidine gluconate (MEDLINE KIT)  15 mL Mouth Rinse BID   Chlorhexidine Gluconate Cloth  6 each Topical Q0600   clopidogrel  75 mg Oral Daily   feeding supplement (PRO-STAT SUGAR FREE 64)  30 mL Per Tube BID   feeding supplement (VITAL AF 1.2 CAL)  1,000 mL Per Tube Q24H   free water  250 mL Per Tube Q6H   furosemide  40 mg Intravenous BID   heparin  5,000 Units Subcutaneous Q8H   insulin aspart  0-24 Units Subcutaneous Q4H   insulin aspart  7 Units Subcutaneous Q4H   insulin detemir  65 Units Subcutaneous BID   isosorbide-hydrALAZINE  1 tablet Oral TID   mouth rinse  15 mL Mouth Rinse 10 times per day   multivitamin  15 mL Per Tube Daily   pantoprazole sodium  40 mg Per Tube QHS   polyethylene glycol  17 g Oral Daily   sodium chloride flush  10-40 mL Intracatheter Q12H   Continuous Infusions:  sodium chloride 10 mL/hr at 02/23/19 0700   ciprofloxacin Stopped (02/23/19 0120)   fentaNYL infusion INTRAVENOUS 75 mcg/hr (02/23/19 0900)   milrinone 0.25 mcg/kg/min (02/23/19 0700)   propofol (DIPRIVAN) infusion Stopped (02/20/19 0705)   PRN Meds: acetaminophen (TYLENOL) oral liquid 160 mg/5 mL, acetaminophen, bisacodyl, dextrose, diphenhydrAMINE, fentaNYL, fentaNYL (SUBLIMAZE) injection, fentaNYL (SUBLIMAZE) injection, hydrALAZINE, ipratropium-albuterol, midazolam, ondansetron (ZOFRAN) IV, sennosides, sodium chloride flush   Vital Signs    Vitals:   02/23/19 0500 02/23/19 0600 02/23/19 0700 02/23/19 0837  BP: (!) 103/51 (!) 107/53 (!) 112/54  129/60  Pulse: 72 71 74 86  Resp: _0 Temp:      TempSrc:      SpO2: 95% 95% 95% 99%  Weight:      Height:        Intake/Output Summary (Last 24 hours) at 02/23/2019 0944 Last data filed at 02/23/2019 0700 Gross per 24 hour  Intake 2116.33 ml  Output 2135 ml  Net -18.67 ml   Filed Weights   02/21/19 0433 02/22/19 0500 02/23/19 0240  Weight: 82.8 kg 80.4 kg 80.4 kg    Telemetry    NSR - Personally Reviewed   Physical Exam   GEN: No acute distress. Intubated, alert. Neck: Unable to assess JVP Cardiac: RRR, no murmurs, rubs, or gallops.  Respiratory: Clear to auscultation bilaterally. GI: Soft, nontender, non-distended  MS: No edema; No deformity. Neuro:  Nonfocal  Psych: Normal affect   Labs    Chemistry Recent Labs  Lab 02/21/19 0403 02/22/19 0551 02/22/19 0556 02/22/19 1440 02/23/19 0355  NA 149* 147* 148* 147* 148*  K 4.5 3.7 3.8 3.9 3.7  CL 108 104  --   --  103  CO2 31 33*  --   --  35*  GLUCOSE 221* 184*  --   --  144*  BUN 83* 79*  --   --  75*  CREATININE 1.37* 1.41*  --   --  1.46*  CALCIUM 9.8 9.3  --   --  9.5  GFRNONAA 38* 37*  --   --  36*  GFRAA 45* 43*  --   --  41*  ANIONGAP 10 10  --   --  10     Hematology Recent Labs  Lab 02/21/19 0403 02/22/19 0551 02/22/19 0556 02/22/19 1440 02/23/19 0355  WBC 12.9* 10.6*  --   --  13.1*  RBC 3.77* 3.78*  --   --  3.92  HGB 9.5* 9.1* 9.9* 10.5* 9.6*  HCT 33.2* 33.0* 29.0* 31.0* 34.7*  MCV 88.1 87.3  --   --  88.5  MCH 25.2* 24.1*  --   --  24.5*  MCHC 28.6* 27.6*  --   --  27.7*  RDW 20.7* 20.5*  --   --  20.2*  PLT 503* 460*  --   --  477*    Cardiac EnzymesNo results for input(s): TROPONINI in the last 168 hours. No results for input(s): TROPIPOC in the last 168 hours.   BNP Recent Labs  Lab 02/20/19 0905 02/21/19 0403  BNP 1,046.8* 935.7*     DDimer No results for input(s): DDIMER in the last 168 hours.   Radiology    Dg Abd 1 View  Result Date:  02/22/2019 CLINICAL DATA:  Nasogastric tube placement. EXAM: ABDOMEN - 1 VIEW COMPARISON:  Radiograph February 19, 2019. FINDINGS: The bowel gas pattern is normal. Distal tip of nasogastric tube is seen in proximal stomach. No radio-opaque calculi or other significant radiographic abnormality are seen. IMPRESSION: Distal tip of nasogastric tube seen in proximal stomach. No evidence of bowel obstruction or ileus. Electronically Signed   By: Marijo Conception, M.D.   On: 02/22/2019 15:15   Dg Chest Port 1 View  Result Date: 02/23/2019 CLINICAL DATA:  Respiratory failure. EXAM: PORTABLE CHEST 1 VIEW COMPARISON:  Radiograph February 22, 2019. FINDINGS: Stable cardiomegaly. Endotracheal nasogastric tubes are unchanged in position. Left subclavian catheter is unchanged in position. No pneumothorax is noted. Stable bilateral lung opacities are noted most consistent with pulmonary edema. Small pleural effusions are noted. Atherosclerosis of thoracic aorta is noted. Bony thorax is unremarkable. IMPRESSION: Stable support apparatus. Stable bilateral lung opacities are noted most consistent with pulmonary edema with associated small pleural effusions. Aortic Atherosclerosis (ICD10-I70.0). Electronically Signed   By: Marijo Conception, M.D.   On: 02/23/2019 07:19   Dg Chest Port 1 View  Result Date: 02/22/2019 CLINICAL DATA:  Endotracheal tube. EXAM: PORTABLE CHEST 1 VIEW COMPARISON:  Radiograph of same day. FINDINGS: Stable cardiomediastinal silhouette. Atherosclerosis of thoracic aorta is noted. Endotracheal and nasogastric tubes are unchanged in position. Left subclavian catheter is unchanged in position. Stable diffuse bilateral lung opacities are noted most consistent with pulmonary edema and associated pleural effusions. No pneumothorax is noted. Bony thorax is unremarkable. IMPRESSION: Stable support apparatus. Stable bilateral lung opacities are noted most consistent with pulmonary edema and associated pleural effusions.  Electronically Signed   By: Marijo Conception, M.D.   On: 02/22/2019 15:13   Dg Chest Port 1 View  Result Date: 02/22/2019 CLINICAL DATA:  Initial evaluation for endotracheal tube placement. EXAM: PORTABLE CHEST 1 VIEW COMPARISON:  Prior radiograph from 02/21/2019. FINDINGS: Endotracheal tube in place with tip positioned 4.3 cm above the carina. Enteric tube in place with tip overlying the proximal stomach, side hole in the distal esophagus. Left subclavian land approach central venous catheter in stable position with tip overlying the right atrium. Stable cardiomegaly. Mediastinal silhouette normal. Aortic atherosclerosis. Lungs are hypoinflated. Small right  greater than left pleural effusions. Persistent moderate diffuse pulmonary edema, improved from previous. Associated dense bibasilar opacities favored to reflect atelectasis, although infiltrates may be present as well. No pneumothorax. Osseous structures unchanged. IMPRESSION: 1. Tip of endotracheal tube position 4.3 cm above the carina. 2. Enteric tube in place, tip in the proximal stomach, side hole in the distal esophagus. 3. Moderate diffuse pulmonary edema with small to moderate right greater than left pleural effusions, improved relative to previous. Superimposed bibasilar opacities could reflect atelectasis or infiltrates. Electronically Signed   By: Jeannine Boga M.D.   On: 02/22/2019 05:54    Cardiac Studies   Echocardiogram March 2020- 1. The left ventricle has moderately reduced systolic function, with an ejection fraction of 35-40%. The cavity size was normal. Left ventricular diastolic Doppler parameters are consistent with pseudonormalization. Elevated left ventricular end-diastolic pressure The E/e' is 47. Regional wall motion abnormalities (see coded diagram below). 2. The right ventricle has mildly reduced systolic function. The cavity was normal. There is no increase in right ventricular wall thickness. 3. No  hemodynamically significant valvular heart disease. 4. Normal biatrial chamber size. 5. IVC not well visualized.  Cardiac catheterization March 2020-  2nd Mrg-3 lesion is 80% stenosed.  Acute Mrg lesion is 90% stenosed.   Less than 25% left main  Proximal diffuse 30 to 40% narrowing within the previously placed LAD stent. The entire apical LAD is diffusely diseased with up to 85% stenosis. The first diagonal contains 85% ostial narrowing.  Circumflex contains diffuse proximal to mid eccentric 50% stenosis the dominant second obtuse marginal contains mid vessel 50% stenosis in the mid to distal portion of this obtuse marginal there is a patent stent with 20% narrowing, and just distal to the stent there is an 80% thrombotic stenosis.  The RCA is dominant containing 50% mid vessel stenosis, 70% distal stenosis, 70% ostial PDA stenosis, and total occlusion of the mid vessel that fills by collaterals from the left ventricular apex.  Left ventriculography is not performed. Today's echo was reviewed and there is anteroapical severe hypokinesis and estimated ejection fraction of 40%. LVEDP by Cath Lab hemodynamics is 40 mmHg.  RECOMMENDATIONS:   Acute on chronic combined systolic and diastolic heart failure with extremely elevated LVEDP greater than or equal to 40 mmHg related to global ischemia in the setting of norepinephrine therapy.  Recommend clopidogrel therapy if possible given stents in multiple territories and evidence of active thrombus beyond the stent in the obtuse marginal.  Current anatomy does not suggest that a role for coronary intervention.  35 cc of contrast used. Given diabetes/shock state/intrinsic kidney disease, a bump in creatinine is still highly likely. Kidney function needs to be monitored.  Patient Profile     73 y.o. female visiting from [place] admitted March 10 with fever, hypotension and found to have group A strep bacteremia requiring  intubation. Also ruled in for non-ST elevation myocardial infarction presently being treated medically. Had cath and medical therapy recommended.Blood cultures March 10 showed group A strep. Respiratory virus panel showed rhinovirus. Influenza A and B negative. Coronavirus negative. Note LV dysfunction possibly secondary to stress cardiomyopathy.  Assessment & Plan    1. Paroxysmal atrial fibrillation-Pt remains in sinus rhythm. Continue amiodarone(change to 200 mg daily in one week)and low dose coreg. As outlined previously my hope is that her atrial fibrillation is related to acute illness and long-term amiodarone will not be required. Continue subcutaneous heparin.   2. NSTEMI/coronary artery disease: Plan is for medical  therapy. Continue aspirin, carvedilol, and Plavix. Add statin when extubated.  3. Cardiomyopathy: Question ischemic versus stress-induced cardiomyopathy. Continue hydralazine/nitrates (not a candidate for ARB or Entresto given severity of renal insufficiency). Continue low-dose carvedilol. Will not advance in the setting of acute CHF.   4. Acute combined systolic/diastolic congestive heart failure: Diuresing well. CVP 7 today. Coox 60.8, 67.1 yesterday. I will reduce milrinone to 0.125 mcg/kg and possibly dc on 3/30. Will recheck CVP in 2 hours. On IV Lasix 40 mg bid; follow renal function.Creatinine 1.46 today.  5. Pneumonia-antibiotics per CCM.  6. Ventilator dependent respiratory failure-vent wean per critical care medicine.  7. Acute on chronc stage 3 kidney disease-Renal function stable, Scr 1.46 today.   For questions or updates, please contact [place] Please consult [email or web address] for contact info under Cardiology/STEMI.      Signed, Kate Sable, MD  02/23/2019, 9:44 AM

## 2019-02-24 ENCOUNTER — Inpatient Hospital Stay (HOSPITAL_COMMUNITY): Payer: Medicaid Other

## 2019-02-24 ENCOUNTER — Inpatient Hospital Stay: Payer: Self-pay

## 2019-02-24 DIAGNOSIS — J810 Acute pulmonary edema: Secondary | ICD-10-CM

## 2019-02-24 DIAGNOSIS — J189 Pneumonia, unspecified organism: Secondary | ICD-10-CM

## 2019-02-24 LAB — BASIC METABOLIC PANEL
Anion gap: 11 (ref 5–15)
BUN: 75 mg/dL — ABNORMAL HIGH (ref 8–23)
CO2: 33 mmol/L — ABNORMAL HIGH (ref 22–32)
Calcium: 9 mg/dL (ref 8.9–10.3)
Chloride: 104 mmol/L (ref 98–111)
Creatinine, Ser: 1.39 mg/dL — ABNORMAL HIGH (ref 0.44–1.00)
GFR calc Af Amer: 44 mL/min — ABNORMAL LOW (ref >60)
GFR, EST NON AFRICAN AMERICAN: 38 mL/min — AB (ref >60)
Glucose, Bld: 129 mg/dL — ABNORMAL HIGH (ref 70–99)
Potassium: 3.6 mmol/L (ref 3.5–5.1)
SODIUM: 148 mmol/L — AB (ref 135–145)

## 2019-02-24 LAB — CBC
HCT: 34.3 % — ABNORMAL LOW (ref 36.0–46.0)
Hemoglobin: 9.6 g/dL — ABNORMAL LOW (ref 12.0–15.0)
MCH: 24.8 pg — ABNORMAL LOW (ref 26.0–34.0)
MCHC: 28 g/dL — ABNORMAL LOW (ref 30.0–36.0)
MCV: 88.6 fL (ref 80.0–100.0)
PLATELETS: 465 10*3/uL — AB (ref 150–400)
RBC: 3.87 MIL/uL (ref 3.87–5.11)
RDW: 20 % — ABNORMAL HIGH (ref 11.5–15.5)
WBC: 12.6 10*3/uL — AB (ref 4.0–10.5)
nRBC: 0 % (ref 0.0–0.2)

## 2019-02-24 LAB — GLUCOSE, CAPILLARY
GLUCOSE-CAPILLARY: 114 mg/dL — AB (ref 70–99)
GLUCOSE-CAPILLARY: 118 mg/dL — AB (ref 70–99)
GLUCOSE-CAPILLARY: 83 mg/dL (ref 70–99)
Glucose-Capillary: 114 mg/dL — ABNORMAL HIGH (ref 70–99)
Glucose-Capillary: 155 mg/dL — ABNORMAL HIGH (ref 70–99)
Glucose-Capillary: 142 mg/dL — ABNORMAL HIGH (ref 70–99)

## 2019-02-24 LAB — COOXEMETRY PANEL
Carboxyhemoglobin: 1.8 % — ABNORMAL HIGH (ref 0.5–1.5)
Methemoglobin: 1.2 % (ref 0.0–1.5)
O2 Saturation: 65.5 %
Total hemoglobin: 10.4 g/dL — ABNORMAL LOW (ref 12.0–16.0)

## 2019-02-24 MED ORDER — ETOMIDATE 2 MG/ML IV SOLN
INTRAVENOUS | Status: AC
  Filled 2019-02-24: qty 10

## 2019-02-24 MED ORDER — FENTANYL CITRATE (PF) 100 MCG/2ML IJ SOLN
INTRAMUSCULAR | Status: AC
  Filled 2019-02-24: qty 4

## 2019-02-24 MED ORDER — DEXMEDETOMIDINE HCL IN NACL 200 MCG/50ML IV SOLN: 0.0000 ug/kg/h | INTRAVENOUS | Status: DC

## 2019-02-24 MED ORDER — POTASSIUM CHLORIDE 10 MEQ/50ML IV SOLN
10.0000 meq | INTRAVENOUS | Status: AC
  Administered 2019-02-24 (×2): 10 meq via INTRAVENOUS
  Filled 2019-02-24 (×2): qty 50

## 2019-02-24 MED ORDER — DEXMEDETOMIDINE HCL IN NACL 200 MCG/50ML IV SOLN
0.0000 ug/kg/h | INTRAVENOUS | Status: AC
  Administered 2019-02-24 (×2): 0.4 ug/kg/h via INTRAVENOUS
  Administered 2019-02-25: 0.3 ug/kg/h via INTRAVENOUS
  Administered 2019-02-25: 0.101 ug/kg/h via INTRAVENOUS
  Administered 2019-02-26 – 2019-02-27 (×3): 0.1 ug/kg/h via INTRAVENOUS
  Filled 2019-02-24 (×7): qty 50

## 2019-02-24 MED ORDER — FUROSEMIDE 10 MG/ML IJ SOLN
INTRAMUSCULAR | Status: AC
  Filled 2019-02-24: qty 4

## 2019-02-24 MED ORDER — ETOMIDATE 2 MG/ML IV SOLN
20.0000 mg | Freq: Once | INTRAVENOUS | Status: AC
  Administered 2019-02-24: 20 mg via INTRAVENOUS

## 2019-02-24 MED ORDER — MIDAZOLAM HCL 2 MG/2ML IJ SOLN
2.0000 mg | Freq: Once | INTRAMUSCULAR | Status: AC
  Administered 2019-02-24: 2 mg via INTRAVENOUS

## 2019-02-24 MED ORDER — FENTANYL BOLUS VIA INFUSION
25.0000 ug | INTRAVENOUS | Status: DC | PRN
  Filled 2019-02-24: qty 25

## 2019-02-24 MED ORDER — FENTANYL CITRATE (PF) 100 MCG/2ML IJ SOLN
INTRAMUSCULAR | Status: AC
  Filled 2019-02-24: qty 2

## 2019-02-24 MED ORDER — MORPHINE 100MG IN NS 100ML (1MG/ML) PREMIX INFUSION
0.0000 mg/h | INTRAVENOUS | Status: DC
  Filled 2019-02-24: qty 100

## 2019-02-24 MED ORDER — MIDAZOLAM HCL 2 MG/2ML IJ SOLN
INTRAMUSCULAR | Status: AC
  Filled 2019-02-24: qty 2

## 2019-02-24 MED ORDER — POTASSIUM CHLORIDE 10 MEQ/50ML IV SOLN
10.0000 meq | INTRAVENOUS | Status: AC
  Administered 2019-02-24 (×3): 10 meq via INTRAVENOUS
  Filled 2019-02-24 (×3): qty 50

## 2019-02-24 MED ORDER — MORPHINE SULFATE (PF) 2 MG/ML IV SOLN
2.0000 mg | INTRAVENOUS | Status: DC | PRN
  Administered 2019-02-24 (×3): 2 mg via INTRAVENOUS
  Filled 2019-02-24 (×4): qty 1

## 2019-02-24 MED ORDER — CHLORHEXIDINE GLUCONATE 0.12% ORAL RINSE (MEDLINE KIT)
15.0000 mL | Freq: Two times a day (BID) | OROMUCOSAL | Status: DC
  Administered 2019-02-24 – 2019-03-27 (×57): 15 mL via OROMUCOSAL

## 2019-02-24 MED ORDER — MIDAZOLAM HCL 2 MG/2ML IJ SOLN: 1.0000 mg | Freq: Once | INTRAMUSCULAR | Status: DC

## 2019-02-24 MED ORDER — FUROSEMIDE 10 MG/ML IJ SOLN
40.0000 mg | Freq: Once | INTRAMUSCULAR | Status: AC
  Administered 2019-02-24: 40 mg via INTRAVENOUS

## 2019-02-24 MED ORDER — MORPHINE SULFATE (PF) 2 MG/ML IV SOLN
2.0000 mg | Freq: Once | INTRAVENOUS | Status: AC
  Administered 2019-02-24: 2 mg via INTRAVENOUS

## 2019-02-24 MED ORDER — POTASSIUM CHLORIDE 10 MEQ/100ML IV SOLN
10.0000 meq | INTRAVENOUS | Status: DC
  Administered 2019-02-24: 10 meq via INTRAVENOUS
  Filled 2019-02-24 (×2): qty 100

## 2019-02-24 MED ORDER — FENTANYL CITRATE (PF) 100 MCG/2ML IJ SOLN
50.0000 ug | Freq: Once | INTRAMUSCULAR | Status: AC
  Administered 2019-03-08: 50 ug via INTRAVENOUS
  Filled 2019-02-24 (×2): qty 2

## 2019-02-24 MED ORDER — FENTANYL 2500MCG IN NS 250ML (10MCG/ML) PREMIX INFUSION
0.0000 ug/h | INTRAVENOUS | Status: DC
  Administered 2019-02-24: 50 ug/h via INTRAVENOUS
  Administered 2019-02-25: 25 ug/h via INTRAVENOUS
  Filled 2019-02-24: qty 250

## 2019-02-24 MED ORDER — ROCURONIUM BROMIDE 50 MG/5ML IV SOLN
50.0000 mg | Freq: Once | INTRAVENOUS | Status: DC
  Filled 2019-02-24: qty 5

## 2019-02-24 MED ORDER — FENTANYL CITRATE (PF) 100 MCG/2ML IJ SOLN
100.0000 ug | Freq: Once | INTRAMUSCULAR | Status: AC
  Administered 2019-02-24: 100 ug via INTRAVENOUS

## 2019-02-24 MED ORDER — ORAL CARE MOUTH RINSE
15.0000 mL | OROMUCOSAL | Status: DC
  Administered 2019-02-24 – 2019-02-25 (×10): 15 mL via OROMUCOSAL

## 2019-02-24 MED ORDER — MORPHINE SULFATE (PF) 2 MG/ML IV SOLN: 1.0000 mg | Freq: Once | INTRAVENOUS | Status: DC

## 2019-02-24 MED ORDER — MIDAZOLAM HCL 2 MG/2ML IJ SOLN
INTRAMUSCULAR | Status: AC
  Filled 2019-02-24: qty 4

## 2019-02-24 NOTE — Procedures (Signed)
Extubation Procedure Note  Patient Details:   Name: Hessa Granieri DOB: [birth year] MRN: [id number]   Airway Documentation:    Vent end date: 02/24/19 Vent end time: 1133   Evaluation  O2 sats: stable throughout Complications: No apparent complications Patient did tolerate procedure well. Bilateral Breath Sounds: Rhonchi, Diminished   No   Patient was extubated to bipap per MD request. No stridor was noted. Cuff leak was heard. Patient appeared to be tolerating well at this time.  Danella Maiers Andreyah Natividad 02/24/2019, 11:13 AM

## 2019-02-24 NOTE — Progress Notes (Signed)
Notified by RN of increasing respiratory distress and worsening hypoxia, now on BiPAP 16/8, 80%   Blood pressure (!) 162/73, pulse 78, temperature 97.7 F (36.5 C), temperature source Oral, resp. rate (!) 26, height 5\' 6"  (1.676 m), weight 79.3 kg, SpO2 91 %.  General:  Alert female in mild resp distress HEENT: full bipap face mask Neuro: Awake CV: SR 80 PULM: moderate work of breathing on BiPAP, diffuse rales   Extremities: warm/dry  K 3.6 S/p lasix 40 meq this am with KCL runs x 3 runs.   P:  Lasix 40 meq now with additional 4 runs KCL  High risk for reintubation, will continue to monitor    [name], MSN, AGACNP-BC [facility] Pgr: [phone] or if no answer [phone] 02/24/2019, 3:23 PM

## 2019-02-24 NOTE — Progress Notes (Signed)
Patient continues to be restless, having increased oxygen requirements on bipap. Patient indicating pain, 2mg  of morphine given  [name], NP called at bedside Ordered 40mg  of Lasix  No improvement in respiratory status after lasix, NP called back to bedside for further assessment

## 2019-02-24 NOTE — Progress Notes (Signed)
OT Cancellation Note  Patient Details Name: Gabrielle Wright MRN: [id number] DOB: [birth year]   Cancelled Treatment:    Reason Eval/Treat Not Completed: Medical issues which prohibited therapy(just extubated and on bipap, will hold for tomorrow)  [facility] 02/24/2019, 11:50 AM   Sherryl Manges OTR/L Acute Rehabilitation Services Pager: [phone] Office: [phone]

## 2019-02-24 NOTE — Progress Notes (Signed)
Pt becoming increasingly agitated; repeatedly removing bipap mask and throwing it, removing pulse ox and leads, and hitting nursing staff. Attempted to redirect pt and address any needs; pt continued to be agitated; order placed for tele-sitter.

## 2019-02-24 NOTE — Progress Notes (Addendum)
Pt continues removing bipap mask even w/ tele-sitter; she is not redirectable. Placed on 15 L HFNC. O2 sat 96%. Breathing unlabored. MD paged. Awaiting return call.   1537- O2 sat dropped to low 80s when on 15 L HFNC- pt became tachypneic. Spoke with patient's daughter who was able to get pt to agree to go back on Bipap. Respiratory called to bedside- O2 sat 86% even with bipap. FiO2 adjusted to 80%- sats improved to >92.

## 2019-02-24 NOTE — Progress Notes (Signed)
PT Cancellation Note  Patient Details Name: Katryn Baby MRN: [id number] DOB: 1946/01/23   Cancelled Treatment:    Reason Eval/Treat Not Completed: Patient not medically ready(just extubated to bipap at this time, will hold)   Fabio Asa 02/24/2019, 11:46 AM Charlotte Crumb, PT DPT  Acute Rehabilitation Services Pager [phone] Office [phone]

## 2019-02-24 NOTE — Progress Notes (Signed)
NAME:  Gabrielle Wright, MRN:  [id number], DOB:  [birth year], LOS: 20 ADMISSION DATE:  02/03/2019, CONSULTATION DATE: 02/04/2019 REFERRING MD: Emergency department physician cHIEF COMPLAINT: Fever respiratory failure  Brief History   73 year old elderly woman visiting from [place], admitted 3/10 with fevers, hypotension, found to have group A strep bacteremia, required mechanical ventilation Course complicated by non-STEMI and new LV dysfunction ? Ischemic vs Takatsubo's Failed extubation 3/18 due to acute pulmonary edema  Past Medical History  Hypertension Coronary artery disease Suspected diabetes review of her arterial consult receiving correct.  Significant Hospital IsEvents   02/04/2019 transfer from [facility] to [facility] required intubation and pressors. 3/16 off Levophed 3/18 extubated briefly but reintubated due to pulmonary edema 3/19 started milrinone 3/20 Episode of respiratory distress,  Precedex changed to propofol Atrial fibrillation RVR >>amiodarone bolus >> sinus rhythm 3/21 rising creat , AF-RVR >> amio bolus 3/24:rising creatinine,     Consults:  02/04/2019 ID   Procedures:  02/04/2019 intubation>> 3/18, 3/18 >> 02/04/2019 CVL>>  Significant Diagnostic Tests:  Echo 3/15 decreased EF 35 to 40%, LVEDP 27, no significant MR Cardiac cath >> LVEDP 40, RCA totally occluded, patent OM1 stent but distally occluded, apical LAD diffusely diseased, no intervention Chest x-ray 3/22  bilateral lower lobe airspace disease versus layering effusions CXR 3/23> pulmonary edema and bilateral pleural effusions. Personally reviewed   Micro Data:  02/04/2019 blood cultures x2>> Gr A strep 02/04/2019 sputum culture>> neg 02/04/2019 urine culture>>neg 02/04/2019 respiratory virus panel>> rhinovirus 02/04/2019 flu a and B>> neg coronavirus testing 02/04/2019>>neg resp 3/21 >> Klebsiella oxytoca, co-ag negative staph  Antimicrobials:  02/04/2019 vancomycin>>3/ 10  02/04/2019 Zosyn>> 3/10 Pen G 3/10 >> 3/22 (plan) ceftx 3/20 >>3/24 Cipro 3/24>>   Interim history/subjective:   Afebrile Diuresing well with Lasix Able to video conference with daughter  Objective   Blood pressure (!) 111/55, pulse 68, temperature 97.7 F (36.5 C), temperature source Oral, resp. rate 12, height 5\' 6"  (1.676 m), weight 79.3 kg, SpO2 100 %. CVP:  [4 mmHg-10 mmHg] 7 mmHg  Vent Mode: PRVC FiO2 (%):  [40 %] 40 % Set Rate:  [12 bmp] 12 bmp Vt Set:  [450 mL] 450 mL PEEP:  [5 cmH20] 5 cmH20 Plateau Pressure:  [14 cmH20-22 cmH20] 19 cmH20   Intake/Output Summary (Last 24 hours) at 02/24/2019 1025 Last data filed at 02/24/2019 9379 Gross per 24 hour  Intake 2110.36 ml  Output 1875 ml  Net 235.36 ml   Filed Weights   02/22/19 0500 02/23/19 0240 02/24/19 0446  Weight: 80.4 kg 80.4 kg 79.3 kg   Examination: Chronically ill-appearing, no distress Mild pallor, no icterus, no JVD No edema S1-S2 regular, sinus on monitor Decreased breath sounds bilateral, no crackles or rhonchi Soft nontender abdomen Eyes open, follows one-step commands, language barrier No rash  Chest x-ray 3/30 personally reviewed which shows bilateral effusions/lower lobe airspace disease improved    Resolved Hospital Problem list   Hyperkalemia  LLQ abdominal pain-KUB 3/25 neg   Assessment & Plan:   Septic shock/group A strep bacteremia HCAP G neg, Sputum Culture 3/21>> + for Klebsiella Oxytoca and rare staph  Lugdenensis - Viral testing - negative for COVID, positive for rhino  -Off pressors, changed from penicillin to ceftriaxone Now on Cipro for H CAP  Acute respiratory failure requiring mechanical ventilation -Pulmonary edema continues to be significant limiting factor -pleural effusions  P Tolerating pressure support with good tidal volumes on 10/ Trial of extubation to BiPAP today  Acute Kidney Injury-improving  -etiology likely multifactorial-- Sepsis, ARB, now related  to diuresis Hyperphosphatemia Hypermagnesemia-resolved  Hypokalemia in setting of diuresis  -improved  P Continue current dose of lasix Free water Follow BMP, urine output, CVP Replace electrolytes as indicated  Ischemic Cardiomyopathy/ LVEDP  - with no targets for revascularization on cath C0-ox 60% on 3/24 Echo 3/26 EF 35-40%, LV diffuse hypokinesis  P Cards following  Continue aspirin, Plavix Continue Bidil and Coreg  Cont Lasix    Atrial Fibrillation with RVR Short run 3/24>> Resolved with suction P Remains on amiodarone, carvedilol Continue telemetry   Diabetes type 2/uncontrolled hyperglycemia -Was on high dose of 70/30 at home P Resistant sliding scale insulin Levemir 65 twice daily  dc tube feed coverage if extubated  Abdominal distention/ Ileus -improved P Minimize narcotics Bowel regimen as ordered  Acute encephalopathy-intermittent severe agitation, worsened by language barrier  P -RASS goal 0 to -1 Wean fent as able  Family video conference in room seems to help   Acute Anemia -anemia of critical illness,no evidence of blood loss - s/p 1 PRBC 3/21, 1 PRBC 3/22 P Following CBC Goal Hgb > 8    GI ppx: Protonix VAP: Continue Pain/Sedation: fent  DVT ppx: Heparin SQ  Diet: Enteral Nutrition Code Status: full Family Communication: Discussed with daughter and son-in-law 3/30, they are agreeable to tracheostomy if she fails extubation    Summary-acute pulmonary edema seems to have resolved, heart failure is optimized, H CAP has been treated the best possible, seems to be the best window for extubation, if she fails then will need tracheostomy.   The patient is critically ill with multiple organ systems failure and requires high complexity decision making for assessment and support, frequent evaluation and titration of therapies, application of advanced monitoring technologies and extensive interpretation of multiple databases. Critical Care Time  devoted to patient care services described in this note independent of APP/resident  time is 35 minutes.   Cyril Mourning MD. Tonny Bollman. [facility] Pager [phone] If no response call [phone]   02/24/2019    02/24/2019

## 2019-02-24 NOTE — Progress Notes (Signed)
LaDonna,RN is aware of the discontinue central line order.  Penni Bombard Zaydan Papesh,RN-VAST

## 2019-02-24 NOTE — Progress Notes (Signed)
Pt awake and restless had hands around ETT. Note the NGT lying on the bed. Meds adjusted for pt's comfort and 1:1 care initiated to maintain pt safety.

## 2019-02-24 NOTE — Progress Notes (Signed)
Assessed patient for PICC insertion.  Unable to find any suitable vein to insert PICC.  Veins are too small.  Left arm cephalic vein PICC would occupy 82% of vein; Left brachial vein would occupy 76% of vein; Left basilic vein would occupy over 100%.  Right arm is restricted but assess also finding all veins similar and too small for PICC insertion.  If still desire PICC consider referral to interventional radiology

## 2019-02-24 NOTE — Progress Notes (Signed)
Progress Note  Patient Name: Gabrielle Wright Date of Encounter: 02/24/2019  Primary Cardiologist: Minus Breeding, MD   Subjective   Intubated; opens eyes and moves all ext  Inpatient Medications    Scheduled Meds: . amiodarone  400 mg Per Tube BID  . aspirin  81 mg Oral Daily  . carvedilol  3.125 mg Per NG tube BID WC  . chlorhexidine gluconate (MEDLINE KIT)  15 mL Mouth Rinse BID  . Chlorhexidine Gluconate Cloth  6 each Topical Q0600  . clopidogrel  75 mg Oral Daily  . feeding supplement (PRO-STAT SUGAR FREE 64)  30 mL Per Tube BID  . feeding supplement (VITAL AF 1.2 CAL)  1,000 mL Per Tube Q24H  . free water  250 mL Per Tube Q6H  . furosemide  40 mg Intravenous BID  . heparin  5,000 Units Subcutaneous Q8H  . insulin aspart  0-24 Units Subcutaneous Q4H  . insulin aspart  7 Units Subcutaneous Q4H  . insulin detemir  65 Units Subcutaneous BID  . isosorbide-hydrALAZINE  1 tablet Oral TID  . mouth rinse  15 mL Mouth Rinse 10 times per day  . multivitamin  15 mL Per Tube Daily  . pantoprazole sodium  40 mg Per Tube QHS  . polyethylene glycol  17 g Oral Daily  . sodium chloride flush  10-40 mL Intracatheter Q12H   Continuous Infusions: . sodium chloride 10 mL/hr at 02/24/19 0700  . ciprofloxacin Stopped (02/24/19 0110)  . fentaNYL infusion INTRAVENOUS 400 mcg/hr (02/24/19 0700)  . milrinone 0.125 mcg/kg/min (02/24/19 0700)  . propofol (DIPRIVAN) infusion Stopped (02/20/19 0705)   PRN Meds: acetaminophen (TYLENOL) oral liquid 160 mg/5 mL, acetaminophen, bisacodyl, dextrose, diphenhydrAMINE, fentaNYL, fentaNYL (SUBLIMAZE) injection, fentaNYL (SUBLIMAZE) injection, hydrALAZINE, ipratropium-albuterol, midazolam, ondansetron (ZOFRAN) IV, sennosides, sodium chloride flush   Vital Signs    Vitals:   02/24/19 0446 02/24/19 0500 02/24/19 0600 02/24/19 0700  BP:  (!) 126/48 (!) 105/47 (!) 131/58  Pulse:  79 65 73  Resp:  _0 Temp:      TempSrc:      SpO2:  100% 97% 100%   Weight: 79.3 kg     Height:        Intake/Output Summary (Last 24 hours) at 02/24/2019 0741 Last data filed at 02/24/2019 0700 Gross per 24 hour  Intake 1998.46 ml  Output 2300 ml  Net -301.54 ml   Last 3 Weights 02/24/2019 02/23/2019 02/22/2019  Weight (lbs) 174 lb 13.2 oz 177 lb 4 oz 177 lb 4 oz  Weight (kg) 79.3 kg 80.4 kg 80.4 kg      Telemetry    NSR- Personally Reviewed  Physical Exam   GEN: intubated; opens eyes Neck: suple Cardiac:  RRR Respiratory: CTA anteriorly GI: Soft,ND, mild tenderness MS: no edema Neuro:  Moves ext; opens eyes   Labs    Chemistry Recent Labs  Lab 02/22/19 [phone]  02/22/19 1440 02/23/19 0355 02/24/19 0425  NA 147*   < > 147* 148* 148*  K 3.7   < > 3.9 3.7 3.6  CL 104  --   --  103 104  CO2 33*  --   --  35* 33*  GLUCOSE 184*  --   --  144* 129*  BUN 79*  --   --  75* 75*  CREATININE 1.41*  --   --  1.46* 1.39*  CALCIUM 9.3  --   --  9.5 9.0  GFRNONAA 37*  --   --  36* 38*  GFRAA 43*  --   --  41* 44*  ANIONGAP 10  --   --  10 11   < > = values in this interval not displayed.     Hematology Recent Labs  Lab 02/22/19 0551  02/22/19 1440 02/23/19 0355 02/24/19 0425  WBC 10.6*  --   --  13.1* 12.6*  RBC 3.78*  --   --  3.92 3.87  HGB 9.1*   < > 10.5* 9.6* 9.6*  HCT 33.0*   < > 31.0* 34.7* 34.3*  MCV 87.3  --   --  88.5 88.6  MCH 24.1*  --   --  24.5* 24.8*  MCHC 27.6*  --   --  27.7* 28.0*  RDW 20.5*  --   --  20.2* 20.0*  PLT 460*  --   --  477* 465*   < > = values in this interval not displayed.     BNP Recent Labs  Lab 02/20/19 0905 02/21/19 0403  BNP 1,046.8* 935.7*    Radiology    Dg Abd 1 View  Result Date: 02/22/2019 CLINICAL DATA:  Nasogastric tube placement. EXAM: ABDOMEN - 1 VIEW COMPARISON:  Radiograph February 19, 2019. FINDINGS: The bowel gas pattern is normal. Distal tip of nasogastric tube is seen in proximal stomach. No radio-opaque calculi or other significant radiographic abnormality are seen.  IMPRESSION: Distal tip of nasogastric tube seen in proximal stomach. No evidence of bowel obstruction or ileus. Electronically Signed   By: Marijo Conception, M.D.   On: 02/22/2019 15:15   Dg Chest Port 1 View  Result Date: 02/23/2019 CLINICAL DATA:  Respiratory failure. EXAM: PORTABLE CHEST 1 VIEW COMPARISON:  Radiograph February 22, 2019. FINDINGS: Stable cardiomegaly. Endotracheal nasogastric tubes are unchanged in position. Left subclavian catheter is unchanged in position. No pneumothorax is noted. Stable bilateral lung opacities are noted most consistent with pulmonary edema. Small pleural effusions are noted. Atherosclerosis of thoracic aorta is noted. Bony thorax is unremarkable. IMPRESSION: Stable support apparatus. Stable bilateral lung opacities are noted most consistent with pulmonary edema with associated small pleural effusions. Aortic Atherosclerosis (ICD10-I70.0). Electronically Signed   By: Marijo Conception, M.D.   On: 02/23/2019 07:19   Dg Chest Port 1 View  Result Date: 02/22/2019 CLINICAL DATA:  Endotracheal tube. EXAM: PORTABLE CHEST 1 VIEW COMPARISON:  Radiograph of same day. FINDINGS: Stable cardiomediastinal silhouette. Atherosclerosis of thoracic aorta is noted. Endotracheal and nasogastric tubes are unchanged in position. Left subclavian catheter is unchanged in position. Stable diffuse bilateral lung opacities are noted most consistent with pulmonary edema and associated pleural effusions. No pneumothorax is noted. Bony thorax is unremarkable. IMPRESSION: Stable support apparatus. Stable bilateral lung opacities are noted most consistent with pulmonary edema and associated pleural effusions. Electronically Signed   By: Marijo Conception, M.D.   On: 02/22/2019 15:13    Cardiac Studies   Echocardiogram March 2020-  1. The left ventricle has moderately reduced systolic function, with an ejection fraction of 35-40%. The cavity size was normal. Left ventricular diastolic Doppler  parameters are consistent with pseudonormalization. Elevated left ventricular end-diastolic pressure The E/e' is 88. Regional wall motion abnormalities (see coded diagram below).  2. The right ventricle has mildly reduced systolic function. The cavity was normal. There is no increase in right ventricular wall thickness.  3. No hemodynamically significant valvular heart disease.  4. Normal biatrial chamber size.  5. IVC not well visualized.  Cardiac catheterization March 2020-  2nd  Mrg-3 lesion is 80% stenosed.  Acute Mrg lesion is 90% stenosed.    Less than 25% left main  Proximal diffuse 30 to 40% narrowing within the previously placed LAD stent.  The entire apical LAD is diffusely diseased with up to 85% stenosis.  The first diagonal contains 85% ostial narrowing.  Circumflex contains diffuse proximal to mid eccentric 50% stenosis the dominant second obtuse marginal contains mid vessel 50% stenosis in the mid to distal portion of this obtuse marginal there is a patent stent with 20% narrowing, and just distal to the stent there is an 80% thrombotic stenosis.  The RCA is dominant containing 50% mid vessel stenosis, 70% distal stenosis, 70% ostial PDA stenosis, and total occlusion of the mid vessel that fills by collaterals from the left ventricular apex.  Left ventriculography is not performed.  Today's echo was reviewed and there is anteroapical severe hypokinesis and estimated ejection fraction of 40%.  LVEDP by Cath Lab hemodynamics is 40 mmHg.  RECOMMENDATIONS:   Acute on chronic combined systolic and diastolic heart failure with extremely elevated LVEDP greater than or equal to 40 mmHg related to global ischemia in the setting of norepinephrine therapy.  Recommend clopidogrel therapy if possible given stents in multiple territories and evidence of active thrombus beyond the stent in the obtuse marginal.  Current anatomy does not suggest that a role for coronary intervention.   35 cc of contrast used.  Given diabetes/shock state/intrinsic kidney disease, a bump in creatinine is still highly likely.  Kidney function needs to be monitored.  Patient Profile     73 y.o. female visiting from [place] admitted March 10 with fever, hypotension and found to have group A strep bacteremia requiring intubation.  Also ruled in for non-ST elevation myocardial infarction. Had cath and medical therapy recommended. Blood cultures March 10 showed group A strep.  Respiratory virus panel showed rhinovirus.  Influenza A and B negative.  Coronavirus negative.  Note LV dysfunction possibly secondary to stress cardiomyopathy.  Assessment & Plan    1 paroxysmal atrial fibrillation-patient continues with sinus rhythm today.  We will continue amiodarone at present dose and decrease to 200 mg daily in approximately 3 days.  As outlined previously my hope is that her atrial fibrillation is related to acute illness and long-term amiodarone will not be required.  Continue subcutaneous heparin.    2 NSTEMI/coronary artery disease-we are treating medically.  Continue aspirin and Plavix.  Add statin once extubated.  3 cardiomyopathy-question ischemic versus stress-induced cardiomyopathy.  Continue hydralazine/nitrates (not a candidate for ARB or Entresto given severity of renal insufficiency).  Continue low-dose carvedilol.    4 acute combined systolic/diastolic congestive heart failure-I/O - 301. Coox 65.5. Last CVP 7.  DC milrinone.  Continue Lasix 40 mg IV twice daily.  She appears to be euvolemic this morning.  5 pneumonia-management per critical care medicine.  6 ventilator dependent respiratory failure-vent wean per critical care medicine.  7 acute on chronc stage 3 kidney disease-Renal function with some improvement over the weekend.  Continue to follow.  For questions or updates, please contact [place] Please consult [email or web address] for contact info under        Signed, Kirk Ruths, MD  02/24/2019, 7:41 AM

## 2019-02-24 NOTE — Progress Notes (Addendum)
She was extubated to BiPAP with plan for reintubation and trach if she fails as discussed with her's daughter and son-in-law. However she developed respiratory distress and required BiPAP again. After speaking with the patient, she did not want reintubation.  We discussed with interpreter, she clearly seem to understand that alternative would be death. I have called the daughter, issued DNR 4 mg morphine given in divided doses for comfort.  Once daughter arrives, will transition to full comfort   Addendum - daughter arriveed, after further discussion with family, rescinded DNR, full code &agreebale to trach. Confirmed independently via interpreter Proceed with reintubation, vent/sedation orders given  Addn cc time x 60 mins  Rakesh V. Vassie Loll MD

## 2019-02-24 NOTE — Progress Notes (Signed)
Chaplain paged and upon arrival the physician said not needed.  Phebe Colla, Chaplain   02/24/19 1800  Clinical Encounter Type  Visited With Patient not available  Visit Type Other (Comment) (chaplain paged)  Referral From Nurse  Consult/Referral To Chaplain

## 2019-02-24 NOTE — Progress Notes (Signed)
Family at the bedside made the decision to re-intubate and perform a tracheostomy at a later date. Dr. Vassie Loll performed the intubation without complication.NGT placed to low intermittent suction by RN

## 2019-02-25 ENCOUNTER — Inpatient Hospital Stay (HOSPITAL_COMMUNITY): Payer: Medicaid Other

## 2019-02-25 LAB — BASIC METABOLIC PANEL
ANION GAP: 13 (ref 5–15)
BUN: 86 mg/dL — ABNORMAL HIGH (ref 8–23)
CO2: 32 mmol/L (ref 22–32)
Calcium: 9 mg/dL (ref 8.9–10.3)
Chloride: 104 mmol/L (ref 98–111)
Creatinine, Ser: 1.56 mg/dL — ABNORMAL HIGH (ref 0.44–1.00)
GFR calc Af Amer: 38 mL/min — ABNORMAL LOW (ref >60)
GFR calc non Af Amer: 33 mL/min — ABNORMAL LOW (ref >60)
Glucose, Bld: 107 mg/dL — ABNORMAL HIGH (ref 70–99)
Potassium: 4.5 mmol/L (ref 3.5–5.1)
Sodium: 149 mmol/L — ABNORMAL HIGH (ref 135–145)

## 2019-02-25 LAB — GLUCOSE, CAPILLARY
Glucose-Capillary: 100 mg/dL — ABNORMAL HIGH (ref 70–99)
Glucose-Capillary: 131 mg/dL — ABNORMAL HIGH (ref 70–99)
Glucose-Capillary: 132 mg/dL — ABNORMAL HIGH (ref 70–99)
Glucose-Capillary: 133 mg/dL — ABNORMAL HIGH (ref 70–99)
Glucose-Capillary: 161 mg/dL — ABNORMAL HIGH (ref 70–99)
Glucose-Capillary: 61 mg/dL — ABNORMAL LOW (ref 70–99)
Glucose-Capillary: 65 mg/dL — ABNORMAL LOW (ref 70–99)
Glucose-Capillary: 79 mg/dL (ref 70–99)
Glucose-Capillary: 94 mg/dL (ref 70–99)

## 2019-02-25 LAB — CBC
HCT: 36 % (ref 36.0–46.0)
Hemoglobin: 10.4 g/dL — ABNORMAL LOW (ref 12.0–15.0)
MCH: 25.1 pg — ABNORMAL LOW (ref 26.0–34.0)
MCHC: 28.9 g/dL — ABNORMAL LOW (ref 30.0–36.0)
MCV: 87 fL (ref 80.0–100.0)
Platelets: 447 10*3/uL — ABNORMAL HIGH (ref 150–400)
RBC: 4.14 MIL/uL (ref 3.87–5.11)
RDW: 19.9 % — ABNORMAL HIGH (ref 11.5–15.5)
WBC: 18.7 10*3/uL — ABNORMAL HIGH (ref 4.0–10.5)
nRBC: 0 % (ref 0.0–0.2)

## 2019-02-25 LAB — MAGNESIUM: Magnesium: 2.8 mg/dL — ABNORMAL HIGH (ref 1.7–2.4)

## 2019-02-25 LAB — PHOSPHORUS: Phosphorus: 5.1 mg/dL — ABNORMAL HIGH (ref 2.5–4.6)

## 2019-02-25 LAB — PROTIME-INR
INR: 1.3 — AB (ref 0.8–1.2)
PROTHROMBIN TIME: 15.5 s — AB (ref 11.4–15.2)

## 2019-02-25 MED ORDER — FREE WATER
250.0000 mL | Freq: Four times a day (QID) | Status: DC
  Administered 2019-02-25 – 2019-03-06 (×35): 250 mL

## 2019-02-25 MED ORDER — INSULIN DETEMIR 100 UNIT/ML ~~LOC~~ SOLN
32.0000 [IU] | Freq: Two times a day (BID) | SUBCUTANEOUS | Status: DC
  Administered 2019-02-25 – 2019-02-27 (×4): 32 [IU] via SUBCUTANEOUS
  Filled 2019-02-25 (×6): qty 0.32

## 2019-02-25 MED ORDER — VITAL AF 1.2 CAL PO LIQD
1000.0000 mL | ORAL | Status: DC
  Administered 2019-02-25 – 2019-02-27 (×3): 1000 mL

## 2019-02-25 MED ORDER — ROCURONIUM BROMIDE 50 MG/5ML IV SOLN: 50.0000 mg | Freq: Once | INTRAVENOUS | Status: DC

## 2019-02-25 MED ORDER — MIDAZOLAM HCL 2 MG/2ML IJ SOLN
INTRAMUSCULAR | Status: AC
  Administered 2019-02-25: 2 mg
  Filled 2019-02-25: qty 2

## 2019-02-25 MED ORDER — ORAL CARE MOUTH RINSE
15.0000 mL | Freq: Two times a day (BID) | OROMUCOSAL | Status: DC
  Administered 2019-02-26 – 2019-03-04 (×13): 15 mL via OROMUCOSAL

## 2019-02-25 MED ORDER — DEXTROSE 10 % IV SOLN
INTRAVENOUS | Status: DC
  Administered 2019-02-25: 09:00:00 via INTRAVENOUS

## 2019-02-25 MED ORDER — FENTANYL CITRATE (PF) 100 MCG/2ML IJ SOLN
INTRAMUSCULAR | Status: AC
  Administered 2019-02-25: 16:00:00
  Filled 2019-02-25: qty 2

## 2019-02-25 NOTE — Progress Notes (Signed)
Inpatient Diabetes Program Recommendations  AACE/ADA: New Consensus Statement on Inpatient Glycemic Control (2015)  Target Ranges:  Prepandial:   less than 140 mg/dL      Peak postprandial:   less than 180 mg/dL (1-2 hours)      Critically ill patients:  140 - 180 mg/dL   Lab Results  Component Value Date   GLUCAP 79 02/25/2019   HGBA1C 10.1 (H) 02/04/2019    Review of Glycemic Control Results for Gabrielle Wright, Gabrielle Wright (MRN [id number]) as of 02/25/2019 14:43  Ref. Range 02/25/2019 04:30 02/25/2019 08:21 02/25/2019 08:56 02/25/2019 11:57  Glucose-Capillary Latest Ref Range: 70 - 99 mg/dL 436 (H) 65 (L) 067 (H) 79   Noted mild hypoglycemic event this AM of 65 mg/dL, this was following administration of tube feed coverage even though tube feeds were discontinued. Noted subsequent changes made to insulin to include reduction of Levemir to 32 units BID and holding tube feed coverage, in agreement. Will continue to follow. Lujean Rave, MSN, RNC-OB Diabetes Coordinator [phone] (8a-5p)

## 2019-02-25 NOTE — Procedures (Signed)
Intubation Procedure Note Raihana Balderrama [id number] 10/27/1946  Procedure: Intubation Indications: Respiratory insufficiency  Procedure Details Consent: Risks of procedure as well as the alternatives and risks of each were explained to the (patient/caregiver).  Consent for procedure obtained. Time Out: Verified patient identification, verified procedure, site/side was marked, verified correct patient position, special equipment/implants available, medications/allergies/relevent history reviewed, required imaging and test results available.  Performed  Maximum sterile technique was used including gloves, hand hygiene and mask.  MAC and 3  Versed 2  fent 100 Etomidate 20  Evaluation Hemodynamic Status: BP stable throughout; O2 sats: transiently fell during during procedure Patient's Current Condition: stable Complications: No apparent complications Patient did tolerate procedure well. Chest X-ray ordered to verify placement.  CXR: tube position acceptable.   [name] Elsworth Soho 02/25/2019

## 2019-02-25 NOTE — TOC Initial Note (Addendum)
Transition of Care [facility]) - Initial/Assessment Note    Patient Details  Name: Gabrielle Wright MRN: [id number] Date of Birth: December 07, 1945  [facility] CM/SW Contact:    Colleen Can RN, BSN, NCM-BC, ACM-RN [phone] Phone Number: 02/25/2019, 3:50 PM  Clinical Narrative:                 CM consult for CM assistance for transitional needs after trach placement is acknowledged. Patient remains acutely ill. POD#0 trach placement on vent support. CIR recommended on 02/19/19; awaiting post procedural PT/OT notes for updated dispositional recommendations. CM team will continue to follow and assist for DCP needs.   Expected Discharge Plan: IP Rehab Facility Barriers to Discharge: Continued Medical Work up   Patient Goals and CMS Choice        Expected Discharge Plan and Services Expected Discharge Plan: IP Rehab Facility   Discharge Planning Services: CM Consult                              Prior Living Arrangements/Services                       Activities of Daily Living Home Assistive Devices/Equipment: None ADL Screening (condition at time of admission) Patient's cognitive ability adequate to safely complete daily activities?: Yes Is the patient deaf or have difficulty hearing?: No Does the patient have difficulty seeing, even when wearing glasses/contacts?: No Does the patient have difficulty concentrating, remembering, or making decisions?: No Patient able to express need for assistance with ADLs?: Yes Does the patient have difficulty dressing or bathing?: No Independently performs ADLs?: Yes (appropriate for developmental age) Does the patient have difficulty walking or climbing stairs?: No Weakness of Legs: Both Weakness of Arms/Hands: Both  Permission Sought/Granted                  Emotional Assessment   Attitude/Demeanor/Rapport: Unable to Assess, Intubated (Following Commands or Not Following Commands) Affect (typically observed):  Unable to Assess        Admission diagnosis:  SIRS (systemic inflammatory response syndrome) (HCC) [R65.10] AKI (acute kidney injury) (HCC) [N17.9] Septic shock (HCC) [A41.9, R65.21] Sepsis (HCC) [A41.9] Patient Active Problem List   Diagnosis Date Noted  . HCAP (healthcare-associated pneumonia)   . Acute pulmonary edema (HCC)   . Abdominal distension (gaseous)   . Acute on chronic respiratory failure with hypoxia (HCC)   . AKI (acute kidney injury) (HCC)   . Septic shock (HCC)   . CAD (coronary artery disease), native coronary artery 02/09/2019  . 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 Sepsis (HCC) 02/04/2019  . Hypotension 02/04/2019  . Acute respiratory failure with hypoxemia (HCC)    PCP:  Truett Perna, MD Pharmacy:   [facility] [place], [place] - [address] [place] [address] Phone: [phone] Fax: [phone]     Social Determinants of Health (SDOH) Interventions    Readmission Risk Interventions No flowsheet data found.

## 2019-02-25 NOTE — Procedures (Signed)
Percutaneous Tracheostomy Placement  Consent from family.  Patient sedated, paralyzed and position.  Placed on 100% FiO2 and RR matched.  Area cleaned and draped.  Lidocaine/epi injected.  Skin incision done followed by blunt dissection.  Trachea palpated then punctured, catheter passed and visualized bronchoscopically.  Wire placed and visualized.  Catheter removed.  Airway then entered and dilated.  Size 6 cuffed shiley trach placed and visualized bronchoscopically well above carina.  Good volume returns.  Patient tolerated the procedure well without complications.  Minimal blood loss.  CXR ordered and pending.  Alyson Reedy, M.D. [facility] Pulmonary/Critical Care Medicine. Pager: [phone]. After hours pager: [phone]. h

## 2019-02-25 NOTE — Progress Notes (Signed)
73 year old diabetic with known CAD was admitted 3/10 with group A strep bacteremia and septic shock.  Course complicated by non-STEMI and new LV dysfunction?  Ischemic versus Takotsubo's.  She failed extubation 3/18.  She was optimized with milrinone, diuresis.  Course was complicated with atrial fibrillation/RVR requiring amiodarone and Klebsiella H CAP that has been treated with antibiotics.  Extubation was reattempted 3/30 but she failed again due to acute pulmonary edema.  She is now reintubated and sedated on Precedex and fentanyl drips, awake RA SS 0, follows commands, anxious affect, bilateral scattered crackles, S1-S2 regular, sinus on monitor, soft nontender, no edema.  Chest x-ray personally reviewed which shows ET tube in position and bilateral increased airspace disease and effusions in the pattern of edema.  Labs reviewed which show stable hemoglobin, mild leukocytosis, mild hypernatremia and stable renal function.  Impression/plan Acute hypoxic respiratory failure -proceed with tracheostomy today. Risks and benefits of this procedure have been extensively discussed with patient and family over the last 24 hours.  Klebsiella HCAP -complete ciprofloxacin for 8 days  Ischemic cardiomyopathy with no targets for revascularization-although EF is around 40% on echo, she seems to go into pulmonary edema immediately on withdrawal of positive pressure with suggests that LV function is worse. Continue Coreg and amiodarone, no other cardiac interventions other than starting ARB if renal function improves. Continue Lasix as renal function tolerates  Updated family in detail  The patient is critically ill with multiple organ systems failure and requires high complexity decision making for assessment and support, frequent evaluation and titration of therapies, application of advanced monitoring technologies and extensive interpretation of multiple databases. Critical Care Time devoted to patient  care services described in this note independent of APP/resident  time is 35 minutes.   Comer Locket Vassie Loll MD

## 2019-02-25 NOTE — Progress Notes (Signed)
NAME:  Gabrielle Wright, MRN:  [id number], DOB:  [birth year], LOS: 21 ADMISSION DATE:  02/03/2019, CONSULTATION DATE: 02/04/2019 REFERRING MD: Emergency department physician cHIEF COMPLAINT: Fever respiratory failure  Brief History   73 year old elderly woman visiting from [place], admitted 3/10 with fevers, hypotension, found to have group A strep bacteremia, required mechanical ventilation Course complicated by non-STEMI and new LV dysfunction ? Ischemic vs Takatsubo's Failed extubation 3/18 due to acute pulmonary edema  Past Medical History  Hypertension Coronary artery disease Suspected diabetes review of her arterial consult receiving correct.  Significant Hospital IsEvents   02/04/2019 transfer from [facility] to [facility] required intubation and pressors. 3/16 off Levophed 3/18 extubated briefly but reintubated due to pulmonary edema 3/19 started milrinone 3/20 Episode of respiratory distress,  Precedex changed to propofol Atrial fibrillation RVR >>amiodarone bolus >> sinus rhythm 3/21 rising creat , AF-RVR >> amio bolus 3/24:rising creatinine,    Consults:  02/04/2019 ID   Procedures:  02/04/2019 intubation>> 3/18, 3/18 >> 02/04/2019 CVL>>  Significant Diagnostic Tests:  Echo 3/15 decreased EF 35 to 40%, LVEDP 27, no significant MR Cardiac cath >> LVEDP 40, RCA totally occluded, patent OM1 stent but distally occluded, apical LAD diffusely diseased, no intervention Chest x-ray 3/22  bilateral lower lobe airspace disease versus layering effusions CXR 3/23> pulmonary edema and bilateral pleural effusions. Personally reviewed   Micro Data:  02/04/2019 blood cultures x2>> Gr A strep 02/04/2019 sputum culture>> neg 02/04/2019 urine culture>>neg 02/04/2019 respiratory virus panel>> rhinovirus 02/04/2019 flu a and B>> neg coronavirus testing 02/04/2019>>neg resp 3/21 >> Klebsiella oxytoca, co-ag negative staph  Antimicrobials:  02/04/2019 vancomycin>>3/ 10  02/04/2019 Zosyn>> 3/10 Pen G 3/10 >> 3/22 (plan) ceftx 3/20 >>3/24 Cipro 3/24>> 3/31  Interim history/subjective:  Re-intubated yesterday afternoon for pulmonary edema  Afebrile overnight Remains comfortable on low dose fentanyl/ precedex gtt Pulled NGT out IV team unable to place PICC, recs for IR consult NPO overnight for possible trach today,  with hypoglycemia episode   Objective   Blood pressure (!) 106/51, pulse 64, temperature 98 F (36.7 C), temperature source Oral, resp. rate 20, height 5\' 6"  (1.676 m), weight 76.9 kg, SpO2 100 %. CVP:  [7 mmHg-12 mmHg] 12 mmHg  Vent Mode: PRVC FiO2 (%):  [40 %-100 %] 50 % Set Rate:  [10 bmp-12 bmp] 12 bmp Vt Set:  [450 mL] 450 mL PEEP:  [5 cmH20-10 cmH20] 10 cmH20 Plateau Pressure:  [27 cmH20-31 cmH20] 28 cmH20   Intake/Output Summary (Last 24 hours) at 02/25/2019 0946 Last data filed at 02/25/2019 0900 Gross per 24 hour  Intake 1157.44 ml  Output 1745 ml  Net -587.56 ml   Filed Weights   02/23/19 0240 02/24/19 0446 02/25/19 0500  Weight: 80.4 kg 79.3 kg 76.9 kg   Examination: General:  Older chronically ill female sitting upright in bed in NAD HEENT: MM pink/moist, ETT/ OGT, pupils 3/reactive, no JVD  Neuro: Awake, some language barrier, MAE CV: RR no murmur PULM: even/non-labored, lungs bilaterally clear  IH:WTUU, non-tender, bsx4 active  Extremities: cool/dry, no edema  Skin: no rashes  Resolved Hospital Problem list   Hyperkalemia  LLQ abdominal pain-KUB 3/25 neg   Assessment & Plan:   Septic shock/group A strep bacteremia HCAP G neg, Sputum Culture 3/21>> + for Klebsiella Oxytoca and rare staph  Lugdenensis - Viral testing - negativ-Off pressors, changed from penicillin to ceftriaxonee for COVID, positive for rhino  - Cipro for H CAP- ends 3/31 for eight days  - consult  for IR for PICC line placement to remove subclavian line   Acute respiratory failure requiring mechanical ventilation -Pulmonary edema continues  to be significant factor post extubation despite BiPAP -pleural effusions - CXR 3/31- stable lines, improved pulm edema, patchy stable opacities, ongoing effusions P Full MV support with daily SBT- hold on extubation Scheduling for tracheostomy VAP measures  Consult to cortrak team   Acute Kidney Injury-improving  -etiology likely multifactorial-- Sepsis, ARB,  diuresis Hyperphosphatemia Hypermagnesemia-resolved  Hypokalemia in setting of diuresis   - -400/ net -3.9L P Lasix 40 mg BID  Resume free water  Follow BMP, urine output Replace electrolytes as indicated  Ischemic Cardiomyopathy/ LVEDP  - with no targets for revascularization on cath C0-ox 60% on 3/24 Echo 3/26 EF 35-40%, LV diffuse hypokinesis  P Cards following  Continue aspirin, Plavix Continue Bidil and Coreg  Cont Lasix    Atrial Fibrillation with RVR Short run 3/24>> Resolved with suction P Remains on amiodarone, carvedilol Continue telemetry   Diabetes type 2/uncontrolled hyperglycemia Now hypoglycemic in the setting of NPO -Was on high dose of 70/30 at home P Continue D10 at 10 m/hr still TF restarted SSI resistant  Hold am levemir, reduce to 35 units BID   Abdominal distention/ Ileus -improved P Minimize narcotics Bowel regimen as ordered  Acute encephalopathy-intermittent severe agitation, worsened by language barrier P -RASS goal 0 to -1, precedex / fentanyl as needed Family video conference in room seems to help   Acute Anemia -anemia of critical illness,no evidence of blood loss - s/p 1 PRBC 3/21, 1 PRBC 3/22 P Following CBC, stable  Goal Hgb > 8   GI ppx: Protonix VAP: Continue Pain/Sedation: fent / precedex  DVT ppx: Heparin SQ -resume if not proceeding with trach  Diet: Enteral Nutrition Code Status: full Family Communication: updated yesterday    CCT 30 mins  [name], MSN, AGACNP-BC [facility] Pgr: [phone] or if no answer [phone]  02/25/2019, 10:04 AM

## 2019-02-25 NOTE — Progress Notes (Signed)
PT Cancellation Note  Patient Details Name: Gabrielle Wright MRN: [id number] DOB: 1946/01/26   Cancelled Treatment:    Reason Eval/Treat Not Completed: Patient not medically ready(reintubated overnight, plan for trach)   Fabio Asa 02/25/2019, 7:30 AM Charlotte Crumb, PT DPT  Board Certified Neurologic Specialist Acute Rehabilitation Services Pager [phone] Office [phone]

## 2019-02-25 NOTE — Progress Notes (Addendum)
Nutrition Follow-up  DOCUMENTATION CODES:   Not applicable  INTERVENTION:   Resume tube feeding once pt has access:  -Vital AF 1.2 @ 45 ml/hr (1080 ml) via NGT -30 ml Prostat BID  Provides: 1496 kcals, 111 grams protein, 876 ml free water. Meets 105% of calorie needs and 100% of protein needs.   NUTRITION DIAGNOSIS:   Inadequate oral intake related to inability to eat as evidenced by NPO status.  Ongoing  GOAL:   Patient will meet greater than or equal to 90% of their needs  Not meeting- addressed via TF  MONITOR:   Vent status, TF tolerance, Labs, I & O's  REASON FOR ASSESSMENT:   Consult Enteral/tube feeding initiation and management  ASSESSMENT:   73 yo female with PMH of DM-2, HTN, CAD from [place] who was admitted with fever and respiratory failure requiring intubation. Admission complicated by NSTEMI and new LV dysfunction.   3/18- failed extubation  3/30- extubated  3/31- DNR rescinded, re-intubated   RD working remotely.  Spoke with RN. Plan for trach this afternoon. Cortrak ordered for today but unable to be placed as service is now offered on Mondays and Fridays only. Plan for nurse to place NGT at bedside after trach placement until PEG can be scheduled. RD to re-order TF.   Weight noted to decrease from 82.8 kg on 3/27 to 76.9 kg today. Pt continues to diurese. Will continue to monitor trends.   Patient is currently intubated on ventilator support MV: 9.0 L/min Temp (24hrs), Avg:97.8 F (36.6 C), Min:97.6 F (36.4 C), Max:98 F (36.7 C)  I/O: -4,063 ml since admit UOP: 1945 ml x 24 hrs   Medications reviewed and include: 40 mg lasix BID, SS novolog, levemir, liquid MVI, miralax, precedex, D10 @ 10 ml/hr Labs reviewed: Na 149 (H) Phosphorus 5.1 (H) Mg 2.8 (H) CBG 61-132  Diet Order:   Diet Order            Diet NPO time specified  Diet effective midnight              EDUCATION NEEDS:   No education needs have been identified at  this time  Skin:  Skin Assessment: Reviewed RN Assessment  Last BM:  3/30- rectal tube-unmeasured   Height:   Ht Readings from Last 1 Encounters:  02/04/19 5\' 6"  (1.676 m)    Weight:   Wt Readings from Last 1 Encounters:  02/25/19 76.9 kg    Ideal Body Weight:  59.1 kg  BMI:  Body mass index is 27.36 kg/m.  Estimated Nutritional Needs:   Kcal:  1423 kcal  Protein:  110-125 grams  Fluid:  >/= 1.4 L/day   Vanessa Kick RD, LDN Clinical Nutrition Pager # - [phone]

## 2019-02-25 NOTE — Progress Notes (Signed)
OT Cancellation Note  Patient Details Name: Bilinda Cubberley MRN: [id number] DOB: 1946/04/07   Cancelled Treatment:    Reason Eval/Treat Not Completed: Patient not medically ready. Re-intubated overnight, plan for trach.    Chancy Milroy, OT Acute Rehabilitation Services Pager [phone] Office [phone]    Chancy Milroy 02/25/2019, 11:51 AM

## 2019-02-25 NOTE — Progress Notes (Signed)
Progress Note  Patient Name: Gabrielle Wright Date of Encounter: 02/25/2019  Primary Cardiologist: Minus Breeding, MD   Subjective   Reintubated  Inpatient Medications    Scheduled Meds: . amiodarone  400 mg Per Tube BID  . aspirin  81 mg Oral Daily  . carvedilol  3.125 mg Per NG tube BID WC  . chlorhexidine gluconate (MEDLINE KIT)  15 mL Mouth Rinse BID  . Chlorhexidine Gluconate Cloth  6 each Topical Q0600  . clopidogrel  75 mg Oral Daily  . fentaNYL (SUBLIMAZE) injection  50 mcg Intravenous Once  . furosemide  40 mg Intravenous BID  . insulin aspart  0-24 Units Subcutaneous Q4H  . insulin detemir  65 Units Subcutaneous BID  . isosorbide-hydrALAZINE  1 tablet Oral TID  . mouth rinse  15 mL Mouth Rinse 10 times per day  . multivitamin  15 mL Per Tube Daily  . pantoprazole sodium  40 mg Per Tube QHS  . polyethylene glycol  17 g Oral Daily  . rocuronium  50 mg Intravenous Once  . sodium chloride flush  10-40 mL Intracatheter Q12H   Continuous Infusions: . sodium chloride Stopped (02/24/19 1616)  . ciprofloxacin Stopped (02/25/19 0047)  . dexmedetomidine (PRECEDEX) IV infusion 0.3 mcg/kg/hr (02/25/19 0700)  . fentaNYL infusion INTRAVENOUS 20 mcg/hr (02/25/19 0700)   PRN Meds: acetaminophen (TYLENOL) oral liquid 160 mg/5 mL, acetaminophen, bisacodyl, dextrose, diphenhydrAMINE, fentaNYL, hydrALAZINE, ipratropium-albuterol, ondansetron (ZOFRAN) IV, sennosides, sodium chloride flush   Vital Signs    Vitals:   02/25/19 0600 02/25/19 0630 02/25/19 0700 02/25/19 0746  BP: 107/60 (!) 100/55 (!) 86/53 (!) 88/52  Pulse: 64 (!) 57 (!) 56 (!) 57  Resp: (!) 26 12 14 12   Temp:      TempSrc:      SpO2: 100% 100% 100% 100%  Weight:      Height:        Intake/Output Summary (Last 24 hours) at 02/25/2019 0758 Last data filed at 02/25/2019 0700 Gross per 24 hour  Intake 1554.36 ml  Output 1945 ml  Net -390.64 ml   Last 3 Weights 02/25/2019 02/24/2019 02/23/2019  Weight (lbs) 169  lb 8.5 oz 174 lb 13.2 oz 177 lb 4 oz  Weight (kg) 76.9 kg 79.3 kg 80.4 kg      Telemetry    NSR- Personally Reviewed  Physical Exam   GEN: intubated; eyes open; moves ext Neck: supple Cardiac:  RRR, no murmur Respiratory: CTA anteriorly; no wheeze GI: Soft,ND, no masses palpated MS: no edema Neuro:  Moves ext; opens eyes; unchanged compared to yesterday   Labs    Chemistry Recent Labs  Lab 02/23/19 0355 02/24/19 0425 02/25/19 0446  NA 148* 148* 149*  K 3.7 3.6 4.5  CL 103 104 104  CO2 35* 33* 32  GLUCOSE 144* 129* 107*  BUN 75* 75* 86*  CREATININE 1.46* 1.39* 1.56*  CALCIUM 9.5 9.0 9.0  GFRNONAA 36* 38* 33*  GFRAA 41* 44* 38*  ANIONGAP 10 11 13      Hematology Recent Labs  Lab 02/23/19 0355 02/24/19 0425 02/25/19 0446  WBC 13.1* 12.6* 18.7*  RBC 3.92 3.87 4.14  HGB 9.6* 9.6* 10.4*  HCT 34.7* 34.3* 36.0  MCV 88.5 88.6 87.0  MCH 24.5* 24.8* 25.1*  MCHC 27.7* 28.0* 28.9*  RDW 20.2* 20.0* 19.9*  PLT 477* 465* 447*     BNP Recent Labs  Lab 02/20/19 0905 02/21/19 0403  BNP 1,046.8* 935.7*    Radiology  Dg Chest Port 1 View  Result Date: 02/25/2019 CLINICAL DATA:  Respiratory failure EXAM: PORTABLE CHEST 1 VIEW COMPARISON:  02/24/2019 FINDINGS: Endotracheal tube and left subclavian central line are again seen and stable. Gastric catheter has been removed. Cardiac shadow is stable. Aortic calcifications are again noted. Persistent central vascular congestion and bilateral airspace opacities are seen. Bilateral effusions are noted as well. No new focal abnormality is noted. IMPRESSION: Stable appearance of central vascular congestion with patchy bilateral opacities and effusions. Electronically Signed   By: Inez Catalina M.D.   On: 02/25/2019 07:34   Dg Chest Port 1 View  Result Date: 02/24/2019 CLINICAL DATA:  Intubation EXAM: PORTABLE CHEST 1 VIEW COMPARISON:  02/24/2019 FINDINGS: Endotracheal tube tip is 2 cm above the inferior margin of the carina.  Left subclavian approach central venous catheter tip projects over the right atrium. There is left basilar atelectasis in bilateral diffuse airspace opacity. IMPRESSION: 1. Endotracheal tube tip 2 cm above the inferior margin of the carina. Retraction of 3 cm would place it at the level of the clavicular heads. 2. Unchanged diffuse airspace disease. Electronically Signed   By: Ulyses Jarred M.D.   On: 02/24/2019 19:38   Dg Chest Port 1 View  Result Date: 02/24/2019 CLINICAL DATA:  Intubated patient admitted with fever, hypotension and bacteremia. EXAM: PORTABLE CHEST 1 VIEW COMPARISON:  Single-view of the chest 02/23/2019, 02/22/2019 and 02/21/2019. FINDINGS: Support tubes and lines are unchanged. Left worse than right airspace disease and small to moderate bilateral pleural effusions persist but aeration has improved. There is cardiomegaly. Aortic atherosclerosis is noted. IMPRESSION: Persistent but improved left greater than right airspace disease and small to moderate bilateral effusions. Electronically Signed   By: Inge Rise M.D.   On: 02/24/2019 07:55   Dg Abd Portable 1v  Result Date: 02/24/2019 CLINICAL DATA:  73 year old female status post enteric tube placement. EXAM: PORTABLE ABDOMEN - 1 VIEW COMPARISON:  Abdominal radiograph dated 02/22/2019 FINDINGS: Partially visualized enteric tube with tip and side-port in the left hemiabdomen likely in the body of the stomach. No definite bowel dilatation. Partially visualized central venous line with tip over the right atrium. Bilateral airspace opacities and probable small bilateral pleural effusions. There is atherosclerotic calcification of the aorta as well as coronary vascular calcifications. There is atherosclerotic calcification of the splenic artery. The osseous structures and soft tissues appear unremarkable. IMPRESSION: Enteric tube with tip and side-port in the left hemiabdomen likely in the body of the stomach. Electronically Signed   By:  Anner Crete M.D.   On: 02/24/2019 22:14   [place] Ekg Site Rite  Result Date: 02/24/2019 If Site Rite image not attached, placement could not be confirmed due to current cardiac rhythm.   Cardiac Studies   Echocardiogram March 2020-  1. The left ventricle has moderately reduced systolic function, with an ejection fraction of 35-40%. The cavity size was normal. Left ventricular diastolic Doppler parameters are consistent with pseudonormalization. Elevated left ventricular end-diastolic pressure The E/e' is 33. Regional wall motion abnormalities (see coded diagram below).  2. The right ventricle has mildly reduced systolic function. The cavity was normal. There is no increase in right ventricular wall thickness.  3. No hemodynamically significant valvular heart disease.  4. Normal biatrial chamber size.  5. IVC not well visualized.  Cardiac catheterization March 2020-  2nd Mrg-3 lesion is 80% stenosed.  Acute Mrg lesion is 90% stenosed.    Less than 25% left main  Proximal diffuse 30 to 40%  narrowing within the previously placed LAD stent.  The entire apical LAD is diffusely diseased with up to 85% stenosis.  The first diagonal contains 85% ostial narrowing.  Circumflex contains diffuse proximal to mid eccentric 50% stenosis the dominant second obtuse marginal contains mid vessel 50% stenosis in the mid to distal portion of this obtuse marginal there is a patent stent with 20% narrowing, and just distal to the stent there is an 80% thrombotic stenosis.  The RCA is dominant containing 50% mid vessel stenosis, 70% distal stenosis, 70% ostial PDA stenosis, and total occlusion of the mid vessel that fills by collaterals from the left ventricular apex.  Left ventriculography is not performed.  Today's echo was reviewed and there is anteroapical severe hypokinesis and estimated ejection fraction of 40%.  LVEDP by Cath Lab hemodynamics is 40 mmHg.  RECOMMENDATIONS:   Acute on chronic  combined systolic and diastolic heart failure with extremely elevated LVEDP greater than or equal to 40 mmHg related to global ischemia in the setting of norepinephrine therapy.  Recommend clopidogrel therapy if possible given stents in multiple territories and evidence of active thrombus beyond the stent in the obtuse marginal.  Current anatomy does not suggest that a role for coronary intervention.  35 cc of contrast used.  Given diabetes/shock state/intrinsic kidney disease, a bump in creatinine is still highly likely.  Kidney function needs to be monitored.  Patient Profile     73 y.o. female visiting from [place] admitted March 10 with fever, hypotension and found to have group A strep bacteremia requiring intubation.  Also ruled in for non-ST elevation myocardial infarction. Had cath and medical therapy recommended. Blood cultures March 10 showed group A strep.  Respiratory virus panel showed rhinovirus.  Influenza A and B negative.  Coronavirus negative.  Note LV dysfunction possibly secondary to stress cardiomyopathy.  Assessment & Plan    1 paroxysmal atrial fibrillation-Continue amiodarone at present dose and decrease to 200 mg daily in approximately 2 days.  As outlined previously my hope is that her atrial fibrillation is related to acute illness and long-term amiodarone will not be required.  Continue subcutaneous heparin.    2 NSTEMI/coronary artery disease-we are treating medically.  Continue aspirin and Plavix.  Add statin once extubated.  3 cardiomyopathy-question ischemic versus stress-induced cardiomyopathy.  Not a candidate for ARB or Entresto given severity of renal insufficiency; will hold bidil as BP borderline.  Continue low-dose carvedilol.    4 acute combined systolic/diastolic congestive heart failure-I/O - 390. Continue Lasix 40 mg IV twice daily.  She appears to be euvolemic this morning.  5 pneumonia-management per critical care medicine.  6 ventilator  dependent respiratory failure-failed extubation yesterday; will need trach; per CCM  7 acute on chronc stage 3 kidney disease-Need to keep dry if possible; will continue present dose of lasix and follow.  For questions or updates, please contact [place] Please consult [email or web address] for contact info under        Signed, Kirk Ruths, MD  02/25/2019, 7:58 AM

## 2019-02-25 NOTE — Procedures (Signed)
Bronchoscopy Procedure Note Torrie Ulch [id number] September 09, 1946  Procedure: Bronchoscopic assistance for percutaneous tracheostomy. Indications: Acute hypoxic respiratory failure with failure to wean from ventilator.  Procedure Details Consent: Risks of procedure as well as the alternatives and risks of each were explained to the (patient/caregiver).  Consent for procedure obtained. Time Out: Verified patient identification, verified procedure, site/side was marked, verified correct patient position, special equipment/implants available, medications/allergies/relevent history reviewed, required imaging and test results available.  Performed  In preparation for procedure, patient was given 100% FiO2.  Performed in ICU.    Bronchoscope passed through endotracheal tube and advanced to carina.  Both right and left sided airways inspected.  No endobronchial lesions noted.  Endotracheal tube withdrawn under direct to 18 cm at lip under direct visualization.   Observed finder needle, guidewire, dilators, and #6 tracheostomy entering airway w/o evidence for bleeding or posterior membrane injury.  Transferred bronchoscope from ETT to tracheostomy tube.  This was observed in good position above the carina.  No significant bleeding.  Patient connected to ventilator.  Evaluation Hemodynamic Status: BP stable throughout; O2 sats: stable throughout Patient's Current Condition: stable Complications: No apparent complications Patient did tolerate procedure well.   Coralyn Helling, MD [facility]monary/Critical Care 02/25/2019, 3:56 PM

## 2019-02-26 ENCOUNTER — Inpatient Hospital Stay (HOSPITAL_COMMUNITY): Payer: Medicaid Other

## 2019-02-26 LAB — GLUCOSE, CAPILLARY
Glucose-Capillary: 157 mg/dL — ABNORMAL HIGH (ref 70–99)
Glucose-Capillary: 174 mg/dL — ABNORMAL HIGH (ref 70–99)
Glucose-Capillary: 175 mg/dL — ABNORMAL HIGH (ref 70–99)
Glucose-Capillary: 204 mg/dL — ABNORMAL HIGH (ref 70–99)
Glucose-Capillary: 216 mg/dL — ABNORMAL HIGH (ref 70–99)

## 2019-02-26 LAB — RENAL FUNCTION PANEL
Albumin: 2.3 g/dL — ABNORMAL LOW (ref 3.5–5.0)
Anion gap: 10 (ref 5–15)
BUN: 91 mg/dL — ABNORMAL HIGH (ref 8–23)
CO2: 33 mmol/L — ABNORMAL HIGH (ref 22–32)
Calcium: 8.6 mg/dL — ABNORMAL LOW (ref 8.9–10.3)
Chloride: 104 mmol/L (ref 98–111)
Creatinine, Ser: 1.73 mg/dL — ABNORMAL HIGH (ref 0.44–1.00)
GFR calc Af Amer: 34 mL/min — ABNORMAL LOW (ref >60)
GFR, EST NON AFRICAN AMERICAN: 29 mL/min — AB (ref >60)
Glucose, Bld: 194 mg/dL — ABNORMAL HIGH (ref 70–99)
Phosphorus: 5.2 mg/dL — ABNORMAL HIGH (ref 2.5–4.6)
Potassium: 3.5 mmol/L (ref 3.5–5.1)
SODIUM: 147 mmol/L — AB (ref 135–145)

## 2019-02-26 LAB — COOXEMETRY PANEL
Carboxyhemoglobin: 1.6 % — ABNORMAL HIGH (ref 0.5–1.5)
Methemoglobin: 1.2 % (ref 0.0–1.5)
O2 Saturation: 66.9 %
Total hemoglobin: 10.5 g/dL — ABNORMAL LOW (ref 12.0–16.0)

## 2019-02-26 LAB — MAGNESIUM: Magnesium: 2.9 mg/dL — ABNORMAL HIGH (ref 1.7–2.4)

## 2019-02-26 MED ORDER — CLONAZEPAM 0.1 MG/ML ORAL SUSPENSION: 0.5000 mg | Freq: Two times a day (BID) | ORAL | Status: DC

## 2019-02-26 MED ORDER — HYDRALAZINE HCL 10 MG PO TABS
10.0000 mg | ORAL_TABLET | Freq: Three times a day (TID) | ORAL | Status: DC
  Administered 2019-02-26 – 2019-03-04 (×18): 10 mg via ORAL
  Filled 2019-02-26 (×18): qty 1

## 2019-02-26 MED ORDER — HEPARIN SODIUM (PORCINE) 5000 UNIT/ML IJ SOLN
5000.0000 [IU] | Freq: Three times a day (TID) | INTRAMUSCULAR | Status: DC
  Administered 2019-02-26 – 2019-03-05 (×22): 5000 [IU] via SUBCUTANEOUS
  Filled 2019-02-26 (×21): qty 1

## 2019-02-26 MED ORDER — ISOSORBIDE MONONITRATE ER 30 MG PO TB24: 30.0000 mg | ORAL_TABLET | Freq: Every day | ORAL | Status: DC

## 2019-02-26 MED ORDER — SIMETHICONE 80 MG PO CHEW
40.0000 mg | CHEWABLE_TABLET | Freq: Four times a day (QID) | ORAL | Status: DC | PRN
  Administered 2019-02-26 – 2019-03-12 (×4): 40 mg
  Filled 2019-02-26 (×4): qty 1

## 2019-02-26 MED ORDER — LIDOCAINE HCL (PF) 1 % IJ SOLN
INTRAMUSCULAR | Status: DC | PRN
  Administered 2019-02-26: 5 mL

## 2019-02-26 MED ORDER — "THROMBI-PAD 3""X3"" EX PADS"
1.0000 | MEDICATED_PAD | Freq: Once | CUTANEOUS | Status: AC
  Administered 2019-02-27: 1 via TOPICAL
  Filled 2019-02-26: qty 1

## 2019-02-26 MED ORDER — SORBITOL 70 % SOLN
30.0000 mL | Freq: Once | Status: AC
  Administered 2019-02-26: 30 mL
  Filled 2019-02-26: qty 30

## 2019-02-26 MED ORDER — CLONAZEPAM 0.5 MG PO TBDP
0.5000 mg | ORAL_TABLET | Freq: Two times a day (BID) | ORAL | Status: DC
  Administered 2019-02-26 – 2019-03-01 (×8): 0.5 mg
  Filled 2019-02-26 (×3): qty 1
  Filled 2019-02-26: qty 2
  Filled 2019-02-26 (×2): qty 1
  Filled 2019-02-26: qty 2
  Filled 2019-02-26: qty 1

## 2019-02-26 MED ORDER — ISOSORBIDE MONONITRATE ER 30 MG PO TB24
30.0000 mg | ORAL_TABLET | Freq: Every day | ORAL | Status: DC
  Administered 2019-02-26 – 2019-03-03 (×6): 30 mg via ORAL
  Filled 2019-02-26 (×6): qty 1

## 2019-02-26 MED ORDER — LIDOCAINE HCL 1 % IJ SOLN
INTRAMUSCULAR | Status: AC
  Filled 2019-02-26: qty 20

## 2019-02-26 MED ORDER — PRO-STAT SUGAR FREE PO LIQD
30.0000 mL | Freq: Two times a day (BID) | ORAL | Status: DC
  Administered 2019-02-26 – 2019-02-28 (×5): 30 mL
  Filled 2019-02-26 (×5): qty 30

## 2019-02-26 MED ORDER — AMIODARONE HCL 200 MG PO TABS
200.0000 mg | ORAL_TABLET | Freq: Every day | ORAL | Status: DC
  Administered 2019-02-26 – 2019-03-21 (×24): 200 mg
  Filled 2019-02-26 (×24): qty 1

## 2019-02-26 NOTE — Progress Notes (Signed)
NAME:  Gabrielle Wright, MRN:  [id number], DOB:  08/14/1946, LOS: 22 ADMISSION DATE:  02/03/2019, CONSULTATION DATE: 02/04/2019 REFERRING MD: Emergency department physician cHIEF COMPLAINT: Fever respiratory failure  Brief History   73 year old elderly woman visiting from [place], admitted 3/10 with fevers, hypotension, found to have group A strep bacteremia, required mechanical ventilation Course complicated by non-STEMI and new LV dysfunction ? Ischemic vs Takatsubo's Failed extubation 3/18 due to acute pulmonary edema  Past Medical History  Hypertension Coronary artery disease Suspected diabetes review of her arterial consult receiving correct.  Significant Hospital IsEvents   02/04/2019 transfer from [facility] to [facility] required intubation and pressors. 3/16 off Levophed 3/18 extubated briefly but reintubated due to pulmonary edema 3/19 started milrinone 3/20 Episode of respiratory distress,  Precedex changed to propofol Atrial fibrillation RVR >>amiodarone bolus >> sinus rhythm 3/21 rising creat , AF-RVR >> amio bolus 3/24:rising creatinine 3/30: reintubated 3/31 trach  Consults:  02/04/2019 ID  IR   Procedures:  02/04/2019 intubation>> 3/18, 3/18 >> 02/04/2019 CVL>>  Significant Diagnostic Tests:  Echo 3/15 decreased EF 35 to 40%, LVEDP 27, no significant MR Cardiac cath >> LVEDP 40, RCA totally occluded, patent OM1 stent but distally occluded, apical LAD diffusely diseased, no intervention Chest x-ray 3/22  bilateral lower lobe airspace disease versus layering effusions CXR 3/23> pulmonary edema and bilateral pleural effusions. CXR 4/1> bibasilar opacities, smll R pleural effusion, tracheostomy tube present and appropriately positioned  Micro Data:  02/04/2019 blood cultures x2>> Gr A strep 02/04/2019 sputum culture>> neg 02/04/2019 urine culture>>neg 02/04/2019 respiratory virus panel>> rhinovirus 02/04/2019 flu a and B>> neg coronavirus testing  02/04/2019>>neg resp 3/21 >> Klebsiella oxytoca, co-ag negative staph  Antimicrobials:  02/04/2019 vancomycin>>3/ 10 02/04/2019 Zosyn>> 3/10 Pen G 3/10 >> 3/22 (plan) ceftx 3/20 >>3/24 Cipro 3/24>> 3/31  Interim history/subjective:  Weaning vent requirement this morning Complaints of gas pain IR to place PICC today    Objective   Blood pressure 139/60, pulse 73, temperature (!) 97.4 F (36.3 C), temperature source Oral, resp. rate 17, height 5\' 6"  (1.676 m), weight 76 kg, SpO2 100 %. CVP:  [9 mmHg-15 mmHg] 15 mmHg  Vent Mode: PSV;CPAP FiO2 (%):  [40 %-50 %] 40 % Set Rate:  [12 bmp] 12 bmp Vt Set:  [450 mL] 450 mL PEEP:  [8 cmH20-10 cmH20] 8 cmH20 Pressure Support:  [5 cmH20] 5 cmH20 Plateau Pressure:  [20 cmH20-29 cmH20] 21 cmH20   Intake/Output Summary (Last 24 hours) at 02/26/2019 1020 Last data filed at 02/26/2019 0915 Gross per 24 hour  Intake 1269.61 ml  Output 1610 ml  Net -340.39 ml   Filed Weights   02/24/19 0446 02/25/19 0500 02/26/19 0459  Weight: 79.3 kg 76.9 kg 76 kg   Examination: General:  Chronically ill appearing older adult female, trach/vent, laying in bed, mild distress related to pain  HEENT: NCAT. Pink mmm, Trach secure, NGT secure.  Neuro: Awake, drowsy, following commands. PERRL. Moves BUE BLE spontaneously  CV: RR s1s2 no r/g/m  PULM: Symmetrical chest expansion, unlabored respiration with no accessory muscle recruitment. Bibasilar crackles.  GI: Soft, mildly distended, "gassy" tenderness LLQ, no rebound tenderness. Normoactive bowel sounds x 4 Extremities: Symmetrical bulk and tone, no obvious deformity, no pitting edema  Skin: Clean, dry, warm, intact without rash or abrasion   Resolved Hospital Problem list   Hyperkalemia  LLQ abdominal pain-KUB 3/25 neg   Assessment & Plan:   Septic shock/group A strep bacteremia HCAP G neg, Sputum Culture  3/21>> + for Klebsiella Oxytoca and rare staph  Lugdenensis - Viral testing - negativ-Off pressors,  changed from penicillin to ceftriaxonee for COVID, positive for rhino  - Cipro for H CAP- ends 3/31 for eight days  - IR to place PICC 4/1 -remove [place] CVC after PICC placement   Acute respiratory failure requiring mechanical ventilation -Pulmonary edema continues to be significant factor post extubation despite BiPAP  -pleural effusions - CXR 3/31- stable lines, improved pulm edema, patchy stable opacities, ongoing effusions P S/p tracheostomy Wean vent as able Continue pulm hygiene  AM CXR   Acute Kidney Injury -etiology likely multifactorial-- Sepsis, ARB,  Aggressive diuresis  Hyperphosphatemia Hypermagnesemia-resolved  Hypokalemia in setting of diuresis   - -400/ net -3.9L P Continue Lasix( currently 40 BID)  FWF q6  Follow BMP Follow I/O Replace electrolytes as indicated If worsening hyperphos, consider adding sevelamir   Ischemic Cardiomyopathy/ LVEDP  - with no targets for revascularization on cath C0-ox 60% on 3/24 Echo 3/26 EF 35-40%, LV diffuse hypokinesis  P Cards following  Continue aspirin, Plavix Continue Bidil and Coreg  Cont Lasix    Atrial Fibrillation with RVR Short run 3/24>> Resolved with suction P Remains on amiodarone, carvedilol Continue telemetry   Diabetes type 2/uncontrolled hyperglycemia Now hypoglycemic in the setting of NPO -Was on high dose of 70/30 at home P Continue D10 at 10 m/hr still TF restarted SSI resistant  Hold am levemir, reduce to 35 units BID  Abdominal distention/ Ileus -improved  -LLQ pain 4/1 P Minimize narcotics, 4/1 will dc the fentanyl gtt and use PRN fent, with continuation of precedex gtt  Bowel regimen as ordered 4/1 adding simethicone  Consider  KUB if no improvement   Acute encephalopathy-intermittent severe agitation, worsened by language barrier P -RASS goal 0. Continue precedex gtt Dc fentanyl gtt PRN fent  Family video conference in room seems to help   Acute Anemia -anemia of critical  illness,no evidence of blood loss - s/p 1 PRBC 3/21, 1 PRBC 3/22 P Following CBC, stable  Goal Hgb > 8 -Holding heparin until after PICC placed. SCDs in meantime    GI ppx: Protonix VAP: Continue Pain/Sedation: fent / precedex  DVT ppx: SCDs, Add SQ Heparin after PICC placement  Diet: Enteral Nutrition Code Status: full Family Communication: none   CCT 30 minutes   Tessie Fass MSN, AGACNP-BC [facility] [phone] If no answer, [phone] 02/26/2019, 10:39 AM

## 2019-02-26 NOTE — Procedures (Signed)
Pre procedural Diagnosis: Poor venous access Post Procedural Diagnosis: Same  Successful placement of right basilic vein approach 40 cm dual lumen PICC line with tip at the superior caval-atrial junction.    EBL: None  No immediate post procedural complication.  The PICC line is ready for immediate use.  Katherina Right, MD Pager #: [phone]

## 2019-02-26 NOTE — Progress Notes (Signed)
Attempted patient on ATC of 40%. Patient desat to 79% with accessory muscle use. ATC increased ATC to 80% and pt came up to 91% with accessory muscle use. Patient was place back on the vent with full support. Patient WOB returned to normal and SPO2 came up to 97%.

## 2019-02-26 NOTE — Progress Notes (Signed)
Spoke with Delight Stare NP and Dr. Vassie Loll regarding pt PICC line bleeding. Noted pressure dressing applied. Ordered to continue to watch and if it become unmanageable speak with IR or relay status back to night CCM.

## 2019-02-26 NOTE — Progress Notes (Addendum)
Progress Note  Patient Name: Gabrielle Wright Date of Encounter: 02/26/2019  Primary Cardiologist: Minus Breeding, MD   Subjective   Opens eyes  Inpatient Medications    Scheduled Meds: . amiodarone  400 mg Per Tube BID  . aspirin  81 mg Oral Daily  . carvedilol  3.125 mg Per NG tube BID WC  . chlorhexidine gluconate (MEDLINE KIT)  15 mL Mouth Rinse BID  . Chlorhexidine Gluconate Cloth  6 each Topical Q0600  . clopidogrel  75 mg Oral Daily  . feeding supplement (VITAL AF 1.2 CAL)  1,000 mL Per Tube Q24H  . fentaNYL (SUBLIMAZE) injection  50 mcg Intravenous Once  . free water  250 mL Per Tube Q6H  . furosemide  40 mg Intravenous BID  . insulin aspart  0-24 Units Subcutaneous Q4H  . insulin detemir  32 Units Subcutaneous BID  . mouth rinse  15 mL Mouth Rinse BID  . multivitamin  15 mL Per Tube Daily  . pantoprazole sodium  40 mg Per Tube QHS  . polyethylene glycol  17 g Oral Daily  . rocuronium  50 mg Intravenous Once  . rocuronium  50 mg Intravenous Once  . sodium chloride flush  10-40 mL Intracatheter Q12H   Continuous Infusions: . sodium chloride Stopped (02/24/19 1616)  . dexmedetomidine (PRECEDEX) IV infusion 0.1 mcg/kg/hr (02/26/19 0600)  . fentaNYL infusion INTRAVENOUS 75 mcg/hr (02/26/19 0600)   PRN Meds: acetaminophen (TYLENOL) oral liquid 160 mg/5 mL, acetaminophen, bisacodyl, dextrose, diphenhydrAMINE, fentaNYL, hydrALAZINE, ipratropium-albuterol, ondansetron (ZOFRAN) IV, sennosides, sodium chloride flush   Vital Signs    Vitals:   02/26/19 0400 02/26/19 0459 02/26/19 0500 02/26/19 0600  BP:   120/63 126/60  Pulse: 71  64 71  Resp: 14  12 15   Temp: 98 F (36.7 C)     TempSrc: Oral     SpO2: 100%  100% 100%  Weight:  76 kg    Height:        Intake/Output Summary (Last 24 hours) at 02/26/2019 0729 Last data filed at 02/26/2019 0600 Gross per 24 hour  Intake 1177.85 ml  Output 1830 ml  Net -652.15 ml   Last 3 Weights 02/26/2019 02/25/2019 02/24/2019   Weight (lbs) 167 lb 8.8 oz 169 lb 8.5 oz 174 lb 13.2 oz  Weight (kg) 76 kg 76.9 kg 79.3 kg      Telemetry    NSR- Personally Reviewed  Physical Exam   GEN: Opens eyes; chronically ill appearing Neck: tracheostomy Cardiac:  RRR, no murmur Respiratory: CTA anteriorly GI: Soft, mildly distended MS: no edema Neuro: Opens eyes   Labs    Chemistry Recent Labs  Lab 02/23/19 0355 02/24/19 0425 02/25/19 0446  NA 148* 148* 149*  K 3.7 3.6 4.5  CL 103 104 104  CO2 35* 33* 32  GLUCOSE 144* 129* 107*  BUN 75* 75* 86*  CREATININE 1.46* 1.39* 1.56*  CALCIUM 9.5 9.0 9.0  GFRNONAA 36* 38* 33*  GFRAA 41* 44* 38*  ANIONGAP 10 11 13      Hematology Recent Labs  Lab 02/24/19 0425 02/25/19 0446 02/26/19 0500  WBC 12.6* 18.7* 11.2*  RBC 3.87 4.14 4.08  HGB 9.6* 10.4* 9.9*  HCT 34.3* 36.0 36.2  MCV 88.6 87.0 88.7  MCH 24.8* 25.1* 24.3*  MCHC 28.0* 28.9* 27.3*  RDW 20.0* 19.9* 19.9*  PLT 465* 447* 397     BNP Recent Labs  Lab 02/20/19 0905 02/21/19 0403  BNP 1,046.8* 935.7*    Radiology  Portable Chest X-ray  Result Date: 02/25/2019 CLINICAL DATA:  Tracheostomy tube, hypertension, diabetes mellitus EXAM: PORTABLE CHEST 1 VIEW COMPARISON:  Portable exam 1612 hours compared to 02/25/2019 FINDINGS: Olivia Mackie ostomy tube projects over tracheal air column. LEFT subclavian central venous catheter tip projects over RIGHT atrium. Nasogastric tube extends into stomach. Enlargement of cardiac silhouette post coronary stenting. Atherosclerotic calcification aorta. Bibasilar atelectasis versus consolidation. Upper lungs clear. Component of small RIGHT pleural effusion is present. No pneumothorax. IMPRESSION: Bibasilar atelectasis versus consolidation with small RIGHT pleural effusion. Electronically Signed   By: Lavonia Dana M.D.   On: 02/25/2019 16:27   Dg Chest Port 1 View  Result Date: 02/25/2019 CLINICAL DATA:  Respiratory failure EXAM: PORTABLE CHEST 1 VIEW COMPARISON:   02/24/2019 FINDINGS: Endotracheal tube and left subclavian central line are again seen and stable. Gastric catheter has been removed. Cardiac shadow is stable. Aortic calcifications are again noted. Persistent central vascular congestion and bilateral airspace opacities are seen. Bilateral effusions are noted as well. No new focal abnormality is noted. IMPRESSION: Stable appearance of central vascular congestion with patchy bilateral opacities and effusions. Electronically Signed   By: Inez Catalina M.D.   On: 02/25/2019 07:34   Dg Chest Port 1 View  Result Date: 02/24/2019 CLINICAL DATA:  Intubation EXAM: PORTABLE CHEST 1 VIEW COMPARISON:  02/24/2019 FINDINGS: Endotracheal tube tip is 2 cm above the inferior margin of the carina. Left subclavian approach central venous catheter tip projects over the right atrium. There is left basilar atelectasis in bilateral diffuse airspace opacity. IMPRESSION: 1. Endotracheal tube tip 2 cm above the inferior margin of the carina. Retraction of 3 cm would place it at the level of the clavicular heads. 2. Unchanged diffuse airspace disease. Electronically Signed   By: Ulyses Jarred M.D.   On: 02/24/2019 19:38   Dg Abd Portable 1v  Result Date: 02/24/2019 CLINICAL DATA:  73 year old female status post enteric tube placement. EXAM: PORTABLE ABDOMEN - 1 VIEW COMPARISON:  Abdominal radiograph dated 02/22/2019 FINDINGS: Partially visualized enteric tube with tip and side-port in the left hemiabdomen likely in the body of the stomach. No definite bowel dilatation. Partially visualized central venous line with tip over the right atrium. Bilateral airspace opacities and probable small bilateral pleural effusions. There is atherosclerotic calcification of the aorta as well as coronary vascular calcifications. There is atherosclerotic calcification of the splenic artery. The osseous structures and soft tissues appear unremarkable. IMPRESSION: Enteric tube with tip and side-port in  the left hemiabdomen likely in the body of the stomach. Electronically Signed   By: Anner Crete M.D.   On: 02/24/2019 22:14   [place] Ekg Site Rite  Result Date: 02/24/2019 If Site Rite image not attached, placement could not be confirmed due to current cardiac rhythm.   Cardiac Studies   Echocardiogram March 2020-  1. The left ventricle has moderately reduced systolic function, with an ejection fraction of 35-40%. The cavity size was normal. Left ventricular diastolic Doppler parameters are consistent with pseudonormalization. Elevated left ventricular end-diastolic pressure The E/e' is 90. Regional wall motion abnormalities (see coded diagram below).  2. The right ventricle has mildly reduced systolic function. The cavity was normal. There is no increase in right ventricular wall thickness.  3. No hemodynamically significant valvular heart disease.  4. Normal biatrial chamber size.  5. IVC not well visualized.  Cardiac catheterization March 2020-  2nd Mrg-3 lesion is 80% stenosed.  Acute Mrg lesion is 90% stenosed.    Less than 25% left  main  Proximal diffuse 30 to 40% narrowing within the previously placed LAD stent.  The entire apical LAD is diffusely diseased with up to 85% stenosis.  The first diagonal contains 85% ostial narrowing.  Circumflex contains diffuse proximal to mid eccentric 50% stenosis the dominant second obtuse marginal contains mid vessel 50% stenosis in the mid to distal portion of this obtuse marginal there is a patent stent with 20% narrowing, and just distal to the stent there is an 80% thrombotic stenosis.  The RCA is dominant containing 50% mid vessel stenosis, 70% distal stenosis, 70% ostial PDA stenosis, and total occlusion of the mid vessel that fills by collaterals from the left ventricular apex.  Left ventriculography is not performed.  Today's echo was reviewed and there is anteroapical severe hypokinesis and estimated ejection fraction of 40%.  LVEDP  by Cath Lab hemodynamics is 40 mmHg.  RECOMMENDATIONS:   Acute on chronic combined systolic and diastolic heart failure with extremely elevated LVEDP greater than or equal to 40 mmHg related to global ischemia in the setting of norepinephrine therapy.  Recommend clopidogrel therapy if possible given stents in multiple territories and evidence of active thrombus beyond the stent in the obtuse marginal.  Current anatomy does not suggest that a role for coronary intervention.  35 cc of contrast used.  Given diabetes/shock state/intrinsic kidney disease, a bump in creatinine is still highly likely.  Kidney function needs to be monitored.  Patient Profile     73 y.o. female visiting from [place] admitted March 10 with fever, hypotension and found to have group A strep bacteremia requiring intubation.  Also ruled in for non-ST elevation myocardial infarction. Had cath and medical therapy recommended. Blood cultures March 10 showed group A strep.  Respiratory virus panel showed rhinovirus.  Influenza A and B negative.  Coronavirus negative.  Note LV dysfunction possibly secondary to stress cardiomyopathy.  Assessment & Plan    1 paroxysmal atrial fibrillation-remains in sinus rhythm.  Continue amiodarone and Coreg; continue subcutaneous heparin.  Atrial fibrillation likely related to stress of pneumonia.  2 NSTEMI/coronary artery disease-medical therapy recommended; continue ASA and plavix; add statin later.  3 cardiomyopathy-likely ischemic.  Not a candidate for ARB or Entresto given severity of renal insufficiency; continue hydralazine/nitrates.  Increase carvedilol to 6.25 mg twice daily.  4 acute combined systolic/diastolic congestive heart failure-I/O - 1123. Coox 82; CVP 9. Continue Lasix 40 mg IV twice daily.  BUN and Cr improved this AM (? Lab error).  5 pneumonia-management per critical care medicine.  6 ventilator dependent respiratory failure-failed extubation; now s/p trach.   7 acute on chronc stage 3 kidney disease-improved this AM; follow  8 hypokalemia-supplement.  For questions or updates, please contact [place] Please consult [email or web address] for contact info under        Signed, Kirk Ruths, MD  02/26/2019, 7:29 AM

## 2019-02-26 NOTE — Progress Notes (Signed)
SLP Cancellation Note  Patient Details Name: Gabrielle Wright MRN: [id number] DOB: [birth year]   Cancelled treatment:       Reason Eval/Treat Not Completed: Patient not medically ready. Patient with new tracheostomy. Orders for SLP eval and treat for PMSV and swallowing received. Will follow pt closely for readiness for SLP interventions as appropriate.    Harlon Ditty, MA CCC-SLP  Acute Rehabilitation Services Pager [phone] Office [phone]  Claudine Mouton 02/26/2019, 9:18 AM

## 2019-02-26 NOTE — Progress Notes (Signed)
Patient transported to IR for PICC and back on vent. No complications noted.

## 2019-02-27 ENCOUNTER — Inpatient Hospital Stay (HOSPITAL_COMMUNITY): Payer: Medicaid Other

## 2019-02-27 DIAGNOSIS — Z930 Tracheostomy status: Secondary | ICD-10-CM

## 2019-02-27 LAB — COOXEMETRY PANEL
Carboxyhemoglobin: 2.1 % — ABNORMAL HIGH (ref 0.5–1.5)
Methemoglobin: 1.6 % — ABNORMAL HIGH (ref 0.0–1.5)
O2 Saturation: 82.4 %
Total hemoglobin: 9.8 g/dL — ABNORMAL LOW (ref 12.0–16.0)

## 2019-02-27 LAB — BASIC METABOLIC PANEL
Anion gap: 6 (ref 5–15)
Anion gap: 6 (ref 5–15)
BUN: 65 mg/dL — ABNORMAL HIGH (ref 8–23)
BUN: 83 mg/dL — ABNORMAL HIGH (ref 8–23)
CO2: 24 mmol/L (ref 22–32)
CO2: 34 mmol/L — ABNORMAL HIGH (ref 22–32)
Calcium: 6 mg/dL — CL (ref 8.9–10.3)
Calcium: 8.6 mg/dL — ABNORMAL LOW (ref 8.9–10.3)
Chloride: 121 mmol/L — ABNORMAL HIGH (ref 98–111)
Chloride: 106 mmol/L (ref 98–111)
Creatinine, Ser: 0.98 mg/dL (ref 0.44–1.00)
Creatinine, Ser: 1.3 mg/dL — ABNORMAL HIGH (ref 0.44–1.00)
GFR calc Af Amer: >60 mL/min (ref >60)
GFR calc Af Amer: 47 mL/min — ABNORMAL LOW (ref >60)
GFR calc non Af Amer: 58 mL/min — ABNORMAL LOW (ref >60)
GFR calc non Af Amer: 41 mL/min — ABNORMAL LOW (ref >60)
Glucose, Bld: 173 mg/dL — ABNORMAL HIGH (ref 70–99)
Glucose, Bld: 221 mg/dL — ABNORMAL HIGH (ref 70–99)
Potassium: 2.4 mmol/L — CL (ref 3.5–5.1)
Potassium: 4.4 mmol/L (ref 3.5–5.1)
Sodium: 151 mmol/L — ABNORMAL HIGH (ref 135–145)
Sodium: 146 mmol/L — ABNORMAL HIGH (ref 135–145)

## 2019-02-27 LAB — CBC
HCT: 36.2 % (ref 36.0–46.0)
HCT: 28.6 % — ABNORMAL LOW (ref 36.0–46.0)
Hemoglobin: 9.9 g/dL — ABNORMAL LOW (ref 12.0–15.0)
Hemoglobin: 7.9 g/dL — ABNORMAL LOW (ref 12.0–15.0)
MCH: 24.3 pg — ABNORMAL LOW (ref 26.0–34.0)
MCH: 24.9 pg — ABNORMAL LOW (ref 26.0–34.0)
MCHC: 27.3 g/dL — ABNORMAL LOW (ref 30.0–36.0)
MCHC: 27.6 g/dL — ABNORMAL LOW (ref 30.0–36.0)
MCV: 88.7 fL (ref 80.0–100.0)
MCV: 90.2 fL (ref 80.0–100.0)
NRBC: 0 % (ref 0.0–0.2)
Platelets: 397 10*3/uL (ref 150–400)
Platelets: 297 10*3/uL (ref 150–400)
RBC: 4.08 MIL/uL (ref 3.87–5.11)
RBC: 3.17 MIL/uL — ABNORMAL LOW (ref 3.87–5.11)
RDW: 19.9 % — ABNORMAL HIGH (ref 11.5–15.5)
RDW: 19.6 % — ABNORMAL HIGH (ref 11.5–15.5)
WBC: 11.2 10*3/uL — ABNORMAL HIGH (ref 4.0–10.5)
WBC: 9.7 10*3/uL (ref 4.0–10.5)
nRBC: 0 % (ref 0.0–0.2)

## 2019-02-27 LAB — MAGNESIUM: Magnesium: 2.2 mg/dL (ref 1.7–2.4)

## 2019-02-27 LAB — GLUCOSE, CAPILLARY
Glucose-Capillary: 178 mg/dL — ABNORMAL HIGH (ref 70–99)
Glucose-Capillary: 181 mg/dL — ABNORMAL HIGH (ref 70–99)
Glucose-Capillary: 189 mg/dL — ABNORMAL HIGH (ref 70–99)
Glucose-Capillary: 193 mg/dL — ABNORMAL HIGH (ref 70–99)
Glucose-Capillary: 201 mg/dL — ABNORMAL HIGH (ref 70–99)

## 2019-02-27 LAB — PHOSPHORUS: Phosphorus: 2.7 mg/dL (ref 2.5–4.6)

## 2019-02-27 MED ORDER — FENTANYL CITRATE (PF) 100 MCG/2ML IJ SOLN
25.0000 ug | INTRAMUSCULAR | Status: DC | PRN
  Administered 2019-02-27 (×2): 50 ug via INTRAVENOUS
  Administered 2019-02-27: 25 ug via INTRAVENOUS
  Administered 2019-02-28 – 2019-03-02 (×10): 50 ug via INTRAVENOUS
  Filled 2019-02-27 (×13): qty 2

## 2019-02-27 MED ORDER — LIDOCAINE HCL (PF) 1 % IJ SOLN
INTRAMUSCULAR | Status: AC
  Filled 2019-02-27: qty 30

## 2019-02-27 MED ORDER — POTASSIUM CHLORIDE 10 MEQ/50ML IV SOLN
10.0000 meq | INTRAVENOUS | Status: AC
  Administered 2019-02-27 (×5): 10 meq via INTRAVENOUS
  Filled 2019-02-27 (×4): qty 50

## 2019-02-27 MED ORDER — "THROMBI-PAD 3""X3"" EX PADS"
1.0000 | MEDICATED_PAD | Freq: Once | CUTANEOUS | Status: DC
  Filled 2019-02-27: qty 1

## 2019-02-27 MED ORDER — CARVEDILOL 6.25 MG PO TABS
6.2500 mg | ORAL_TABLET | Freq: Two times a day (BID) | ORAL | Status: DC
  Administered 2019-02-27 – 2019-03-07 (×17): 6.25 mg via NASOGASTRIC
  Filled 2019-02-27 (×17): qty 1

## 2019-02-27 MED ORDER — INSULIN DETEMIR 100 UNIT/ML ~~LOC~~ SOLN
50.0000 [IU] | Freq: Two times a day (BID) | SUBCUTANEOUS | Status: DC
  Administered 2019-02-27 – 2019-03-04 (×11): 50 [IU] via SUBCUTANEOUS
  Filled 2019-02-27 (×15): qty 0.5

## 2019-02-27 MED ORDER — CALCIUM GLUCONATE-NACL 2-0.675 GM/100ML-% IV SOLN
2.0000 g | Freq: Once | INTRAVENOUS | Status: DC
  Filled 2019-02-27: qty 100

## 2019-02-27 MED ORDER — LIDOCAINE HCL (PF) 1 % IJ SOLN
INTRAMUSCULAR | Status: AC
  Filled 2019-02-27: qty 5

## 2019-02-27 MED ORDER — CHLORHEXIDINE GLUCONATE 0.12 % MT SOLN
OROMUCOSAL | Status: AC
  Administered 2019-02-27: 15 mL via OROMUCOSAL
  Filled 2019-02-27: qty 15

## 2019-02-27 MED ORDER — FUROSEMIDE 10 MG/ML IJ SOLN
40.0000 mg | Freq: Two times a day (BID) | INTRAMUSCULAR | Status: DC
  Administered 2019-02-27 – 2019-02-28 (×3): 40 mg via INTRAVENOUS
  Filled 2019-02-27 (×3): qty 4

## 2019-02-27 NOTE — Progress Notes (Addendum)
IV RN went to patient room concerning "bleeding PICC line. Upon arrival, IV RN noticed patient RUA where PICC line was placed via interventional radiology (02/26/2019) was wrapped in ace bandaged and soaked in blood.  IV RN removed ace wrapped, then removed kerlix to get to PICC dressing.  PICC dressing removed along with thrombus pad.  PICC line found to be intact, PICC insertion site unremarkable. Proximal to the insertion site, "nick" in skin noted to be " pulsating" bleeding. Pressure was held to bleeding area for 20 minutes, bleeding appeared to have "slowed down" . Pressure dressing applied and new PICC dressing placed on insertion site. IV RN educated patient nurse to monitor patient extremity due to bleeding at the site and arm cool to touch. Recommendation of possible stitch may be needed for bleeding area.  Patient RN verbalized understanding

## 2019-02-27 NOTE — Progress Notes (Signed)
Tele visit with daughter

## 2019-02-27 NOTE — TOC Progression Note (Signed)
Transition of Care [facility]) - Progression Note    Patient Details  Name: Gabrielle Wright MRN: [id number] Date of Birth: 08-04-1946  Transition of Care [facility]) CM/SW Contact  Colleen Can RN, BSN, NCM-BC, [place] [phone] Phone Number: 02/27/2019, 11:08 AM  Clinical Narrative:    CM consult to eval for an LTACH is acknowledged. Patient has [place] Medicaid, therefore would not be considered an LTACH candidate; LTACH facilities does not accept [place] Medicaid. Patient may need a Vent SNF or CIR if patient can transition to TC. CM team will continue to follow.     Expected Discharge Plan: IP Rehab Facility Barriers to Discharge: Continued Medical Work up  Expected Discharge Plan and Services Expected Discharge Plan: IP Rehab Facility   Discharge Planning Services: CM Consult                               Social Determinants of Health (SDOH) Interventions    Readmission Risk Interventions No flowsheet data found.

## 2019-02-27 NOTE — Evaluation (Signed)
Physical Therapy Evaluation Patient Details Name: Gabrielle Wright MRN: [id number] DOB: Nov 12, 1946 Today's Date: 02/27/2019   History of Present Illness  73 y.o. female admitted on 02/03/19 for fever and cough (to [facility]) and was transferred to [facility] on 02/04/19 for hypotension and respiratory distress (acute respiratory failure/pulmonary edema) requiring intubation 3/10-current (time of PT eval 02/27/2019).  She was visiting family from [place] and COVID 19 tests were negative.  She was found to have group A strep bacteremia (septic shiock), rhinovirus,  and course complicated by NSTEMI and new LV dysfunction s/p cardiac cath on 02/09/19.  Cardiology following.  Other dx include ischemic cardiomyopathy, hyperkalemia, DM2- uncontrolled, and abdominal distention.  Pt with significant PMH of DM, HTN, anc CAD. Trach placed on 3/31  Clinical Impression  Pt admitted with above diagnosis. Pt currently with functional limitations due to the deficits listed below (see PT Problem List). Pt evaluated on vent, 40% FiO2. Pt lethargic but following most simple commands. Difficult to assess cognition due to language barrier. Performing bed mobility with two person total assistance. Unable to sit edge of bed without significant physical assistance. Able to participate in low level exercises. Further assessment limited due to PICC team arrival. Recommend maximove transfer out of bed. Pt will benefit from skilled PT to increase their independence and safety with mobility to allow discharge to the venue listed below.    Audio interpreter [phone] utilized     Follow Up Recommendations LTACH    Equipment Recommendations  Other (comment)(defer)    Recommendations for Other Services       Precautions / Restrictions Precautions Precautions: Fall;Other (comment) Precaution Comments: trach, vent Restrictions Weight Bearing Restrictions: No      Mobility  Bed Mobility Overal bed mobility: Needs  Assistance Bed Mobility: Supine to Sit;Sit to Supine     Supine to sit: +2 for safety/equipment;Total assist Sit to supine: +2 for safety/equipment;Total assist   General bed mobility comments: TotalA + 2 for all aspects of bed mobility  Transfers                    Ambulation/Gait                Stairs            Wheelchair Mobility    Modified Rankin (Stroke Patients Only)       Balance Overall balance assessment: Needs assistance Sitting-balance support: Feet supported;Bilateral upper extremity supported Sitting balance-Leahy Scale: Poor Sitting balance - Comments: maxA sitting EOB                                     Pertinent Vitals/Pain Pain Assessment: Faces Faces Pain Scale: No hurt    Home Living Family/patient expects to be discharged to:: Private residence Living Arrangements: Children;Other relatives Available Help at Discharge: Family;Available 24 hours/day                  Prior Function Level of Independence: Independent         Comments: pt visits the [place] 7 months out of the year, was at her daughter's x 10 days when she became ill     Hand Dominance   Dominant Hand: Right    Extremity/Trunk Assessment   Upper Extremity Assessment Upper Extremity Assessment: Generalized weakness    Lower Extremity Assessment Lower Extremity Assessment: Generalized weakness    Cervical / Trunk Assessment  Cervical / Trunk Assessment: Other exceptions Cervical / Trunk Exceptions: weakness  Communication   Communication: Prefers language other than English(Bengali)  Cognition Arousal/Alertness: Lethargic Behavior During Therapy: Flat affect Overall Cognitive Status: Difficult to assess                                 General Comments: Pt following most simple commands      General Comments      Exercises General Exercises - Lower Extremity Ankle Circles/Pumps: PROM;5  reps;Both;Supine Long Arc Quad: AROM;Both;10 reps Heel Slides: 5 reps;AAROM;Supine;Both   Assessment/Plan    PT Assessment Patient needs continued PT services  PT Problem List Decreased strength;Decreased activity tolerance;Decreased balance;Decreased mobility;Decreased knowledge of use of DME;Cardiopulmonary status limiting activity       PT Treatment Interventions Therapeutic activities;Therapeutic exercise;Balance training;Patient/family education;Neuromuscular re-education    PT Goals (Current goals can be found in the Care Plan section)  Acute Rehab PT Goals Patient Stated Goal: unable to state PT Goal Formulation: Patient unable to participate in goal setting Time For Goal Achievement: 03/13/19 Potential to Achieve Goals: Fair    Frequency Min 2X/week   Barriers to discharge        Co-evaluation               AM-PAC PT "6 Clicks" Mobility  Outcome Measure Help needed turning from your back to your side while in a flat bed without using bedrails?: Total Help needed moving from lying on your back to sitting on the side of a flat bed without using bedrails?: Total Help needed moving to and from a bed to a chair (including a wheelchair)?: Total Help needed standing up from a chair using your arms (e.g., wheelchair or bedside chair)?: Total Help needed to walk in hospital room?: Total Help needed climbing 3-5 steps with a railing? : Total 6 Click Score: 6    End of Session Equipment Utilized During Treatment: Oxygen Activity Tolerance: Patient limited by fatigue Patient left: in bed;with call bell/phone within reach Nurse Communication: Mobility status PT Visit Diagnosis: Muscle weakness (generalized) (M62.81);Difficulty in walking, not elsewhere classified (R26.2)    Time: 5364-6803 PT Time Calculation (min) (ACUTE ONLY): 27 min   Charges:   PT Evaluation $PT Eval High Complexity: 1 High PT Treatments $Therapeutic Activity: 8-22 mins        Laurina Bustle, PT, DPT Acute Rehabilitation Services Pager [phone] Office [phone]   Vanetta Mulders 02/27/2019, 2:04 PM

## 2019-02-27 NOTE — Progress Notes (Signed)
eLink Physician-Brief Progress Note Patient Name: Gabrielle Wright DOB: 10-16-1946 MRN: [id number]   Date of Service  02/27/2019  HPI/Events of Note  K+ = 2.6, Ca++ = 6.0and Creatinine = 0.9.  eICU Interventions  Will order: 1. Replace K+ and Ca++. 2. Repeat BMP at 2 PM.      Intervention Category Major Interventions: Electrolyte abnormality - evaluation and management  Sommer,Steven Eugene 02/27/2019, 5:42 AM

## 2019-02-27 NOTE — Progress Notes (Signed)
NAME:  Gabrielle Wright, MRN:  [id number], DOB:  [birth year], LOS: 23 ADMISSION DATE:  02/03/2019, CONSULTATION DATE: 02/04/2019 REFERRING MD: Emergency department physician cHIEF COMPLAINT: Fever respiratory failure  Brief History   73 year old elderly woman visiting from [place], admitted 3/10 with fevers, hypotension, found to have group A strep bacteremia, required mechanical ventilation Course complicated by non-STEMI and new LV dysfunction ? Ischemic vs Takatsubo's Failed extubation 3/18 due to acute pulmonary edema  Past Medical History  Hypertension Coronary artery disease Suspected diabetes review of her arterial consult receiving correct.  Significant Hospital IsEvents   02/04/2019 transfer from [facility] to [facility] required intubation and pressors. 3/16 off Levophed 3/18 extubated briefly but reintubated due to pulmonary edema 3/19 started milrinone 3/20 Episode of respiratory distress,  Precedex changed to propofol Atrial fibrillation RVR >>amiodarone bolus >> sinus rhythm 3/21 rising creat , AF-RVR >> amio bolus 3/24:rising creatinine 3/30: reintubated 3/31 trach 4/1 PICC  Consults:  02/04/2019 ID  IR   Procedures:  ETT 3/10 > 3/18, 3/18 > 3/31 Trach 3/31 > CVL 3/10 > 4/2 PICC 4/1 >   Significant Diagnostic Tests:  Echo 3/15 decreased EF 35 to 40%, LVEDP 27, no significant MR Cardiac cath >> LVEDP 40, RCA totally occluded, patent OM1 stent but distally occluded, apical LAD diffusely diseased, no intervention Chest x-ray 3/22  bilateral lower lobe airspace disease versus layering effusions CXR 3/23> pulmonary edema and bilateral pleural effusions. CXR 4/1> bibasilar opacities, smll R pleural effusion, tracheostomy tube present and appropriately positioned   Micro Data:  02/04/2019 blood cultures x2>> Gr A strep 02/04/2019 sputum culture>> neg 02/04/2019 urine culture>>neg 02/04/2019 respiratory virus panel>> rhinovirus 02/04/2019 flu a and B>>  neg coronavirus testing 02/04/2019>>neg resp 3/21 >> Klebsiella oxytoca, co-ag negative staph  Antimicrobials:  02/04/2019 vancomycin>>3/ 10 02/04/2019 Zosyn>> 3/10 Pen G 3/10 >> 3/22 (plan) ceftx 3/20 >>3/24 Cipro 3/24>> 3/31  Interim history/subjective:  Weaning at 12/5.  Somewhat anxious.  Asking for NGT to be removed. PICC placed yesterday, having some bleeding at insertion site.  IR to evaluate today.  Objective   Blood pressure (!) 128/46, pulse 67, temperature (!) 96.7 F (35.9 C), resp. rate 15, height 5\' 6"  (1.676 m), weight 76 kg, SpO2 100 %. CVP:  [9 mmHg-14 mmHg] 9 mmHg  Vent Mode: CPAP;PSV FiO2 (%):  [40 %] 40 % Set Rate:  [12 bmp] 12 bmp Vt Set:  [450 mL] 450 mL PEEP:  [8 cmH20] 8 cmH20 Pressure Support:  [10 cmH20-12 cmH20] 12 cmH20 Plateau Pressure:  [17 cmH20-25 cmH20] 17 cmH20   Intake/Output Summary (Last 24 hours) at 02/27/2019 0842 Last data filed at 02/27/2019 0700 Gross per 24 hour  Intake 598.11 ml  Output 1800 ml  Net -1201.89 ml   Filed Weights   02/24/19 0446 02/25/19 0500 02/26/19 0459  Weight: 79.3 kg 76.9 kg 76 kg   Examination: General: Adult female, resting in bed, in NAD. Neuro: Awake, follows basic commands, tries to mouth words. MAE's. HEENT: [place]/AT. Sclerae anicteric.  Trach in place. Cardiovascular: RRR, no M/R/G.  Lungs: Respirations even and unlabored.  CTA bilaterally, No W/R/R. Abdomen: BS x 4, soft, NT/ND.  Musculoskeletal: No gross deformities, no edema.  Skin: Intact, warm, no rashes.   Assessment & Plan:   Group A strep bacteremia - s/p abx. - No further interventions required. - Remove CVL once IR addresses PICC bleeding.  HCAP - Sputum Culture 3/21 + for Klebsiella Oxytoca and rare staph  Lugdenensis.  S/p  8 day course abx, last dose 3/31 - Bronchial hygiene.  Acute respiratory failure requiring mechanical ventilation - s/p trach 3/31. - Continue weaning trials with goal ATC. - Bronchial hygiene. - CXR intermittently.   Hypokalemia - s/p repletion. - Repeat BMP at 1400 and in AM.   Hypernatremia. - Continue free water per tube.  Ischemic Cardiomyopathy (Echo 3/26 EF 35-40%, LV diffuse hypokinesis) - no targets for revascularization on cath - Cards following. - Continue aspirin, Plavix, bidil, coreg. - Hold lasix.  Atrial Fibrillation with RVR. - Continue amio, coreg.  Diabetes type 2/uncontrolled hyperglycemia. - SSI resistant. - Hold am levemir, reduce to 32 units BID. - Continue tube feeds.  Abdominal distention/ Ileus - improved. - Minimize narcotics (fent gtt d/c'd 4/1). - Continue bowel regimen as ordered. - Continue simethicon.  Intermittent agitation - exacerbated by language barrier (family video conference has seemed to help). - Continue low dose precedex. - Continue clonazepam. - Assess QTc, if normal then consider adding low dose seroquel in attempt to get off precedex.  Acute Anemia - initially no evidence of blood loss but now has had bleeding from PICC after placement 4/1. - IR to evaluate PICC today. - Transfuse for Hgb < 7.   GI ppx: Protonix VAP: Continue Pain/Sedation: lose dose precedex / PRN clonazepam DVT ppx: SCDs / Heparin Diet: Enteral Nutrition Code Status: full Family Communication: none   CCT 30 minutes    Rutherford Guys, PA - C [facility] Pager: [phone] - [phone].  If no answer, [phone] - [phone] 02/27/2019, 9:00 AM

## 2019-02-28 LAB — BASIC METABOLIC PANEL
Anion gap: 12 (ref 5–15)
BUN: 78 mg/dL — ABNORMAL HIGH (ref 8–23)
CO2: 32 mmol/L (ref 22–32)
Calcium: 8.7 mg/dL — ABNORMAL LOW (ref 8.9–10.3)
Chloride: 106 mmol/L (ref 98–111)
Creatinine, Ser: 1.27 mg/dL — ABNORMAL HIGH (ref 0.44–1.00)
GFR calc Af Amer: 49 mL/min — ABNORMAL LOW (ref >60)
GFR calc non Af Amer: 42 mL/min — ABNORMAL LOW (ref >60)
Glucose, Bld: 132 mg/dL — ABNORMAL HIGH (ref 70–99)
Potassium: 3.6 mmol/L (ref 3.5–5.1)
Sodium: 150 mmol/L — ABNORMAL HIGH (ref 135–145)

## 2019-02-28 LAB — CBC
HCT: 31.5 % — ABNORMAL LOW (ref 36.0–46.0)
Hemoglobin: 8.6 g/dL — ABNORMAL LOW (ref 12.0–15.0)
MCH: 24.9 pg — ABNORMAL LOW (ref 26.0–34.0)
MCHC: 27.3 g/dL — ABNORMAL LOW (ref 30.0–36.0)
MCV: 91 fL (ref 80.0–100.0)
Platelets: 307 10*3/uL (ref 150–400)
RBC: 3.46 MIL/uL — ABNORMAL LOW (ref 3.87–5.11)
RDW: 19.3 % — ABNORMAL HIGH (ref 11.5–15.5)
WBC: 10.5 10*3/uL (ref 4.0–10.5)
nRBC: 0 % (ref 0.0–0.2)

## 2019-02-28 LAB — HEPATIC FUNCTION PANEL
ALT: 11 U/L (ref 0–44)
AST: 22 U/L (ref 15–41)
Albumin: 2.4 g/dL — ABNORMAL LOW (ref 3.5–5.0)
Alkaline Phosphatase: 53 U/L (ref 38–126)
Bilirubin, Direct: 0.1 mg/dL (ref 0.0–0.2)
Indirect Bilirubin: 0.4 mg/dL (ref 0.3–0.9)
Total Bilirubin: 0.5 mg/dL (ref 0.3–1.2)
Total Protein: 6.3 g/dL — ABNORMAL LOW (ref 6.5–8.1)

## 2019-02-28 LAB — GLUCOSE, CAPILLARY
Glucose-Capillary: 130 mg/dL — ABNORMAL HIGH (ref 70–99)
Glucose-Capillary: 149 mg/dL — ABNORMAL HIGH (ref 70–99)
Glucose-Capillary: 186 mg/dL — ABNORMAL HIGH (ref 70–99)
Glucose-Capillary: 189 mg/dL — ABNORMAL HIGH (ref 70–99)
Glucose-Capillary: 193 mg/dL — ABNORMAL HIGH (ref 70–99)
Glucose-Capillary: 204 mg/dL — ABNORMAL HIGH (ref 70–99)
Glucose-Capillary: 74 mg/dL (ref 70–99)

## 2019-02-28 LAB — PHOSPHORUS: Phosphorus: 3.6 mg/dL (ref 2.5–4.6)

## 2019-02-28 LAB — MAGNESIUM: Magnesium: 2.7 mg/dL — ABNORMAL HIGH (ref 1.7–2.4)

## 2019-02-28 MED ORDER — QUETIAPINE FUMARATE 25 MG PO TABS
25.0000 mg | ORAL_TABLET | Freq: Two times a day (BID) | ORAL | Status: DC
  Administered 2019-02-28 – 2019-03-01 (×4): 25 mg via ORAL
  Filled 2019-02-28 (×5): qty 1

## 2019-02-28 MED ORDER — OSMOLITE 1.2 CAL PO LIQD
1000.0000 mL | ORAL | Status: DC
  Administered 2019-02-28 – 2019-03-02 (×4): 1000 mL
  Filled 2019-02-28 (×6): qty 1000

## 2019-02-28 MED ORDER — VITAL AF 1.2 CAL PO LIQD: 1000.0000 mL | ORAL | Status: DC

## 2019-02-28 MED ORDER — PRO-STAT SUGAR FREE PO LIQD
30.0000 mL | Freq: Every day | ORAL | Status: DC
  Administered 2019-02-28 – 2019-03-06 (×7): 30 mL
  Filled 2019-02-28 (×7): qty 30

## 2019-02-28 MED ORDER — ATORVASTATIN CALCIUM 40 MG PO TABS: 40.0000 mg | ORAL_TABLET | Freq: Every day | ORAL | Status: DC

## 2019-02-28 MED ORDER — ATORVASTATIN CALCIUM 40 MG PO TABS
40.0000 mg | ORAL_TABLET | Freq: Every day | ORAL | Status: DC
  Administered 2019-02-28 – 2019-03-20 (×21): 40 mg
  Filled 2019-02-28 (×22): qty 1

## 2019-02-28 NOTE — Progress Notes (Addendum)
Nutrition Follow-up  DOCUMENTATION CODES:   Not applicable  INTERVENTION:   Change tube feeding:  -Osmolite 1.2 @ 60 ml/hr (1440 ml) via Cortrak -30 ml Prostat once daily -Free water flushes 250 ml QID per CCM  Provides: 1828 kcals, 95 grams protein, 1181 ml free water (2181 ml with flushes). Meets 100% of needs  NUTRITION DIAGNOSIS:   Inadequate oral intake related to inability to eat as evidenced by NPO status.  Ongoing  GOAL:   Patient will meet greater than or equal to 90% of their needs  Met with TF  MONITOR:   Vent status, TF tolerance, Labs, I & O's  REASON FOR ASSESSMENT:   Consult Enteral/tube feeding initiation and management  ASSESSMENT:   73 yo female with PMH of DM-2, HTN, CAD from [place] who was admitted with fever and respiratory failure requiring intubation. Admission complicated by NSTEMI and new LV dysfunction.   3/18- failed extubation 3/30- extubated  3/31- DNR rescinded, re-intubated, trach  4/3- post pyloric Cortrak placed   RD working remotely.  Pt tolerating Vital AF 1.2 @ 45 ml + 30 ml Prostat BID. Pt tolerating weaning trials on trach collar. RD to change tube feeding regimen to better meet pt's needs (will change formula to Osmolite 1.2 due to nation shortage of Vital AF 1.2).   Weight noted to decrease from 76.9 kg on 3/31 to 75.3 kg today. Will continue to monitor trends.   I/O: -4,861 ml since admit UOP: 1,600 ml x 24 hrs   Medications reviewed and include: SS novolog, levemir, liquid MVI, miralax Labs reviewed: Na 150 (H) Mg 2.7 (H) CBG 130-204  Diet Order:   Diet Order            Diet NPO time specified  Diet effective now              EDUCATION NEEDS:   No education needs have been identified at this time  Skin:  Skin Assessment: Reviewed RN Assessment  Last BM:  4/2- 500 ml rectal tube  Height:   Ht Readings from Last 1 Encounters:  02/04/19 _0  (1.676 m)    Weight:   Wt Readings from Last 1  Encounters:  02/28/19 75.3 kg    Ideal Body Weight:  59.1 kg  BMI:  Body mass index is 26.79 kg/m.  Estimated Nutritional Needs:   Kcal:  1700-1900 kcal  Protein:  85-105 grams  Fluid:  >/= 1.7 L/day   Mariana Single RD, LDN Clinical Nutrition Pager # - [phone]

## 2019-02-28 NOTE — Progress Notes (Signed)
Facilitated video phone call with nurse, pt, and pt daughter.

## 2019-02-28 NOTE — Progress Notes (Signed)
NAME:  Gabrielle Wright, MRN:  [id number], DOB:  04/17/1946, LOS: 24 ADMISSION DATE:  02/03/2019, CONSULTATION DATE: 02/04/2019 REFERRING MD: Emergency department physician cHIEF COMPLAINT: Fever respiratory failure  Brief History   73 year old elderly woman visiting from [place], admitted 3/10 with fevers, hypotension, found to have group A strep bacteremia, required mechanical ventilation Course complicated by non-STEMI and new LV dysfunction ? Ischemic vs Takatsubo's Failed extubation 3/18 due to acute pulmonary edema  Past Medical History  Hypertension Coronary artery disease Suspected diabetes review of her arterial consult receiving correct.  Significant Hospital IsEvents   02/04/2019 transfer from [facility] to [facility] required intubation and pressors. 3/16 off Levophed 3/18 extubated briefly but reintubated due to pulmonary edema 3/19 started milrinone 3/20 Episode of respiratory distress,  Precedex changed to propofol Atrial fibrillation RVR >>amiodarone bolus >> sinus rhythm 3/21 rising creat , AF-RVR >> amio bolus 3/24:rising creatinine 3/30: reintubated 3/31 trach 4/1 PICC  Consults:  02/04/2019 ID  IR   Procedures:  ETT 3/10 > 3/18, 3/18 > 3/31 Trach 3/31 > CVL 3/10 > 4/3 PICC 4/1 >   Significant Diagnostic Tests:  Echo 3/15 decreased EF 35 to 40%, LVEDP 27, no significant MR Cardiac cath >> LVEDP 40, RCA totally occluded, patent OM1 stent but distally occluded, apical LAD diffusely diseased, no intervention Chest x-ray 3/22  bilateral lower lobe airspace disease versus layering effusions CXR 3/23> pulmonary edema and bilateral pleural effusions. CXR 4/1> bibasilar opacities, smll R pleural effusion, tracheostomy tube present and appropriately positioned   Micro Data:  02/04/2019 blood cultures x2>> Gr A strep 02/04/2019 sputum culture>> neg 02/04/2019 urine culture>>neg 02/04/2019 respiratory virus panel>> rhinovirus 02/04/2019 flu a and B>>  neg coronavirus testing 02/04/2019>>neg resp 3/21 >> Klebsiella oxytoca, co-ag negative staph  Antimicrobials:  02/04/2019 vancomycin>>3/ 10 02/04/2019 Zosyn>> 3/10 Pen G 3/10 >> 3/22 (plan) ceftx 3/20 >>3/24 Cipro 3/24>> 3/31  Interim history/subjective:  Weaning at 8/5, just lowered to 5/5.  Goal is to attempt ATC around noon today. Of precedex last night.  Objective   Blood pressure (!) 113/52, pulse 62, temperature 98.3 F (36.8 C), temperature source Oral, resp. rate 17, height 5\' 6"  (1.676 m), weight 75.3 kg, SpO2 100 %. CVP:  [7 mmHg-10 mmHg] 9 mmHg  Vent Mode: CPAP;PSV FiO2 (%):  [40 %] 40 % Set Rate:  [12 bmp] 12 bmp Vt Set:  [450 mL] 450 mL PEEP:  [5 cmH20-8 cmH20] 5 cmH20 Pressure Support:  [8 cmH20] 8 cmH20 Plateau Pressure:  [6 cmH20-24 cmH20] 14 cmH20   Intake/Output Summary (Last 24 hours) at 02/28/2019 0805 Last data filed at 02/28/2019 0700 Gross per 24 hour  Intake 1227.34 ml  Output 1600 ml  Net -372.66 ml   Filed Weights   02/25/19 0500 02/26/19 0459 02/28/19 0500  Weight: 76.9 kg 76 kg 75.3 kg   Examination: General: Adult female, resting in bed, in NAD. Less agitated today. Neuro: Awake, follows basic commands, tries to mouth words. MAE's. HEENT: [place]/AT. Sclerae anicteric.  Trach in place. Cardiovascular: RRR, no M/R/G.  Lungs: Respirations even and unlabored.  CTA bilaterally, No W/R/R. Abdomen: BS x 4, soft, NT/ND.  Musculoskeletal: No gross deformities, no edema.  Skin: Intact, warm, no rashes.   Assessment & Plan:   Group A strep bacteremia - s/p abx. - No further interventions required. - Remove CVL now that PICC has stopped bleeding.  HCAP - Sputum Culture 3/21 + for Klebsiella Oxytoca and rare staph  Lugdenensis.  S/p 8  day course abx, last dose 3/31 - Bronchial hygiene.  Acute respiratory failure requiring mechanical ventilation - s/p trach 3/31. - Continue weaning trials with goal ATC (hope to attempt today at noon). - Bronchial  hygiene. - CXR intermittently.  Hypernatremia. - Continue free water per tube. - Hold further lasix.  Ischemic Cardiomyopathy (Echo 3/26 EF 35-40%, LV diffuse hypokinesis) - no targets for revascularization on cath - Cards following. - Continue aspirin, Plavix, bidil, coreg. - Hold further lasix.  Atrial Fibrillation with RVR. - Continue amio, coreg.  Diabetes type 2/uncontrolled hyperglycemia. - SSI resistant. - Hold am levemir, reduce to 32 units BID. - Continue tube feeds.  Abdominal distention/ Ileus - improved. - Minimize narcotics (fent gtt d/c'd 4/1). - Continue bowel regimen as ordered. - Continue simethicon.  Intermittent agitation - exacerbated by language barrier (family video conference has seemed to help). - Continue clonazepam. - Assess QTc, if normal then consider adding low dose seroquel in attempt to minimize fentanyl pushes.  Acute Anemia - initially no evidence of blood loss but now has had bleeding from PICC after placement 4/1. - IR to evaluate PICC today. - Transfuse for Hgb < 7.   GI ppx: Protonix VAP: Continue Pain/Sedation: PRN fentanyl / PRN clonazepam DVT ppx: SCDs / Heparin Diet: Enteral Nutrition Code Status: full Family Communication: none   CCT 30 minutes    Rutherford Guys, PA - C [place] [facility] Pager: [phone] - [phone].  If no answer, [phone] - [phone] 02/28/2019, 8:05 AM

## 2019-02-28 NOTE — Procedures (Signed)
Cortrak  Person Inserting Tube:  Gabrielle Wright, RD Tube Type:  Cortrak - 43 inches Tube Location:  Right nare Initial Placement:  Postpyloric Secured by: Bridle Technique Used to Measure Tube Placement:  Documented cm marking at nare/ corner of mouth Cortrak Secured At:  80 cm Procedure Comments:  Cortrak Tube Team Note:  Consult received to place a Cortrak feeding tube.   No x-ray is required. RN may begin using tube.   If the tube becomes dislodged please keep the tube and contact the Cortrak team at [email or web address] ([email or web address]) for replacement.  If after hours and replacement cannot be delayed, place a NG tube and confirm placement with an abdominal x-ray.   Romelle Starcher MS, RD, LDN, CNSC [phone] Pager  [phone] Weekend/On-Call Pager

## 2019-02-28 NOTE — Progress Notes (Signed)
Progress Note  Patient Name: Gabrielle Wright Date of Encounter: 02/28/2019  Primary Cardiologist: Minus Breeding, MD   Subjective   Alert; s/p trach  Inpatient Medications    Scheduled Meds:  amiodarone  200 mg Per Tube Daily   aspirin  81 mg Oral Daily   carvedilol  6.25 mg Per NG tube BID WC   chlorhexidine gluconate (MEDLINE KIT)  15 mL Mouth Rinse BID   Chlorhexidine Gluconate Cloth  6 each Topical Q0600   clonazepam  0.5 mg Per Tube BID   clopidogrel  75 mg Oral Daily   feeding supplement (PRO-STAT SUGAR FREE 64)  30 mL Per Tube BID   feeding supplement (VITAL AF 1.2 CAL)  1,000 mL Per Tube Q24H   fentaNYL (SUBLIMAZE) injection  50 mcg Intravenous Once   free water  250 mL Per Tube Q6H   furosemide  40 mg Intravenous Q12H   heparin injection (subcutaneous)  5,000 Units Subcutaneous Q8H   hydrALAZINE  10 mg Oral Q8H   insulin aspart  0-24 Units Subcutaneous Q4H   insulin detemir  50 Units Subcutaneous BID   isosorbide mononitrate  30 mg Oral Daily   mouth rinse  15 mL Mouth Rinse BID   multivitamin  15 mL Per Tube Daily   pantoprazole sodium  40 mg Per Tube QHS   polyethylene glycol  17 g Oral Daily   sodium chloride flush  10-40 mL Intracatheter Q12H   Thrombi-Pad  1 each Topical Once   Continuous Infusions:  sodium chloride Stopped (02/27/19 0619)   calcium gluconate Stopped (02/27/19 0656)   PRN Meds: acetaminophen (TYLENOL) oral liquid 160 mg/5 mL, acetaminophen, bisacodyl, dextrose, diphenhydrAMINE, fentaNYL (SUBLIMAZE) injection, hydrALAZINE, ipratropium-albuterol, lidocaine (PF), ondansetron (ZOFRAN) IV, sennosides, simethicone, sodium chloride flush   Vital Signs    Vitals:   02/28/19 0400 02/28/19 0500 02/28/19 0600 02/28/19 0700  BP: (!) 133/55 (!) 128/51 (!) 124/53 (!) 113/52  Pulse: 65 70 66 64  Resp: _0 Temp: 98.3 F (36.8 C)     TempSrc: Oral     SpO2: 100% 100% 100% 100%  Weight:  75.3 kg    Height:         Intake/Output Summary (Last 24 hours) at 02/28/2019 0710 Last data filed at 02/28/2019 0500 Gross per 24 hour  Intake 1182.34 ml  Output 1600 ml  Net -417.66 ml   Last 3 Weights 02/28/2019 02/26/2019 02/25/2019  Weight (lbs) 166 lb 0.1 oz 167 lb 8.8 oz 169 lb 8.5 oz  Weight (kg) 75.3 kg 76 kg 76.9 kg      Telemetry    NSR with rare PVC- Personally Reviewed  Physical Exam   GEN: Opens eyes; chronically ill appearing; NAD Neck: tracheostomy Cardiac:  RRR without murmur Respiratory: CTA anteriorly; no wheeze GI: Soft, mildly distended, not tender MS: no edema Neuro: Opens eyes   Labs    Chemistry Recent Labs  Lab 02/26/19 0500 02/27/19 0355 02/27/19 1402  NA 147* 151* 146*  K 3.5 2.4* 4.4  CL 104 121* 106  CO2 33* 24 34*  GLUCOSE 194* 173* 221*  BUN 91* 65* 83*  CREATININE 1.73* 0.98 1.30*  CALCIUM 8.6* 6.0* 8.6*  ALBUMIN 2.3*  --   --   GFRNONAA 29* 58* 41*  GFRAA 34* >60 47*  ANIONGAP _1 Hematology Recent Labs  Lab 02/25/19 0446 02/26/19 0500 02/27/19 0355  WBC 18.7* 11.2* 9.7  RBC 4.14 4.08  3.17*  HGB 10.4* 9.9* 7.9*  HCT 36.0 36.2 28.6*  MCV 87.0 88.7 90.2  MCH 25.1* 24.3* 24.9*  MCHC 28.9* 27.3* 27.6*  RDW 19.9* 19.9* 19.6*  PLT 447* 397 297     Radiology    Dg Chest Port 1 View  Result Date: 02/27/2019 CLINICAL DATA:  Ventilator, tracheostomy EXAM: PORTABLE CHEST 1 VIEW COMPARISON:  02/25/2019 FINDINGS: Tracheostomy and left central line are unchanged. Interval placement of right PICC line with the tip in the SVC. NG tube enters the stomach. Cardiomegaly. Layering bilateral effusions with bilateral airspace disease, slightly worsening since prior study. No acute bony abnormality. IMPRESSION: Moderate layering bilateral effusions. Worsening diffuse bilateral airspace disease which could reflect edema or infection. Electronically Signed   By: Rolm Baptise M.D.   On: 02/27/2019 07:55   Ir Picc Placement Right >5 Yrs Inc Img Guide  Result  Date: 02/26/2019 INDICATION: Poor venous access. In need of durable intravenous access for medication administration and blood draws. EXAM: ULTRASOUND AND FLUOROSCOPIC GUIDED PICC LINE INSERTION MEDICATIONS: None. CONTRAST:  None FLUOROSCOPY TIME:  36 seconds (1.2 mGy) COMPLICATIONS: None immediate. TECHNIQUE: The procedure, risks, benefits, and alternatives were explained to the patient and informed written consent was obtained. A timeout was performed prior to the initiation of the procedure. The right upper extremity was prepped with chlorhexidine in a sterile fashion, and a sterile drape was applied covering the operative field. Maximum barrier sterile technique with sterile gowns and gloves were used for the procedure. A timeout was performed prior to the initiation of the procedure. Local anesthesia was provided with 1% lidocaine. Under direct ultrasound guidance, the basilic vein was accessed with a micropuncture kit after the overlying soft tissues were anesthetized with 1% lidocaine. After the overlying soft tissues were anesthetized, a small venotomy incision was created and a micropuncture kit was utilized to access the right basilic vein. Real-time ultrasound guidance was utilized for vascular access including the acquisition of a permanent ultrasound image documenting patency of the accessed vessel. A guidewire was advanced to the level of the superior caval-atrial junction for measurement purposes and the PICC line was cut to length. A peel-away sheath was placed and a 40 cm, 5 [place], dual lumen was inserted to level of the superior caval-atrial junction. A post procedure spot fluoroscopic was obtained. The catheter easily aspirated and flushed and was sutured in place. A dressing was placed. The patient tolerated the procedure well without immediate post procedural complication. FINDINGS: After catheter placement, the tip lies within the superior cavoatrial junction. The catheter aspirates and flushes  normally and is ready for immediate use. IMPRESSION: Successful ultrasound and fluoroscopic guided placement of a right basilic vein approach, 40 cm, 5 French, dual lumen PICC with tip at the superior caval-atrial junction. The PICC line is ready for immediate use. Electronically Signed   By: Sandi Mariscal M.D.   On: 02/26/2019 14:36    Cardiac Studies   Echocardiogram March 2020-  1. The left ventricle has moderately reduced systolic function, with an ejection fraction of 35-40%. The cavity size was normal. Left ventricular diastolic Doppler parameters are consistent with pseudonormalization. Elevated left ventricular end-diastolic pressure The E/e' is 67. Regional wall motion abnormalities (see coded diagram below).  2. The right ventricle has mildly reduced systolic function. The cavity was normal. There is no increase in right ventricular wall thickness.  3. No hemodynamically significant valvular heart disease.  4. Normal biatrial chamber size.  5. IVC not well visualized.  Cardiac  catheterization March 2020-  2nd Mrg-3 lesion is 80% stenosed.  Acute Mrg lesion is 90% stenosed.    Less than 25% left main  Proximal diffuse 30 to 40% narrowing within the previously placed LAD stent.  The entire apical LAD is diffusely diseased with up to 85% stenosis.  The first diagonal contains 85% ostial narrowing.  Circumflex contains diffuse proximal to mid eccentric 50% stenosis the dominant second obtuse marginal contains mid vessel 50% stenosis in the mid to distal portion of this obtuse marginal there is a patent stent with 20% narrowing, and just distal to the stent there is an 80% thrombotic stenosis.  The RCA is dominant containing 50% mid vessel stenosis, 70% distal stenosis, 70% ostial PDA stenosis, and total occlusion of the mid vessel that fills by collaterals from the left ventricular apex.  Left ventriculography is not performed.  Today's echo was reviewed and there is anteroapical  severe hypokinesis and estimated ejection fraction of 40%.  LVEDP by Cath Lab hemodynamics is 40 mmHg.  RECOMMENDATIONS:   Acute on chronic combined systolic and diastolic heart failure with extremely elevated LVEDP greater than or equal to 40 mmHg related to global ischemia in the setting of norepinephrine therapy.  Recommend clopidogrel therapy if possible given stents in multiple territories and evidence of active thrombus beyond the stent in the obtuse marginal.  Current anatomy does not suggest that a role for coronary intervention.  35 cc of contrast used.  Given diabetes/shock state/intrinsic kidney disease, a bump in creatinine is still highly likely.  Kidney function needs to be monitored.  Patient Profile     73 y.o. female visiting from [place] admitted March 10 with fever, hypotension and found to have group A strep bacteremia requiring intubation.  Also ruled in for non-ST elevation myocardial infarction. Had cath and medical therapy recommended. Blood cultures March 10 showed group A strep.  Respiratory virus panel showed rhinovirus.  Influenza A and B negative.  Coronavirus negative.  Note LV dysfunction possibly secondary to stress cardiomyopathy.  Assessment & Plan    1 paroxysmal atrial fibrillation-remains in sinus rhythm.  Continue amiodarone and Coreg; continue subcutaneous heparin.  Atrial fibrillation likely related to stress of pneumonia. No recurrences.  2 NSTEMI/coronary artery disease-plan medical therapy with ASA, plavix; add statin later.  3 cardiomyopathy-likely ischemic.  Not a candidate for ARB or Entresto given severity of renal insufficiency; continue hydralazine/nitrates. Continue  Coreg.  4 acute combined systolic/diastolic congestive heart failure-I/O - 372. Coox 82; CVP 9. Appears euvolemic. BMET pending. Would hold lasix.   5 pneumonia-management per critical care medicine.  6 ventilator dependent respiratory failure-pt failed extubation; now  s/p trach.  7 acute on chronc stage 3 kidney disease-await bmet.  We will see again Monday; please call prior to then with questions.  For questions or updates, please contact [place] Please consult [email or web address] for contact info under        Signed, Kirk Ruths, MD  02/28/2019, 7:10 AM

## 2019-03-01 LAB — CBC
HCT: 29.9 % — ABNORMAL LOW (ref 36.0–46.0)
Hemoglobin: 8.2 g/dL — ABNORMAL LOW (ref 12.0–15.0)
MCH: 24.4 pg — ABNORMAL LOW (ref 26.0–34.0)
MCHC: 27.4 g/dL — ABNORMAL LOW (ref 30.0–36.0)
MCV: 89 fL (ref 80.0–100.0)
Platelets: 249 10*3/uL (ref 150–400)
RBC: 3.36 MIL/uL — ABNORMAL LOW (ref 3.87–5.11)
RDW: 18.9 % — ABNORMAL HIGH (ref 11.5–15.5)
WBC: 9.7 10*3/uL (ref 4.0–10.5)
nRBC: 0 % (ref 0.0–0.2)

## 2019-03-01 LAB — BASIC METABOLIC PANEL
Anion gap: 8 (ref 5–15)
BUN: 63 mg/dL — ABNORMAL HIGH (ref 8–23)
CO2: 31 mmol/L (ref 22–32)
Calcium: 8.3 mg/dL — ABNORMAL LOW (ref 8.9–10.3)
Chloride: 108 mmol/L (ref 98–111)
Creatinine, Ser: 1.12 mg/dL — ABNORMAL HIGH (ref 0.44–1.00)
GFR calc Af Amer: 57 mL/min — ABNORMAL LOW (ref >60)
GFR calc non Af Amer: 49 mL/min — ABNORMAL LOW (ref >60)
Glucose, Bld: 182 mg/dL — ABNORMAL HIGH (ref 70–99)
Potassium: 3.6 mmol/L (ref 3.5–5.1)
Sodium: 147 mmol/L — ABNORMAL HIGH (ref 135–145)

## 2019-03-01 LAB — GLUCOSE, CAPILLARY
Glucose-Capillary: 165 mg/dL — ABNORMAL HIGH (ref 70–99)
Glucose-Capillary: 184 mg/dL — ABNORMAL HIGH (ref 70–99)
Glucose-Capillary: 198 mg/dL — ABNORMAL HIGH (ref 70–99)
Glucose-Capillary: 218 mg/dL — ABNORMAL HIGH (ref 70–99)
Glucose-Capillary: 243 mg/dL — ABNORMAL HIGH (ref 70–99)
Glucose-Capillary: 246 mg/dL — ABNORMAL HIGH (ref 70–99)
Glucose-Capillary: 256 mg/dL — ABNORMAL HIGH (ref 70–99)

## 2019-03-01 LAB — PHOSPHORUS: Phosphorus: 3.6 mg/dL (ref 2.5–4.6)

## 2019-03-01 LAB — MAGNESIUM: Magnesium: 2.7 mg/dL — ABNORMAL HIGH (ref 1.7–2.4)

## 2019-03-01 MED ORDER — PSYLLIUM 95 % PO PACK
1.0000 | PACK | Freq: Every day | ORAL | Status: DC
  Administered 2019-03-01 – 2019-03-12 (×12): 1
  Filled 2019-03-01 (×12): qty 1

## 2019-03-01 NOTE — Progress Notes (Signed)
Video call completed with family

## 2019-03-01 NOTE — Progress Notes (Signed)
NAME:  Gabrielle Wright, MRN:  [id number], DOB:  04/24/1946, LOS: 25 ADMISSION DATE:  02/03/2019, CONSULTATION DATE: 02/04/2019 REFERRING MD: Emergency department physician CHIEF COMPLAINT: Fever respiratory failure  Brief History   73 year old elderly woman visiting from [place], admitted 3/10 with fevers, hypotension, found to have group A strep bacteremia, required mechanical ventilation Course complicated by non-STEMI and new LV dysfunction ? Ischemic vs Takatsubo's Failed extubation 3/18 due to acute pulmonary edema  Past Medical History  Hypertension Coronary artery disease Suspected diabetes review of her arterial consult receiving correct.  Significant Hospital IsEvents   02/04/2019 transfer from [facility] to [facility] required intubation and pressors. 3/16 off Levophed 3/18 extubated briefly but reintubated due to pulmonary edema 3/19 started milrinone 3/20 Episode of respiratory distress,  Precedex changed to propofol Atrial fibrillation RVR >>amiodarone bolus >> sinus rhythm 3/21 rising creat , AF-RVR >> amio bolus 3/24:rising creatinine 3/30: reintubated 3/31 trach 4/1 PICC  Consults:  02/04/2019 ID  IR   Procedures:  ETT 3/10 > 3/18, 3/18 > 3/31 Trach 3/31 > CVL 3/10 > 4/3 PICC 4/1 >   Significant Diagnostic Tests:  Echo 3/15 decreased EF 35 to 40%, LVEDP 27, no significant MR Cardiac cath >> LVEDP 40, RCA totally occluded, patent OM1 stent but distally occluded, apical LAD diffusely diseased, no intervention Chest x-ray 3/22  bilateral lower lobe airspace disease versus layering effusions CXR 3/23> pulmonary edema and bilateral pleural effusions. CXR 4/1> bibasilar opacities, smll R pleural effusion, tracheostomy tube present and appropriately positioned   Micro Data:  02/04/2019 blood cultures x2>> Gr A strep 02/04/2019 sputum culture>> neg 02/04/2019 urine culture>>neg 02/04/2019 respiratory virus panel>> rhinovirus 02/04/2019 flu a and B>>  neg coronavirus testing 02/04/2019>>neg resp 3/21 >> Klebsiella oxytoca, co-ag negative staph  Antimicrobials:  02/04/2019 vancomycin>>3/ 10 02/04/2019 Zosyn>> 3/10 Pen G 3/10 >> 3/22 (plan) ceftx 3/20 >>3/24 Cipro 3/24>> 3/31  Interim history/subjective:  Tolerated several hours of trach collar yesterday.  Tired after 3 hours today with increased somnolence.  Objective   Blood pressure (!) 105/42, pulse 74, temperature 98.1 F (36.7 C), temperature source Oral, resp. rate 17, height 5\' 6"  (1.676 m), weight 75.9 kg, SpO2 100 %. CVP:  [10 mmHg-14 mmHg] 10 mmHg  Vent Mode: PRVC FiO2 (%):  [35 %-40 %] 35 % Set Rate:  [12 bmp] 12 bmp Vt Set:  [450 mL] 450 mL PEEP:  [5 cmH20] 5 cmH20 Plateau Pressure:  [19 cmH20-23 cmH20] 19 cmH20   Intake/Output Summary (Last 24 hours) at 03/01/2019 1618 Last data filed at 03/01/2019 0700 Gross per 24 hour  Intake 1581.04 ml  Output 975 ml  Net 606.04 ml   Filed Weights   02/26/19 0459 02/28/19 0500 03/01/19 0500  Weight: 76 kg 75.3 kg 75.9 kg   Examination: General: Adult female, resting in bed, in NAD. Less agitated today. Neuro: Awake, follows basic commands, tries to mouth words. MAE's. HEENT: [place]/AT. Sclerae anicteric.  Trach in place. Cardiovascular: RRR, no M/R/G.  Lungs: Respirations even and unlabored initially.  CTA bilaterally, No W/R/R. Abdomen: BS x 4, soft, NT/ND.  Musculoskeletal: No gross deformities, no edema.  Skin: Intact, warm, no rashes.   Assessment & Plan:   Acute respiratory failure requiring mechanical ventilation - s/p trach 3/31. - Continue weaning trials with goal ATC (hope to attempt today at noon). - Bronchial hygiene. - CXR intermittently.  Hypernatremia. - Continue free water per tube. - Hold further lasix.  Ischemic Cardiomyopathy (Echo 3/26 EF 35-40%, LV  diffuse hypokinesis) - no targets for revascularization on cath - Cards following. - Continue aspirin, Plavix, bidil, coreg. - Hold further lasix.   Atrial Fibrillation with RVR. - Continue amio, coreg.  Diabetes type 2/uncontrolled hyperglycemia. - SSI resistant. - Continue levimir 32 units BID. - Continue tube feeds.  Intermittent agitation - exacerbated by language barrier (family video conference has seemed to help). - Continue clonazepam. - Assess QTc, if normal then consider adding low dose seroquel in attempt to minimize fentanyl pushes.   GI ppx: Protonix VAP: Continue Pain/Sedation: PRN fentanyl / PRN clonazepam DVT ppx: SCDs / Heparin Diet: Enteral Nutrition Code Status: full Family Communication: none   Lynnell Catalan, MD [facility] ICU Physician [facility]  Pager: [phone] Mobile: [phone] After hours: [phone].  03/01/2019, 4:23 PM

## 2019-03-01 NOTE — Progress Notes (Signed)
SLP Cancellation Note  Patient Details Name: Lexandra Haseman MRN: [id number] DOB: 1946-09-03   Cancelled treatment:       Reason Eval/Treat Not Completed: Patient not medically ready. Back on vent this pm, more fatigued today. Will check back for PMV readiness.    [place], Riley Nearing 03/01/2019, 3:04 PM

## 2019-03-01 NOTE — Plan of Care (Signed)
Making small strides at weaning. Was off ventilator on trach collar for 3 hours today c/o : breathing difficulty.  O2 saturations ?95% but work of breathing increased. Through family and interpreters discussed importance of working , building up diaphragm and muscles.  Resting on vent RR down to 14.  Care management consult to start looking at [facility].

## 2019-03-01 NOTE — Progress Notes (Signed)
Patient anxious, reassured, used interpreter service. Patient nodding yes to breathing difficulty. Had switched over to trach collar. Talked about need to work, breath. Oxygen levels good. Calling on call bell even though nurse in room. Hand held stayed in room with patient for over a hour. Music played for patient ( bengali) daughter called and  I left her a message, patient wished to have her remote in.

## 2019-03-01 NOTE — Plan of Care (Signed)
Facilitated video visit with family

## 2019-03-02 LAB — GLUCOSE, CAPILLARY
Glucose-Capillary: 192 mg/dL — ABNORMAL HIGH (ref 70–99)
Glucose-Capillary: 173 mg/dL — ABNORMAL HIGH (ref 70–99)
Glucose-Capillary: 264 mg/dL — ABNORMAL HIGH (ref 70–99)
Glucose-Capillary: 197 mg/dL — ABNORMAL HIGH (ref 70–99)
Glucose-Capillary: 191 mg/dL — ABNORMAL HIGH (ref 70–99)

## 2019-03-02 MED ORDER — QUETIAPINE FUMARATE 25 MG PO TABS
25.0000 mg | ORAL_TABLET | Freq: Every day | ORAL | Status: DC
  Administered 2019-03-02 – 2019-03-03 (×2): 25 mg via ORAL
  Filled 2019-03-02 (×2): qty 1

## 2019-03-02 MED ORDER — WHITE PETROLATUM EX OINT
TOPICAL_OINTMENT | CUTANEOUS | Status: AC
  Administered 2019-03-02: 09:00:00
  Filled 2019-03-02: qty 28.35

## 2019-03-02 MED ORDER — CLONAZEPAM 0.5 MG PO TBDP
0.5000 mg | ORAL_TABLET | Freq: Three times a day (TID) | ORAL | Status: DC
  Administered 2019-03-02 – 2019-03-12 (×31): 0.5 mg
  Filled 2019-03-02 (×32): qty 1

## 2019-03-02 NOTE — Plan of Care (Signed)
Wean trial poor today only tolerated for 1/2 each time x 2 . Encouraged to work muscles. Anxious when on trach collar. Discussed at length with daughter

## 2019-03-02 NOTE — Progress Notes (Signed)
Patient placed on 40% ATC at this time. Patient tolerating well. RT will continue to monitor.

## 2019-03-02 NOTE — Progress Notes (Signed)
Tried again on trach collar used interpreter to encourage patient, to tell her she needs to do her best to work her muscles. Patient anxious throughout trach collar trial, on about 30 minutes, with patient the entire time, c/o headache not being able to breathe. rr 32 o2 saturations staying  90-92 at hte end dropping to 87%. Placed back on ventilator, RT at bedside.

## 2019-03-02 NOTE — Plan of Care (Signed)
Elink:  Video phone call with family 

## 2019-03-02 NOTE — Progress Notes (Signed)
NAME:  Gabrielle Wright, MRN:  [id number], DOB:  10-24-1946, LOS: 26 ADMISSION DATE:  02/03/2019, CONSULTATION DATE: 02/04/2019 REFERRING MD: Emergency department physician CHIEF COMPLAINT: Fever respiratory failure  Brief History   73 year old elderly woman visiting from [place], admitted 3/10 with fevers, hypotension, found to have group A strep bacteremia, required mechanical ventilation Course complicated by non-STEMI and new LV dysfunction ? Ischemic vs Takatsubo's Failed extubation 3/18 due to acute pulmonary edema.  Past Medical History  Hypertension Coronary artery disease Suspected diabetes.  Significant Hospital IsEvents   02/04/2019 transfer from [facility] to [facility] required intubation and pressors. 3/16 off Levophed 3/18 extubated briefly but reintubated due to pulmonary edema 3/19 started milrinone 3/20 Episode of respiratory distress,  Precedex changed to propofol Atrial fibrillation RVR >>amiodarone bolus >> sinus rhythm 3/21 rising creat , AF-RVR >> amio bolus 3/24:rising creatinine 3/30: reintubated 3/31 trach 4/1 PICC  Consults:  02/04/2019 ID  IR   Procedures:  ETT 3/10 > 3/18, 3/18 > 3/31 Trach 3/31 > CVL 3/10 > 4/3 PICC 4/1 >   Significant Diagnostic Tests:  Echo 3/15 decreased EF 35 to 40%, LVEDP 27, no significant MR Cardiac cath >> LVEDP 40, RCA totally occluded, patent OM1 stent but distally occluded, apical LAD diffusely diseased, no intervention Chest x-ray 3/22  bilateral lower lobe airspace disease versus layering effusions CXR 3/23> pulmonary edema and bilateral pleural effusions. CXR 4/1> bibasilar opacities, smll R pleural effusion, tracheostomy tube present and appropriately positioned   Micro Data:  02/04/2019 blood cultures x2>> Gr A strep 02/04/2019 sputum culture>> neg 02/04/2019 urine culture>>neg 02/04/2019 respiratory virus panel>> rhinovirus 02/04/2019 flu a and B>> neg coronavirus testing 02/04/2019>>neg resp  3/21 >> Klebsiella oxytoca, co-ag negative staph  Antimicrobials:  02/04/2019 vancomycin>>3/ 10 02/04/2019 Zosyn>> 3/10 Pen G 3/10 >> 3/22 (plan) ceftx 3/20 >>3/24 Cipro 3/24>> 3/31  Interim history/subjective:  Short period on trach collar. Periodic agitation.  Objective   Blood pressure (!) 96/56, pulse 81, temperature 98.2 F (36.8 C), temperature source Oral, resp. rate 16, height 5\' 6"  (1.676 m), weight 76.8 kg, SpO2 100 %. CVP:  [4 mmHg-10 mmHg] 6 mmHg  Vent Mode: PRVC FiO2 (%):  [30 %-40 %] 40 % Set Rate:  [12 bmp] 12 bmp Vt Set:  [450 mL] 450 mL PEEP:  [5 cmH20] 5 cmH20 Plateau Pressure:  [13 cmH20-24 cmH20] 23 cmH20   Intake/Output Summary (Last 24 hours) at 03/02/2019 1051 Last data filed at 03/02/2019 0600 Gross per 24 hour  Intake 1609.84 ml  Output 1400 ml  Net 209.84 ml   Filed Weights   02/28/19 0500 03/01/19 0500 03/02/19 0500  Weight: 75.3 kg 75.9 kg 76.8 kg   Examination: General: Adult female, resting in bed, in NAD. Calm today. Neuro: Awake, follows basic commands, tries to mouth words. MAE's. HEENT: [place]/AT. Sclerae anicteric.  Trach in place. Cardiovascular: RRR, no M/R/G.  Lungs: Respirations even and unlabored initially.  CTA bilaterally, No W/R/R. Abdomen: BS x 4, soft, NT/ND.  Musculoskeletal: No gross deformities, no edema.  Skin: Intact, warm, no rashes.   Assessment & Plan:   Acute respiratory failure requiring mechanical ventilation - s/p trach 3/31. - Trach collar today with rest overnight.. - Progressive mobilization.  Hypernatremia. - Continue free water per tube. - Hold further lasix.  Ischemic Cardiomyopathy (Echo 3/26 EF 35-40%, LV diffuse hypokinesis) - no targets for revascularization on cath - Cards following. - Continue aspirin, Plavix, bidil, coreg. - Hold further lasix.  Atrial Fibrillation with  RVR. - Continue amio, coreg.  Diabetes type 2/uncontrolled hyperglycemia. - SSI resistant. - Continue levimir 32 units BID.  - Continue tube feeds.  Intermittent agitation - exacerbated by language barrier (family video conference has seemed to help). - Continue clonazepam. - Stop daytime Seroquel to prevent over-sedation  GI ppx: Protonix VAP: Continue Pain/Sedation: PRN fentanyl / PRN clonazepam DVT ppx: SCDs / Heparin Diet: Enteral Nutrition Code Status: full Family Communication: none   Lynnell Catalan, MD [facility] ICU Physician [facility] [place] Critical [facility]  Pager: [phone] Mobile: [phone] After hours: [phone].  03/02/2019, 10:51 AM

## 2019-03-02 NOTE — Progress Notes (Signed)
Long discussion with daughter about patient status, patient not  Able to wean today. Looking toward future, PEG, Placement, would patient want to be on vent for long period of time. Family unable to care for patient at home if has complete care needs concerned if she could get help at home if she eventually does come home. . Daughter stated she saw that mom wanted to " take everything out" at one point. Overall looks depressed.Not candidate to [facility] currently due to insurance. Family will remote in later and encourage patient to participate in care. Palliative consult would be warranted to assist with goals of care.  Will discuss with MD.

## 2019-03-02 NOTE — Progress Notes (Signed)
transistioned to trach collar, pa tturned to side, changed foam pad on sacrum, discontinued flexiseal.  Patient c/o short breath, tachpnic. Sat up on side of bed, dangled, encouraged patient to relax, mimic my breathing, patient trying pursed lip breathing. RR climbing, saturations decreased, more anxious. Stayed in room with patient. Patient not wanting to sit up, keeps flopping down in bed. Placed back on ventilator. Previous settings. Patient eyes closed.  Will try trach collar again this afternoon. Plan: to give rest periods with periods of work to strengthen core, increase muscle strength. Get up in chair today on vent.

## 2019-03-02 NOTE — Progress Notes (Signed)
SLP Cancellation Note  Patient Details Name: Gabrielle Wright MRN: [id number] DOB: July 27, 1946   Cancelled treatment:       Reason Eval/Treat Not Completed: Patient not medically ready. Pt currently on vent. SLP will continue to follow pt.   Pankaj Haack I. Vear Clock, MS, CCC-SLP Acute Rehabilitation Services Office number [phone] Pager [phone]  Scheryl Marten 03/02/2019, 8:14 AM

## 2019-03-02 NOTE — Progress Notes (Signed)
Patient more despondent, not involved in care, did not want to help turn or turn.  Closing eyes. Encouraged activity moving legs and arms. Did not  Move when asked. Shook head no.

## 2019-03-03 ENCOUNTER — Inpatient Hospital Stay (HOSPITAL_COMMUNITY): Payer: Medicaid Other

## 2019-03-03 LAB — BASIC METABOLIC PANEL
Anion gap: 9 (ref 5–15)
BUN: 50 mg/dL — ABNORMAL HIGH (ref 8–23)
CO2: 29 mmol/L (ref 22–32)
Calcium: 7.9 mg/dL — ABNORMAL LOW (ref 8.9–10.3)
Chloride: 103 mmol/L (ref 98–111)
Creatinine, Ser: 1.09 mg/dL — ABNORMAL HIGH (ref 0.44–1.00)
GFR calc Af Amer: 59 mL/min — ABNORMAL LOW (ref >60)
GFR calc non Af Amer: 51 mL/min — ABNORMAL LOW (ref >60)
Glucose, Bld: 223 mg/dL — ABNORMAL HIGH (ref 70–99)
Potassium: 3.7 mmol/L (ref 3.5–5.1)
Sodium: 141 mmol/L (ref 135–145)

## 2019-03-03 LAB — CBC WITH DIFFERENTIAL/PLATELET
Abs Immature Granulocytes: 0.05 10*3/uL (ref 0.00–0.07)
Basophils Absolute: 0 10*3/uL (ref 0.0–0.1)
Basophils Relative: 0 %
Eosinophils Absolute: 0.5 10*3/uL (ref 0.0–0.5)
Eosinophils Relative: 4 %
HCT: 29.4 % — ABNORMAL LOW (ref 36.0–46.0)
Hemoglobin: 8.1 g/dL — ABNORMAL LOW (ref 12.0–15.0)
Immature Granulocytes: 0 %
Lymphocytes Relative: 15 %
Lymphs Abs: 1.8 10*3/uL (ref 0.7–4.0)
MCH: 24.5 pg — ABNORMAL LOW (ref 26.0–34.0)
MCHC: 27.6 g/dL — ABNORMAL LOW (ref 30.0–36.0)
MCV: 88.8 fL (ref 80.0–100.0)
Monocytes Absolute: 0.7 10*3/uL (ref 0.1–1.0)
Monocytes Relative: 6 %
Neutro Abs: 8.9 10*3/uL — ABNORMAL HIGH (ref 1.7–7.7)
Neutrophils Relative %: 75 %
Platelets: 210 10*3/uL (ref 150–400)
RBC: 3.31 MIL/uL — ABNORMAL LOW (ref 3.87–5.11)
RDW: 19.2 % — ABNORMAL HIGH (ref 11.5–15.5)
WBC: 12 10*3/uL — ABNORMAL HIGH (ref 4.0–10.5)
nRBC: 0 % (ref 0.0–0.2)

## 2019-03-03 LAB — BRAIN NATRIURETIC PEPTIDE: B Natriuretic Peptide: 596.6 pg/mL — ABNORMAL HIGH (ref 0.0–100.0)

## 2019-03-03 LAB — GLUCOSE, CAPILLARY
Glucose-Capillary: 117 mg/dL — ABNORMAL HIGH (ref 70–99)
Glucose-Capillary: 162 mg/dL — ABNORMAL HIGH (ref 70–99)
Glucose-Capillary: 171 mg/dL — ABNORMAL HIGH (ref 70–99)
Glucose-Capillary: 183 mg/dL — ABNORMAL HIGH (ref 70–99)
Glucose-Capillary: 185 mg/dL — ABNORMAL HIGH (ref 70–99)
Glucose-Capillary: 186 mg/dL — ABNORMAL HIGH (ref 70–99)
Glucose-Capillary: 210 mg/dL — ABNORMAL HIGH (ref 70–99)

## 2019-03-03 LAB — TROPONIN I
Troponin I: 0.11 ng/mL (ref <0.03)
Troponin I: 0.12 ng/mL (ref <0.03)

## 2019-03-03 MED ORDER — NON FORMULARY: 237.0000 mL | Freq: Four times a day (QID) | Status: DC

## 2019-03-03 MED ORDER — NITROGLYCERIN 0.4 MG/HR TD PT24
0.4000 mg | MEDICATED_PATCH | Freq: Every day | TRANSDERMAL | Status: DC
  Administered 2019-03-03: 0.4 mg via TRANSDERMAL
  Filled 2019-03-03 (×2): qty 1

## 2019-03-03 MED ORDER — GLUCERNA PO LIQD
237.0000 mL | Freq: Four times a day (QID) | ORAL | Status: DC
  Administered 2019-03-03: 237 mL
  Filled 2019-03-03 (×2): qty 237

## 2019-03-03 MED ORDER — FUROSEMIDE 10 MG/ML IJ SOLN
80.0000 mg | Freq: Once | INTRAMUSCULAR | Status: AC
  Administered 2019-03-03: 80 mg via INTRAVENOUS
  Filled 2019-03-03: qty 8

## 2019-03-03 MED ORDER — GLUCERNA 1.5 CAL PO LIQD
237.0000 mL | Freq: Four times a day (QID) | ORAL | Status: DC
  Administered 2019-03-03 – 2019-03-06 (×10): 237 mL
  Filled 2019-03-03 (×16): qty 237

## 2019-03-03 MED ORDER — GLUCERNA 1.5 CAL PO LIQD
237.0000 mL | Freq: Four times a day (QID) | ORAL | Status: DC
  Filled 2019-03-03 (×2): qty 1000

## 2019-03-03 NOTE — Progress Notes (Addendum)
NAME:  Gabrielle Wright, MRN:  [id number], DOB:  1946/09/01, LOS: 27 ADMISSION DATE:  02/03/2019, CONSULTATION DATE: 02/04/2019 REFERRING MD: Emergency department physician CHIEF COMPLAINT: Fever respiratory failure  Brief History   73 year old elderly woman visiting from [place], admitted 3/10 with fevers, hypotension, found to have group A strep bacteremia, required mechanical ventilation Course complicated by non-STEMI and new LV dysfunction ? Ischemic vs Takatsubo's Failed extubation 3/18 due to acute pulmonary edema.  Past Medical History  Hypertension Coronary artery disease Suspected diabetes.  Significant Hospital IsEvents   02/04/2019 transfer from [facility] to [facility] required intubation and pressors. 3/16 off Levophed 3/18 extubated briefly but reintubated due to pulmonary edema 3/19 started milrinone 3/20 Episode of respiratory distress,  Precedex changed to propofol Atrial fibrillation RVR >>amiodarone bolus >> sinus rhythm 3/21 rising creat , AF-RVR >> amio bolus 3/24:rising creatinine 3/30: reintubated 3/31 trach 4/1 PICC  Consults:  02/04/2019 ID  IR   Procedures:  ETT 3/10 > 3/18, 3/18 > 3/31 Trach 3/31 > CVL 3/10 > 4/3 PICC 4/1 >   Significant Diagnostic Tests:  Echo 3/15 decreased EF 35 to 40%, LVEDP 27, no significant MR Cardiac cath >> LVEDP 40, RCA totally occluded, patent OM1 stent but distally occluded, apical LAD diffusely diseased, no intervention Chest x-ray 3/22  bilateral lower lobe airspace disease versus layering effusions CXR 3/23> pulmonary edema and bilateral pleural effusions. CXR 4/1> bibasilar opacities, small R pleural effusion, tracheostomy tube present and appropriately positioned   Micro Data:  02/04/2019 blood cultures x2>> Gr A strep 02/04/2019 sputum culture>> neg 02/04/2019 urine culture>>neg 02/04/2019 respiratory virus panel>> rhinovirus 02/04/2019 flu a and B>> neg coronavirus testing 02/04/2019>>neg resp  3/21 >> Klebsiella oxytoca, co-ag negative staph  Antimicrobials:  02/04/2019 vancomycin>>3/ 10 02/04/2019 Zosyn>> 3/10 Pen G 3/10 >> 3/22 (plan) ceftx 3/20 >>3/24 Cipro 3/24>> 3/31  Interim history/subjective:  Failed trach collar yesterday after 30 minutes. Today tolerated SBT for only 10 minutes. RN concerned anxiety is large contributor. Patient confirms this via Nurse, learning disability. WBC up today to 12.9. No fevers.    Objective   Blood pressure (!) 139/56, pulse 95, temperature 98.2 F (36.8 C), temperature source Oral, resp. rate (!) 28, height 5\' 6"  (1.676 m), weight 76.6 kg, SpO2 100 %. CVP:  [6 mmHg] 6 mmHg  Vent Mode: PRVC FiO2 (%):  [30 %-40 %] 40 % Set Rate:  [12 bmp] 12 bmp Vt Set:  [450 mL] 450 mL PEEP:  [5 cmH20] 5 cmH20 Pressure Support:  [10 cmH20] 10 cmH20 Plateau Pressure:  [17 cmH20-24 cmH20] 21 cmH20   Intake/Output Summary (Last 24 hours) at 03/03/2019 0805 Last data filed at 03/03/2019 0600 Gross per 24 hour  Intake 2139.46 ml  Output 2200 ml  Net -60.54 ml   Filed Weights   03/01/19 0500 03/02/19 0500 03/03/19 0500  Weight: 75.9 kg 76.8 kg 76.6 kg   Examination:  General: Adult female in NAD on vent.  Neuro: Awake, communicates via translator. Follows commands.  HEENT: [place]/AT. No JVD. Trach in place.  Cardiovascular: RRR, no MRG.  Lungs: Even, unlabored, vent assisted breaths. Clear.  Abdomen: BS x 4, soft, NT/ND.  Musculoskeletal: No gross deformities, no edema.  Skin: Grossly intact.    Assessment & Plan:   Acute respiratory failure requiring mechanical ventilation - s/p trach 3/31. - Trach collar as tolerated.  - OOB to chair vs chair position today.   Hypernatremia. - Continue free water per tube. - Hold further lasix. 4L negative  for admission.   Ischemic Cardiomyopathy (Echo 3/26 EF 35-40%, LV diffuse hypokinesis) - no targets for revascularization on cath - Cardiology following. - Continue aspirin, Plavix, coreg, hydralazine, imdur - Hold  further diuresis. Felt to be euvolemic.   Atrial Fibrillation with RVR. - Continue amio, coreg.  Diabetes type 2/uncontrolled hyperglycemia. - SSI resistant. - Continue levimir 50 units BID. SSI. May need to add TF coverage.  - Continue tube feeds  Intermittent agitation - exacerbated by language barrier (family video conference has seemed to help).  Wonder how much of this is simply deconditioning.  - Continue clonazepam - Seroquel QHS - PT to work with patient.   GI ppx: Protonix VAP: Continue Pain/Sedation: PRN fentanyl / PRN clonazepam DVT ppx: SCDs / Heparin Diet: Enteral Nutrition Code Status: full Family Communication: none    Joneen Roach, AGACNP-BC [facility] Pulmonary/Critical Care Pager [phone] or [phone]  03/03/2019 8:13 AM  Critical care attending attestation note:  Patient seen and examined and relevant ancillary tests reviewed.  I agree with the assessment and plan of care as outlined by Henreitta Leber, NP. This patient was seen as a shared visit.   Patient is not tolerating tracheostomy trials well.  She is anxious but distress may be real and caused by generalized weakness, carbohydrate overfeeding in context of limited ventilatory reserve (a metabolic cart study would be useful in this case), or cardiac ischemia limited. We can check a BNP to guide diuresis.  Lynnell Catalan, MD Staff Critical Care Physician [facility] Pager: [phone] Off hours: [phone]  03/03/2019, 11:22 AM

## 2019-03-03 NOTE — Plan of Care (Signed)
Elink:  Video phone chat with daughter

## 2019-03-03 NOTE — Progress Notes (Signed)
Physical Therapy Treatment Patient Details Name: Gabrielle Wright MRN: [id number] DOB: [birth year] Today's Date: 03/03/2019    History of Present Illness 73 y.o. female admitted on 02/03/19 for fever and cough (to [facility]) and was transferred to [facility] on 02/04/19 for hypotension and respiratory distress (acute respiratory failure/pulmonary edema) requiring intubation 3/10-current (time of PT eval 02/27/2019).  She was visiting family from [place] and COVID 19 tests were negative.  She was found to have group A strep bacteremia (septic shiock), rhinovirus,  and course complicated by NSTEMI and new LV dysfunction s/p cardiac cath on 02/09/19.  Cardiology following.  Other dx include ischemic cardiomyopathy, hyperkalemia, DM2- uncontrolled, and abdominal distention.  Pt with significant PMH of DM, HTN, anc CAD. Trach placed on 3/31    PT Comments    Patient seen for OOB progression. VSS on vent but continues to require total max to assist for all aspects of mobility.   Follow Up Recommendations  LTACH     Equipment Recommendations  Other (comment)(defer)    Recommendations for Other Services Rehab consult     Precautions / Restrictions Precautions Precautions: Fall;Other (comment) Precaution Comments: trach, vent Restrictions Weight Bearing Restrictions: No    Mobility  Bed Mobility Overal bed mobility: Needs Assistance Bed Mobility: Supine to Sit;Sit to Supine     Supine to sit: +2 for safety/equipment;Total assist     General bed mobility comments: TotalA + 2 for all aspects of bed mobility  Transfers Overall transfer level: Needs assistance   Transfers: Lateral/Scoot Transfers          Lateral/Scoot Transfers: Total assist    Ambulation/Gait                 Stairs             Wheelchair Mobility    Modified Rankin (Stroke Patients Only)       Balance Overall balance assessment: Needs assistance Sitting-balance support: Feet  supported;Bilateral upper extremity supported Sitting balance-Leahy Scale: Poor Sitting balance - Comments: maxA sitting EOB Postural control: Posterior lean                                  Cognition Arousal/Alertness: Lethargic Behavior During Therapy: Flat affect Overall Cognitive Status: Difficult to assess                                 General Comments: Pt following most simple commands      Exercises      General Comments        Pertinent Vitals/Pain Pain Assessment: Faces Faces Pain Scale: No hurt Pain Location: generalized Pain Descriptors / Indicators: Grimacing    Home Living                      Prior Function            PT Goals (current goals can now be found in the care plan section) Acute Rehab PT Goals Patient Stated Goal: unable to state PT Goal Formulation: Patient unable to participate in goal setting Time For Goal Achievement: 03/13/19 Potential to Achieve Goals: Fair Progress towards PT goals: Not progressing toward goals - comment(remains total assist)    Frequency    Min 2X/week      PT Plan Current plan remains appropriate    Co-evaluation  AM-PAC PT "6 Clicks" Mobility   Outcome Measure  Help needed turning from your back to your side while in a flat bed without using bedrails?: Total Help needed moving from lying on your back to sitting on the side of a flat bed without using bedrails?: Total Help needed moving to and from a bed to a chair (including a wheelchair)?: Total Help needed standing up from a chair using your arms (e.g., wheelchair or bedside chair)?: Total Help needed to walk in hospital room?: Total Help needed climbing 3-5 steps with a railing? : Total 6 Click Score: 6    End of Session Equipment Utilized During Treatment: Oxygen Activity Tolerance: Patient limited by fatigue Patient left: in chair;with call bell/phone within reach(lift pad in  place) Nurse Communication: Mobility status PT Visit Diagnosis: Muscle weakness (generalized) (M62.81);Difficulty in walking, not elsewhere classified (R26.2)     Time: 5625-6389 PT Time Calculation (min) (ACUTE ONLY): 23 min  Charges:  $Therapeutic Activity: 23-37 mins                     Charlotte Crumb, PT DPT  Board Certified Neurologic Specialist Acute Rehabilitation Services Pager [phone] Office [phone]    Fabio Asa 03/03/2019, 1:52 PM

## 2019-03-03 NOTE — Progress Notes (Signed)
Progress Note  Gabrielle Wright Name: Gabrielle Wright Date of Encounter: 03/03/2019  Primary Cardiologist: Minus Breeding, MD   Subjective   Breathing is fair   No CP    Inpatient Medications    Scheduled Meds: . amiodarone  200 mg Per Tube Daily  . aspirin  81 mg Oral Daily  . atorvastatin  40 mg Per Tube q1800  . carvedilol  6.25 mg Per NG tube BID WC  . chlorhexidine gluconate (MEDLINE KIT)  15 mL Mouth Rinse BID  . Chlorhexidine Gluconate Cloth  6 each Topical Q0600  . clonazepam  0.5 mg Per Tube TID  . clopidogrel  75 mg Oral Daily  . feeding supplement (PRO-STAT SUGAR FREE 64)  30 mL Per Tube Daily  . fentaNYL (SUBLIMAZE) injection  50 mcg Intravenous Once  . free water  250 mL Per Tube Q6H  . heparin injection (subcutaneous)  5,000 Units Subcutaneous Q8H  . hydrALAZINE  10 mg Oral Q8H  . insulin aspart  0-24 Units Subcutaneous Q4H  . insulin detemir  50 Units Subcutaneous BID  . isosorbide mononitrate  30 mg Oral Daily  . mouth rinse  15 mL Mouth Rinse BID  . multivitamin  15 mL Per Tube Daily  . pantoprazole sodium  40 mg Per Tube QHS  . polyethylene glycol  17 g Oral Daily  . psyllium  1 packet Per Tube Daily  . QUEtiapine  25 mg Oral QHS  . sodium chloride flush  10-40 mL Intracatheter Q12H  . Thrombi-Pad  1 each Topical Once   Continuous Infusions: . sodium chloride 10 mL/hr at 03/03/19 0600  . calcium gluconate Stopped (02/27/19 0656)  . feeding supplement (OSMOLITE 1.2 CAL) 1,000 mL (03/02/19 2241)   PRN Meds: acetaminophen (TYLENOL) oral liquid 160 mg/5 mL, acetaminophen, bisacodyl, dextrose, diphenhydrAMINE, hydrALAZINE, ipratropium-albuterol, lidocaine (PF), ondansetron (ZOFRAN) IV, sennosides, simethicone, sodium chloride flush   Vital Signs    Vitals:   03/03/19 0500 03/03/19 0600 03/03/19 0700 03/03/19 0730  BP: (!) 143/62 125/63 (!) 139/56   Pulse: 92 87 92   Resp: 20 (!) 21 19   Temp:    98.2 F (36.8 C)  TempSrc:    Oral  SpO2: 93% 97% 97%    Weight: 76.6 kg     Height:        Intake/Output Summary (Last 24 hours) at 03/03/2019 0738 Last data filed at 03/03/2019 0600 Gross per 24 hour  Intake 2139.46 ml  Output 2200 ml  Net -60.54 ml    Net neg 4.8 L    Last 3 Weights 03/03/2019 03/02/2019 03/01/2019  Weight (lbs) 168 lb 14 oz 169 lb 5 oz 167 lb 5.3 oz  Weight (kg) 76.6 kg 76.8 kg 75.9 kg      Telemetry     SR   Personally Reviewed  Physical Exam   GEN: Gabrielle Wright in NAD Neck: tracheostomy Cardiac:  RRR without murmur Respiratory: CTA anteriorly; GI: Soft, mildly distended, not tender MS: 1+  edema Neuro: Opens eyes   Labs    Chemistry Recent Labs  Lab 02/26/19 0500  02/28/19 0421 03/01/19 0355 03/03/19 0444  NA 147*   < > 150* 147* 141  K 3.5   < > 3.6 3.6 3.7  CL 104   < > 106 108 103  CO2 33*   < > 32 31 29  GLUCOSE 194*   < > 132* 182* 223*  BUN 91*   < > 78* 63* 50*  CREATININE  1.73*   < > 1.27* 1.12* 1.09*  CALCIUM 8.6*   < > 8.7* 8.3* 7.9*  PROT  --   --  6.3*  --   --   ALBUMIN 2.3*  --  2.4*  --   --   AST  --   --  22  --   --   ALT  --   --  11  --   --   ALKPHOS  --   --  53  --   --   BILITOT  --   --  0.5  --   --   GFRNONAA 29*   < > 42* 49* 51*  GFRAA 34*   < > 49* 57* 59*  ANIONGAP 10   < > 12 8 9    < > = values in this interval not displayed.     Hematology Recent Labs  Lab 02/28/19 0421 03/01/19 0355 03/03/19 0444  WBC 10.5 9.7 12.0*  RBC 3.46* 3.36* 3.31*  HGB 8.6* 8.2* 8.1*  HCT 31.5* 29.9* 29.4*  MCV 91.0 89.0 88.8  MCH 24.9* 24.4* 24.5*  MCHC 27.3* 27.4* 27.6*  RDW 19.3* 18.9* 19.2*  PLT 307 249 210     Radiology    No results found.  Cardiac Studies   Echocardiogram March 2020-  1. The left ventricle has moderately reduced systolic function, with an ejection fraction of 35-40%. The cavity size was normal. Left ventricular diastolic Doppler parameters are consistent with pseudonormalization. Elevated left ventricular end-diastolic pressure The E/e' is 57.  Regional wall motion abnormalities (see coded diagram below).  2. The right ventricle has mildly reduced systolic function. The cavity was normal. There is no increase in right ventricular wall thickness.  3. No hemodynamically significant valvular heart disease.  4. Normal biatrial chamber size.  5. IVC not well visualized.  Cardiac catheterization March 2020-  2nd Mrg-3 lesion is 80% stenosed.  Acute Mrg lesion is 90% stenosed.    Less than 25% left main  Proximal diffuse 30 to 40% narrowing within the previously placed LAD stent.  The entire apical LAD is diffusely diseased with up to 85% stenosis.  The first diagonal contains 85% ostial narrowing.  Circumflex contains diffuse proximal to mid eccentric 50% stenosis the dominant second obtuse marginal contains mid vessel 50% stenosis in the mid to distal portion of this obtuse marginal there is a patent stent with 20% narrowing, and just distal to the stent there is an 80% thrombotic stenosis.  The RCA is dominant containing 50% mid vessel stenosis, 70% distal stenosis, 70% ostial PDA stenosis, and total occlusion of the mid vessel that fills by collaterals from the left ventricular apex.  Left ventriculography is not performed.  Today's echo was reviewed and there is anteroapical severe hypokinesis and estimated ejection fraction of 40%.  LVEDP by Cath Lab hemodynamics is 40 mmHg.  RECOMMENDATIONS:   Acute on chronic combined systolic and diastolic heart failure with extremely elevated LVEDP greater than or equal to 40 mmHg related to global ischemia in the setting of norepinephrine therapy.  Recommend clopidogrel therapy if possible given stents in multiple territories and evidence of active thrombus beyond the stent in the obtuse marginal.  Current anatomy does not suggest that a role for coronary intervention.  35 cc of contrast used.  Given diabetes/shock state/intrinsic kidney disease, a bump in creatinine is still highly  likely.  Kidney function needs to be monitored.  Gabrielle Wright     73 y.o. female  visiting from [place] admitted March 10 with fever, hypotension and found to have group A strep bacteremia requiring intubation.  Also ruled in for non-ST elevation myocardial infarction. Had cath and medical therapy recommended. Blood cultures March 10 showed group A strep.  Respiratory virus panel showed rhinovirus.  Influenza A and B negative.  Coronavirus negative.  Note LV dysfunction possibly secondary to stress cardiomyopathy.  Assessment & Plan    1 paroxysmal atrial fibrillat  Remains in SR  on amiodarone and Coreg; continue subcutaneous heparin for now   May be due to acute illness    2 NSTEMI/coronary artery disease-plan medical therapy with ASA, plavix; add statin later.  3 cardiomyopathy-likely ischemic.  continue hydralazine/nitrates.  Cr very labile   COuld challenge with ARB as outpt if renal function stable   Would not now   Continue  Coreg.  Would give 1 does lasix   Follow I/O and labs     5 pneumonia-management per critical care medicine.  6 ventilator dependent respiratory failure-s/p trach 7 REnal  Cr 1.12   Follow   s  For questions or updates, please contact [place] Please consult [email or web address] for contact info under        Signed, Dorris Carnes, MD  03/03/2019, 7:38 AM

## 2019-03-03 NOTE — Progress Notes (Signed)
Video camera took place with family

## 2019-03-03 NOTE — Progress Notes (Signed)
SLP Cancellation Note  Patient Details Name: Gabrielle Wright MRN: [id number] DOB: 07/29/1946   Cancelled treatment:       Reason Eval/Treat Not Completed: Patient not medically ready for inline PMV per chart review and discussion with RN. Will continue to follow.   [name] Gonzalo Waymire 03/03/2019, 9:28 AM  [name], M.A. CCC-SLP Acute Herbalist [phone] Office [phone]

## 2019-03-03 NOTE — Progress Notes (Signed)
Nutrition Follow-up  DOCUMENTATION CODES:   Not applicable  INTERVENTION:   Change tube feeding: -Glucerna 1.5 four times daily (total volume = 948 ml) via Cortrak -30 ml Prostat once daily  -Free water flushes 250 ml QID per CCM  Provides: 1524 kcals, 93 grams protein, 720 ml free water (1720 ml free water with flushes). Meets 107% calore needs and 100% of protein needs.   NUTRITION DIAGNOSIS:   Inadequate oral intake related to inability to eat as evidenced by NPO status.  Ongoing  GOAL:   Patient will meet greater than or equal to 90% of their needs  Met with TF  MONITOR:   Vent status, TF tolerance, Labs, I & O's  REASON FOR ASSESSMENT:   Consult Enteral/tube feeding initiation and management  ASSESSMENT:   73 yo female with PMH of DM-2, HTN, CAD from [place] who was admitted with fever and respiratory failure requiring intubation. Admission complicated by NSTEMI and new LV dysfunction.   3/18- failed extubation 3/30- extubated  3/31- DNR rescinded, re-intubated, trach  4/3- post pyloric Cortrak placed   RD working remotely.  Pt unable to tolerate SBT, likely related to anxiety per CCM (do not suspect pt is being overfed). Pt tolerating Osmolite 1.2 @ 60 ml/hr + 30 ml Prostat once daily. RD to change tube feeding formula and to bolus feedings to preserve formulas in Producer, television/film/video. RN made aware of plan.   Weight noted to increase from 75.3 kg on 4/3 to 76.6 kg today. Will continue to monitor trends.   Patient is currently requiring ventilator support via trach MV: 9.4 L/min Temp (24hrs), Avg:97.9 F (36.6 C), Min:97.5 F (36.4 C), Max:98.2 F (36.8 C)  I/O: -2,795 ml since 3/23  UOP: 2,200 ml x 24 hrs   Medications reviewed and include: 80 mg lasix once daily, SS novolog, levemir, liquid MVI, miralax Labs reviewed: CBG [id number]  Diet Order:   Diet Order            Diet NPO time specified  Diet effective now              EDUCATION  NEEDS:   No education needs have been identified at this time  Skin:  Skin Assessment: Reviewed RN Assessment  Last BM:  4/6  Height:   Ht Readings from Last 1 Encounters:  02/04/19 5' 6"  (1.676 m)    Weight:   Wt Readings from Last 1 Encounters:  03/03/19 76.6 kg    Ideal Body Weight:  59.1 kg  BMI:  Body mass index is 27.26 kg/m.  Estimated Nutritional Needs:   Kcal:  1420 kcal  Protein:  90-105 grams  Fluid:  >/= 1.4   Mariana Single RD, LDN Clinical Nutrition Pager # [phone]

## 2019-03-04 LAB — BASIC METABOLIC PANEL
Anion gap: 11 (ref 5–15)
BUN: 50 mg/dL — ABNORMAL HIGH (ref 8–23)
CO2: 27 mmol/L (ref 22–32)
Calcium: 7.5 mg/dL — ABNORMAL LOW (ref 8.9–10.3)
Chloride: 105 mmol/L (ref 98–111)
Creatinine, Ser: 1.17 mg/dL — ABNORMAL HIGH (ref 0.44–1.00)
GFR calc Af Amer: 54 mL/min — ABNORMAL LOW (ref >60)
GFR calc non Af Amer: 47 mL/min — ABNORMAL LOW (ref >60)
Glucose, Bld: 130 mg/dL — ABNORMAL HIGH (ref 70–99)
Potassium: 3.4 mmol/L — ABNORMAL LOW (ref 3.5–5.1)
Sodium: 143 mmol/L (ref 135–145)

## 2019-03-04 LAB — GLUCOSE, CAPILLARY
Glucose-Capillary: 78 mg/dL (ref 70–99)
Glucose-Capillary: 60 mg/dL — ABNORMAL LOW (ref 70–99)
Glucose-Capillary: 129 mg/dL — ABNORMAL HIGH (ref 70–99)
Glucose-Capillary: 209 mg/dL — ABNORMAL HIGH (ref 70–99)
Glucose-Capillary: 128 mg/dL — ABNORMAL HIGH (ref 70–99)
Glucose-Capillary: 150 mg/dL — ABNORMAL HIGH (ref 70–99)

## 2019-03-04 LAB — TROPONIN I: Troponin I: 0.11 ng/mL (ref <0.03)

## 2019-03-04 MED ORDER — SENNOSIDES 8.8 MG/5ML PO SYRP: 10.0000 mL | ORAL_SOLUTION | Freq: Every evening | ORAL | Status: DC | PRN

## 2019-03-04 MED ORDER — CLOPIDOGREL BISULFATE 75 MG PO TABS
75.0000 mg | ORAL_TABLET | Freq: Every day | ORAL | Status: DC
  Administered 2019-03-04 – 2019-03-05 (×2): 75 mg
  Filled 2019-03-04: qty 1

## 2019-03-04 MED ORDER — QUETIAPINE FUMARATE 25 MG PO TABS
25.0000 mg | ORAL_TABLET | Freq: Every day | ORAL | Status: DC
  Administered 2019-03-04 – 2019-03-11 (×8): 25 mg
  Filled 2019-03-04 (×8): qty 1

## 2019-03-04 MED ORDER — HYDRALAZINE HCL 10 MG PO TABS
10.0000 mg | ORAL_TABLET | Freq: Three times a day (TID) | ORAL | Status: DC
  Administered 2019-03-04 – 2019-03-07 (×9): 10 mg
  Filled 2019-03-04 (×9): qty 1

## 2019-03-04 MED ORDER — FUROSEMIDE 10 MG/ML IJ SOLN
80.0000 mg | Freq: Once | INTRAMUSCULAR | Status: AC
  Administered 2019-03-04: 80 mg via INTRAVENOUS
  Filled 2019-03-04: qty 8

## 2019-03-04 MED ORDER — POTASSIUM CHLORIDE 20 MEQ/15ML (10%) PO SOLN
40.0000 meq | Freq: Once | ORAL | Status: AC
  Administered 2019-03-04: 40 meq
  Filled 2019-03-04: qty 30

## 2019-03-04 MED ORDER — ISOSORBIDE DINITRATE 10 MG PO TABS
10.0000 mg | ORAL_TABLET | Freq: Three times a day (TID) | ORAL | Status: DC
  Administered 2019-03-04 – 2019-03-21 (×46): 10 mg
  Filled 2019-03-04 (×48): qty 1

## 2019-03-04 MED ORDER — ACETAMINOPHEN 160 MG/5ML PO SOLN
650.0000 mg | Freq: Four times a day (QID) | ORAL | Status: DC | PRN
  Administered 2019-03-04 – 2019-03-21 (×34): 650 mg
  Filled 2019-03-04 (×35): qty 20.3

## 2019-03-04 MED ORDER — DEXTROSE 50 % IV SOLN
12.5000 g | INTRAVENOUS | Status: AC
  Administered 2019-03-04: 12.5 g via INTRAVENOUS

## 2019-03-04 NOTE — Progress Notes (Signed)
Progress Note  Patient Name: Gabrielle Wright Date of Encounter: 03/04/2019  Primary Cardiologist: Minus Breeding, MD   Subjective  Patient sitting up  [place] tired    Inpatient Medications    Scheduled Meds: . amiodarone  200 mg Per Tube Daily  . aspirin  81 mg Oral Daily  . atorvastatin  40 mg Per Tube q1800  . carvedilol  6.25 mg Per NG tube BID WC  . chlorhexidine gluconate (MEDLINE KIT)  15 mL Mouth Rinse BID  . Chlorhexidine Gluconate Cloth  6 each Topical Q0600  . clonazepam  0.5 mg Per Tube TID  . clopidogrel  75 mg Oral Daily  . feeding supplement (GLUCERNA 1.5 CAL)  237 mL Per Tube QID  . feeding supplement (PRO-STAT SUGAR FREE 64)  30 mL Per Tube Daily  . fentaNYL (SUBLIMAZE) injection  50 mcg Intravenous Once  . free water  250 mL Per Tube Q6H  . heparin injection (subcutaneous)  5,000 Units Subcutaneous Q8H  . hydrALAZINE  10 mg Oral Q8H  . insulin aspart  0-24 Units Subcutaneous Q4H  . insulin detemir  50 Units Subcutaneous BID  . mouth rinse  15 mL Mouth Rinse BID  . multivitamin  15 mL Per Tube Daily  . nitroGLYCERIN  0.4 mg Transdermal Daily  . pantoprazole sodium  40 mg Per Tube QHS  . polyethylene glycol  17 g Oral Daily  . psyllium  1 packet Per Tube Daily  . QUEtiapine  25 mg Oral QHS  . sodium chloride flush  10-40 mL Intracatheter Q12H  . Thrombi-Pad  1 each Topical Once   Continuous Infusions: . sodium chloride 10 mL/hr at 03/03/19 1900  . calcium gluconate Stopped (02/27/19 0656)   PRN Meds: acetaminophen (TYLENOL) oral liquid 160 mg/5 mL, acetaminophen, bisacodyl, dextrose, diphenhydrAMINE, hydrALAZINE, ipratropium-albuterol, lidocaine (PF), ondansetron (ZOFRAN) IV, sennosides, simethicone, sodium chloride flush   Vital Signs    Vitals:   03/04/19 0300 03/04/19 0400 03/04/19 0500 03/04/19 0600  BP: (!) 102/54 (!) 130/58 (!) 113/50 (!) 100/47  Pulse: 76 82 77 74  Resp: 20 (!) 23 17 19   Temp:  98.1 F (36.7 C)    TempSrc:  Axillary     SpO2: 99% 100% 100% 98%  Weight:   78.1 kg   Height:        Intake/Output Summary (Last 24 hours) at 03/04/2019 0736 Last data filed at 03/04/2019 0400 Gross per 24 hour  Intake 3958.56 ml  Output 950 ml  Net 3008.56 ml    Net neg 2.8  L  (Questions accuracy   Lost some x 2  )  Wt 169  Was 168    Last 3 Weights 03/04/2019 03/03/2019 03/02/2019  Weight (lbs) 172 lb 2.9 oz 168 lb 14 oz 169 lb 5 oz  Weight (kg) 78.1 kg 76.6 kg 76.8 kg      Telemetry    SR  Personally Reviewed  Physical Exam   GEN: Patient in NAD Neck: tracheostomy Cardiac:  RRR without murmur Respiratory: CTA anteriorly; GI: Soft, mildly distended, not tender MS:  Tr to 1+ edema Neuro: Opens eyes   Labs    Chemistry Recent Labs  Lab 02/26/19 0500  02/28/19 0421 03/01/19 0355 03/03/19 0444  NA 147*   < > 150* 147* 141  K 3.5   < > 3.6 3.6 3.7  CL 104   < > 106 108 103  CO2 33*   < > 32 31  29  GLUCOSE 194*   < > 132* 182* 223*  BUN 91*   < > 78* 63* 50*  CREATININE 1.73*   < > 1.27* 1.12* 1.09*  CALCIUM 8.6*   < > 8.7* 8.3* 7.9*  PROT  --   --  6.3*  --   --   ALBUMIN 2.3*  --  2.4*  --   --   AST  --   --  22  --   --   ALT  --   --  11  --   --   ALKPHOS  --   --  53  --   --   BILITOT  --   --  0.5  --   --   GFRNONAA 29*   < > 42* 49* 51*  GFRAA 34*   < > 49* 57* 59*  ANIONGAP 10   < > 12 8 9    < > = values in this interval not displayed.     Hematology Recent Labs  Lab 02/28/19 0421 03/01/19 0355 03/03/19 0444  WBC 10.5 9.7 12.0*  RBC 3.46* 3.36* 3.31*  HGB 8.6* 8.2* 8.1*  HCT 31.5* 29.9* 29.4*  MCV 91.0 89.0 88.8  MCH 24.9* 24.4* 24.5*  MCHC 27.3* 27.4* 27.6*  RDW 19.3* 18.9* 19.2*  PLT 307 249 210     Radiology    Dg Chest Port 1 View  Result Date: 03/03/2019 CLINICAL DATA:  Chest pain EXAM: PORTABLE CHEST 1 VIEW COMPARISON:  02/27/2019 FINDINGS: Tracheostomy in good position. Feeding tube enters the stomach with the tip not visualized. Left-sided central line removed.  Right-sided PICC tip in the mid right atrium. Mild improvement in diffuse bilateral airspace disease. Small bilateral effusions and bibasilar atelectasis improved. IMPRESSION: Interval improvement in bilateral airspace disease most likely pulmonary edema. Improvement in bibasilar atelectasis and effusion Right arm PICC tip advanced into the right atrium. Electronically Signed   By: Franchot Gallo M.D.   On: 03/03/2019 19:48    Cardiac Studies   Echocardiogram March 2020-  1. The left ventricle has moderately reduced systolic function, with an ejection fraction of 35-40%. The cavity size was normal. Left ventricular diastolic Doppler parameters are consistent with pseudonormalization. Elevated left ventricular end-diastolic pressure The E/e' is 67. Regional wall motion abnormalities (see coded diagram below).  2. The right ventricle has mildly reduced systolic function. The cavity was normal. There is no increase in right ventricular wall thickness.  3. No hemodynamically significant valvular heart disease.  4. Normal biatrial chamber size.  5. IVC not well visualized.  Cardiac catheterization March 2020-  2nd Mrg-3 lesion is 80% stenosed.  Acute Mrg lesion is 90% stenosed.    Less than 25% left main  Proximal diffuse 30 to 40% narrowing within the previously placed LAD stent.  The entire apical LAD is diffusely diseased with up to 85% stenosis.  The first diagonal contains 85% ostial narrowing.  Circumflex contains diffuse proximal to mid eccentric 50% stenosis the dominant second obtuse marginal contains mid vessel 50% stenosis in the mid to distal portion of this obtuse marginal there is a patent stent with 20% narrowing, and just distal to the stent there is an 80% thrombotic stenosis.  The RCA is dominant containing 50% mid vessel stenosis, 70% distal stenosis, 70% ostial PDA stenosis, and total occlusion of the mid vessel that fills by collaterals from the left ventricular apex.  Left  ventriculography is not performed.  Today's echo was reviewed and there  is anteroapical severe hypokinesis and estimated ejection fraction of 40%.  LVEDP by Cath Lab hemodynamics is 40 mmHg.  RECOMMENDATIONS:   Acute on chronic combined systolic and diastolic heart failure with extremely elevated LVEDP greater than or equal to 40 mmHg related to global ischemia in the setting of norepinephrine therapy.  Recommend clopidogrel therapy if possible given stents in multiple territories and evidence of active thrombus beyond the stent in the obtuse marginal.  Current anatomy does not suggest that a role for coronary intervention.  35 cc of contrast used.  Given diabetes/shock state/intrinsic kidney disease, a bump in creatinine is still highly likely.  Kidney function needs to be monitored.  Patient Profile     73 y.o. female visiting from [place] admitted March 10 with fever, hypotension and found to have group A strep bacteremia requiring intubation.  Also ruled in for non-ST elevation myocardial infarction. Had cath and medical therapy recommended. Blood cultures March 10 showed group A strep.  Respiratory virus panel showed rhinovirus.  Influenza A and B negative.  Coronavirus negative.  Note LV dysfunction possibly secondary to stress cardiomyopathy.  Assessment & Plan    1 paroxysmal atrial fibrillat  Remains in SR  on amiodarone and Coreg; continue subcutaneous heparin for now (DVT dose)   May be due to acute illness  Would hold on systemic anticoagulation unless recurs    2 NSTEMI/coronary artery disease-plan medical therapy with ASA, plavix; add statin later.  3 cardiomyopathy-likely ischemic.     continue hydralazine/nitrates.  Cr very labile   COuld challenge with ARB as outpt if renal function stable   Would not now   Continue  Coreg.   Will give another dose of lasix  4  Pulmonary   Trach       7 REnal  Cr 1.09   Follow  With lasix    For questions or updates, please  contact [place] Please consult [email or web address] for contact info under        Signed, Dorris Carnes, MD  03/04/2019, 7:36 AM

## 2019-03-04 NOTE — TOC Progression Note (Signed)
Transition of Care [facility]) - Progression Note    Patient Details  Name: Gabrielle Wright MRN: [id number] Date of Birth: 10-31-1946  Transition of Care [facility]) CM/SW Contact  Colleen Can RN, BSN, NCM-BC, [place] [phone] Phone Number: 03/04/2019, 10:32 AM  Clinical Narrative:    CM consult for LTACH acknowledged x 3. Patient has [place] Medicaid, therefore would not be considered an LTACH candidate; LTACH facilities does not accept [place] Medicaid. Patient may need a Vent SNF or CIR if patient can transition to TC. CM team will continue to follow.    Expected Discharge Plan: IP Rehab Facility Barriers to Discharge: Continued Medical Work up  Expected Discharge Plan and Services Expected Discharge Plan: IP Rehab Facility   Discharge Planning Services: CM Consult                               Social Determinants of Health (SDOH) Interventions    Readmission Risk Interventions No flowsheet data found.

## 2019-03-04 NOTE — Progress Notes (Signed)
1430 e-Link tele visit with family successful.

## 2019-03-04 NOTE — Plan of Care (Signed)
  Problem: Activity: Goal: Ability to tolerate increased activity will improve Outcome: Progressing Note:  Patient up to chair for one hour today.  PT/OT got patient up to chair.  RN transferred via the lift back to bed.   Patient very anxious in the chair and repeatedly requested to get back in the bed.     Problem: Role Relationship: Goal: Method of communication will improve Outcome: Progressing Note:  Patient able to utilize written communication and gestures with the translator to communicate needs to RN.    Problem: Coping: Goal: Level of anxiety will decrease Outcome: Not Progressing Note:  Patient intermittently very anxious.   Patient became more anxious when ventilator switched to trach collar.  Patient repeatedly hit call bell for over an hour during trach collar weaning trial.  RN utilized Archivist and patient wrote that "she felt her heart race and palpitations when on oxygen".   RN provided emotional support and reassurance that patient being monitored and no increase in heart rate or palpitations noted during ventilator weaning process.   Patient repeatedly stated "I want to go home".

## 2019-03-04 NOTE — Progress Notes (Signed)
Lengthy video call with multiple family members from all over the world. Patient waving to the camera and asking for sweet tea. Patient tearful.

## 2019-03-04 NOTE — Progress Notes (Signed)
Physical Therapy Treatment Patient Details Name: Gabrielle Wright MRN: [id number] DOB: 10-Apr-1946 Today's Date: 03/04/2019    History of Present Illness 73 y.o. female admitted on 02/03/19 for fever and cough (to [facility]) and was transferred to [facility] on 02/04/19 for hypotension and respiratory distress (acute respiratory failure/pulmonary edema) requiring intubation 3/10-current (time of PT eval 02/27/2019).  She was visiting family from [place] and COVID 19 tests were negative.  She was found to have group A strep bacteremia (septic shiock), rhinovirus,  and course complicated by NSTEMI and new LV dysfunction s/p cardiac cath on 02/09/19.  Cardiology following.  Other dx include ischemic cardiomyopathy, hyperkalemia, DM2- uncontrolled, and abdominal distention.  Pt with significant PMH of DM, HTN, anc CAD. Trach placed on 3/31    PT Comments    Patient seen for activity progression and continued OOB mobility. Patient tolerated session well but appears to endorse discomfort and headache with light sensitivity during session.  Patient more engaged during mobility transition but continues to require significant assist. Current POC remains appropriate.  Follow Up Recommendations  LTACH(vs vent SNF)     Equipment Recommendations  Other (comment)(defer)    Recommendations for Other Services Rehab consult     Precautions / Restrictions Precautions Precautions: Fall;Other (comment) Precaution Comments: trach, vent Restrictions Weight Bearing Restrictions: No    Mobility  Bed Mobility Overal bed mobility: Needs Assistance Bed Mobility: Supine to Sit     Supine to sit: +2 for safety/equipment;Max assist;+2 for physical assistance     General bed mobility comments: max assist +2, pt initating trunk movement to ascend into sitting today; total assist for LB mgmt and scooting foward   Transfers Overall transfer level: Needs assistance Equipment used: (face ot face) Transfers: Stand  Pivot Transfers;Sit to/from Stand Sit to Stand: Max assist;+2 safety/equipment Stand pivot transfers: Max assist;+2 safety/equipment       General transfer comment: Max assist +2 to transition OOB to recliner. Face to face technique with chuck pad. Patient able to initiate movement to upright and take pivotal steps to chair but remains significantly unsteady and weak. Multi modal cues for hand placement and positioning during power up.  Ambulation/Gait                 Stairs             Wheelchair Mobility    Modified Rankin (Stroke Patients Only)       Balance Overall balance assessment: Needs assistance Sitting-balance support: Feet supported;Bilateral upper extremity supported Sitting balance-Leahy Scale: Poor Sitting balance - Comments: mod- max assist sitting EOB Postural control: Posterior lean   Standing balance-Leahy Scale: Poor Standing balance comment: relaint on external support                            Cognition Arousal/Alertness: Lethargic Behavior During Therapy: Flat affect Overall Cognitive Status: Difficult to assess                                 General Comments: Pt following most simple commands      Exercises Other Exercises Other Exercises: PROM to B UES, engaged in AROM of L UE x 5 reps overhead press but declined to complete with R UE     General Comments General comments (skin integrity, edema, etc.): VSS on vent      Pertinent Vitals/Pain Pain Assessment: Faces Faces  Pain Scale: Hurts little more Pain Location: generalized, headache Pain Descriptors / Indicators: Grimacing Pain Intervention(s): Limited activity within patient's tolerance    Home Living Family/patient expects to be discharged to:: Private residence Living Arrangements: Children;Other relatives Available Help at Discharge: Family;Available 24 hours/day           Additional Comments: unsure, pt is unable to report and  family is not currently present.     Prior Function Level of Independence: Independent          PT Goals (current goals can now be found in the care plan section) Acute Rehab PT Goals Patient Stated Goal: unable to state PT Goal Formulation: Patient unable to participate in goal setting Time For Goal Achievement: 03/13/19 Potential to Achieve Goals: Fair Progress towards PT goals: Progressing toward goals(very modest progression)    Frequency    Min 2X/week      PT Plan Current plan remains appropriate    Co-evaluation PT/OT/SLP Co-Evaluation/Treatment: Yes Reason for Co-Treatment: Complexity of the patient's impairments (multi-system involvement) PT goals addressed during session: Mobility/safety with mobility OT goals addressed during session: ADL's and self-care      AM-PAC PT "6 Clicks" Mobility   Outcome Measure  Help needed turning from your back to your side while in a flat bed without using bedrails?: A Little Help needed moving from lying on your back to sitting on the side of a flat bed without using bedrails?: A Little Help needed moving to and from a bed to a chair (including a wheelchair)?: A Little Help needed standing up from a chair using your arms (e.g., wheelchair or bedside chair)?: A Little Help needed to walk in hospital room?: Total Help needed climbing 3-5 steps with a railing? : Total 6 Click Score: 14    End of Session Equipment Utilized During Treatment: Oxygen Activity Tolerance: Patient limited by fatigue Patient left: in chair;with call bell/phone within reach(lift pad in place) Nurse Communication: Mobility status PT Visit Diagnosis: Muscle weakness (generalized) (M62.81);Difficulty in walking, not elsewhere classified (R26.2)     Time: 2725-3664 PT Time Calculation (min) (ACUTE ONLY): 23 min  Charges:  $Therapeutic Activity: 8-22 mins                     Gabrielle Wright, PT DPT  Board Certified Neurologic Specialist Acute  Rehabilitation Services Pager [phone] Office [phone]    Gabrielle Wright 03/04/2019, 10:37 AM

## 2019-03-04 NOTE — Progress Notes (Signed)
Occupational Therapy Re-eval Patient Details Name: Gabrielle Wright MRN: [id number] DOB: 20-Feb-1946 Today's Date: 03/04/2019    History of present illness 73 y.o. female admitted on 02/03/19 for fever and cough (to [facility]) and was transferred to [facility] on 02/04/19 for hypotension and respiratory distress (acute respiratory failure/pulmonary edema) requiring intubation 3/10-current (time of PT eval 02/27/2019).  She was visiting family from [place] and COVID 19 tests were negative.  She was found to have group A strep bacteremia (septic shiock), rhinovirus,  and course complicated by NSTEMI and new LV dysfunction s/p cardiac cath on 02/09/19.  Cardiology following.  Other dx include ischemic cardiomyopathy, hyperkalemia, DM2- uncontrolled, and abdominal distention.  Pt with significant PMH of DM, HTN, anc CAD. Trach placed on 3/31   OT comments  Patient on vent via trach, able to follow 1 step commands given increased time and multimodal cueing fairly consistently.  She is limited by generalized weakness, lethargy, and decreased activity tolerance.  Pt requires +2 max assist for bed mobility and stand pivot transfer today, although she is initiating movement during all tasks presented. Provided with PROM of B UES, able to engage in AROM of L UE to complete shoulder flexion but declined to complete with R UE due to fatigue.  Care plan updated, dc plan updated.  Will continue to follow.     Follow Up Recommendations  LTACH;Supervision/Assistance - 24 hour    Equipment Recommendations  3 in 1 bedside commode    Recommendations for Other Services      Precautions / Restrictions Precautions Precautions: Fall;Other (comment) Precaution Comments: trach, vent Restrictions Weight Bearing Restrictions: No       Mobility Bed Mobility Overal bed mobility: Needs Assistance Bed Mobility: Supine to Sit     Supine to sit: +2 for safety/equipment;Max assist;+2 for physical assistance      General bed mobility comments: max assist +2, pt initating trunk movement to ascend into sitting today; total assist for LB mgmt and scooting foward   Transfers Overall transfer level: Needs assistance   Transfers: Stand Pivot Transfers;Sit to/from Stand Sit to Stand: Max assist;+2 safety/equipment Stand pivot transfers: Max assist;+2 safety/equipment       General transfer comment: max assist +2 to stand pivot to recliner, pt initiating movements but limited by weakness     Balance Overall balance assessment: Needs assistance Sitting-balance support: Feet supported;Bilateral upper extremity supported Sitting balance-Leahy Scale: Poor Sitting balance - Comments: mod- max assist sitting EOB Postural control: Posterior lean   Standing balance-Leahy Scale: Poor Standing balance comment: relaint on external support                           ADL either performed or assessed with clinical judgement   ADL Overall ADL's : Needs assistance/impaired                                     Functional mobility during ADLs: Maximal assistance;+2 for physical assistance;+2 for safety/equipment(EOB and to chair ) General ADL Comments: pt requires max to total assist for all self care at this time     Vision   Additional Comments: difficult to assess   Perception     Praxis      Cognition Arousal/Alertness: Lethargic Behavior During Therapy: Flat affect Overall Cognitive Status: Difficult to assess  General Comments: Pt following most simple commands        Exercises Exercises: Other exercises Other Exercises Other Exercises: PROM to B UES, engaged in AROM of L UE x 5 reps overhead press but declined to complete with R UE    Shoulder Instructions       General Comments VSS    Pertinent Vitals/ Pain       Pain Assessment: Faces Faces Pain Scale: Hurts little more Pain Location: generalized,  headache Pain Descriptors / Indicators: Grimacing Pain Intervention(s): Limited activity within patient's tolerance  Home Living Family/patient expects to be discharged to:: Private residence Living Arrangements: Children;Other relatives Available Help at Discharge: Family;Available 24 hours/day                             Additional Comments: unsure, pt is unable to report and family is not currently present.       Prior Functioning/Environment Level of Independence: Independent            Frequency  Min 3X/week        Progress Toward Goals  OT Goals(current goals can now be found in the care plan section)     Acute Rehab OT Goals Patient Stated Goal: unable to state Time For Goal Achievement: 03/18/19 Potential to Achieve Goals: Fair  Plan      Co-evaluation    PT/OT/SLP Co-Evaluation/Treatment: Yes Reason for Co-Treatment: Complexity of the patient's impairments (multi-system involvement)   OT goals addressed during session: ADL's and self-care      AM-PAC OT "6 Clicks" Daily Activity     Outcome Measure   Help from another person eating meals?: Total Help from another person taking care of personal grooming?: Total Help from another person toileting, which includes using toliet, bedpan, or urinal?: Total Help from another person bathing (including washing, rinsing, drying)?: Total Help from another person to put on and taking off regular upper body clothing?: Total Help from another person to put on and taking off regular lower body clothing?: Total 6 Click Score: 6    End of Session    OT Visit Diagnosis: Muscle weakness (generalized) (M62.81);Other symptoms and signs involving cognitive function   Activity Tolerance Patient limited by lethargy   Patient Left in chair;with call bell/phone within reach;with chair alarm set   Nurse Communication Mobility status        Time: [phone] OT Time Calculation (min): 22 min  Charges:  OT General Charges $OT Visit: 1 Visit OT Evaluation $OT Re-eval: 1 Re-eval  Chancy Milroy, OT Acute Rehabilitation Services Pager [phone] Office [phone]     Chancy Milroy 03/04/2019, 10:25 AM

## 2019-03-04 NOTE — Progress Notes (Signed)
Hypoglycemic Event  CBG: 60  Treatment: D50 25 mL (12.5 gm)  Symptoms: None  Follow-up CBG: Time: 0843 CBG Result:129  Possible Reasons for Event: Inadequate meal intake  Comments/MD notified:NP notified    Gabrielle Wright M Gabrielle Wright

## 2019-03-04 NOTE — Progress Notes (Signed)
CRITICAL VALUE ALERT  Critical Value: potasium  3.3   Date & Time Notied:  03/04/19 1100  Provider Notified: NP Joneen Roach  Orders Received/Actions taken: potassium chloride per tube

## 2019-03-04 NOTE — Progress Notes (Signed)
Patient taken off vent and placed on 40% trach collar. Tolerating well at this time. RT will continue to monitor.

## 2019-03-04 NOTE — Progress Notes (Signed)
NAME:  Naryiah Torp, MRN:  [id number], DOB:  1946-02-27, LOS: 28 ADMISSION DATE:  02/03/2019, CONSULTATION DATE: 02/04/2019 REFERRING MD: Emergency department physician CHIEF COMPLAINT: Fever respiratory failure  Brief History   73 year old elderly woman visiting from [place], admitted 3/10 with fevers, hypotension, found to have group A strep bacteremia, required mechanical ventilation Course complicated by non-STEMI and new LV dysfunction ? Ischemic vs Takatsubo's Failed extubation 3/18 due to acute pulmonary edema.  Past Medical History  Hypertension Coronary artery disease Suspected diabetes.  Significant Hospital Is Events   02/04/2019 transfer from [facility] to [facility] required intubation and pressors. 3/16 off levophed 3/18 extubated briefly but reintubated due to pulmonary edema 3/19 started milrinone 3/20 Episode of respiratory distress,  Precedex changed to propofol Atrial fibrillation RVR >>amiodarone bolus >> sinus rhythm 3/21 rising creat , AF-RVR >> amio bolus 3/24:rising creatinine 3/30: reintubated 3/31 trach 4/1 PICC  Consults:  02/04/2019 ID  IR   Procedures:  ETT 3/10 > 3/18, 3/18 > 3/31 Trach 3/31 > CVL 3/10 > 4/3 PICC 4/1 >   Significant Diagnostic Tests:  Echo 3/15 decreased EF 35 to 40%, LVEDP 27, no significant MR Cardiac cath >> LVEDP 40, RCA totally occluded, patent OM1 stent but distally occluded, apical LAD diffusely diseased, no intervention Chest x-ray 3/22  bilateral lower lobe airspace disease versus layering effusions CXR 3/23> pulmonary edema and bilateral pleural effusions. CXR 4/1> bibasilar opacities, small R pleural effusion, tracheostomy tube present and appropriately positioned   Micro Data:  02/04/2019 blood cultures x2>> Gr A strep 02/04/2019 sputum culture>> neg 02/04/2019 urine culture>>neg 02/04/2019 respiratory virus panel>> rhinovirus 02/04/2019 flu a and B>> neg coronavirus testing 02/04/2019>>neg resp  3/21 >> Klebsiella oxytoca, co-ag negative staph  Antimicrobials:  02/04/2019 vancomycin>>3/ 10 02/04/2019 Zosyn>> 3/10 Pen G 3/10 >> 3/22 (plan) ceftx 3/20 >>3/24 Cipro 3/24>> 3/31  Interim history/subjective:  No significant weaning yesterday. Chest pain in afternoon, but EKG, Trop, and CXR non-specific.   Objective   Blood pressure (!) 121/58, pulse 72, temperature 98.1 F (36.7 C), temperature source Axillary, resp. rate 19, height 5\' 6"  (1.676 m), weight 78.1 kg, SpO2 96 %. CVP:  [10 mmHg-19 mmHg] 14 mmHg  Vent Mode: PSV;PRVC FiO2 (%):  [30 %-40 %] 30 % Set Rate:  [12 bmp] 12 bmp Vt Set:  [450 mL] 450 mL PEEP:  [5 cmH20] 5 cmH20 Pressure Support:  [10 cmH20] 10 cmH20 Plateau Pressure:  [21 cmH20-24 cmH20] 21 cmH20   Intake/Output Summary (Last 24 hours) at 03/04/2019 0811 Last data filed at 03/04/2019 0400 Gross per 24 hour  Intake 2078.56 ml  Output 950 ml  Net 1128.56 ml   Filed Weights   03/02/19 0500 03/03/19 0500 03/04/19 0500  Weight: 76.8 kg 76.6 kg 78.1 kg   Examination:  General: adult female on vent Neuro: Awake, responds appropriately when spoken to with translator.   HEENT: [place]/AT. No JVD. Trach in place.  Cardiovascular: RRR, no MRG.  Lungs: Clear. Unlabored on PSV  Abdomen: BS x 4, soft, NT/ND.  Musculoskeletal: No gross deformities, no edema.  Skin: Grossly intact.    Assessment & Plan:   Acute respiratory failure requiring mechanical ventilation - s/p trach 3/31. - Weaning on PSV 10/5. Seems to be better today after diuresis yesterday.  - If she continues to successfully wean, want to try trach collar.  - Chair position today.   Hypernatremia. - Continue free water per tube. - Diuresis as tolerated. 80 mg lasix given yesterday.  BMP pending this morning.   Ischemic Cardiomyopathy (Echo 3/26 EF 35-40%, LV diffuse hypokinesis) - no targets for revascularization on cath - Cardiology following. - Continue aspirin, Plavix, coreg, hydralazine, imdur   Atrial Fibrillation with RVR. - Continue amio, coreg.  Diabetes type 2/uncontrolled hyperglycemia. - SSI resistant. - Continue levimir 50 units BID. SSI.  Borderline hypoglycemia. Keep an eye. May need to lower dose as TF carbohydrate concentration has changed.   Intermittent agitation - exacerbated by language barrier (family video conference has seemed to help).  Wonder how much of this is simply deconditioning.  - Continue clonazepam - Seroquel QHS - PT to work with patient.  - Establish good sleep wake cycle.   Dysphagia:  - Continue TF Cortrak. May need PEG. Complaining about NGT.   If no significant weaning progress in the next couple days, may need LTACH.   GI ppx: Protonix VAP: per protocol Pain/Sedation: PRN fentanyl / PRN clonazepam DVT ppx: SCDs / Heparin Diet: Enteral Nutrition Code Status: full Family Communication: Left message for Daughter   Critical care time: 30 mins  Joneen Roach, AGACNP-BC [facility] Pulmonary/Critical Care Pager [phone] or [phone]  03/04/2019 8:11 AM

## 2019-03-05 ENCOUNTER — Inpatient Hospital Stay (HOSPITAL_COMMUNITY): Payer: Medicaid Other

## 2019-03-05 DIAGNOSIS — E1165 Type 2 diabetes mellitus with hyperglycemia: Secondary | ICD-10-CM

## 2019-03-05 DIAGNOSIS — I255 Ischemic cardiomyopathy: Secondary | ICD-10-CM

## 2019-03-05 DIAGNOSIS — I4891 Unspecified atrial fibrillation: Secondary | ICD-10-CM

## 2019-03-05 DIAGNOSIS — E870 Hyperosmolality and hypernatremia: Secondary | ICD-10-CM

## 2019-03-05 DIAGNOSIS — E118 Type 2 diabetes mellitus with unspecified complications: Secondary | ICD-10-CM

## 2019-03-05 LAB — GLUCOSE, CAPILLARY
Glucose-Capillary: 121 mg/dL — ABNORMAL HIGH (ref 70–99)
Glucose-Capillary: 156 mg/dL — ABNORMAL HIGH (ref 70–99)
Glucose-Capillary: 193 mg/dL — ABNORMAL HIGH (ref 70–99)
Glucose-Capillary: 65 mg/dL — ABNORMAL LOW (ref 70–99)
Glucose-Capillary: 67 mg/dL — ABNORMAL LOW (ref 70–99)
Glucose-Capillary: 83 mg/dL (ref 70–99)
Glucose-Capillary: 84 mg/dL (ref 70–99)
Glucose-Capillary: 86 mg/dL (ref 70–99)
Glucose-Capillary: 88 mg/dL (ref 70–99)

## 2019-03-05 MED ORDER — FUROSEMIDE 10 MG/ML IJ SOLN
60.0000 mg | Freq: Once | INTRAMUSCULAR | Status: AC
  Administered 2019-03-05: 60 mg via INTRAVENOUS
  Filled 2019-03-05: qty 6

## 2019-03-05 MED ORDER — FUROSEMIDE 10 MG/ML IJ SOLN: 40.0000 mg | Freq: Every day | INTRAMUSCULAR | Status: DC

## 2019-03-05 MED ORDER — INSULIN DETEMIR 100 UNIT/ML ~~LOC~~ SOLN
50.0000 [IU] | Freq: Every day | SUBCUTANEOUS | Status: DC
  Filled 2019-03-05 (×2): qty 0.5

## 2019-03-05 MED ORDER — ORAL CARE MOUTH RINSE
15.0000 mL | OROMUCOSAL | Status: DC
  Administered 2019-03-05 – 2019-03-21 (×147): 15 mL via OROMUCOSAL

## 2019-03-05 NOTE — Progress Notes (Signed)
Hypoglycemic Event  CBG: 67  Treatment: Dextrose 5% 25 ml   Symptoms: N/A  Follow-up CBG: [id number] CBG Result: 121   Comments/MD notified: Will notify MD during AM rounds     Gabrielle Wright

## 2019-03-05 NOTE — Progress Notes (Signed)
Progress Note  Patient Name: Gabrielle Wright Date of Encounter: 03/05/2019  Primary Cardiologist: Minus Breeding, MD   Subjective  Pt complains of abdominal pain   Cramping   Inpatient Medications    Scheduled Meds:  amiodarone  200 mg Per Tube Daily   aspirin  81 mg Oral Daily   atorvastatin  40 mg Per Tube q1800   carvedilol  6.25 mg Per NG tube BID WC   chlorhexidine gluconate (MEDLINE KIT)  15 mL Mouth Rinse BID   Chlorhexidine Gluconate Cloth  6 each Topical Q0600   clonazepam  0.5 mg Per Tube TID   clopidogrel  75 mg Per Tube Daily   feeding supplement (GLUCERNA 1.5 CAL)  237 mL Per Tube QID   feeding supplement (PRO-STAT SUGAR FREE 64)  30 mL Per Tube Daily   fentaNYL (SUBLIMAZE) injection  50 mcg Intravenous Once   free water  250 mL Per Tube Q6H   heparin injection (subcutaneous)  5,000 Units Subcutaneous Q8H   hydrALAZINE  10 mg Per Tube Q8H   insulin aspart  0-24 Units Subcutaneous Q4H   insulin detemir  50 Units Subcutaneous BID   isosorbide dinitrate  10 mg Per Tube TID   mouth rinse  15 mL Mouth Rinse 10 times per day   multivitamin  15 mL Per Tube Daily   pantoprazole sodium  40 mg Per Tube QHS   polyethylene glycol  17 g Oral Daily   psyllium  1 packet Per Tube Daily   QUEtiapine  25 mg Per Tube QHS   sodium chloride flush  10-40 mL Intracatheter Q12H   Continuous Infusions:  sodium chloride Stopped (03/04/19 0815)   PRN Meds: acetaminophen (TYLENOL) oral liquid 160 mg/5 mL, bisacodyl, dextrose, diphenhydrAMINE, hydrALAZINE, ipratropium-albuterol, lidocaine (PF), ondansetron (ZOFRAN) IV, sennosides, simethicone, sodium chloride flush   Vital Signs    Vitals:   03/05/19 0350 03/05/19 0400 03/05/19 0500 03/05/19 0600  BP: (!) 117/59 124/65 134/67 (!) 111/56  Pulse: 87 84 85 73  Resp: (!) 22 (!) 21 20 18   Temp:  98.4 F (36.9 C)    TempSrc:  Oral    SpO2: 99% 99% 100% 98%  Weight:      Height:        Intake/Output Summary  (Last 24 hours) at 03/05/2019 0650 Last data filed at 03/05/2019 0500 Gross per 24 hour  Intake 2025.14 ml  Output 1600 ml  Net 425.14 ml    Net neg 1.1 L   Prob not accurate     Last 3 Weights 03/05/2019 03/04/2019 03/03/2019  Weight (lbs) 178 lb 5.6 oz 172 lb 2.9 oz 168 lb 14 oz  Weight (kg) 80.9 kg 78.1 kg 76.6 kg      Telemetry    SR  Personally Reviewed  Physical Exam   GEN: Patient in NAD but uncomfortable when push on abd   Neck: tracheostomy Cardiac:  RRR without murmur Respiratory: CTA anteriorly; GI: abd distended Mild diffuse tenderness MS:  Tr edema    Labs    Chemistry Recent Labs  Lab 02/28/19 0421 03/01/19 0355 03/03/19 0444 03/04/19 0837  NA 150* 147* 141 143  K 3.6 3.6 3.7 3.4*  CL 106 108 103 105  CO2 32 31 29 27   GLUCOSE 132* 182* 223* 130*  BUN 78* 63* 50* 50*  CREATININE 1.27* 1.12* 1.09* 1.17*  CALCIUM 8.7* 8.3* 7.9* 7.5*  PROT 6.3*  --   --   --   ALBUMIN 2.4*  --   --   --  AST 22  --   --   --   ALT 11  --   --   --   ALKPHOS 53  --   --   --   BILITOT 0.5  --   --   --   GFRNONAA 42* 49* 51* 47*  GFRAA 49* 57* 59* 54*  ANIONGAP 12 8 9 11      Hematology Recent Labs  Lab 02/28/19 0421 03/01/19 0355 03/03/19 0444  WBC 10.5 9.7 12.0*  RBC 3.46* 3.36* 3.31*  HGB 8.6* 8.2* 8.1*  HCT 31.5* 29.9* 29.4*  MCV 91.0 89.0 88.8  MCH 24.9* 24.4* 24.5*  MCHC 27.3* 27.4* 27.6*  RDW 19.3* 18.9* 19.2*  PLT 307 249 210     Radiology    Dg Chest Port 1 View  Result Date: 03/03/2019 CLINICAL DATA:  Chest pain EXAM: PORTABLE CHEST 1 VIEW COMPARISON:  02/27/2019 FINDINGS: Tracheostomy in good position. Feeding tube enters the stomach with the tip not visualized. Left-sided central line removed. Right-sided PICC tip in the mid right atrium. Mild improvement in diffuse bilateral airspace disease. Small bilateral effusions and bibasilar atelectasis improved. IMPRESSION: Interval improvement in bilateral airspace disease most likely pulmonary edema.  Improvement in bibasilar atelectasis and effusion Right arm PICC tip advanced into the right atrium. Electronically Signed   By: Franchot Gallo M.D.   On: 03/03/2019 19:48    Cardiac Studies   Echocardiogram March 2020-  1. The left ventricle has moderately reduced systolic function, with an ejection fraction of 35-40%. The cavity size was normal. Left ventricular diastolic Doppler parameters are consistent with pseudonormalization. Elevated left ventricular end-diastolic pressure The E/e' is 32. Regional wall motion abnormalities (see coded diagram below).  2. The right ventricle has mildly reduced systolic function. The cavity was normal. There is no increase in right ventricular wall thickness.  3. No hemodynamically significant valvular heart disease.  4. Normal biatrial chamber size.  5. IVC not well visualized.  Cardiac catheterization March 2020-  2nd Mrg-3 lesion is 80% stenosed.  Acute Mrg lesion is 90% stenosed.    Less than 25% left main  Proximal diffuse 30 to 40% narrowing within the previously placed LAD stent.  The entire apical LAD is diffusely diseased with up to 85% stenosis.  The first diagonal contains 85% ostial narrowing.  Circumflex contains diffuse proximal to mid eccentric 50% stenosis the dominant second obtuse marginal contains mid vessel 50% stenosis in the mid to distal portion of this obtuse marginal there is a patent stent with 20% narrowing, and just distal to the stent there is an 80% thrombotic stenosis.  The RCA is dominant containing 50% mid vessel stenosis, 70% distal stenosis, 70% ostial PDA stenosis, and total occlusion of the mid vessel that fills by collaterals from the left ventricular apex.  Left ventriculography is not performed.  Today's echo was reviewed and there is anteroapical severe hypokinesis and estimated ejection fraction of 40%.  LVEDP by Cath Lab hemodynamics is 40 mmHg.  RECOMMENDATIONS:   Acute on chronic combined systolic  and diastolic heart failure with extremely elevated LVEDP greater than or equal to 40 mmHg related to global ischemia in the setting of norepinephrine therapy.  Recommend clopidogrel therapy if possible given stents in multiple territories and evidence of active thrombus beyond the stent in the obtuse marginal.  Current anatomy does not suggest that a role for coronary intervention.  35 cc of contrast used.  Given diabetes/shock state/intrinsic kidney disease, a bump in creatinine is  still highly likely.  Kidney function needs to be monitored.  Patient Profile     73 y.o. female visiting from [place] admitted March 10 with fever, hypotension and found to have group A strep bacteremia requiring intubation.  Also ruled in for non-ST elevation myocardial infarction. Had cath and medical therapy recommended. Blood cultures March 10 showed group A strep.  Respiratory virus panel showed rhinovirus.  Influenza A and B negative.  Coronavirus negative.  Note LV dysfunction possibly secondary to stress cardiomyopathy.  Assessment & Plan    1 paroxysmal atrial fibrillat  Remains in SR  on amiodarone and Coreg; continue subcutaneous heparin for now (DVT dose)   May be due to acute illness  Would hold on systemic anticoagulation unless recurs    2 NSTEMI/coronary artery disease-plan medical therapy with ASA, plavix; add statin later.  3 cardiomyopathy-likely ischemic.     continue hydralazine/nitrates and coreg    4  GI   WIll get KUB   Place NG    Appears distended    ? If swallowing air while weaning   Having 1-2 BM per day      4  Pulmonary   Trach        For questions or updates, please contact [place] Please consult [email or web address] for contact info under        Signed, Dorris Carnes, MD  03/05/2019, 6:50 AM

## 2019-03-05 NOTE — Clinical Social Work Note (Signed)
Pt is from [place] however in our system it says she has [place] Medicaid. CSW has reached out to financial counselor because this will directly affect placement--awaiting an update.  [place], [place] [phone]

## 2019-03-05 NOTE — TOC Progression Note (Addendum)
Transition of Care [facility]) - Progression Note    Patient Details  Name: Nechole Lison MRN: [id number] Date of Birth: [birth year]  [facility]) CM/SW Contact  Colleen Can RN, BSN, NCM-BC, ACM-RN [phone] (working remotely)  Phone Number: 03/05/2019, 1:02 PM  Clinical Narrative:    CM informed patient is not a [place] Citizen, therefore does not have [place] Medicaid; the insurance payor was listed in error. CM spoke to [employer], [facility] liaison to discuss LTACH options. Corrina communicated since the patient is uninsured, not a [place] Citizen with a discharge disposition, the patient would not be an LTACH candidate. CM team will continue to follow.   Expected Discharge Plan: Home w Home Health Services   Barriers to Discharge: Inadequate or no insurance, Continued Medical Work up; Not a [place] Citizen visiting from [place]; Requiring a vent/trach SNF  Addendum: 03/06/19 @ 1048-Gladstone Rosas RNCM-The financial counselor has confirmed with Clarisse Gouge SW- patient does has [place] Medicaid.    Expected Discharge Plan and Services Expected Discharge Plan: Home w Home Health Services   Discharge Planning Services: CM Consult   Social Determinants of Health (SDOH) Interventions    Readmission Risk Interventions No flowsheet data found.

## 2019-03-05 NOTE — Progress Notes (Signed)
Patient laid flat to be cleaned up from incontinence episode. Patient showed signs of increased work of breathing, became more lethargic with eyes rolling to the back of her head. Patient abdomen remains largely distended. Vital signs remained stable. CBG rechecked due to frequent hypoglycemic events and resulted at 84. MD notified. No new orders at this time. Will continue to monitor closely.    Tenna Child RN

## 2019-03-05 NOTE — Progress Notes (Signed)
Pt Placed back on full vent support due to increased WOB and RR.  RN at bedside.

## 2019-03-05 NOTE — Progress Notes (Signed)
Pt transported on vent from room 2H22 to 2H03 without complications.

## 2019-03-05 NOTE — Progress Notes (Signed)
SLP Cancellation Note  Patient Details Name: Gabrielle Wright MRN: [id number] DOB: 24-Aug-1946   Cancelled treatment:       Reason Eval/Treat Not Completed: Patient not medically ready. Per RN and chart review she spent about two hours on TC yesterday but was not able to wean to [facility] well this morning. She may attempt TC again later today, but per RN, would hold PMV eval. SLP will continue to follow.   Gabrielle Wright 03/05/2019, 10:44 AM  Gabrielle Wright, M.A. CCC-SLP Acute Herbalist [phone] Office [phone]

## 2019-03-05 NOTE — Progress Notes (Signed)
Hypoglycemic Event  CBG: 65  Treatment: PRN dextrose 12.5 g   Symptoms: none observed by rn; patient alert and communicating as per usual for language barrier  Follow-up CBG: Time: 0459 CBG Result:156  Possible Reasons for Event: unsure     Gabrielle Wright

## 2019-03-05 NOTE — Progress Notes (Signed)
PROGRESS NOTE    Gabrielle Wright  SXJ:[id number] DOB: 1946-03-13 DOA: 02/03/2019 PCP: Rudene Anda, MD   Brief Narrative:  73 year old female PMHx from [place]. HTN , CAD, DM Type 2?  Noted to have a high fever 103.9. Reported to have cough. Taken to [place] center per family. Was supported to have a urinary tract infection although her urine was clean. She never filled her prescription for antibiotics. Due to persistent hypotension and refractory nature of her hypertension she is transferred to [facility] 02/04/2019. She was in respiratory distress for abdominal chest wall paradoxus. She required intubation and central line placement. She had maxed out on peripheral IV vasopressors therefore central line was placed. Pulmonary critical care will be responsible for care at this time. She was placed in isolation since she came from a [place] entry of [place] with recent reports of coronavirus and that country. Note [place] is not on the list of infected countries. Infectious disease was called and they reported again diagnosis done on 1 reported countries.     Subjective: 4/8 eyes open does not respond to commands does not withdraw to painful stimuli.   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 - Patient currently with pulmonary edema not standing attempts at vent  weaning. - See ischemic cardiomyopathy  Ischemic cardiomyopathy - 2/26 echocardiogram EF 35 to 40%, LV diffuse hypokinesis, no targets for revascularization on catheterization - Cardiology following - 4/8 Lasix 60 mg x 1 - Lasix 40 mg daily - CVP 13 -  4/9 PCXR pending -Strict in and out -Daily weight -Amiodarone 200 mg daily -Coreg 6.25 mg BID -Plavix 75 mg daily -Lasix 40 mg daily -Hydralazine PRN -Hydralazine 10 mg 3 times daily -Isosorbide dinitrate 10 mg 3 times daily -Transfuse for hemoglobin<8  A. fib with RVR - See ischemic cardiomyopathy  Hypernatremia - Resolved -Free water 245m QID  Diabetes type 2 uncontrolled with complication - 33/36hemoglobin A1c= 10.1 - Levemir 50 units nightly - Custom SSI   Intermittent agitation -Exacerbated by language barrier.  National language? - Clonazepam 0.5 mg 3 times daily - Seroquel 25 mg nightly -During the day all shades and lights on   Dysphasia - Continue feedings per core track currently on hold secondary to abdominal pain -   Dysphagia:  - Continue TF Cortrak. May need PEG.  -Although patient's acute abdominal series does not show ileus/SBO patient has signs and symptoms consistent NG tube to low wall suction patient much more comfortable.       DVT prophylaxis: Subcu heparin Code Status: Full Family Communication: None Disposition Plan: TBD   Consultants:  Cardiology    Procedures/Significant Events:  4/8 acute abdominal series: Negative ileus or SBO   I have personally reviewed and interpreted all radiology studies and my findings are as above.  VENTILATOR SETTINGS: Mode: PRVC Vt set: 450 ml Set rate: 12 FiO2: 30 PEEP: 5  Cultures   Antimicrobials: Anti-infectives (From admission, onward)   Start     Stop   02/19/19 1200  ciprofloxacin (CIPRO) IVPB 400 mg     02/25/19 1321   02/18/19 1230  ciprofloxacin (CIPRO) IVPB 400 mg  Status:  Discontinued     02/19/19 1119   02/14/19  0900  cefTRIAXone (ROCEPHIN) 2 g in sodium chloride 0.9 % 100 mL IVPB     02/16/19 1033   02/06/19 1900  penicillin G potassium 8 Million Units in dextrose 5 % 500 mL continuous infusion  Status:  Discontinued     02/14/19 0846   02/06/19 1100  penicillin G potassium 8 Million Units in dextrose 5 % 500 mL continuous infusion  Status:  Discontinued     02/06/19 1900   02/05/19 2300  penicillin G potassium 4 Million Units in dextrose 5 % 250 mL IVPB  Status:  Discontinued     02/04/19 2237   02/04/19 2300  vancomycin (VANCOCIN) 1,500 mg in sodium chloride 0.9 % 500 mL IVPB     02/05/19 0308   02/04/19 2300  penicillin G potassium 4 Million Units in dextrose 5 % 250 mL IVPB  Status:  Discontinued     02/06/19 1008   02/04/19 2230  clindamycin (CLEOCIN) IVPB 600 mg  Status:  Discontinued     02/05/19 0917   02/04/19 0315  piperacillin-tazobactam (ZOSYN) IVPB 3.375 g     02/04/19 0408   02/04/19 0315  vancomycin (VANCOCIN) IVPB 1000 mg/200 mL premix     02/04/19 0424       Devices    LINES / TUBES:  6 mm cuffed trach 3/31>>    Continuous Infusions: . sodium chloride Stopped (03/04/19 0815)     Objective: Vitals:   03/05/19 0700 03/05/19 0753 03/05/19 0754 03/05/19 0821  BP: (!) 117/57 (!) 117/57 133/70   Pulse: 78 79 86   Resp: 20 (!) 23    Temp:    98.2 F (36.8 C)  TempSrc:    Oral  SpO2: 98% 98%    Weight:      Height:        Intake/Output Summary (Last 24 hours) at 03/05/2019 4166 Last data filed at 03/05/2019 0500 Gross per 24 hour  Intake 1985.51 ml  Output 1600 ml  Net 385.51 ml   Filed Weights   03/03/19 0500 03/04/19 0500 03/05/19 0200  Weight: 76.6 kg 78.1 kg 80.9 kg    Examination:  General: Eyes open, does not respond to verbal commands, grimaces to painful stimuli positive acute respiratory distress Eyes: negative scleral hemorrhage, negative anisocoria, negative icterus ENT: Negative Runny nose, negative gingival bleeding, Neck:  Negative scars,  masses, torticollis, lymphadenopathy, JVD, 6 mm cuffed trach, negative sign of infection or bleeding Lungs: Diffuse rhonchi, negative wheeze, negative crackle, tachypneic Cardiovascular: Tachycardic, without murmur gallop or rub normal S1 and S2 Abdomen: negative abdominal pain, positive distention, firm, hypoactive bowel sounds, no rebound, no ascites, no appreciable mass Extremities: No significant cyanosis, clubbing, or edema bilateral lower extremities Skin: Negative rashes, lesions, ulcers Psychiatric: Unable to evaluate secondary to patient's being on a vent  Central nervous system: Unable to evaluate secondary to patient being on vent  .     Data Reviewed: Care during the described time interval was provided by me .  I have reviewed this patient's available data, including medical history, events of note, physical examination, and all test results as part of my evaluation.   CBC: Recent Labs  Lab 02/27/19 0355 02/28/19 0421 03/01/19 0355 03/03/19 0444  WBC 9.7 10.5 9.7 12.0*  NEUTROABS  --   --   --  8.9*  HGB 7.9* 8.6* 8.2* 8.1*  HCT 28.6* 31.5* 29.9* 29.4*  MCV 90.2 91.0 89.0 88.8  PLT 297 307 249 595   Basic Metabolic Panel: Recent Labs  Lab 02/27/19 0355 02/27/19 1402 02/28/19 0421 03/01/19 0355 03/03/19 0444 03/04/19 0837  NA 151* 146* 150* 147* 141 143  K 2.4* 4.4 3.6 3.6 3.7 3.4*  CL 121* 106 106 108 103 105  CO2 24 34* 32 31 29 27   GLUCOSE 173* 221* 132* 182* 223* 130*  BUN 65* 83* 78* 63* 50* 50*  CREATININE 0.98 1.30* 1.27* 1.12* 1.09* 1.17*  CALCIUM 6.0* 8.6* 8.7* 8.3* 7.9* 7.5*  MG 2.2  --  2.7* 2.7*  --   --   PHOS 2.7  --  3.6 3.6  --   --    GFR: Estimated Creatinine Clearance: 46.6 mL/min (A) (by C-G formula based on SCr of 1.17 mg/dL (H)). Liver Function Tests: Recent Labs  Lab 02/28/19 0421  AST 22  ALT 11  ALKPHOS 53  BILITOT 0.5  PROT 6.3*  ALBUMIN 2.4*   No results for input(s): LIPASE, AMYLASE in the last 168 hours. No  results for input(s): AMMONIA in the last 168 hours. Coagulation Profile: No results for input(s): INR, PROTIME in the last 168 hours. Cardiac Enzymes: Recent Labs  Lab 03/03/19 1521 03/03/19 2254 03/04/19 0452  TROPONINI 0.11* 0.12* 0.11*   BNP (last 3 results) No results for input(s): PROBNP in the last 8760 hours. HbA1C: No results for input(s): HGBA1C in the last 72 hours. CBG: Recent Labs  Lab 03/04/19 1549 03/04/19 1956 03/05/19 0025 03/05/19 0414 03/05/19 0459  GLUCAP 128* 150* 193* 65* 156*   Lipid Profile: No results for input(s): CHOL, HDL, LDLCALC, TRIG, CHOLHDL, LDLDIRECT in the last 72 hours. Thyroid Function Tests: No results for input(s): TSH, T4TOTAL, FREET4, T3FREE, THYROIDAB in the last 72 hours. Anemia Panel: No results for input(s): VITAMINB12, FOLATE, FERRITIN, TIBC, IRON, RETICCTPCT in the last 72 hours. Urine analysis:    Component Value Date/Time   COLORURINE YELLOW 02/04/2019 0123   APPEARANCEUR CLEAR 02/04/2019 0123   LABSPEC 1.015 02/04/2019 0123   PHURINE 5.5 02/04/2019 0123   GLUCOSEU NEGATIVE 02/04/2019 0123   HGBUR NEGATIVE 02/04/2019 0123   BILIRUBINUR NEGATIVE 02/04/2019 0123   KETONESUR NEGATIVE 02/04/2019 0123   PROTEINUR NEGATIVE 02/04/2019 0123   NITRITE NEGATIVE 02/04/2019 0123   LEUKOCYTESUR NEGATIVE 02/04/2019 0123   Sepsis Labs: @LABRCNTIP (procalcitonin:4,lacticidven:4)  )No results found for this or any previous visit (from the past 240 hour(s)).       Radiology Studies: Dg Chest Port 1 View  Result Date: 03/03/2019 CLINICAL DATA:  Chest pain EXAM: PORTABLE CHEST 1 VIEW COMPARISON:  02/27/2019 FINDINGS: Tracheostomy in good position. Feeding tube enters the stomach with the tip not visualized. Left-sided central line removed. Right-sided PICC tip in the mid right atrium. Mild improvement in diffuse bilateral airspace disease. Small bilateral effusions and bibasilar atelectasis improved. IMPRESSION: Interval improvement  in bilateral airspace disease most likely pulmonary edema. Improvement in bibasilar atelectasis and effusion Right arm PICC tip advanced into the right atrium. Electronically Signed   By: Franchot Gallo M.D.   On: 03/03/2019 19:48        Scheduled Meds: . amiodarone  200 mg Per Tube Daily  . aspirin  81 mg Oral Daily  . atorvastatin  40 mg Per Tube q1800  . carvedilol  6.25 mg Per NG tube BID WC  . chlorhexidine gluconate (MEDLINE KIT)  15 mL Mouth Rinse BID  . Chlorhexidine Gluconate Cloth  6 each Topical Q0600  . clonazepam  0.5 mg Per Tube TID  . clopidogrel  75 mg Per Tube Daily  . feeding supplement (GLUCERNA 1.5 CAL)  237 mL Per Tube QID  . feeding supplement (PRO-STAT SUGAR FREE 64)  30 mL Per Tube Daily  . fentaNYL (SUBLIMAZE) injection  50 mcg Intravenous Once  . free water  250 mL Per Tube Q6H  . heparin injection (subcutaneous)  5,000 Units Subcutaneous Q8H  . hydrALAZINE  10 mg Per Tube Q8H  . insulin aspart  0-24 Units Subcutaneous Q4H  . insulin detemir  50 Units Subcutaneous BID  . isosorbide dinitrate  10 mg Per Tube TID  . mouth rinse  15 mL Mouth Rinse 10 times per day  . multivitamin  15 mL Per Tube Daily  . pantoprazole sodium  40 mg Per Tube QHS  . polyethylene glycol  17 g Oral Daily  . psyllium  1 packet Per Tube Daily  . QUEtiapine  25 mg Per Tube QHS  . sodium chloride flush  10-40 mL Intracatheter Q12H   Continuous Infusions: . sodium chloride Stopped (03/04/19 0815)     LOS: 29 days   The patient is critically ill with multiple organ systems failure and requires high complexity decision making for assessment and support, frequent evaluation and titration of therapies, application of advanced monitoring technologies and extensive interpretation of multiple databases. Critical Care Time devoted to patient care services described in this note  Time spent: 40 minutes     Viviane Semidey, Geraldo Docker, MD [facility] Pager [phone]   If 7PM-7AM,  please contact night-coverage [email or web address] Password [facility] 03/05/2019, 8:23 AM

## 2019-03-06 ENCOUNTER — Inpatient Hospital Stay (HOSPITAL_COMMUNITY): Payer: Medicaid Other

## 2019-03-06 DIAGNOSIS — I428 Other cardiomyopathies: Secondary | ICD-10-CM

## 2019-03-06 DIAGNOSIS — R1013 Epigastric pain: Secondary | ICD-10-CM

## 2019-03-06 LAB — BASIC METABOLIC PANEL
Anion gap: 11 (ref 5–15)
BUN: 44 mg/dL — ABNORMAL HIGH (ref 8–23)
CO2: 28 mmol/L (ref 22–32)
Calcium: 7.9 mg/dL — ABNORMAL LOW (ref 8.9–10.3)
Chloride: 101 mmol/L (ref 98–111)
Creatinine, Ser: 1.1 mg/dL — ABNORMAL HIGH (ref 0.44–1.00)
GFR calc Af Amer: 58 mL/min — ABNORMAL LOW (ref >60)
GFR calc non Af Amer: 50 mL/min — ABNORMAL LOW (ref >60)
Glucose, Bld: 127 mg/dL — ABNORMAL HIGH (ref 70–99)
Potassium: 3.8 mmol/L (ref 3.5–5.1)
Sodium: 140 mmol/L (ref 135–145)

## 2019-03-06 LAB — CBC WITH DIFFERENTIAL/PLATELET
Abs Immature Granulocytes: 0.05 10*3/uL (ref 0.00–0.07)
Basophils Absolute: 0 10*3/uL (ref 0.0–0.1)
Basophils Relative: 0 %
Eosinophils Absolute: 0.3 10*3/uL (ref 0.0–0.5)
Eosinophils Relative: 3 %
HCT: 26.5 % — ABNORMAL LOW (ref 36.0–46.0)
Hemoglobin: 7.3 g/dL — ABNORMAL LOW (ref 12.0–15.0)
Immature Granulocytes: 1 %
Lymphocytes Relative: 17 %
Lymphs Abs: 1.7 10*3/uL (ref 0.7–4.0)
MCH: 24.3 pg — ABNORMAL LOW (ref 26.0–34.0)
MCHC: 27.5 g/dL — ABNORMAL LOW (ref 30.0–36.0)
MCV: 88.3 fL (ref 80.0–100.0)
Monocytes Absolute: 0.7 10*3/uL (ref 0.1–1.0)
Monocytes Relative: 7 %
Neutro Abs: 7.4 10*3/uL (ref 1.7–7.7)
Neutrophils Relative %: 72 %
Platelets: 211 10*3/uL (ref 150–400)
RBC: 3 MIL/uL — ABNORMAL LOW (ref 3.87–5.11)
RDW: 19.7 % — ABNORMAL HIGH (ref 11.5–15.5)
WBC: 10.3 10*3/uL (ref 4.0–10.5)
nRBC: 0 % (ref 0.0–0.2)

## 2019-03-06 LAB — MAGNESIUM: Magnesium: 2.8 mg/dL — ABNORMAL HIGH (ref 1.7–2.4)

## 2019-03-06 LAB — GLUCOSE, CAPILLARY
Glucose-Capillary: 114 mg/dL — ABNORMAL HIGH (ref 70–99)
Glucose-Capillary: 118 mg/dL — ABNORMAL HIGH (ref 70–99)
Glucose-Capillary: 134 mg/dL — ABNORMAL HIGH (ref 70–99)
Glucose-Capillary: 144 mg/dL — ABNORMAL HIGH (ref 70–99)
Glucose-Capillary: 145 mg/dL — ABNORMAL HIGH (ref 70–99)
Glucose-Capillary: 219 mg/dL — ABNORMAL HIGH (ref 70–99)
Glucose-Capillary: 87 mg/dL (ref 70–99)

## 2019-03-06 LAB — PREPARE RBC (CROSSMATCH)

## 2019-03-06 MED ORDER — POTASSIUM CHLORIDE 20 MEQ/15ML (10%) PO SOLN
40.0000 meq | Freq: Three times a day (TID) | ORAL | Status: AC
  Administered 2019-03-06 (×2): 40 meq
  Filled 2019-03-06 (×2): qty 30

## 2019-03-06 MED ORDER — SODIUM CHLORIDE 0.9% IV SOLUTION: Freq: Once | INTRAVENOUS | Status: DC

## 2019-03-06 MED ORDER — FUROSEMIDE 10 MG/ML IJ SOLN: 40.0000 mg | Freq: Two times a day (BID) | INTRAMUSCULAR | Status: DC

## 2019-03-06 MED ORDER — METOLAZONE 5 MG PO TABS
5.0000 mg | ORAL_TABLET | Freq: Every day | ORAL | Status: AC
  Administered 2019-03-06: 5 mg via ORAL
  Filled 2019-03-06: qty 1

## 2019-03-06 MED ORDER — FUROSEMIDE 10 MG/ML IJ SOLN
80.0000 mg | Freq: Four times a day (QID) | INTRAMUSCULAR | Status: AC
  Administered 2019-03-06 (×3): 80 mg via INTRAVENOUS
  Filled 2019-03-06 (×3): qty 8

## 2019-03-06 MED ORDER — SODIUM CHLORIDE 0.9% IV SOLUTION
Freq: Once | INTRAVENOUS | Status: AC
  Administered 2019-03-06: 12:00:00 via INTRAVENOUS

## 2019-03-06 MED ORDER — PANTOPRAZOLE SODIUM 40 MG IV SOLR
40.0000 mg | Freq: Two times a day (BID) | INTRAVENOUS | Status: DC
  Administered 2019-03-06 – 2019-03-20 (×29): 40 mg via INTRAVENOUS
  Filled 2019-03-06 (×30): qty 40

## 2019-03-06 NOTE — Progress Notes (Signed)
Nutrition Follow-up  DOCUMENTATION CODES:   Not applicable  INTERVENTION:   Continue bolus tube feeding: -Glucerna 1.5 four times daily (total volume = 948 ml) via Cortrak -30 ml Prostat once daily  -Free water flushes 250 ml QID per CCM  Provides: 1524 kcals, 93 grams protein, 720 ml free water (1720 ml free water with flushes). Meets 107% calore needs and 100% of protein needs.   NUTRITION DIAGNOSIS:   Inadequate oral intake related to inability to eat as evidenced by NPO status.  Ongoing  GOAL:   Patient will meet greater than or equal to 90% of their needs  Addressed via TF  MONITOR:   Vent status, TF tolerance, Labs, I & O's  REASON FOR ASSESSMENT:   Consult Enteral/tube feeding initiation and management  ASSESSMENT:   73 yo female with PMH of DM-2, HTN, CAD from [place] who was admitted with fever and respiratory failure requiring intubation. Admission complicated by NSTEMI and new LV dysfunction.   3/18- failed extubation 3/30- extubated  3/31- DNR rescinded, re-intubated, trach  4/3- post pyloric Cortrak placed 4/8- pt vomiting, NGT placed to suction, TF stopped  RD working remotely.  Pt continues to require vent. Unable to tolerate SBT.   Spoke with RN via phone. Pt remains with NGT to suction, output looks to be dark brown color. Plan stop suction and resume bolus feedings via Cortrak this am. Will monitor for toleration. Plan for pt to d/c to LTAC, likely will get PEG in the near future.   Weight noted to increase from 76.6 kg on 4/6 to 81.3 kg today. Pt continues with lasix therapy.   Patient is currently requiring ventilator support via trach MV: 5.3 L/min Temp (24hrs), Avg:98.6 F (37 C), Min:97.9 F (36.6 C), Max:99.3 F (37.4 C)  I/O: -1,669 ml since admit UOP: 800 ml x 24 hrs  NGT: 1125 ml x 24 hrs   Medications reviewed and include: SS novolog, Levemir, liquid MVI, miralax, metamucil once daily, 80 mg lasix TID Labs reviewed:  Mg 2.8 (H) CBG 83-118  Diet Order:   Diet Order            Diet NPO time specified  Diet effective now              EDUCATION NEEDS:   No education needs have been identified at this time  Skin:  Skin Assessment: Reviewed RN Assessment  Last BM:  4/8- liquid   Height:   Ht Readings from Last 1 Encounters:  02/04/19 5\' 6"  (1.676 m)    Weight:   Wt Readings from Last 1 Encounters:  03/06/19 81.3 kg    Ideal Body Weight:  59.1 kg  BMI:  Body mass index is 28.93 kg/m.  Estimated Nutritional Needs:   Kcal:  1420 kcal  Protein:  90-105 grams  Fluid:  >/= 1.4   Vanessa Kick RD, LDN Clinical Nutrition Pager # - [phone]

## 2019-03-06 NOTE — NC FL2 (Signed)
[place] MEDICAID FL2 LEVEL OF CARE SCREENING TOOL     IDENTIFICATION  Patient Name: Gabrielle Wright Birthdate: May 13, 1946 Sex: female Admission Date (Current Location): 02/03/2019  [place] and [place] Number:  Kathleen Argue [id number] [place] and Address:  [address] [facility] [place] [address], [place], [place] [address]      Provider Number: [id number]  Attending Physician Name and Address:  Allie Bossier, MD  Relative Name and Phone Number:  Lottie Dawson (daughter) [phone]    Current Level of Care: Hospital Recommended Level of Care: Vent SNF Prior Approval Number:    Date Approved/Denied: 03/06/19 PASRR Number: [id number]  Discharge Plan: SNF    Current Diagnoses: Patient Active Problem List   Diagnosis Date Noted  . Tracheostomy status ([place])   . HCAP (healthcare-associated pneumonia)   . Acute pulmonary edema (HCC)   . Abdominal distension (gaseous)   . Acute on chronic respiratory failure with hypoxia ([place])   . AKI (acute kidney injury) ([place])   . Septic shock ([place])   . CAD (coronary artery disease), native coronary artery 02/09/2019  . 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 Sepsis ([place]) 02/04/2019  . Hypotension 02/04/2019  . Acute respiratory failure with hypoxemia (HCC)     Orientation RESPIRATION BLADDER Height & Weight     (unable to assess, pt on ventilator)  Vent(Fi02 30%) Incontinent([place] in place) Weight: 179 lb 3.7 oz (81.3 kg) Height:  5' 6"  (167.6 cm)  BEHAVIORAL SYMPTOMS/MOOD NEUROLOGICAL BOWEL NUTRITION STATUS      Incontinent Feeding tube(Glucerna 1.5 four times daily (total volume = 948 ml) via Cortrak. )  AMBULATORY STATUS COMMUNICATION OF NEEDS Skin   Extensive Assist Does not communicate Normal                       Personal Care Assistance Level of Assistance  Feeding, Bathing, Dressing Bathing Assistance: Maximum assistance Feeding assistance:  Maximum assistance Dressing Assistance: Maximum assistance     Functional Limitations Info  Sight, Hearing, Speech Sight Info: Adequate Hearing Info: Adequate Speech Info: Impaired(pt on ventilator)    SPECIAL CARE FACTORS FREQUENCY  OT (By licensed OT), PT (By licensed PT)     PT Frequency: 2x OT Frequency: 2x            Contractures Contractures Info: Not present    Additional Factors Info  Code Status, Allergies Code Status Info: Full Code Allergies Info: NO known allergies           Current Medications (03/06/2019):  This is the current hospital active medication list Current Facility-Administered Medications  Medication Dose Route Frequency Provider Last Rate Last Dose  . 0.9 %  sodium chloride infusion (Manually program via Guardrails IV Fluids)   Intravenous Once Allie Bossier, MD      . 0.9 %  sodium chloride infusion  250 mL Intravenous Continuous Belva Crome, MD   Stopped at 03/04/19 0815  . acetaminophen (TYLENOL) solution 650 mg  650 mg Per Tube Q6H PRN Chesley Mires, MD   650 mg at 03/05/19 1711  . amiodarone (PACERONE) tablet 200 mg  200 mg Per Tube Daily Lelon Perla, MD   200 mg at 03/06/19 0935  . atorvastatin (LIPITOR) tablet 40 mg  40 mg Per Tube q1800 Sande Rives E, PA-C   40 mg at 03/05/19 1700  . bisacodyl (DULCOLAX) suppository 10 mg  10 mg Rectal Daily PRN  Bowser, Laurel Dimmer, NP      . carvedilol (COREG) tablet 6.25 mg  6.25 mg Per NG tube BID WC Lelon Perla, MD   6.25 mg at 03/06/19 0935  . chlorhexidine gluconate (MEDLINE KIT) (PERIDEX) 0.12 % solution 15 mL  15 mL Mouth Rinse BID Jennelle Human B, NP   15 mL at 03/06/19 0907  . Chlorhexidine Gluconate Cloth 2 % PADS 6 each  6 each Topical Q0600 Collene Gobble, MD   6 each at 03/05/19 2200  . clonazePAM (KLONOPIN) disintegrating tablet 0.5 mg  0.5 mg Per Tube TID Deterding, Guadelupe Sabin, MD   0.5 mg at 03/06/19 0935  . dextrose 50 % solution 25 mL  25 mL Intravenous PRN Belva Crome, MD   25 mL at 03/05/19 0753  . diphenhydrAMINE (BENADRYL) capsule 25 mg  25 mg Oral Q6H PRN Frederik Pear, MD   25 mg at 03/02/19 2017  . fentaNYL (SUBLIMAZE) injection 50 mcg  50 mcg Intravenous Once Jennelle Human B, NP      . free water 250 mL  250 mL Per Tube Q6H Jennelle Human B, NP   250 mL at 03/06/19 0948  . furosemide (LASIX) injection 80 mg  80 mg Intravenous Q6H Rush Farmer, MD   80 mg at 03/06/19 0935  . hydrALAZINE (APRESOLINE) injection 10-40 mg  10-40 mg Intravenous Q4H PRN Rigoberto Noel, MD   20 mg at 02/24/19 1505  . hydrALAZINE (APRESOLINE) tablet 10 mg  10 mg Per Tube Q8H Chesley Mires, MD   10 mg at 03/06/19 1423  . insulin aspart (novoLOG) injection 0-24 Units  0-24 Units Subcutaneous Q4H Belva Crome, MD   8 Units at 03/06/19 1209  . ipratropium-albuterol (DUONEB) 0.5-2.5 (3) MG/3ML nebulizer solution 3 mL  3 mL Nebulization Q4H PRN Rigoberto Noel, MD      . isosorbide dinitrate (ISORDIL) tablet 10 mg  10 mg Per Tube TID Corey Harold, NP   10 mg at 03/06/19 0935  . lidocaine (PF) (XYLOCAINE) 1 % injection    PRN Sandi Mariscal, MD   5 mL at 02/26/19 1304  . MEDLINE mouth rinse  15 mL Mouth Rinse 10 times per day Kipp Brood, MD   15 mL at 03/06/19 1219  . multivitamin liquid 15 mL  15 mL Per Tube Daily Rigoberto Noel, MD   15 mL at 03/06/19 0935  . ondansetron (ZOFRAN) injection 4 mg  4 mg Intravenous Q6H PRN Belva Crome, MD   4 mg at 03/05/19 1329  . pantoprazole (PROTONIX) injection 40 mg  40 mg Intravenous Q12H Gardiner Barefoot, NP   40 mg at 03/06/19 0445  . polyethylene glycol (MIRALAX / GLYCOLAX) packet 17 g  17 g Oral Daily Belva Crome, MD   17 g at 03/05/19 1000  . potassium chloride 20 MEQ/15ML (10%) solution 40 mEq  40 mEq Per Tube TID Rush Farmer, MD   40 mEq at 03/06/19 0935  . psyllium (HYDROCIL/METAMUCIL) packet 1 packet  1 packet Per Tube Daily Kipp Brood, MD   1 packet at 03/06/19 0941  . QUEtiapine (SEROQUEL) tablet 25  mg  25 mg Per Tube QHS Chesley Mires, MD   25 mg at 03/05/19 2151  . sennosides (SENOKOT) 8.8 MG/5ML syrup 10 mL  10 mL Per Tube QHS PRN Chesley Mires, MD      . simethicone (MYLICON) chewable tablet 40 mg  40 mg  Per Tube Q6H PRN Cristal Generous, NP   40 mg at 03/05/19 0800  . sodium chloride flush (NS) 0.9 % injection 10-40 mL  10-40 mL Intracatheter Q12H Belva Crome, MD   10 mL at 03/06/19 0942  . sodium chloride flush (NS) 0.9 % injection 10-40 mL  10-40 mL Intracatheter PRN Belva Crome, MD         Discharge Medications: Please see discharge summary for a list of discharge medications.  Relevant Imaging Results:  Relevant Lab Results:   Additional Information SSN: [id number], PT recommending LTACH however, pt [facility] not [facility] due to payor.  Cambria Osten A Halea Lieb, LCSW

## 2019-03-06 NOTE — [place] FL2 (Deleted)
[place] MEDICAID FL2 LEVEL OF CARE SCREENING TOOL     IDENTIFICATION  Patient Name: Gabrielle Wright Birthdate: 08-Dec-1945 Sex: female Admission Date (Current Location): 02/03/2019  [facility] and [place] Number:  Herbalist and Address:  [facility] [place], [place] [address] [place], [place] [id number]      Provider Number: [id number]  Attending Physician Name and Address:  Allie Bossier, MD  Relative Name and Phone Number:       Current Level of Care: Hospital Recommended Level of Care: Vent SNF Prior Approval Number:    Date Approved/Denied:   PASRR Number:    Discharge Plan: SNF    Current Diagnoses: Patient Active Problem List   Diagnosis Date Noted  . Tracheostomy status ([place])   . HCAP (healthcare-associated pneumonia)   . Acute pulmonary edema (H[facility])   . Abdominal distension (gaseous)   . Acute on chronic respiratory failure with hypoxia ([place])   . AKI (acute kidney injury) ([place])   . Septic shock ([place])   . CAD (coronary artery disease), native coronary artery 02/09/2019  . 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 Sepsis ([place]) 02/04/2019  . Hypotension 02/04/2019  . Acute respiratory failure with hypoxemia (HCC)     Orientation RESPIRATION BLADDER Height & Weight     (unable to assess, pt on ventilator)  Vent(Fi02 30%) Incontinent([place] in place) Weight: 179 lb 3.7 oz (81.3 kg) Height:  5' 6"  (167.6 cm)  BEHAVIORAL SYMPTOMS/MOOD NEUROLOGICAL BOWEL NUTRITION STATUS      Incontinent Feeding tube(Glucerna 1.5 four times daily (total volume = 948 ml) via Cortrak. )  AMBULATORY STATUS COMMUNICATION OF NEEDS Skin   Extensive Assist Does not communicate Normal                       Personal Care Assistance Level of Assistance  Feeding, Bathing, Dressing Bathing Assistance: Maximum assistance Feeding assistance: Maximum assistance Dressing Assistance: Maximum assistance      Functional Limitations Info  Sight, Hearing, Speech Sight Info: Adequate Hearing Info: Adequate Speech Info: Impaired(pt on ventilator)    SPECIAL CARE FACTORS FREQUENCY  OT (By licensed OT), PT (By licensed PT)     PT Frequency: 2x OT Frequency: 2x            Contractures Contractures Info: Not present    Additional Factors Info  Code Status, Allergies Code Status Info: Full Code Allergies Info: NO known allergies           Current Medications (03/06/2019):  This is the current hospital active medication list Current Facility-Administered Medications  Medication Dose Route Frequency Provider Last Rate Last Dose  . 0.9 %  sodium chloride infusion (Manually program via Guardrails IV Fluids)   Intravenous Once Allie Bossier, MD      . 0.9 %  sodium chloride infusion  250 mL Intravenous Continuous Belva Crome, MD   Stopped at 03/04/19 0815  . acetaminophen (TYLENOL) solution 650 mg  650 mg Per Tube Q6H PRN Chesley Mires, MD   650 mg at 03/05/19 1711  . amiodarone (PACERONE) tablet 200 mg  200 mg Per Tube Daily Lelon Perla, MD   200 mg at 03/06/19 0935  . atorvastatin (LIPITOR) tablet 40 mg  40 mg Per Tube q1800 Sande Rives E, PA-C   40 mg at 03/05/19 1700  . bisacodyl (DULCOLAX) suppository 10 mg  10 mg Rectal Daily  PRN Cristal Generous, NP      . carvedilol (COREG) tablet 6.25 mg  6.25 mg Per NG tube BID WC Lelon Perla, MD   6.25 mg at 03/06/19 0935  . chlorhexidine gluconate (MEDLINE KIT) (PERIDEX) 0.12 % solution 15 mL  15 mL Mouth Rinse BID Jennelle Human B, NP   15 mL at 03/06/19 0907  . Chlorhexidine Gluconate Cloth 2 % PADS 6 each  6 each Topical Q0600 Collene Gobble, MD   6 each at 03/05/19 2200  . clonazePAM (KLONOPIN) disintegrating tablet 0.5 mg  0.5 mg Per Tube TID Deterding, Guadelupe Sabin, MD   0.5 mg at 03/06/19 0935  . dextrose 50 % solution 25 mL  25 mL Intravenous PRN Belva Crome, MD   25 mL at 03/05/19 0753  . diphenhydrAMINE (BENADRYL)  capsule 25 mg  25 mg Oral Q6H PRN Frederik Pear, MD   25 mg at 03/02/19 2017  . fentaNYL (SUBLIMAZE) injection 50 mcg  50 mcg Intravenous Once Jennelle Human B, NP      . free water 250 mL  250 mL Per Tube Q6H Jennelle Human B, NP   250 mL at 03/06/19 0948  . furosemide (LASIX) injection 80 mg  80 mg Intravenous Q6H Rush Farmer, MD   80 mg at 03/06/19 0935  . hydrALAZINE (APRESOLINE) injection 10-40 mg  10-40 mg Intravenous Q4H PRN Rigoberto Noel, MD   20 mg at 02/24/19 1505  . hydrALAZINE (APRESOLINE) tablet 10 mg  10 mg Per Tube Q8H Chesley Mires, MD   10 mg at 03/06/19 0543  . insulin aspart (novoLOG) injection 0-24 Units  0-24 Units Subcutaneous Q4H Belva Crome, MD   8 Units at 03/06/19 1209  . ipratropium-albuterol (DUONEB) 0.5-2.5 (3) MG/3ML nebulizer solution 3 mL  3 mL Nebulization Q4H PRN Rigoberto Noel, MD      . isosorbide dinitrate (ISORDIL) tablet 10 mg  10 mg Per Tube TID Corey Harold, NP   10 mg at 03/06/19 0935  . lidocaine (PF) (XYLOCAINE) 1 % injection    PRN Sandi Mariscal, MD   5 mL at 02/26/19 1304  . MEDLINE mouth rinse  15 mL Mouth Rinse 10 times per day Kipp Brood, MD   15 mL at 03/06/19 1219  . multivitamin liquid 15 mL  15 mL Per Tube Daily Rigoberto Noel, MD   15 mL at 03/06/19 0935  . ondansetron (ZOFRAN) injection 4 mg  4 mg Intravenous Q6H PRN Belva Crome, MD   4 mg at 03/05/19 1329  . pantoprazole (PROTONIX) injection 40 mg  40 mg Intravenous Q12H Gardiner Barefoot, NP   40 mg at 03/06/19 0445  . polyethylene glycol (MIRALAX / GLYCOLAX) packet 17 g  17 g Oral Daily Belva Crome, MD   17 g at 03/05/19 1000  . potassium chloride 20 MEQ/15ML (10%) solution 40 mEq  40 mEq Per Tube TID Rush Farmer, MD   40 mEq at 03/06/19 0935  . psyllium (HYDROCIL/METAMUCIL) packet 1 packet  1 packet Per Tube Daily Kipp Brood, MD   1 packet at 03/06/19 0941  . QUEtiapine (SEROQUEL) tablet 25 mg  25 mg Per Tube QHS Chesley Mires, MD   25 mg at 03/05/19 2151  .  sennosides (SENOKOT) 8.8 MG/5ML syrup 10 mL  10 mL Per Tube QHS PRN Chesley Mires, MD      . simethicone (MYLICON) chewable tablet 40 mg  40  mg Per Tube Q6H PRN Cristal Generous, NP   40 mg at 03/05/19 0800  . sodium chloride flush (NS) 0.9 % injection 10-40 mL  10-40 mL Intracatheter Q12H Belva Crome, MD   10 mL at 03/06/19 0942  . sodium chloride flush (NS) 0.9 % injection 10-40 mL  10-40 mL Intracatheter PRN Belva Crome, MD         Discharge Medications: Please see discharge summary for a list of discharge medications.  Relevant Imaging Results:  Relevant Lab Results:   Additional Information    Hyatt Capobianco A Coron Rossano, LCSW

## 2019-03-06 NOTE — Progress Notes (Signed)
Patient appears to have dark blood coming from her NG tube. Abdomen is more distended. Labs were collected and Hgb is 7.3. Vitals are stable. MD notified. Will continue to monitor for any changes.

## 2019-03-06 NOTE — Progress Notes (Signed)
Facilitated video phone call with multiple family members via Elink camera

## 2019-03-06 NOTE — Progress Notes (Addendum)
NAME:  Gabrielle Wright, MRN:  [id number], DOB:  10/26/1946, LOS: 30 ADMISSION DATE:  02/03/2019, CONSULTATION DATE: 02/04/2019 REFERRING MD: Emergency department physician CHIEF COMPLAINT: Fever respiratory failure  Brief History   73 year old elderly woman visiting from [place], admitted 3/10 with fevers, hypotension, found to have group A strep bacteremia, required mechanical ventilation Course complicated by non-STEMI and new LV dysfunction ? Ischemic vs Takatsubo's Failed extubation 3/18 due to acute pulmonary edema.  Past Medical History  Hypertension Coronary artery disease Suspected diabetes.  Significant Hospital Is Events   02/04/2019 transfer from [facility] to [place] required intubation and pressors. 3/16 off levophed 3/18 extubated briefly but reintubated due to pulmonary edema 3/19 started milrinone 3/20 Episode of respiratory distress,  Precedex changed to propofol Atrial fibrillation RVR >>amiodarone bolus >> sinus rhythm 3/21 rising creat , AF-RVR >> amio bolus 3/24:rising creatinine 3/30: reintubated 3/31 trach 4/1 PICC  Consults:  02/04/2019 ID  IR   Procedures:  ETT 3/10 > 3/18, 3/18 > 3/31 Trach 3/31 > CVL 3/10 > 4/3 PICC 4/1 >   Significant Diagnostic Tests:  Echo 3/15 decreased EF 35 to 40%, LVEDP 27, no significant MR Cardiac cath >> LVEDP 40, RCA totally occluded, patent OM1 stent but distally occluded, apical LAD diffusely diseased, no intervention Chest x-ray 3/22  bilateral lower lobe airspace disease versus layering effusions CXR 3/23> pulmonary edema and bilateral pleural effusions. CXR 4/1> bibasilar opacities, small R pleural effusion, tracheostomy tube present and appropriately positioned  CXR 4/9> bilateral infiltrates with R pleural effusion  Micro Data:  02/04/2019 blood cultures x2>> Gr A strep 02/04/2019 sputum culture>> neg 02/04/2019 urine culture>>neg 02/04/2019 respiratory virus panel>> rhinovirus 02/04/2019 flu a  and B>> neg coronavirus testing 02/04/2019>>neg resp 3/21 >> Klebsiella oxytoca, co-ag negative staph  Antimicrobials:  02/04/2019 vancomycin>>3/ 10 02/04/2019 Zosyn>> 3/10 Pen G 3/10 >> 3/22 (plan) Ceftx 3/20 >>3/24 Cipro 3/24>> 3/31  Interim history/subjective:  No significant weaning. NGT placed yesterday due to abd distension; RN reports has had regular bowel movements daily  Objective   Blood pressure (!) 103/53, pulse 87, temperature 98.4 F (36.9 C), temperature source Oral, resp. rate (!) 22, height 5\' 6"  (1.676 m), weight 81.3 kg, SpO2 98 %. CVP:  [5 mmHg-16 mmHg] 13 mmHg  Vent Mode: CPAP;PSV FiO2 (%):  [30 %] 30 % Set Rate:  [12 bmp] 12 bmp Vt Set:  [450 mL] 450 mL PEEP:  [5 cmH20] 5 cmH20 Pressure Support:  [10 cmH20] 10 cmH20 Plateau Pressure:  [8 cmH20-27 cmH20] 8 cmH20   Intake/Output Summary (Last 24 hours) at 03/06/2019 0802 Last data filed at 03/06/2019 0500 Gross per 24 hour  Intake 1187 ml  Output 1925 ml  Net -738 ml   Filed Weights   03/05/19 0200 03/05/19 0842 03/06/19 0350  Weight: 80.9 kg 80.6 kg 81.3 kg   Examination:  General: adult female on vent Neuro: Awake, responds appropriately  HEENT: [place]/AT. No JVD. Trach in place.  Cardiovascular: RRR, no MRG.  Lungs: Clear. Scattered rhonchi Abdomen:  soft, distended, non tender  Musculoskeletal: No gross deformities, no edema.  Skin: Grossly intact.   Assessment & Plan:   Acute respiratory failure requiring mechanical ventilation - s/p trach 3/31. - RN reports having trouble with weaning; CXR with persistent pulm edema and net neg <[id number]<MEASUREMRowan Blase CXR with persistent pulm edema R>L with pleural effusion - consider increasing diuretics dose and frequency - wean as able.  - Chair position today.   Hypernatremia. - Continue  free water per tube. - Diuresis as tolerated  Ischemic Cardiomyopathy (Echo 3/26 EF 35-40%, LV diffuse hypokinesis) - no targets for revascularization on cath - Cardiology  following. - Continue aspirin, Plavix, coreg, hydralazine, imdur  Atrial Fibrillation with RVR. - Continue amio, coreg.  Diabetes type 2/uncontrolled hyperglycemia. - SSI resistant. - Continue levimir 50 units BID. SSI.  Borderline hypoglycemia. Keep an eye. May need to lower dose as TF carbohydrate concentration has changed.   Intermittent agitation - exacerbated by language barrier (family video conference has seemed to help).  Wonder how much of this is simply deconditioning.  - Continue clonazepam - Seroquel QHS - PT to work with patient.  - Establish good sleep wake cycle.   Dysphagia:  - Continue TF Cortrak. May need to consider PEG over the next few days.   CM on board for LTAC options.   GI ppx: Protonix VAP: per protocol Pain/Sedation: PRN fentanyl / PRN clonazepam DVT ppx: SCDs / Heparin Diet: Enteral Nutrition Code Status: full   Nyra Market, MD PGY3 Pager [phone]  03/06/2019 8:02 AM  Attending Note:  73 year old female with pneumonia induced respiratory failure, failed trach collar overnight.  On exam, sitting in bed, following commands with clear lungs bilaterally.  [name] is in a good position.  I reviewed CXR myself, pulmonary edema noted, trach is in a good position.  Discussed with resident.  Will begin PS trials for now.  Hold off TC.  Increased lasix to 80 TID and zaroxolyn.  Will continue vent support for now.  The patient is critically ill with multiple organ systems failure and requires high complexity decision making for assessment and support, frequent evaluation and titration of therapies, application of advanced monitoring technologies and extensive interpretation of multiple databases.   Critical Care Time devoted to patient care services described in this note is  35  Minutes. This time reflects time of care of this signee Dr Koren Bound. This critical care time does not reflect procedure time, or teaching time or supervisory time of PA/NP/Med  student/Med Resident etc but could involve care discussion time.  Alyson Reedy, M.D. [facility] Pulmonary/Critical Care Medicine. Pager: [phone]. After hours pager: [phone].

## 2019-03-06 NOTE — Plan of Care (Signed)
  Problem: Clinical Measurements: Goal: Respiratory complications will improve Outcome: Not Progressing   Problem: Clinical Measurements: Goal: Cardiovascular complication will be avoided Outcome: Progressing   Problem: Pain Managment: Goal: General experience of comfort will improve Outcome: Progressing   Problem: Safety: Goal: Ability to remain free from injury will improve Outcome: Progressing   Problem: Skin Integrity: Goal: Risk for impaired skin integrity will decrease Outcome: Progressing   Problem: Role Relationship: Goal: Method of communication will improve Outcome: Progressing

## 2019-03-06 NOTE — Progress Notes (Signed)
PROGRESS NOTE    Gabrielle Wright  SMO:[id number] DOB: [birth year] DOA: 02/03/2019 PCP: Rudene Anda, MD   Brief Narrative:  73 year old female PMHx from [place]. HTN , CAD, DM Type 2?  Noted to have a high fever 103.9. Reported to have cough. Taken to [facility] per family. Was supported to have a urinary tract infection although her urine was clean. She never filled her prescription for antibiotics. Due to persistent hypotension and refractory nature of her hypertension she is transferred to [facility] 02/04/2019. She was in respiratory distress for abdominal chest wall paradoxus. She required intubation and central line placement. She had maxed out on peripheral IV vasopressors therefore central line was placed. Pulmonary critical care will be responsible for care at this time. She was placed in isolation since she came from a 87 entry of [place] with recent reports of coronavirus and that country. Note [place] is not on the list of infected countries. Infectious disease was called and they reported again diagnosis done on 1 reported countries.     Subjective: 4/9 patient much more awake and interactive this a.m.  Able to ask for a warm blanket, and for [place] to turn off the overhead lights.  Per RN patient still pulling at some of her lines.   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 - Patient still with pulmonary edema: Attempted weaning today patient lasted 20 minutes on PC.   - See ischemic cardiomyopathy  Ischemic cardiomyopathy-acute on chronic systolic and diastolic CHF - 9/20 echocardiogram EF 35 to 40%, LV diffuse hypokinesis, no targets for revascularization on catheterization - Cardiology following - 4/8 Lasix 60 mg x 1 - 4/9 increase Lasix 80 mg QID for 3 doses and then reassess  - 4/7 CVP 13 (elevated) -  4/9 PCXR: Continued pleural effusions -Strict in and out -1.6 L -Daily weight Filed Weights   03/05/19 0200 03/05/19 0842 03/06/19 0350  Weight: 80.9 kg 80.6 kg 81.3 kg  -Amiodarone 200 mg daily -Coreg 6.25 mg BID -Plavix 75 mg daily (hold) -4/9 increase Lasix 80 mg BID -Hydralazine PRN -Hydralazine 10 mg 3 times daily -Isosorbide dinitrate 10 mg 3 times daily -4/9 transfuse 1 unit PRBC -Transfuse for hemoglobin<8   A. fib with RVR - See ischemic cardiomyopathy  Hypernatremia - Resolved -Free water 247m QID  Diabetes type 2 uncontrolled with complication - 31/00hemoglobin A1c= 10.1 - Levemir 50 units nightly - Custom SSI   Intermittent agitation -Exacerbated by language barrier.  National language? - Clonazepam 0.5 mg 3 times daily - Seroquel 25 mg nightly -During the day all shades and lights on  Dysphasia - Feedings per core track on hold secondary to abdominal pain.  GI bleed? - Patient's H/H has dropped overnight cardiology has held Plavix and aspirin  -Hold subcu heparin    DVT prophylaxis: S CD Code Status: Full Family Communication: None Disposition Plan: TBD  Consultants:  Cardiology PCCM    Procedures/Significant Events:  4/8 acute abdominal series: Negative ileus or SBO 4/9 PCXR: Unchanged layering pleural effusion and pulmonary opacification     I have personally reviewed and interpreted all radiology studies and my findings are as above.  VENTILATOR SETTINGS:  Mode: PRVC Vt set: 450 ml Set rate: 12 FiO2: 30 PEEP: 5   Cultures   Antimicrobials: Anti-infectives (From admission, onward)   Start     Stop   02/19/19 1200  ciprofloxacin (CIPRO) IVPB 400 mg     02/25/19 1321   02/18/19 1230  ciprofloxacin (CIPRO) IVPB 400 mg  Status:  Discontinued     02/19/19 1119   02/14/19 0900  cefTRIAXone (ROCEPHIN) 2 g in sodium chloride 0.9 % 100 mL IVPB     02/16/19 1033   02/06/19 1900  penicillin G potassium 8 Million Units in dextrose 5 % 500 mL continuous infusion  Status:  Discontinued     02/14/19 0846   02/06/19 1100  penicillin G potassium 8 Million Units in dextrose 5 % 500 mL continuous infusion  Status:  Discontinued     02/06/19 1900   02/05/19 2300  penicillin G potassium 4 Million Units in dextrose 5 % 250 mL IVPB  Status:  Discontinued     02/04/19 2237   02/04/19 2300  vancomycin (VANCOCIN) 1,500 mg in sodium chloride 0.9 % 500 mL IVPB     02/05/19 0308   02/04/19 2300  penicillin G potassium 4 Million Units in dextrose 5 % 250 mL IVPB  Status:  Discontinued     02/06/19 1008   02/04/19 2230  clindamycin (CLEOCIN) IVPB 600 mg  Status:  Discontinued     02/05/19 0917   02/04/19 0315  piperacillin-tazobactam (ZOSYN) IVPB 3.375 g     02/04/19 0408   02/04/19 0315  vancomycin (VANCOCIN) IVPB 1000 mg/200 mL premix     02/04/19 0424       Devices    LINES / TUBES:  6 mm cuffed trach 3/31>>    Continuous Infusions: . sodium chloride Stopped (03/04/19 0815)     Objective: Vitals:   03/06/19 0500 03/06/19 0505 03/06/19 0600 03/06/19 0708  BP: (!) 109/55 (!) 109/55 (!) 99/52 (!) 103/53  Pulse:  85  87  Resp: 19 18 16  (!) 22  Temp:      TempSrc:      SpO2: 100% 100%  98%  Weight:      Height:        Intake/Output Summary (Last 24 hours) at 03/06/2019 0747 Last data filed at 03/06/2019 0500 Gross per 24 hour  Intake 1262 ml  Output 1925 ml  Net -663 ml   Filed Weights   03/05/19 0200 03/05/19 0842 03/06/19 0350   Weight: 80.9 kg 80.6 kg 81.3 kg   General: Patient much more alert today able to make known her needs, positive acute respiratory distress Eyes: negative scleral hemorrhage, negative anisocoria, negative icterus ENT: Negative Runny nose, negative gingival bleeding, Neck:  Negative scars, masses, torticollis, lymphadenopathy, JVD, 6 mm cuffed trach in place negative sign of infection or bleeding Lungs: Positive diffuse rhonchi but decreased from 4/8, negative wheezes or crackles Cardiovascular: Regular rate and rhythm without murmur gallop or rub normal S1 and S2 Abdomen: Positive abdominal pain, positive distention , positive soft, bowel sounds, no rebound, no ascites, no appreciable mass Extremities: No significant cyanosis, clubbing, or edema bilateral lower extremities Skin: Negative rashes, lesions, ulcers Psychiatric: Unable to fully  assess secondary to patient being on ventilator  Central nervous system: Patient spontaneously moves all extremities cooperates with exam.  Unable to fully assess secondary to patient being on ventilator and language barrier.  .     Data Reviewed: Care during the described time interval was provided by me .  I have reviewed this patient's available data, including medical history, events of note, physical examination, and all test results as part of my evaluation.   CBC: Recent Labs  Lab 02/28/19 0421 03/01/19 0355 03/03/19 0444 03/06/19 0318  WBC 10.5 9.7 12.0* 10.3  NEUTROABS  --   --  8.9* 7.4  HGB 8.6* 8.2* 8.1* 7.3*  HCT 31.5* 29.9* 29.4* 26.5*  MCV 91.0 89.0 88.8 88.3  PLT 307 249 210 353   Basic Metabolic Panel: Recent Labs  Lab 02/28/19 0421 03/01/19 0355 03/03/19 0444 03/04/19 0837 03/06/19 0318  NA 150* 147* 141 143 140  K 3.6 3.6 3.7 3.4* 3.8  CL 106 108 103 105 101  CO2 32 31 29 27 28   GLUCOSE 132* 182* 223* 130* 127*  BUN 78* 63* 50* 50* 44*  CREATININE 1.27* 1.12* 1.09* 1.17* 1.10*  CALCIUM 8.7* 8.3* 7.9* 7.5* 7.9*  MG  2.7* 2.7*  --   --  2.8*  PHOS 3.6 3.6  --   --   --    GFR: Estimated Creatinine Clearance: 49.7 mL/min (A) (by C-G formula based on SCr of 1.1 mg/dL (H)). Liver Function Tests: Recent Labs  Lab 02/28/19 0421  AST 22  ALT 11  ALKPHOS 53  BILITOT 0.5  PROT 6.3*  ALBUMIN 2.4*   No results for input(s): LIPASE, AMYLASE in the last 168 hours. No results for input(s): AMMONIA in the last 168 hours. Coagulation Profile: No results for input(s): INR, PROTIME in the last 168 hours. Cardiac Enzymes: Recent Labs  Lab 03/03/19 1521 03/03/19 2254 03/04/19 0452  TROPONINI 0.11* 0.12* 0.11*   BNP (last 3 results) No results for input(s): PROBNP in the last 8760 hours. HbA1C: No results for input(s): HGBA1C in the last 72 hours. CBG: Recent Labs  Lab 03/05/19 1140 03/05/19 1543 03/05/19 2026 03/05/19 2338 03/06/19 0309  GLUCAP 88 87 86 83 114*   Lipid Profile: No results for input(s): CHOL, HDL, LDLCALC, TRIG, CHOLHDL, LDLDIRECT in the last 72 hours. Thyroid Function Tests: No results for input(s): TSH, T4TOTAL, FREET4, T3FREE, THYROIDAB in the last 72 hours. Anemia Panel: No results for input(s): VITAMINB12, FOLATE, FERRITIN, TIBC, IRON, RETICCTPCT in the last 72 hours. Urine analysis:    Component Value Date/Time   COLORURINE YELLOW 02/04/2019 0123   APPEARANCEUR CLEAR 02/04/2019 0123   LABSPEC 1.015 02/04/2019 0123   PHURINE 5.5 02/04/2019 0123   GLUCOSEU NEGATIVE 02/04/2019 0123   HGBUR NEGATIVE 02/04/2019 0123   BILIRUBINUR NEGATIVE 02/04/2019 0123   KETONESUR NEGATIVE 02/04/2019 0123   PROTEINUR NEGATIVE 02/04/2019 0123   NITRITE NEGATIVE 02/04/2019 0123   LEUKOCYTESUR NEGATIVE 02/04/2019 0123   Sepsis Labs: @LABRCNTIP (procalcitonin:4,lacticidven:4)  )No results found for this or any previous visit (from the past 240 hour(s)).       Radiology Studies: Dg Abd 1 View  Result Date: 03/06/2019 CLINICAL DATA:  Ileus EXAM: ABDOMEN - 1 VIEW COMPARISON:   Yesterday FINDINGS: Feeding tube tip is at the pylorus. Gastric suction tube is at the stomach body. Haziness of the lower chest from pulmonary opacity and layering pleural effusions. The visualized bowel gas pattern is normal. Arterial calcification. IMPRESSION: Feeding tube tip at the pylorus  and gastric suction tube tip at the gastric body-stable from yesterday Electronically Signed   By: Monte Fantasia M.D.   On: 03/06/2019 05:21   Dg Chest Port 1 View  Result Date: 03/06/2019 CLINICAL DATA:  Pulmonary edema EXAM: PORTABLE CHEST 1 VIEW COMPARISON:  Three days ago FINDINGS: Haziness of the bilateral chest with layering pleural effusions. There is also lung opacity which could be infection or edema. Tracheostomy tube in place. The enteric tubes reache the stomach. Right PICC with tip at the upper cavoatrial junction. No pneumothorax. IMPRESSION: 1. Unremarkable hardware positioning. 2. Unchanged layering pleural effusions and pulmonary opacification. Electronically Signed   By: Monte Fantasia M.D.   On: 03/06/2019 05:37   Dg Abd Portable 1v  Result Date: 03/05/2019 CLINICAL DATA:  NG tube placement EXAM: PORTABLE ABDOMEN - 1 VIEW COMPARISON:  02/24/2019 FINDINGS: NG tube is in the mid stomach. Feeding tube has been placed with the tip in the distal stomach or proximal duodenum. Nonobstructive bowel gas pattern. Diffuse airspace disease throughout the lungs with layering effusions. IMPRESSION: NG tube remains in the mid stomach. Feeding tube tip is in the peri pyloric region. Electronically Signed   By: Rolm Baptise M.D.   On: 03/05/2019 11:15        Scheduled Meds: . amiodarone  200 mg Per Tube Daily  . atorvastatin  40 mg Per Tube q1800  . carvedilol  6.25 mg Per NG tube BID WC  . chlorhexidine gluconate (MEDLINE KIT)  15 mL Mouth Rinse BID  . Chlorhexidine Gluconate Cloth  6 each Topical Q0600  . clonazepam  0.5 mg Per Tube TID  . feeding supplement (GLUCERNA 1.5 CAL)  237 mL Per Tube QID   . feeding supplement (PRO-STAT SUGAR FREE 64)  30 mL Per Tube Daily  . fentaNYL (SUBLIMAZE) injection  50 mcg Intravenous Once  . free water  250 mL Per Tube Q6H  . furosemide  40 mg Intravenous Daily  . heparin injection (subcutaneous)  5,000 Units Subcutaneous Q8H  . hydrALAZINE  10 mg Per Tube Q8H  . insulin aspart  0-24 Units Subcutaneous Q4H  . insulin detemir  50 Units Subcutaneous QHS  . isosorbide dinitrate  10 mg Per Tube TID  . mouth rinse  15 mL Mouth Rinse 10 times per day  . multivitamin  15 mL Per Tube Daily  . pantoprazole (PROTONIX) IV  40 mg Intravenous Q12H  . polyethylene glycol  17 g Oral Daily  . psyllium  1 packet Per Tube Daily  . QUEtiapine  25 mg Per Tube QHS  . sodium chloride flush  10-40 mL Intracatheter Q12H   Continuous Infusions: . sodium chloride Stopped (03/04/19 0815)     LOS: 30 days   The patient is critically ill with multiple organ systems failure and requires high complexity decision making for assessment and support, frequent evaluation and titration of therapies, application of advanced monitoring technologies and extensive interpretation of multiple databases. Critical Care Time devoted to patient care services described in this note  Time spent: 40 minutes     Antwonette Feliz, Geraldo Docker, MD [facility] Pager [phone]   If 7PM-7AM, please contact night-coverage [email or web address] Password [facility] 03/06/2019, 7:47 AM

## 2019-03-06 NOTE — TOC Initial Note (Addendum)
Transition of Care [facility]) - Initial/Assessment Note    Patient Details  Name: Gabrielle Wright MRN: [id number] Date of Birth: February 26, 1946  Transition [facility]) CM/SW Contact:    Maree Krabbe, LCSW Phone Number: 03/06/2019, 2:43 PM  Clinical Narrative:    Pt is on ventilator. CSW spoke with pt's daughter via telephone. Pt's daughter states pt is originally from [place] however, pt lives with her now. Pt was asking if the hospital was trying to d/c her mom now--CSW explained that pt is not d/c however vent SNF placement can take quiet a bit of time. CSW is unsure if pt's family has realistic expectations. Pt's daughter asked if the nursing home could help with pt's vent at home. CSW explained the pt would have to go to facility that can manage her Vent or the daughter would have to manage the vent at home. CSW explained the Vent SNF process- pt seemed to understand. CSW explained that most facilities are out of town and some out of state. Pt's daughter request that the facility be as close as possible. CSW expressed understanding and will start with the closest facility. CSW Pt does have [place] Medicaid. Pt does have a social. Pt has a negative Covid-19 test.   CSW will begin the Vent SNF search.         Expected Discharge Plan: Skilled Nursing Facility Barriers to Discharge: Continued Medical Work up   Patient Goals and CMS Choice   CMS Medicare.gov Compare Post Acute Care list provided to:: Patient Represenative (must comment) Choice offered to / list presented to : Adult Children  Expected Discharge Plan and Services Expected Discharge Plan: Skilled Nursing Facility In-house Referral: Clinical Social Work Discharge Planning Services: NA                     DME Arranged: N/A DME Agency: NA HH Arranged: NA HH Agency: NA  Prior Living Arrangements/Services   Lives with:: Adult Children Patient language and need for interpreter reviewed:: Yes Do you feel safe going back to the place  where you live?: Yes      Need for Family Participation in Patient Care: Yes (Comment) Care giver support system in place?: Yes (comment)   Criminal Activity/Legal Involvement Pertinent to Current Situation/Hospitalization: No - Comment as needed  Activities of Daily Living Home Assistive Devices/Equipment: None ADL Screening (condition at time of admission) Patient's cognitive ability adequate to safely complete daily activities?: Yes Is the patient deaf or have difficulty hearing?: No Does the patient have difficulty seeing, even when wearing glasses/contacts?: No Does the patient have difficulty concentrating, remembering, or making decisions?: No Patient able to express need for assistance with ADLs?: Yes Does the patient have difficulty dressing or bathing?: No Independently performs ADLs?: Yes (appropriate for developmental age) Does the patient have difficulty walking or climbing stairs?: No Weakness of Legs: Both Weakness of Arms/Hands: Both  Permission Sought/Granted Permission sought to share information with : Family Supports    Share Information with NAME: [email or web address]     Permission granted to share info w Relationship: daughter     Emotional Assessment Appearance:: Appears stated age Attitude/Demeanor/Rapport: Unable to Assess Affect (typically observed): Unable to Assess Orientation: : (unable to assess-ventilated) Alcohol / Substance Use: Not Applicable Psych Involvement: No (comment)  Admission diagnosis:  SIRS (systemic inflammatory response syndrome) (HCC) [R65.10] AKI (acute kidney injury) (HCC) [N17.9] Septic shock (HCC) [A41.9, R65.21] Sepsis (HCC) [A41.9] Patient Active Problem List   Diagnosis Date Noted  .  Tracheostomy status (HCC)   . HCAP (healthcare-associated pneumonia)   . Acute pulmonary edema (HCC)   . Abdominal distension (gaseous)   . Acute on chronic respiratory failure with hypoxia (HCC)   . AKI (acute kidney injury) (HCC)   . Septic  shock (HCC)   . CAD (coronary artery disease), native coronary artery 02/09/2019  . 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 Sepsis (HCC) 02/04/2019  . Hypotension 02/04/2019  . Acute respiratory failure with hypoxemia (HCC)    PCP:  Truett Perna, MD Pharmacy:   [facility] [place], [place] - [address] [place] [address] Phone: [phone] Fax: [phone]     Social Determinants of Health (SDOH) Interventions    Readmission Risk Interventions No flowsheet data found.

## 2019-03-06 NOTE — Progress Notes (Signed)
Physical Therapy Treatment Patient Details Name: Gabrielle Wright MRN: [id number] DOB: [birth year] Today's Date: 03/06/2019    History of Present Illness 73 y.o. female admitted on 02/03/19 for fever and cough (to [facility]) and was transferred to [facility] on 02/04/19 for hypotension and respiratory distress (acute respiratory failure/pulmonary edema) requiring intubation 3/10-current (time of PT eval 02/27/2019).  She was visiting family from [place] and COVID 19 tests were negative.  She was found to have group A strep bacteremia (septic shiock), rhinovirus,  and course complicated by NSTEMI and new LV dysfunction s/p cardiac cath on 02/09/19.  Cardiology following.  Other dx include ischemic cardiomyopathy, hyperkalemia, DM2- uncontrolled, and abdominal distention.  Pt with significant PMH of DM, HTN, anc CAD. Trach placed on 3/31    PT Comments    Pt remains very weak with slow progress. Family present via elink and happy to see pt working with therapy.    Follow Up Recommendations  SNF     Equipment Recommendations  Other (comment)(defer to next venue)    Recommendations for Other Services       Precautions / Restrictions Precautions Precautions: Fall;Other (comment) Precaution Comments: trach, vent Restrictions Weight Bearing Restrictions: No    Mobility  Bed Mobility Overal bed mobility: Needs Assistance Bed Mobility: Supine to Sit;Sit to Supine Rolling: Total assist   Supine to sit: +2 for physical assistance;Total assist Sit to supine: +2 for physical assistance;Total assist   General bed mobility comments: Assist for all aspects  Transfers                 General transfer comment: Pt up to chair earlier via lift  Ambulation/Gait                 Stairs             Wheelchair Mobility    Modified Rankin (Stroke Patients Only)       Balance Overall balance assessment: Needs assistance Sitting-balance support: Feet supported;Bilateral  upper extremity supported Sitting balance-Leahy Scale: Poor Sitting balance - Comments: Pt sat EOB x 15 minutes with min assist with some very brief intervals of min guard. Reaching activities with OT. Postural control: Posterior lean                                  Cognition Arousal/Alertness: Awake/alert Behavior During Therapy: Flat affect Overall Cognitive Status: Difficult to assess                                 General Comments: Pt following some commands with visual, tactile cues      Exercises General Exercises - Lower Extremity Long Arc Quad: AROM;Both;5 reps;Seated    General Comments        Pertinent Vitals/Pain Pain Assessment: Faces Faces Pain Scale: No hurt    Home Living                      Prior Function            PT Goals (current goals can now be found in the care plan section) Acute Rehab PT Goals Patient Stated Goal: agreeable to working with therapies Progress towards PT goals: Progressing toward goals    Frequency    Min 2X/week      PT Plan Discharge plan needs to be updated  Co-evaluation PT/OT/SLP Co-Evaluation/Treatment: Yes Reason for Co-Treatment: Complexity of the patient's impairments (multi-system involvement);For patient/therapist safety   OT goals addressed during session: Strengthening/ROM      AM-PAC PT "6 Clicks" Mobility   Outcome Measure  Help needed turning from your back to your side while in a flat bed without using bedrails?: Total Help needed moving from lying on your back to sitting on the side of a flat bed without using bedrails?: Total Help needed moving to and from a bed to a chair (including a wheelchair)?: Total Help needed standing up from a chair using your arms (e.g., wheelchair or bedside chair)?: Total Help needed to walk in hospital room?: Total Help needed climbing 3-5 steps with a railing? : Total 6 Click Score: 6    End of Session Equipment  Utilized During Treatment: Oxygen(vent) Activity Tolerance: Patient limited by fatigue Patient left: with call bell/phone within reach;in bed;with nursing/sitter in room(lift pad in place) Nurse Communication: Mobility status;Need for lift equipment PT Visit Diagnosis: Muscle weakness (generalized) (M62.81);Difficulty in walking, not elsewhere classified (R26.2)     Time: 8412-8208 PT Time Calculation (min) (ACUTE ONLY): 29 min  Charges:  $Therapeutic Activity: 8-22 mins                     [facility] PT Acute Rehabilitation Services Pager [phone] Office [phone]    Angelina Ok [facility] 03/06/2019, 3:03 PM

## 2019-03-06 NOTE — Progress Notes (Addendum)
Occupational Therapy Treatment Patient Details Name: Gabrielle Wright MRN: [id number] DOB: 1946/10/04 Today's Date: 03/06/2019    History of present illness 73 y.o. female admitted on 02/03/19 for fever and cough (to [facility]) and was transferred to [facility] on 02/04/19 for hypotension and respiratory distress (acute respiratory failure/pulmonary edema) requiring intubation 3/10-current (time of PT eval 02/27/2019).  She was visiting family from [place] and COVID 19 tests were negative.  She was found to have group A strep bacteremia (septic shiock), rhinovirus,  and course complicated by NSTEMI and new LV dysfunction s/p cardiac cath on 02/09/19.  Cardiology following.  Other dx include ischemic cardiomyopathy, hyperkalemia, DM2- uncontrolled, and abdominal distention.  Pt with significant PMH of DM, HTN, anc CAD. Trach placed on 3/31   OT comments  Pt making slow gains. Family observing session from e-link monitor and verbally encouraging and interpreting for pt. Pt sat EOB x 15 minutes with min assist and engaged in reaching activities with either UE. Pt with stable VS on ventilator via trach.  Follow Up Recommendations  SNF;Supervision/Assistance - 24 hour    Equipment Recommendations  Other (comment)(defer to next venue)    Recommendations for Other Services      Precautions / Restrictions Precautions Precautions: Fall;Other (comment) Precaution Comments: trach, vent, NGT       Mobility Bed Mobility Overal bed mobility: Needs Assistance Bed Mobility: Rolling;Supine to Sit;Sit to Supine Rolling: Total assist   Supine to sit: +2 for physical assistance;Total assist Sit to supine: +2 for physical assistance;Total assist   General bed mobility comments: assist for all aspects, use of bed pad to adjust hips  Transfers                 General transfer comment: per RN, pt was lifted to chair with maximove this morning    Balance Overall balance assessment: Needs  assistance Sitting-balance support: Feet supported;Bilateral upper extremity supported Sitting balance-Leahy Scale: Poor Sitting balance - Comments: min assist, min guard momentarily Postural control: Posterior lean                                 ADL either performed or assessed with clinical judgement   ADL                                         General ADL Comments: continues to require total assist     Vision   Additional Comments: worked on reaching activities B UEs at [employer]      Cognition Arousal/Alertness: Awake/alert Behavior During Therapy: Flat affect Overall Cognitive Status: Difficult to assess                                 General Comments: Pt following simple commands with increased time and multimodal cues.        Exercises     Shoulder Instructions       General Comments      Pertinent Vitals/ Pain       Pain Assessment: Faces Faces Pain Scale: No hurt  Home Living  Prior Functioning/Environment              Frequency  Min 3X/week        Progress Toward Goals  OT Goals(current goals can now be found in the care plan section)  Progress towards OT goals: Progressing toward goals  Acute Rehab OT Goals Patient Stated Goal: agreeable to working with therapies OT Goal Formulation: With family Time For Goal Achievement: 03/18/19 Potential to Achieve Goals: Fair  Plan Discharge plan remains appropriate    Co-evaluation    PT/OT/SLP Co-Evaluation/Treatment: Yes Reason for Co-Treatment: Complexity of the patient's impairments (multi-system involvement);For patient/therapist safety   OT goals addressed during session: Strengthening/ROM      AM-PAC OT "6 Clicks" Daily Activity     Outcome Measure   Help from another person eating meals?: Total Help from another person taking care of personal  grooming?: Total Help from another person toileting, which includes using toliet, bedpan, or urinal?: Total Help from another person bathing (including washing, rinsing, drying)?: Total Help from another person to put on and taking off regular upper body clothing?: Total Help from another person to put on and taking off regular lower body clothing?: Total 6 Click Score: 6    End of Session    OT Visit Diagnosis: Muscle weakness (generalized) (M62.81);Other symptoms and signs involving cognitive function   Activity Tolerance Patient limited by fatigue   Patient Left in bed;with call bell/phone within reach;with nursing/sitter in room   Nurse Communication Mobility status        Time: 4008-6761 OT Time Calculation (min): 29 min  Charges: OT General Charges $OT Visit: 1 Visit OT Treatments $Therapeutic Activity: 8-22 mins  Martie Round, OTR/L Acute Rehabilitation Services Pager: [phone] Office: [phone]   Evern Bio 03/06/2019, 3:02 PM

## 2019-03-06 NOTE — Progress Notes (Signed)
 Progress Note  Patient Name: Gabrielle Wright Date of Encounter: 03/06/2019  Primary Cardiologist: James Hochrein, MD   Subjective   Patient resting     Inpatient Medications    Scheduled Meds: . amiodarone  200 mg Per Tube Daily  . aspirin  81 mg Oral Daily  . atorvastatin  40 mg Per Tube q1800  . carvedilol  6.25 mg Per NG tube BID WC  . chlorhexidine gluconate (MEDLINE KIT)  15 mL Mouth Rinse BID  . Chlorhexidine Gluconate Cloth  6 each Topical Q0600  . clonazepam  0.5 mg Per Tube TID  . clopidogrel  75 mg Per Tube Daily  . feeding supplement (GLUCERNA 1.5 CAL)  237 mL Per Tube QID  . feeding supplement (PRO-STAT SUGAR FREE 64)  30 mL Per Tube Daily  . fentaNYL (SUBLIMAZE) injection  50 mcg Intravenous Once  . free water  250 mL Per Tube Q6H  . furosemide  40 mg Intravenous Daily  . heparin injection (subcutaneous)  5,000 Units Subcutaneous Q8H  . hydrALAZINE  10 mg Per Tube Q8H  . insulin aspart  0-24 Units Subcutaneous Q4H  . insulin detemir  50 Units Subcutaneous QHS  . isosorbide dinitrate  10 mg Per Tube TID  . mouth rinse  15 mL Mouth Rinse 10 times per day  . multivitamin  15 mL Per Tube Daily  . pantoprazole (PROTONIX) IV  40 mg Intravenous Q12H  . polyethylene glycol  17 g Oral Daily  . psyllium  1 packet Per Tube Daily  . QUEtiapine  25 mg Per Tube QHS  . sodium chloride flush  10-40 mL Intracatheter Q12H   Continuous Infusions: . sodium chloride Stopped (03/04/19 0815)   PRN Meds: acetaminophen (TYLENOL) oral liquid 160 mg/5 mL, bisacodyl, dextrose, diphenhydrAMINE, hydrALAZINE, ipratropium-albuterol, lidocaine (PF), ondansetron (ZOFRAN) IV, sennosides, simethicone, sodium chloride flush   Vital Signs    Vitals:   03/06/19 0500 03/06/19 0505 03/06/19 0600 03/06/19 0708  BP: (!) 109/55 (!) 109/55 (!) 99/52 (!) 103/53  Pulse:  85  87  Resp: 19 18 16 (!) 22  Temp:      TempSrc:      SpO2: 100% 100%  98%  Weight:      Height:        Intake/Output  Summary (Last 24 hours) at 03/06/2019 0742 Last data filed at 03/06/2019 0500 Gross per 24 hour  Intake 1262 ml  Output 1925 ml  Net -663 ml    Net neg 1.6 L      Last 3 Weights 03/06/2019 03/05/2019 03/05/2019  Weight (lbs) 179 lb 3.7 oz 177 lb 11.1 oz 178 lb 5.6 oz  Weight (kg) 81.3 kg 80.6 kg 80.9 kg      Telemetry     SR   Personally Reviewed  Physical Exam   GEN: Patient resting in NAD    Neck: tracheostomy Cardiac:  Irreg rate, rhythm Respiratory: CTA anteriorly; GI: abd distended Mild diffuse tenderness MS:  Tr edema    Labs    Chemistry Recent Labs  Lab 02/28/19 0421  03/03/19 0444 03/04/19 0837 03/06/19 0318  NA 150*   < > 141 143 140  K 3.6   < > 3.7 3.4* 3.8  CL 106   < > 103 105 101  CO2 32   < > 29 27 28  GLUCOSE 132*   < > 223* 130* 127*  BUN 78*   < > 50* 50* 44*  CREATININE 1.27*   < >   1.09* 1.17* 1.10*  CALCIUM 8.7*   < > 7.9* 7.5* 7.9*  PROT 6.3*  --   --   --   --   ALBUMIN 2.4*  --   --   --   --   AST 22  --   --   --   --   ALT 11  --   --   --   --   ALKPHOS 53  --   --   --   --   BILITOT 0.5  --   --   --   --   GFRNONAA 42*   < > 51* 47* 50*  GFRAA 49*   < > 59* 54* 58*  ANIONGAP 12   < > 9 11 11   < > = values in this interval not displayed.     Hematology Recent Labs  Lab 03/01/19 0355 03/03/19 0444 03/06/19 0318  WBC 9.7 12.0* 10.3  RBC 3.36* 3.31* 3.00*  HGB 8.2* 8.1* 7.3*  HCT 29.9* 29.4* 26.5*  MCV 89.0 88.8 88.3  MCH 24.4* 24.5* 24.3*  MCHC 27.4* 27.6* 27.5*  RDW 18.9* 19.2* 19.7*  PLT 249 210 211     Radiology    Dg Abd 1 View  Result Date: 03/06/2019 CLINICAL DATA:  Ileus EXAM: ABDOMEN - 1 VIEW COMPARISON:  Yesterday FINDINGS: Feeding tube tip is at the pylorus. Gastric suction tube is at the stomach body. Haziness of the lower chest from pulmonary opacity and layering pleural effusions. The visualized bowel gas pattern is normal. Arterial calcification. IMPRESSION: Feeding tube tip at the pylorus and gastric  suction tube tip at the gastric body-stable from yesterday Electronically Signed   By: Jonathon  Watts M.D.   On: 03/06/2019 05:21   Dg Chest Port 1 View  Result Date: 03/06/2019 CLINICAL DATA:  Pulmonary edema EXAM: PORTABLE CHEST 1 VIEW COMPARISON:  Three days ago FINDINGS: Haziness of the bilateral chest with layering pleural effusions. There is also lung opacity which could be infection or edema. Tracheostomy tube in place. The enteric tubes reache the stomach. Right PICC with tip at the upper cavoatrial junction. No pneumothorax. IMPRESSION: 1. Unremarkable hardware positioning. 2. Unchanged layering pleural effusions and pulmonary opacification. Electronically Signed   By: Jonathon  Watts M.D.   On: 03/06/2019 05:37   Dg Abd Portable 1v  Result Date: 03/05/2019 CLINICAL DATA:  NG tube placement EXAM: PORTABLE ABDOMEN - 1 VIEW COMPARISON:  02/24/2019 FINDINGS: NG tube is in the mid stomach. Feeding tube has been placed with the tip in the distal stomach or proximal duodenum. Nonobstructive bowel gas pattern. Diffuse airspace disease throughout the lungs with layering effusions. IMPRESSION: NG tube remains in the mid stomach. Feeding tube tip is in the peri pyloric region. Electronically Signed   By: Kevin  Dover M.D.   On: 03/05/2019 11:15    Cardiac Studies   Echocardiogram March 2020-  1. The left ventricle has moderately reduced systolic function, with an ejection fraction of 35-40%. The cavity size was normal. Left ventricular diastolic Doppler parameters are consistent with pseudonormalization. Elevated left ventricular end-diastolic pressure The E/e' is 27. Regional wall motion abnormalities (see coded diagram below).  2. The right ventricle has mildly reduced systolic function. The cavity was normal. There is no increase in right ventricular wall thickness.  3. No hemodynamically significant valvular heart disease.  4. Normal biatrial chamber size.  5. IVC not well visualized.  Cardiac  catheterization March 2020-  2nd Mrg-3 lesion is   80% stenosed.  Acute Mrg lesion is 90% stenosed.    Less than 25% left main  Proximal diffuse 30 to 40% narrowing within the previously placed LAD stent.  The entire apical LAD is diffusely diseased with up to 85% stenosis.  The first diagonal contains 85% ostial narrowing.  Circumflex contains diffuse proximal to mid eccentric 50% stenosis the dominant second obtuse marginal contains mid vessel 50% stenosis in the mid to distal portion of this obtuse marginal there is a patent stent with 20% narrowing, and just distal to the stent there is an 80% thrombotic stenosis.  The RCA is dominant containing 50% mid vessel stenosis, 70% distal stenosis, 70% ostial PDA stenosis, and total occlusion of the mid vessel that fills by collaterals from the left ventricular apex.  Left ventriculography is not performed.  Today's echo was reviewed and there is anteroapical severe hypokinesis and estimated ejection fraction of 40%.  LVEDP by Cath Lab hemodynamics is 40 mmHg.  RECOMMENDATIONS:   Acute on chronic combined systolic and diastolic heart failure with extremely elevated LVEDP greater than or equal to 40 mmHg related to global ischemia in the setting of norepinephrine therapy.  Recommend clopidogrel therapy if possible given stents in multiple territories and evidence of active thrombus beyond the stent in the obtuse marginal.  Current anatomy does not suggest that a role for coronary intervention.  35 cc of contrast used.  Given diabetes/shock state/intrinsic kidney disease, a bump in creatinine is still highly likely.  Kidney function needs to be monitored.  Patient Profile     73 y.o. female visiting from [place] admitted March 10 with fever, hypotension and found to have group A strep bacteremia requiring intubation.  Also ruled in for non-ST elevation myocardial infarction. Had cath and medical therapy recommended. Blood cultures March 10  showed group A strep.  Respiratory virus panel showed rhinovirus.  Influenza A and B negative.  Coronavirus negative.  Note LV dysfunction possibly secondary to stress cardiomyopathy.  Assessment & Plan    1 paroxysmal atrial fibrillation  Remainsi nSR on amidarone and coreg  Plan for no anticoagulation unless recurs.    May have been related to increased catechol stressors at admit   Follow      2  ANemia  Hg 7.3 today   Dark blood from NG I would hold ASA and plavix   No intervention    Used for NSTEMI    Follow closely   On protonix   3 NSTEMI/coronary artery disease-pull back on asa and plavix today   Watch H/H     4 cardiomyopathy-likely ischemic.     continue hydralazine/nitrates and coreg   Continue diuresis  Note written for 80 q 6 hours lasix  Follow Cr and K    5  Pulmonary   Trach        For questions or updates, please contact [facility] Please consult [email or web address] for contact info under        Signed, Dorris Carnes, MD  03/06/2019, 7:42 AM

## 2019-03-06 NOTE — Clinical Social Work Note (Signed)
[facility]- confirmed no vent SNF beds avaliable [facility]- confirmed no vent SNF beds [facility]- confirmed they have bed availability- referral sent.  CSW will continue to search for Vent SNF bed.  [place], [place] [phone]

## 2019-03-07 LAB — BPAM RBC
Blood Product Expiration Date: 202004122359
ISSUE DATE / TIME: 202004091130
Unit Type and Rh: 9500

## 2019-03-07 LAB — TYPE AND SCREEN
ABO/RH(D): O POS
Antibody Screen: NEGATIVE
Unit division: 0

## 2019-03-07 LAB — RENAL FUNCTION PANEL
Albumin: 2.5 g/dL — ABNORMAL LOW (ref 3.5–5.0)
Anion gap: 15 (ref 5–15)
BUN: 43 mg/dL — ABNORMAL HIGH (ref 8–23)
CO2: 27 mmol/L (ref 22–32)
Calcium: 8.6 mg/dL — ABNORMAL LOW (ref 8.9–10.3)
Chloride: 100 mmol/L (ref 98–111)
Creatinine, Ser: 1.3 mg/dL — ABNORMAL HIGH (ref 0.44–1.00)
GFR calc Af Amer: 47 mL/min — ABNORMAL LOW (ref >60)
GFR calc non Af Amer: 41 mL/min — ABNORMAL LOW (ref >60)
Glucose, Bld: 131 mg/dL — ABNORMAL HIGH (ref 70–99)
Phosphorus: 5.2 mg/dL — ABNORMAL HIGH (ref 2.5–4.6)
Potassium: 3.8 mmol/L (ref 3.5–5.1)
Sodium: 142 mmol/L (ref 135–145)

## 2019-03-07 LAB — CBC
HCT: 31.8 % — ABNORMAL LOW (ref 36.0–46.0)
Hemoglobin: 9.2 g/dL — ABNORMAL LOW (ref 12.0–15.0)
MCH: 25.1 pg — ABNORMAL LOW (ref 26.0–34.0)
MCHC: 28.9 g/dL — ABNORMAL LOW (ref 30.0–36.0)
MCV: 86.6 fL (ref 80.0–100.0)
Platelets: 227 10*3/uL (ref 150–400)
RBC: 3.67 MIL/uL — ABNORMAL LOW (ref 3.87–5.11)
RDW: 18.6 % — ABNORMAL HIGH (ref 11.5–15.5)
WBC: 9 10*3/uL (ref 4.0–10.5)
nRBC: 0 % (ref 0.0–0.2)

## 2019-03-07 LAB — GLUCOSE, CAPILLARY
Glucose-Capillary: 123 mg/dL — ABNORMAL HIGH (ref 70–99)
Glucose-Capillary: 112 mg/dL — ABNORMAL HIGH (ref 70–99)
Glucose-Capillary: 191 mg/dL — ABNORMAL HIGH (ref 70–99)
Glucose-Capillary: 179 mg/dL — ABNORMAL HIGH (ref 70–99)

## 2019-03-07 LAB — PHOSPHORUS
Phosphorus: 5.8 mg/dL — ABNORMAL HIGH (ref 2.5–4.6)
Phosphorus: 6.2 mg/dL — ABNORMAL HIGH (ref 2.5–4.6)

## 2019-03-07 LAB — MAGNESIUM
Magnesium: 2.4 mg/dL (ref 1.7–2.4)
Magnesium: 2.3 mg/dL (ref 1.7–2.4)
Magnesium: 2.3 mg/dL (ref 1.7–2.4)

## 2019-03-07 MED ORDER — NON FORMULARY: 237.0000 mL | Freq: Four times a day (QID) | Status: DC

## 2019-03-07 MED ORDER — POTASSIUM CHLORIDE 20 MEQ/15ML (10%) PO SOLN
40.0000 meq | Freq: Two times a day (BID) | ORAL | Status: AC
  Administered 2019-03-07 (×2): 40 meq
  Filled 2019-03-07 (×2): qty 30

## 2019-03-07 MED ORDER — CARVEDILOL 3.125 MG PO TABS
3.1250 mg | ORAL_TABLET | Freq: Two times a day (BID) | ORAL | Status: DC
  Administered 2019-03-07 – 2019-03-21 (×24): 3.125 mg via NASOGASTRIC
  Filled 2019-03-07 (×24): qty 1

## 2019-03-07 MED ORDER — FUROSEMIDE 8 MG/ML PO SOLN: 80.0000 mg | Freq: Every day | ORAL | Status: DC

## 2019-03-07 MED ORDER — HYDRALAZINE HCL 10 MG PO TABS
10.0000 mg | ORAL_TABLET | Freq: Three times a day (TID) | ORAL | Status: DC
  Administered 2019-03-09 – 2019-03-21 (×27): 10 mg
  Filled 2019-03-07 (×32): qty 1

## 2019-03-07 MED ORDER — GLUCERNA PO LIQD
237.0000 mL | Freq: Four times a day (QID) | ORAL | Status: DC
  Administered 2019-03-07 – 2019-03-10 (×12): 237 mL
  Filled 2019-03-07 (×15): qty 237

## 2019-03-07 MED ORDER — FUROSEMIDE 10 MG/ML IJ SOLN
80.0000 mg | Freq: Four times a day (QID) | INTRAMUSCULAR | Status: AC
  Administered 2019-03-07 (×2): 80 mg via INTRAVENOUS
  Filled 2019-03-07 (×2): qty 8

## 2019-03-07 MED ORDER — LORAZEPAM 2 MG/ML IJ SOLN
0.5000 mg | INTRAMUSCULAR | Status: DC | PRN
  Administered 2019-03-08 – 2019-03-16 (×11): 0.5 mg via INTRAVENOUS
  Filled 2019-03-07 (×11): qty 1

## 2019-03-07 MED ORDER — PRO-STAT SUGAR FREE PO LIQD
30.0000 mL | Freq: Every day | ORAL | Status: DC
  Administered 2019-03-07 – 2019-03-18 (×12): 30 mL
  Filled 2019-03-07 (×11): qty 30

## 2019-03-07 MED ORDER — ASPIRIN EC 81 MG PO TBEC
81.0000 mg | DELAYED_RELEASE_TABLET | Freq: Every day | ORAL | Status: DC
  Administered 2019-03-07 – 2019-03-08 (×2): 81 mg via ORAL
  Filled 2019-03-07 (×3): qty 1

## 2019-03-07 MED ORDER — FUROSEMIDE 10 MG/ML PO SOLN: 80.0000 mg | Freq: Every day | ORAL | Status: DC

## 2019-03-07 NOTE — Progress Notes (Addendum)
Progress Note  Patient Name: Gabrielle Wright Date of Encounter: 03/07/2019  Primary Cardiologist: Minus Breeding, MD   Subjective   Resting in bed.  Appears comfortable    Inpatient Medications    Scheduled Meds: . sodium chloride   Intravenous Once  . amiodarone  200 mg Per Tube Daily  . atorvastatin  40 mg Per Tube q1800  . carvedilol  6.25 mg Per NG tube BID WC  . chlorhexidine gluconate (MEDLINE KIT)  15 mL Mouth Rinse BID  . Chlorhexidine Gluconate Cloth  6 each Topical Q0600  . clonazepam  0.5 mg Per Tube TID  . fentaNYL (SUBLIMAZE) injection  50 mcg Intravenous Once  . hydrALAZINE  10 mg Per Tube Q8H  . insulin aspart  0-24 Units Subcutaneous Q4H  . isosorbide dinitrate  10 mg Per Tube TID  . mouth rinse  15 mL Mouth Rinse 10 times per day  . multivitamin  15 mL Per Tube Daily  . pantoprazole (PROTONIX) IV  40 mg Intravenous Q12H  . polyethylene glycol  17 g Oral Daily  . psyllium  1 packet Per Tube Daily  . QUEtiapine  25 mg Per Tube QHS  . sodium chloride flush  10-40 mL Intracatheter Q12H   Continuous Infusions: . sodium chloride Stopped (03/04/19 0815)   PRN Meds: acetaminophen (TYLENOL) oral liquid 160 mg/5 mL, bisacodyl, dextrose, diphenhydrAMINE, hydrALAZINE, ipratropium-albuterol, lidocaine (PF), ondansetron (ZOFRAN) IV, sennosides, simethicone, sodium chloride flush   Vital Signs    Vitals:   03/07/19 0500 03/07/19 0600 03/07/19 0700 03/07/19 0734  BP: (!) 120/57 (!) 121/53 (!) 108/49 (!) 95/56  Pulse: 74 79 72 75  Resp: 18 16 14 15   Temp:      TempSrc:      SpO2: 97% 99% 98% 99%  Weight: 80.9 kg     Height: 5' 6"  (1.676 m)       Intake/Output Summary (Last 24 hours) at 03/07/2019 0744 Last data filed at 03/07/2019 0600 Gross per 24 hour  Intake 1442 ml  Output 3350 ml  Net -1908 ml   Last 3 Weights 03/07/2019 03/06/2019 03/05/2019  Weight (lbs) 178 lb 5.6 oz 179 lb 3.7 oz 177 lb 11.1 oz  Weight (kg) 80.9 kg 81.3 kg 80.6 kg      Telemetry     SR   Only - Personally Reviewed  ECG    No new - Personally Reviewed  Physical Exam   GEN: No acute distress.   Neck: tracheostomy- Vent Cardiac: RRR, no murmurs Respiratory: Clear to auscultation bilaterally. Anteriorly   GI: Soft, minimal tenderness    MS: No edema; No deformity. Neuro:  Nonfocal    Labs    Chemistry Recent Labs  Lab 03/04/19 0837 03/06/19 0318 03/07/19 0434  NA 143 140 142  K 3.4* 3.8 3.8  CL 105 101 100  CO2 27 28 27   GLUCOSE 130* 127* 131*  BUN 50* 44* 43*  CREATININE 1.17* 1.10* 1.30*  CALCIUM 7.5* 7.9* 8.6*  ALBUMIN  --   --  2.5*  GFRNONAA 47* 50* 41*  GFRAA 54* 58* 47*  ANIONGAP 11 11 15      Hematology Recent Labs  Lab 03/03/19 0444 03/06/19 0318 03/07/19 0434  WBC 12.0* 10.3 9.0  RBC 3.31* 3.00* 3.67*  HGB 8.1* 7.3* 9.2*  HCT 29.4* 26.5* 31.8*  MCV 88.8 88.3 86.6  MCH 24.5* 24.3* 25.1*  MCHC 27.6* 27.5* 28.9*  RDW 19.2* 19.7* 18.6*  PLT 210 211 227  Cardiac Enzymes Recent Labs  Lab 03/03/19 1521 03/03/19 2254 03/04/19 0452  TROPONINI 0.11* 0.12* 0.11*   No results for input(s): TROPIPOC in the last 168 hours.   BNP Recent Labs  Lab 03/03/19 0444  BNP 596.6*     DDimer No results for input(s): DDIMER in the last 168 hours.   Radiology    Dg Abd 1 View  Result Date: 03/06/2019 CLINICAL DATA:  Ileus EXAM: ABDOMEN - 1 VIEW COMPARISON:  Yesterday FINDINGS: Feeding tube tip is at the pylorus. Gastric suction tube is at the stomach body. Haziness of the lower chest from pulmonary opacity and layering pleural effusions. The visualized bowel gas pattern is normal. Arterial calcification. IMPRESSION: Feeding tube tip at the pylorus and gastric suction tube tip at the gastric body-stable from yesterday Electronically Signed   By: Monte Fantasia M.D.   On: 03/06/2019 05:21   Dg Chest Port 1 View  Result Date: 03/06/2019 CLINICAL DATA:  Pulmonary edema EXAM: PORTABLE CHEST 1 VIEW COMPARISON:  Three days ago FINDINGS:  Haziness of the bilateral chest with layering pleural effusions. There is also lung opacity which could be infection or edema. Tracheostomy tube in place. The enteric tubes reache the stomach. Right PICC with tip at the upper cavoatrial junction. No pneumothorax. IMPRESSION: 1. Unremarkable hardware positioning. 2. Unchanged layering pleural effusions and pulmonary opacification. Electronically Signed   By: Monte Fantasia M.D.   On: 03/06/2019 05:37   Dg Abd Portable 1v  Result Date: 03/05/2019 CLINICAL DATA:  NG tube placement EXAM: PORTABLE ABDOMEN - 1 VIEW COMPARISON:  02/24/2019 FINDINGS: NG tube is in the mid stomach. Feeding tube has been placed with the tip in the distal stomach or proximal duodenum. Nonobstructive bowel gas pattern. Diffuse airspace disease throughout the lungs with layering effusions. IMPRESSION: NG tube remains in the mid stomach. Feeding tube tip is in the peri pyloric region. Electronically Signed   By: Rolm Baptise M.D.   On: 03/05/2019 11:15    Cardiac Studies   Cardiac cath 02/09/19   2nd Mrg-3 lesion is 80% stenosed.  Acute Mrg lesion is 90% stenosed.    Less than 25% left main  Proximal diffuse 30 to 40% narrowing within the previously placed LAD stent.  The entire apical LAD is diffusely diseased with up to 85% stenosis.  The first diagonal contains 85% ostial narrowing.  Circumflex contains diffuse proximal to mid eccentric 50% stenosis the dominant second obtuse marginal contains mid vessel 50% stenosis in the mid to distal portion of this obtuse marginal there is a patent stent with 20% narrowing, and just distal to the stent there is an 80% thrombotic stenosis.  The RCA is dominant containing 50% mid vessel stenosis, 70% distal stenosis, 70% ostial PDA stenosis, and total occlusion of the mid vessel that fills by collaterals from the left ventricular apex.  Left ventriculography is not performed.  Today's echo was reviewed and there is anteroapical severe  hypokinesis and estimated ejection fraction of 40%.  LVEDP by Cath Lab hemodynamics is 40 mmHg.  RECOMMENDATIONS:   Acute on chronic combined systolic and diastolic heart failure with extremely elevated LVEDP greater than or equal to 40 mmHg related to global ischemia in the setting of norepinephrine therapy.  Recommend clopidogrel therapy if possible given stents in multiple territories and evidence of active thrombus beyond the stent in the obtuse marginal.  Current anatomy does not suggest that a role for coronary intervention.  35 cc of contrast used.  Given diabetes/shock  state/intrinsic kidney disease, a bump in creatinine is still highly likely.  Kidney function needs to be monitored.  02/09/2019 IMPRESSIONS    1. The left ventricle has moderately reduced systolic function, with an ejection fraction of 35-40%. The cavity size was normal. Left ventricular diastolic Doppler parameters are consistent with pseudonormalization. Elevated left ventricular  end-diastolic pressure The E/e' is 39. Regional wall motion abnormalities (see coded diagram below).  2. The right ventricle has mildly reduced systolic function. The cavity was normal. There is no increase in right ventricular wall thickness.  3. No hemodynamically significant valvular heart disease.  4. Normal biatrial chamber size.  5. IVC not well visualized.  FINDINGS  Left Ventricle: The left ventricle has moderately reduced systolic function, with an ejection fraction of 35-40%. The cavity size was normal. There is no increase in left ventricular wall thickness. Left ventricular diastolic Doppler parameters are  consistent with pseudonormalization. Elevated left ventricular end-diastolic pressure The E/e' is 70.  The entire apex, mid and apical anterior septum, and mid and apical inferior  septum are akinetic. The basal and mid anterior wall, mid inferolateral  segment, mid anterolateral segment, and mid inferior segment  are hypokinetic.  All remaining scored segments are normal.  02/20/19 IMPRESSIONS    1. The left ventricle has moderately reduced systolic function, with an ejection fraction of 35-40%. Left ventricular diastolic Doppler parameters are consistent with restrictive filling. Elevated left atrial and left ventricular end-diastolic pressures  The E/e' is 34. Left ventricular diffuse hypokinesis.  2. Severe akinesis of the left ventricular, entire apical segment.  3. Severe akinesis of the left ventricular, mid-apical anteroseptal wall and anterior wall.  4. Moderate pleural effusion in both left and right lateral regions.  5. There is mild mitral annular calcification present.  6. The aortic valve is grossly normal Aortic valve regurgitation is mild by color flow Doppler.  7. The inferior vena cava was normal in size with <50% respiratory variability.  8. Left atrial size was not assessed.  9. The ascending aorta and aortic root are normal in size and structure.  SUMMARY   Overall study is similar to prior, with EF 35-40%. Diffuse hypokinesis with focal WMA at entire apex as well as anteroseptal and anterior walls. E/A >3, E/E' >30 suggestive of elevated pressures and restrictive filling.  FINDINGS  Left Ventricle: The left ventricle has moderately reduced systolic function, with an ejection fraction of 35-40%. There is no increase in left ventricular wall thickness. Left ventricular diastolic Doppler parameters are consistent with restrictive  filling (grade III). Elevated left atrial and left ventricular end-diastolic pressures The E/e' is 34. Left ventricular diffuse hypokinesis. Severe akinesis of the left ventricular, mid-apical anteroseptal wall and anterior wall. Severe akinesis of the  left ventricular, entire apical segment. Right Ventricle: The right ventricle was not assessed. The cavity was not assessed. There is right vetricular wall thickness was not assessed. Left Atrium:  Left atrial size was not assessed. Right Atrium: Right atrial size was not assessed. Right atrial pressure is estimated at 8 mmHg. Pericardium: There is no evidence of pericardial effusion. There is a moderate pleural effusion in both left and right lateral regions. Mitral Valve: The mitral valve is normal in structure. There is mild mitral annular calcification present. Mitral valve regurgitation is trivial by color flow Doppler. Tricuspid Valve: The tricuspid valve was normal in structure. Tricuspid valve regurgitation is trivial by color flow Doppler. Aortic Valve: The aortic valve is grossly normal Aortic valve regurgitation is mild  by color flow Doppler. Pulmonic Valve: The pulmonic valve was grossly normal. Pulmonic valve regurgitation is trivial by color flow Doppler. Aorta: The ascending aorta and aortic root are normal in size and structure. Pulmonary Artery: The pulmonary artery is not well seen. Venous: The inferior vena cava is normal in size with less than 50% respiratory variability.   LEFT VENTRICLE PLAX 2D LVIDd:         4.70 cm       Diastology LVIDs:         3.90 cm       LV e' lateral:   5.00 cm/s LV PW:         1.10 cm       LV E/e' lateral: 31.0 LV IVS:        1.20 cm       LV e' medial:    4.24 cm/s LV SV:         36 ml         LV E/e' medial:  36.6 LV SV Index:   18.80    Right Ventricle: The right ventricle has mildly reduced systolic function. The cavity was normal. There is no increase in right ventricular wall thickness. Left Atrium: left atrial size was normal in size Right Atrium: right atrial size was normal in size. Interatrial Septum: No atrial level shunt detected by color flow Doppler. Pericardium: There is no evidence of pericardial effusion. Mitral Valve: The mitral valve is abnormal. There is mild mitral annular calcification present. Mitral valve regurgitation is trivial by color flow Doppler. Tricuspid Valve: The tricuspid valve is normal in  structure. Tricuspid valve regurgitation is trivial by color flow Doppler. Aortic Valve: The aortic valve is grossly normal Aortic valve regurgitation is trivial by color flow Doppler. Indeterminate number of aortic valve cusps due to image quality. Pulmonic Valve: The pulmonic valve was not well visualized. Pulmonic valve regurgitation is trivial by color flow Doppler. Aorta: The aortic root is normal in size and structure. Venous: The inferior vena cava was not well visualized. Patient is intubated on mechanical ventilator.    Patient Profile     73 y.o. female visiting from [place] admitted March 10 with fever, hypotension and found to have group A strep bacteremia requiring intubation.  Also ruled in for non-ST elevation myocardial infarction. Had cath and medical therapy recommended. Blood cultures March 10 showed group A strep.  Respiratory virus panel showed rhinovirus.  Influenza A and B negative.  Coronavirus negative.  Note LV dysfunction possibly secondary to stress cardiomyopathy.  Yesterday with drop in Hgb to 7.3 and transfused.   Assessment & Plan    PAF remaining in SR on amiodarone and coreg.  No anticoagulation unless a fib recurs. Currently on 200 mg  Amiodarone .   Keep on this for now  .   Given anemia would like to avoid anticoaguation    NSTEMI   Cath as noted above      Pt was on ASA and Plavix   I held it yesterday when Hgb dropped   PT got  PRBC   Would resume ASA for now   Not optimal but need to follow Hgb   Anemia- acute blood loss yesterday with darl blood from NG.  Pt transfused   Hgb is relatively stable   On PPI  WIll resume ec ASA   Hold plavix    Follow CBC    Systolic and diastolic HF  COntinues to diurese with lasix  Improved from earlier in week       Cut back on coreg dose   BP soft ear;ier   Keep on hydralazine and NTG (Cr labile)  CAD  NSTEMI    Cath as noted above   Diffuse dz noted above No intervention done .   Will resume ASA    Worrisome she  will not tolerate plavix given drop in Hgb yesterday  Follow  .     Acute respiratory failure with mechanical ventilation and trach 3/31  --strept bacteremia shock  Followed by CCM       Hypernatremia  --+ free water per tube   Resolved        For questions or updates, please contact [facility] Please consult [email or web address] for contact info under        Signed, Cecilie Kicks, NP  03/07/2019, 7:44 AM

## 2019-03-07 NOTE — Progress Notes (Addendum)
NAME:  Gabrielle Wright, MRN:  [id number], DOB:  1946/10/20, LOS: 31 ADMISSION DATE:  02/03/2019, CONSULTATION DATE: 02/04/2019 REFERRING MD: Emergency department physician CHIEF COMPLAINT: Fever respiratory failure  Brief History   73 year old elderly woman visiting from [place], admitted 3/10 with fevers, hypotension, found to have group A strep bacteremia, required mechanical ventilation Course complicated by non-STEMI and new LV dysfunction ? Ischemic vs Takatsubo's Failed extubation 3/18 due to acute pulmonary edema.  Past Medical History  Hypertension Coronary artery disease Suspected diabetes.  Significant Hospital Is Events   02/04/2019 transfer from [facility] required intubation and pressors. 3/16 off levophed 3/18 extubated briefly but reintubated due to pulmonary edema 3/19 started milrinone 3/20 Episode of respiratory distress,  Precedex changed to propofol Atrial fibrillation RVR >>amiodarone bolus >> sinus rhythm 3/21 rising creat , AF-RVR >> amio bolus 3/24:rising creatinine 3/30: reintubated 3/31 trach 4/1 PICC  Consults:  02/04/2019 ID  IR  Cardiology  Procedures:  ETT 3/10 > 3/18, 3/18 > 3/31 Trach 3/31 > CVL 3/10 > 4/3 PICC 4/1 >   Significant Diagnostic Tests:  Echo 3/15 decreased EF 35 to 40%, LVEDP 27, no significant MR Cardiac cath >> LVEDP 40, RCA totally occluded, patent OM1 stent but distally occluded, apical LAD diffusely diseased, no intervention Chest x-ray 3/22  bilateral lower lobe airspace disease versus layering effusions CXR 3/23> pulmonary edema and bilateral pleural effusions. Echo 3/26 limited> EF 35-40%, elevated LA and LV end-diastolic pressures, Diffuse hypokinesis of LV CXR 4/1> bibasilar opacities, small R pleural effusion, tracheostomy tube present and appropriately positioned  CXR 4/9> bilateral infiltrates with R pleural effusion  Micro Data:  02/04/2019 blood cultures x2>> Gr A strep 02/04/2019  sputum culture>> neg 02/04/2019 urine culture>>neg 02/04/2019 respiratory virus panel>> rhinovirus 02/04/2019 flu a and B>> neg coronavirus testing 02/04/2019>>neg resp 3/21 >> Klebsiella oxytoca, co-ag negative staph  Antimicrobials:  02/04/2019 vancomycin>>3/ 10 02/04/2019 Zosyn>> 3/10 Pen G 3/10 >> 3/22 (plan) Ceftx 3/20 >>3/24 Cipro 3/24>> 3/31  Interim history/subjective:  No significant weaning again. Diuresed 4L, with net neg 2L; weight still ~5kg above admission  Objective   Blood pressure (!) 95/56, pulse 75, temperature 97.6 F (36.4 C), temperature source Oral, resp. rate 15, height 5\' 6"  (1.676 m), weight 80.9 kg, SpO2 99 %. CVP:  [3 mmHg-12 mmHg] 10 mmHg  Vent Mode: CPAP;PSV FiO2 (%):  [30 %] 30 % Set Rate:  [12 bmp] 12 bmp Vt Set:  [450 mL] 450 mL PEEP:  [5 cmH20] 5 cmH20 Pressure Support:  [10 cmH20] 10 cmH20 Plateau Pressure:  [20 cmH20-23 cmH20] 20 cmH20   Intake/Output Summary (Last 24 hours) at 03/07/2019 0844 Last data filed at 03/07/2019 0600 Gross per 24 hour  Intake 942 ml  Output 3350 ml  Net -2408 ml   Filed Weights   03/05/19 0842 03/06/19 0350 03/07/19 0500  Weight: 80.6 kg 81.3 kg 80.9 kg   Examination:  General: adult female on vent Neuro: Wakes to voice HEENT: [place]/AT. No JVD. Trach in place.  Cardiovascular: RRR, no MRG.  Lungs: Clear. Scattered rhonchi Abdomen:  soft, distended, non tender  Musculoskeletal: No gross deformities, no edema.  Skin: Grossly intact.   Assessment & Plan:   Acute respiratory failure requiring mechanical ventilation - s/p trach 3/31. - RN reports having trouble with weaning; CXR with persistent pulm edema and net neg yesterday. CXR 4/9 with persistent pulm edema R>L with pleural effusion - continue diuresis with lasix 80mg  BID - wean  as able.   Ischemic Cardiomyopathy (Echo 3/26 EF 35-40%, LV diffuse hypokinesis) - no targets for revascularization on cath - Cardiology following. - Continue coreg,  hydralazine, imdur; holding asa, plavix 2/2 Hgb drop and ?bloody NG output  Anemia: Report of mild bloody NG output and Hgb drop yesterday from 8.1>7.3; s/p 1u pRBC per primary with Hgb 9.2 this AM. Workup per primary; on IV PPI, holding ASA and plavix for now   Atrial Fibrillation with RVR. - Continue amio, coreg.  Diabetes type 2/uncontrolled hyperglycemia. - SSI resistant.  Intermittent agitation - exacerbated by language barrier (family video conference has seemed to help). Wonder how much of this is simply deconditioning.  - Continue clonazepam - Seroquel QHS - PT to work with patient.  - Establish good sleep wake cycle.   Dysphagia:  - Continue TF Cortrak. May need to consider PEG over the next few days.   CM on board for LTAC options.   GI ppx: Protonix VAP: per protocol Pain/Sedation: PRN fentanyl / PRN clonazepam DVT ppx: SCDs / Heparin on hold Diet: Enteral Nutrition Code Status: full   Nyra Market, MD PGY3 Pager [phone]  03/07/2019 8:44 AM

## 2019-03-07 NOTE — Progress Notes (Signed)
PROGRESS NOTE    Gabrielle Wright  DHR:[id number] DOB: 1946-09-05 DOA: 02/03/2019 PCP: Rudene Anda, MD    Brief Narrative:  73yo female with hx of HTN, possible DM, CAD presented as a transfer to critical care service with group A strep bacteremia requiring intubation and mechanical ventillation. Complicated by NSTEMI and systolic dysfunction. Pt noted to have difficulty with extubation secondary to volume overload. 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 Acute respiratory failure s/p trach 1. Had remained intubated, difficult to wean secondary to volume overload 2. Continuing to diurese as tolerated 3. Currently on IV lasix 20m q6hrs 4. Over 3000cc urine out over past 24hrs 5. Repeat bmet in AM 2. Hypernatremia 1. Resolved 2. Receiving free water flushes 3. Ischemic cardiomyopathy 1. Cardiology following 2. ASA and plavix presently on hold 3. Appears stable at present 4. Afib RVR 1. Cardiology following 2. Rate controlled on amiodarone and coreg 3. Recommendations for no anticoagulation unless afib recurs 5. DM2 1. Glucose stable 2. Continue SSI coverage as needed 6. Dysphagia 1. Currently receiving nutrition through cortrak 2. Nutrition following  DVT prophylaxis: SCD's Code Status: Full Family Communication: Pt in room, family not at bedside Disposition Plan: Uncertain, hopeful SNF  Consultants:   Critical Care  Cardiology  Procedures:   Heart  Cath 3/15  Extubated 3/18  Re-intubated 3/30  Trach 3/31  Antimicrobials: Anti-infectives (From admission, onward)   Start     Dose/Rate Route Frequency Ordered Stop   02/19/19 1200  ciprofloxacin (CIPRO) IVPB 400 mg     400 mg 200 mL/hr over 60 Minutes Intravenous Every 12 hours 02/19/19 1119 02/25/19 1321   02/18/19 1230  ciprofloxacin (CIPRO) IVPB 400 mg  Status:  Discontinued     400 mg 200 mL/hr over 60 Minutes Intravenous Every 24 hours 02/18/19 1127 02/19/19 1119   02/14/19 0900  cefTRIAXone (ROCEPHIN) 2 g in sodium chloride 0.9 % 100 mL IVPB     2 g 200 mL/hr over 30 Minutes Intravenous Every 24 hours 02/14/19 0846 02/16/19 1033   02/06/19 1900  penicillin G potassium 8 Million Units in dextrose 5 % 500 mL continuous infusion  Status:  Discontinued     8 Million Units 41.7 mL/hr over 12 Hours Intravenous Every 12 hours 02/06/19 1900 02/14/19 0846   02/06/19 1100  penicillin G potassium 8 Million Units in dextrose 5 % 500 mL continuous infusion  Status:  Discontinued     8 Million Units 41.7 mL/hr over 12 Hours Intravenous Every 12 hours 02/06/19 1008 02/06/19 1900   02/05/19 2300  penicillin G potassium 4 Million Units in dextrose 5 % 250 mL IVPB  Status:  Discontinued     4 Million Units 250 mL/hr over 60 Minutes Intravenous Every 6 hours 02/04/19 2216 02/04/19 2237   02/04/19 2300  vancomycin (VANCOCIN) 1,500 mg in sodium chloride 0.9 % 500 mL IVPB     1,500 mg 250 mL/hr over 120 Minutes Intravenous  Once 02/04/19 2216 02/05/19 0308   02/04/19 2300  penicillin G potassium 4 Million Units in dextrose 5 % 250 mL IVPB  Status:  Discontinued     4 Million Units 250 mL/hr over 60 Minutes Intravenous Every 6 hours 02/04/19 2237 02/06/19 1008   02/04/19 2230  clindamycin (CLEOCIN) IVPB 600 mg  Status:  Discontinued     600 mg 100 mL/hr over 30 Minutes Intravenous Every 8 hours 02/04/19 2216 02/05/19 0917   02/04/19 0315  piperacillin-tazobactam (ZOSYN) IVPB 3.375 g     3.375  g 100 mL/hr over 30 Minutes Intravenous  Once 02/04/19 0303 02/04/19 0408   02/04/19 0315  vancomycin (VANCOCIN) IVPB 1000 mg/200 mL premix     1,000 mg 200 mL/hr over 60 Minutes Intravenous  Once 02/04/19 0303 02/04/19 0424       Subjective: Trached, unable to obtain  Objective: Vitals:   03/07/19 1108 03/07/19 1200 03/07/19 1300 03/07/19 1400  BP: (!) 93/48 (!) 95/49 (!) 94/51 (!) 109/52  Pulse: 64 66 62 65  Resp: 17 (!) 21 16 (!) 23  Temp: (!) 97.5 F (36.4 C)     TempSrc: Oral     SpO2: 98% 100% 100% 100%  Weight:      Height:        Intake/Output Summary (Last 24 hours) at 03/07/2019 1504 Last data filed at 03/07/2019 1000 Gross per 24 hour  Intake 370 ml  Output 2150 ml  Net -1780 ml   Filed Weights   03/05/19 0842 03/06/19 0350 03/07/19 0500  Weight: 80.6 kg 81.3 kg 80.9 kg    Examination:  General exam: Appears calm and comfortable, trach in place Respiratory system: Clear to auscultation. Respiratory effort normal. Cardiovascular system: S1 & S2 heard, RRR Gastrointestinal system: Abdomen is nondistended, soft and nontender. No organomegaly or masses felt. Normal bowel sounds heard, NG tube/cortrak in place Central nervous system: Alert and oriented. No focal neurological deficits. Extremities: Symmetric 5 x 5 power. Skin: No rashes, lesions Psychiatry: Unable to assess given current level of mentation  Data Reviewed: I have personally reviewed following labs and imaging studies  CBC: Recent Labs  Lab 03/01/19 0355 03/03/19 0444 03/06/19 0318 03/07/19 0434  WBC 9.7 12.0* 10.3 9.0  NEUTROABS  --  8.9* 7.4  --   HGB 8.2* 8.1* 7.3* 9.2*  HCT 29.9* 29.4* 26.5* 31.8*  MCV 89.0 88.8 88.3 86.6  PLT 249 210 211 662   Basic Metabolic Panel: Recent Labs  Lab 03/01/19 0355 03/03/19 0444 03/04/19 0837 03/06/19 0318 03/07/19 0434 03/07/19 1214  NA 147* 141 143 140 142  --   K 3.6 3.7 3.4* 3.8 3.8  --   CL 108 103 105 101 100  --   CO2 31 29 27 28  27   --   GLUCOSE 182* 223* 130* 127* 131*  --   BUN 63* 50* 50* 44* 43*  --   CREATININE 1.12* 1.09* 1.17* 1.10* 1.30*  --   CALCIUM 8.3* 7.9* 7.5* 7.9* 8.6*  --   MG 2.7*  --   --  2.8* 2.4 2.3  PHOS 3.6  --   --   --  5.2* 5.8*   GFR: Estimated Creatinine Clearance: 41.9 mL/min (A) (by C-G formula based on SCr of 1.3 mg/dL (H)). Liver Function Tests: Recent Labs  Lab 03/07/19 0434  ALBUMIN 2.5*   No results for input(s): LIPASE, AMYLASE in the last 168 hours. No results for input(s): AMMONIA in the last 168  hours. Coagulation Profile: No results for input(s): INR, PROTIME in the last 168 hours. Cardiac Enzymes: Recent Labs  Lab 03/03/19 1521 03/03/19 2254 03/04/19 0452  TROPONINI 0.11* 0.12* 0.11*   BNP (last 3 results) No results for input(s): PROBNP in the last 8760 hours. HbA1C: No results for input(s): HGBA1C in the last 72 hours. CBG: Recent Labs  Lab 03/06/19 1635 03/06/19 1924 03/06/19 2339 03/07/19 0341 03/07/19 0741  GLUCAP 134* 144* 145* 123* 112*   Lipid Profile: No results for input(s): CHOL, HDL, LDLCALC, TRIG, CHOLHDL, LDLDIRECT in the last 72 hours. Thyroid Function Tests: No results for input(s): TSH, T4TOTAL, FREET4, T3FREE, THYROIDAB in the last 72 hours. Anemia Panel: No results for input(s): VITAMINB12, FOLATE, FERRITIN, TIBC, IRON, RETICCTPCT in the last 72 hours. Sepsis Labs: No results for input(s): PROCALCITON, LATICACIDVEN in the last 168 hours.  No results found for this or any previous visit (from the past 240 hour(s)).   Radiology Studies: Dg Abd 1 View  Result Date: 03/06/2019 CLINICAL DATA:  Ileus EXAM: ABDOMEN - 1 VIEW COMPARISON:  Yesterday FINDINGS: Feeding tube tip is at the pylorus. Gastric suction tube is at the stomach body. Haziness of the lower chest from pulmonary opacity and layering pleural effusions. The visualized bowel gas pattern is normal. Arterial calcification. IMPRESSION: Feeding tube tip at the pylorus and  gastric suction tube tip at the gastric body-stable from yesterday Electronically Signed   By: Monte Fantasia M.D.   On: 03/06/2019 05:21   Dg Chest Port 1 View  Result Date: 03/06/2019 CLINICAL DATA:  Pulmonary edema EXAM: PORTABLE CHEST 1 VIEW COMPARISON:  Three days ago FINDINGS: Haziness of the bilateral chest with layering pleural effusions. There is also lung opacity which could be infection or edema. Tracheostomy tube in place. The enteric tubes reache the stomach. Right PICC with tip at the upper cavoatrial junction. No pneumothorax. IMPRESSION: 1. Unremarkable hardware positioning. 2. Unchanged layering pleural effusions and pulmonary opacification. Electronically Signed   By: Monte Fantasia M.D.   On: 03/06/2019 05:37    Scheduled Meds: . sodium chloride   Intravenous Once  . amiodarone  200 mg Per Tube Daily  . atorvastatin  40 mg Per Tube q1800  . carvedilol  3.125 mg Per NG tube BID WC  . chlorhexidine gluconate (MEDLINE KIT)  15 mL Mouth Rinse BID  . Chlorhexidine Gluconate Cloth  6 each Topical Q0600  . clonazepam  0.5 mg Per Tube TID  . feeding supplement (PRO-STAT SUGAR FREE 64)  30 mL Per Tube Daily  . fentaNYL (SUBLIMAZE) injection  50 mcg Intravenous Once  . furosemide  80 mg Intravenous Q6H  . Glucerna  237 mL Per Tube QID  . hydrALAZINE  10 mg Per Tube Q8H  . insulin aspart  0-24 Units Subcutaneous Q4H  . isosorbide dinitrate  10 mg Per Tube TID  . mouth rinse  15 mL Mouth Rinse 10 times per day  . multivitamin  15 mL Per Tube Daily  . pantoprazole (PROTONIX) IV  40 mg Intravenous Q12H  . polyethylene glycol  17 g Oral Daily  . potassium chloride  40 mEq Per Tube BID  . psyllium  1 packet Per Tube Daily  . QUEtiapine  25 mg Per Tube QHS  . sodium chloride flush  10-40 mL Intracatheter Q12H   Continuous Infusions: . sodium chloride Stopped (03/04/19 0815)     LOS: 31 days   Marylu Lund, MD [facility] Pager On Amion  If 7PM-7AM,  please contact  night-coverage 03/07/2019, 3:04 PM

## 2019-03-07 NOTE — Progress Notes (Signed)
Tele visit with patient and her daughter today. Daughter was able to forward link to other daughters out of the country to visit as well.

## 2019-03-07 NOTE — Progress Notes (Signed)
Daughter states that her mother is not capable of making decisions at this time. She would like to be called if any decisions are to be made. The daughter's name is Mcarthur Rossetti @ [phone].

## 2019-03-08 LAB — MAGNESIUM
Magnesium: 2.3 mg/dL (ref 1.7–2.4)
Magnesium: 2.5 mg/dL — ABNORMAL HIGH (ref 1.7–2.4)

## 2019-03-08 LAB — GLUCOSE, CAPILLARY
Glucose-Capillary: 116 mg/dL — ABNORMAL HIGH (ref 70–99)
Glucose-Capillary: 155 mg/dL — ABNORMAL HIGH (ref 70–99)
Glucose-Capillary: 158 mg/dL — ABNORMAL HIGH (ref 70–99)
Glucose-Capillary: 168 mg/dL — ABNORMAL HIGH (ref 70–99)
Glucose-Capillary: 177 mg/dL — ABNORMAL HIGH (ref 70–99)
Glucose-Capillary: 205 mg/dL — ABNORMAL HIGH (ref 70–99)
Glucose-Capillary: 221 mg/dL — ABNORMAL HIGH (ref 70–99)
Glucose-Capillary: 87 mg/dL (ref 70–99)

## 2019-03-08 LAB — CBC
HCT: 34.2 % — ABNORMAL LOW (ref 36.0–46.0)
Hemoglobin: 9.9 g/dL — ABNORMAL LOW (ref 12.0–15.0)
MCH: 25.2 pg — ABNORMAL LOW (ref 26.0–34.0)
MCHC: 28.9 g/dL — ABNORMAL LOW (ref 30.0–36.0)
MCV: 87 fL (ref 80.0–100.0)
Platelets: 289 10*3/uL (ref 150–400)
RBC: 3.93 MIL/uL (ref 3.87–5.11)
RDW: 18.6 % — ABNORMAL HIGH (ref 11.5–15.5)
WBC: 9.7 10*3/uL (ref 4.0–10.5)
nRBC: 0 % (ref 0.0–0.2)

## 2019-03-08 LAB — RENAL FUNCTION PANEL
Albumin: 2.5 g/dL — ABNORMAL LOW (ref 3.5–5.0)
Anion gap: 11 (ref 5–15)
BUN: 46 mg/dL — ABNORMAL HIGH (ref 8–23)
CO2: 26 mmol/L (ref 22–32)
Calcium: 8.8 mg/dL — ABNORMAL LOW (ref 8.9–10.3)
Chloride: 102 mmol/L (ref 98–111)
Creatinine, Ser: 1.36 mg/dL — ABNORMAL HIGH (ref 0.44–1.00)
GFR calc Af Amer: 45 mL/min — ABNORMAL LOW (ref >60)
GFR calc non Af Amer: 39 mL/min — ABNORMAL LOW (ref >60)
Glucose, Bld: 92 mg/dL (ref 70–99)
Phosphorus: 5.4 mg/dL — ABNORMAL HIGH (ref 2.5–4.6)
Potassium: 4.3 mmol/L (ref 3.5–5.1)
Sodium: 139 mmol/L (ref 135–145)

## 2019-03-08 LAB — PHOSPHORUS: Phosphorus: 5.6 mg/dL — ABNORMAL HIGH (ref 2.5–4.6)

## 2019-03-08 MED ORDER — LACTULOSE 10 GM/15ML PO SOLN
10.0000 g | ORAL | Status: AC
  Administered 2019-03-08: 10:00:00 10 g via ORAL
  Filled 2019-03-08: qty 15

## 2019-03-08 NOTE — Progress Notes (Signed)
PROGRESS NOTE    Gabrielle Wright  IOM:[id number] DOB: 1946/07/27 DOA: 02/03/2019 PCP: Rudene Anda, MD    Brief Narrative:  73yo female with hx of HTN, possible DM, CAD presented as a transfer to critical care service with group A strep bacteremia requiring intubation and mechanical ventillation. Complicated by NSTEMI and systolic dysfunction. Pt noted to have difficulty with extubation secondary to volume overload. 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 Acute respiratory failure s/p trach 1. Had remained intubated, difficult to wean secondary to volume overload 2. Continue to diurese 3. Recently given IV lasix 25m q6hrs 4. Over 1300cc urine out over past 24hrs 5. Recheck bmet in AM 2. Hypernatremia 1. Resolved 2. Receiving free water flushes 3. Repeat bmet in AM 3. Ischemic cardiomyopathy 1. Cardiology had been following, since signed off 2. ASA and  plavix currently on hold 3. Remains stable 4. Afib RVR 1. Cardiology had been following, now signed off 2. Rate controlled on amiodarone and coreg 3. Recommendations for no anticoagulation unless afib recurs. Stable at present 5. DM2 1. Glucose stable 2. Continue with SSI coverage as needed 6. Dysphagia 1. Currently receiving nutrition through cortrak 2. Nutrition had been following  DVT prophylaxis: SCD's Code Status: Full Family Communication: Pt in room, family not at bedside Disposition Plan: Uncertain,  hopeful SNF  Consultants:   Critical Care  Cardiology  Procedures:   Heart Cath 3/15  Extubated 3/18  Re-intubated 3/30  Trach 3/31  Antimicrobials: Anti-infectives (From admission, onward)   Start     Dose/Rate Route Frequency Ordered Stop   02/19/19 1200  ciprofloxacin (CIPRO) IVPB 400 mg     400 mg 200 mL/hr over 60 Minutes Intravenous Every 12 hours 02/19/19 1119 02/25/19 1321   02/18/19 1230  ciprofloxacin (CIPRO) IVPB 400 mg  Status:  Discontinued     400 mg 200 mL/hr over 60 Minutes Intravenous Every 24 hours 02/18/19 1127 02/19/19 1119   02/14/19 0900  cefTRIAXone (ROCEPHIN) 2 g in sodium chloride 0.9 % 100 mL IVPB     2 g 200 mL/hr over 30 Minutes Intravenous Every 24 hours 02/14/19 0846 02/16/19 1033   02/06/19 1900  penicillin G potassium 8 Million Units in dextrose 5 % 500 mL continuous infusion  Status:  Discontinued     8 Million Units 41.7 mL/hr over 12 Hours Intravenous Every 12 hours 02/06/19 1900 02/14/19 0846   02/06/19 1100  penicillin G potassium 8 Million Units in dextrose 5 % 500 mL continuous infusion  Status:  Discontinued     8 Million Units 41.7 mL/hr over 12 Hours Intravenous Every 12 hours 02/06/19 1008 02/06/19 1900   02/05/19 2300  penicillin G potassium 4 Million Units in dextrose 5 % 250 mL IVPB  Status:  Discontinued     4 Million Units 250 mL/hr over 60 Minutes Intravenous Every 6 hours 02/04/19 2216 02/04/19 2237   02/04/19 2300  vancomycin (VANCOCIN) 1,500 mg in sodium chloride  0.9 % 500 mL IVPB     1,500 mg 250 mL/hr over 120 Minutes Intravenous  Once 02/04/19 2216 02/05/19 0308   02/04/19 2300  penicillin G potassium 4 Million Units in dextrose 5 % 250 mL IVPB  Status:  Discontinued     4 Million Units 250 mL/hr over 60 Minutes Intravenous Every 6 hours 02/04/19 2237 02/06/19 1008   02/04/19 2230  clindamycin (CLEOCIN) IVPB 600 mg  Status:  Discontinued     600 mg 100 mL/hr over 30 Minutes Intravenous Every 8 hours 02/04/19 2216  02/05/19 0917   02/04/19 0315  piperacillin-tazobactam (ZOSYN) IVPB 3.375 g     3.375 g 100 mL/hr over 30 Minutes Intravenous  Once 02/04/19 0303 02/04/19 0408   02/04/19 0315  vancomycin (VANCOCIN) IVPB 1000 mg/200 mL premix     1,000 mg 200 mL/hr over 60 Minutes Intravenous  Once 02/04/19 0303 02/04/19 0424      Subjective: Remains trached, unable to obtain  Objective: Vitals:   03/08/19 0800 03/08/19 0900 03/08/19 0910 03/08/19 1000  BP: (!) 109/53 112/68 112/68 (!) 98/43  Pulse: 91 89 82 84  Resp: 19 (!) 25  20  Temp:      TempSrc:      SpO2: 100% 99%  97%  Weight:      Height:        Intake/Output Summary (Last 24 hours) at 03/08/2019 1055 Last data filed at 03/08/2019 0940 Gross per 24 hour  Intake 970 ml  Output 1300 ml  Net -330 ml   Filed Weights   03/06/19 0350 03/07/19 0500 03/08/19 0400  Weight: 81.3 kg 80.9 kg 73.9 kg    Examination: General exam: Awake, laying in bed, in nad Respiratory system: Normal respiratory effort, no wheezing Cardiovascular system: regular rate, s1, s2 Gastrointestinal system: Soft, nondistended, positive BS Central nervous system: CN2-12 grossly intact, strength intact Extremities: Perfused, no clubbing Skin: Normal skin turgor, no notable skin lesions seen Psychiatry: Mood normal // no visual hallucinations   Data Reviewed: I have personally reviewed following labs and imaging studies  CBC: Recent Labs  Lab 03/03/19 0444 03/06/19 0318 03/07/19 0434 03/08/19 0352  WBC 12.0* 10.3 9.0 9.7  NEUTROABS 8.9* 7.4  --   --   HGB 8.1* 7.3* 9.2* 9.9*  HCT 29.4* 26.5* 31.8* 34.2*  MCV 88.8 88.3 86.6 87.0  PLT 210 211 227 979   Basic Metabolic Panel: Recent Labs  Lab 03/03/19 0444 03/04/19 0837 03/06/19 0318 03/07/19 0434 03/07/19 1214 03/07/19 2056 03/08/19 0352  NA 141 143 140 142  --   --  139  K 3.7 3.4* 3.8 3.8  --   --  4.3  CL 103 105 101 100  --   --  102  CO2 29 27 28 27   --   --  26  GLUCOSE 223* 130*  127* 131*  --   --  92  BUN 50* 50* 44* 43*  --   --  46*  CREATININE 1.09* 1.17* 1.10* 1.30*  --   --  1.36*  CALCIUM 7.9* 7.5* 7.9* 8.6*  --   --  8.8*  MG  --   --  2.8* 2.4 2.3 2.3 2.3  PHOS  --   --   --  5.2* 5.8* 6.2* 5.4*   GFR: Estimated Creatinine Clearance: 38.4 mL/min (A) (by C-G formula based on SCr of 1.36 mg/dL (H)). Liver Function Tests: Recent Labs  Lab 03/07/19 0434 03/08/19 0352  ALBUMIN 2.5*  2.5*   No results for input(s): LIPASE, AMYLASE in the last 168 hours. No results for input(s): AMMONIA in the last 168 hours. Coagulation Profile: No results for input(s): INR, PROTIME in the last 168 hours. Cardiac Enzymes: Recent Labs  Lab 03/03/19 1521 03/03/19 2254 03/04/19 0452  TROPONINI 0.11* 0.12* 0.11*   BNP (last 3 results) No results for input(s): PROBNP in the last 8760 hours. HbA1C: No results for input(s): HGBA1C in the last 72 hours. CBG: Recent Labs  Lab 03/07/19 1614 03/07/19 2017 03/07/19 2358 03/08/19 0353 03/08/19 0729  GLUCAP 191* 179* 221* 87 116*   Lipid Profile: No results for input(s): CHOL, HDL, LDLCALC, TRIG, CHOLHDL, LDLDIRECT in the last 72 hours. Thyroid Function Tests: No results for input(s): TSH, T4TOTAL, FREET4, T3FREE, THYROIDAB in the last 72 hours. Anemia Panel: No results for input(s): VITAMINB12, FOLATE, FERRITIN, TIBC, IRON, RETICCTPCT in the last 72 hours. Sepsis Labs: No results for input(s): PROCALCITON, LATICACIDVEN in the last 168 hours.  No results found for this or any previous visit (from the past 240 hour(s)).   Radiology Studies: No results found.  Scheduled Meds: . sodium chloride   Intravenous Once  . amiodarone  200 mg Per Tube Daily  . aspirin EC  81 mg Oral Daily  . atorvastatin  40 mg Per Tube q1800  . carvedilol  3.125 mg Per NG tube BID WC  . chlorhexidine gluconate (MEDLINE KIT)  15 mL Mouth Rinse BID  . Chlorhexidine Gluconate Cloth  6 each Topical Q0600  . clonazepam  0.5 mg Per Tube  TID  . feeding supplement (PRO-STAT SUGAR FREE 64)  30 mL Per Tube Daily  . fentaNYL (SUBLIMAZE) injection  50 mcg Intravenous Once  . Glucerna  237 mL Per Tube QID  . hydrALAZINE  10 mg Per Tube Q8H  . insulin aspart  0-24 Units Subcutaneous Q4H  . isosorbide dinitrate  10 mg Per Tube TID  . mouth rinse  15 mL Mouth Rinse 10 times per day  . multivitamin  15 mL Per Tube Daily  . pantoprazole (PROTONIX) IV  40 mg Intravenous Q12H  . polyethylene glycol  17 g Oral Daily  . psyllium  1 packet Per Tube Daily  . QUEtiapine  25 mg Per Tube QHS  . sodium chloride flush  10-40 mL Intracatheter Q12H   Continuous Infusions: . sodium chloride Stopped (03/04/19 0815)     LOS: 32 days   Marylu Lund, MD [facility] Pager On Amion  If 7PM-7AM, please contact night-coverage 03/08/2019, 10:55 AM

## 2019-03-08 NOTE — Progress Notes (Signed)
Assisted tele visit to patient with daughter and she contacted other family members to join in.  Gabrielle Wright, Loni Beckwith, RN

## 2019-03-08 NOTE — Progress Notes (Signed)
Progress Note  Patient Name: Gabrielle Wright Date of Encounter: 03/08/2019  Primary Cardiologist: Minus Breeding, MD   Subjective   Resting in bed.  Appears comfortable    Inpatient Medications    Scheduled Meds: . sodium chloride   Intravenous Once  . amiodarone  200 mg Per Tube Daily  . aspirin EC  81 mg Oral Daily  . atorvastatin  40 mg Per Tube q1800  . carvedilol  3.125 mg Per NG tube BID WC  . chlorhexidine gluconate (MEDLINE KIT)  15 mL Mouth Rinse BID  . Chlorhexidine Gluconate Cloth  6 each Topical Q0600  . clonazepam  0.5 mg Per Tube TID  . feeding supplement (PRO-STAT SUGAR FREE 64)  30 mL Per Tube Daily  . fentaNYL (SUBLIMAZE) injection  50 mcg Intravenous Once  . Glucerna  237 mL Per Tube QID  . hydrALAZINE  10 mg Per Tube Q8H  . insulin aspart  0-24 Units Subcutaneous Q4H  . isosorbide dinitrate  10 mg Per Tube TID  . mouth rinse  15 mL Mouth Rinse 10 times per day  . multivitamin  15 mL Per Tube Daily  . pantoprazole (PROTONIX) IV  40 mg Intravenous Q12H  . polyethylene glycol  17 g Oral Daily  . psyllium  1 packet Per Tube Daily  . QUEtiapine  25 mg Per Tube QHS  . sodium chloride flush  10-40 mL Intracatheter Q12H   Continuous Infusions: . sodium chloride Stopped (03/04/19 0815)   PRN Meds: acetaminophen (TYLENOL) oral liquid 160 mg/5 mL, bisacodyl, dextrose, diphenhydrAMINE, ipratropium-albuterol, lidocaine (PF), LORazepam, ondansetron (ZOFRAN) IV, sennosides, simethicone, sodium chloride flush   Vital Signs    Vitals:   03/08/19 0700 03/08/19 0729 03/08/19 0732 03/08/19 0800  BP:  (!) 110/44  (!) 109/53  Pulse: 75 77  91  Resp: _0 Temp:   98.7 F (37.1 C)   TempSrc:   Axillary   SpO2: 99% 99%  100%  Weight:      Height:        Intake/Output Summary (Last 24 hours) at 03/08/2019 0855 Last data filed at 03/08/2019 0700 Gross per 24 hour  Intake 820 ml  Output 1300 ml  Net -480 ml   Last 3 Weights 03/08/2019 03/07/2019 03/06/2019   Weight (lbs) 162 lb 14.7 oz 178 lb 5.6 oz 179 lb 3.7 oz  Weight (kg) 73.9 kg 80.9 kg 81.3 kg      Telemetry    SR   Only - Personally Reviewed  ECG    No new - Personally Reviewed  Physical Exam   GEN: No acute distress.   Neck: tracheostomy- Vent Cardiac: RRR, no murmurs Respiratory: Clear to auscultation bilaterally. Anteriorly   GI: Soft, minimal tenderness    MS: No edema; No deformity. Neuro:  Nonfocal    Labs    Chemistry Recent Labs  Lab 03/06/19 0318 03/07/19 0434 03/08/19 0352  NA 140 142 139  K 3.8 3.8 4.3  CL 101 100 102  CO2 _1 GLUCOSE 127* 131* 92  BUN 44* 43* 46*  CREATININE 1.10* 1.30* 1.36*  CALCIUM 7.9* 8.6* 8.8*  ALBUMIN  --  2.5* 2.5*  GFRNONAA 50* 41* 39*  GFRAA 58* 47* 45*  ANIONGAP _2 Hematology Recent Labs  Lab 03/06/19 0318 03/07/19 0434 03/08/19 0352  WBC 10.3 9.0 9.7  RBC 3.00* 3.67* 3.93  HGB 7.3* 9.2* 9.9*  HCT 26.5* 31.8* 34.2*  MCV 88.3 86.6 87.0  MCH 24.3* 25.1* 25.2*  MCHC 27.5* 28.9* 28.9*  RDW 19.7* 18.6* 18.6*  PLT 211 227 289    Cardiac Enzymes Recent Labs  Lab 03/03/19 1521 03/03/19 2254 03/04/19 0452  TROPONINI 0.11* 0.12* 0.11*   No results for input(s): TROPIPOC in the last 168 hours.   BNP Recent Labs  Lab 03/03/19 0444  BNP 596.6*     DDimer No results for input(s): DDIMER in the last 168 hours.   Radiology    No results found.  Cardiac Studies   Cardiac cath 02/09/19   2nd Mrg-3 lesion is 80% stenosed.  Acute Mrg lesion is 90% stenosed.    Less than 25% left main  Proximal diffuse 30 to 40% narrowing within the previously placed LAD stent.  The entire apical LAD is diffusely diseased with up to 85% stenosis.  The first diagonal contains 85% ostial narrowing.  Circumflex contains diffuse proximal to mid eccentric 50% stenosis the dominant second obtuse marginal contains mid vessel 50% stenosis in the mid to distal portion of this obtuse marginal there is a patent  stent with 20% narrowing, and just distal to the stent there is an 80% thrombotic stenosis.  The RCA is dominant containing 50% mid vessel stenosis, 70% distal stenosis, 70% ostial PDA stenosis, and total occlusion of the mid vessel that fills by collaterals from the left ventricular apex.  Left ventriculography is not performed.  Today's echo was reviewed and there is anteroapical severe hypokinesis and estimated ejection fraction of 40%.  LVEDP by Cath Lab hemodynamics is 40 mmHg.   TTE3/26/20 IMPRESSIONS    1. The left ventricle has moderately reduced systolic function, with an ejection fraction of 35-40%. Left ventricular diastolic Doppler parameters are consistent with restrictive filling. Elevated left atrial and left ventricular end-diastolic pressures  The E/e' is 34. Left ventricular diffuse hypokinesis.  2. Severe akinesis of the left ventricular, entire apical segment.  3. Severe akinesis of the left ventricular, mid-apical anteroseptal wall and anterior wall.  4. Moderate pleural effusion in both left and right lateral regions.  5. There is mild mitral annular calcification present.  6. The aortic valve is grossly normal Aortic valve regurgitation is mild by color flow Doppler.  7. The inferior vena cava was normal in size with <50% respiratory variability.  8. Left atrial size was not assessed.  9. The ascending aorta and aortic root are normal in size and structure.    Patient Profile     73 y.o. female visiting from [place] admitted March 10 with fever, hypotension and found to have group A strep bacteremia requiring intubation.  Also ruled in for non-ST elevation myocardial infarction. Had cath and medical therapy recommended. Blood cultures March 10 showed group A strep.  Respiratory virus panel showed rhinovirus.  Influenza A and B negative.  Coronavirus negative.  Note LV dysfunction possibly secondary to stress cardiomyopathy.  Yesterday with drop in Hgb to 7.3  and transfused.   Assessment & Plan    PAF patient remains in sinus rhythm on amiodarone and Coreg.  Is not anticoagulated due to history of low hemoglobin unless more AF recurs.  Continue with current management.  NSTEMI   left heart cath noted above.  Patient currently on aspirin but Plavix being held, due to a drop in hemoglobin.  b   Anemia-hemoglobin back up to 9 and has remained stable.  Systolic and diastolic HF  Continues to diurese with Lasix.   CAD  NSTEMI  Left heart catheterization as above with diffuse disease and no intervention.  Resumed aspirin.  Potentially Doshia Dalia not tolerate Plavix due to drop in hemoglobin.  Acute respiratory failure trach 3/31.  Has strep bacteremia.  For questions or updates, please contact [place] Please consult [email or web address] for contact info under    [place] Pearlie Nies sign off.   Medication Recommendations:  Continue aspirin, diuresis Other recommendations (labs, testing, etc):  none Follow up as an outpatient:  To be scheduled     Signed, Marcin Holte Meredith Leeds, MD  03/08/2019, 8:55 AM

## 2019-03-08 NOTE — Progress Notes (Signed)
RT NOTES: With use of interpreter, patient able to communicate she wanted to go back on ventilator, says she cannot breathe. Placed patient back on ventilator at this time.

## 2019-03-09 LAB — CBC
HCT: 31.2 % — ABNORMAL LOW (ref 36.0–46.0)
Hemoglobin: 8.9 g/dL — ABNORMAL LOW (ref 12.0–15.0)
MCH: 25.4 pg — ABNORMAL LOW (ref 26.0–34.0)
MCHC: 28.5 g/dL — ABNORMAL LOW (ref 30.0–36.0)
MCV: 88.9 fL (ref 80.0–100.0)
Platelets: 276 10*3/uL (ref 150–400)
RBC: 3.51 MIL/uL — ABNORMAL LOW (ref 3.87–5.11)
RDW: 18.4 % — ABNORMAL HIGH (ref 11.5–15.5)
WBC: 8.3 10*3/uL (ref 4.0–10.5)
nRBC: 0 % (ref 0.0–0.2)

## 2019-03-09 LAB — RENAL FUNCTION PANEL
Albumin: 2.4 g/dL — ABNORMAL LOW (ref 3.5–5.0)
Anion gap: 10 (ref 5–15)
BUN: 43 mg/dL — ABNORMAL HIGH (ref 8–23)
CO2: 28 mmol/L (ref 22–32)
Calcium: 8.7 mg/dL — ABNORMAL LOW (ref 8.9–10.3)
Chloride: 102 mmol/L (ref 98–111)
Creatinine, Ser: 1.42 mg/dL — ABNORMAL HIGH (ref 0.44–1.00)
GFR calc Af Amer: 43 mL/min — ABNORMAL LOW (ref >60)
GFR calc non Af Amer: 37 mL/min — ABNORMAL LOW (ref >60)
Glucose, Bld: 99 mg/dL (ref 70–99)
Phosphorus: 5.7 mg/dL — ABNORMAL HIGH (ref 2.5–4.6)
Potassium: 3.9 mmol/L (ref 3.5–5.1)
Sodium: 140 mmol/L (ref 135–145)

## 2019-03-09 LAB — GLUCOSE, CAPILLARY
Glucose-Capillary: 98 mg/dL (ref 70–99)
Glucose-Capillary: 109 mg/dL — ABNORMAL HIGH (ref 70–99)
Glucose-Capillary: 207 mg/dL — ABNORMAL HIGH (ref 70–99)
Glucose-Capillary: 124 mg/dL — ABNORMAL HIGH (ref 70–99)

## 2019-03-09 LAB — MAGNESIUM: Magnesium: 2.4 mg/dL (ref 1.7–2.4)

## 2019-03-09 MED ORDER — POLYETHYLENE GLYCOL 3350 17 G PO PACK
17.0000 g | PACK | Freq: Two times a day (BID) | ORAL | Status: DC
  Administered 2019-03-09 – 2019-03-27 (×18): 17 g via ORAL
  Filled 2019-03-09 (×22): qty 1

## 2019-03-09 MED ORDER — SENNOSIDES 8.8 MG/5ML PO SYRP
10.0000 mL | ORAL_SOLUTION | Freq: Two times a day (BID) | ORAL | Status: DC | PRN
  Filled 2019-03-09: qty 10

## 2019-03-09 NOTE — Progress Notes (Signed)
PROGRESS NOTE    Gabrielle Wright  PZW:[id number] DOB: 1945/12/20 DOA: 02/03/2019 PCP: Rudene Anda, MD    Brief Narrative:  73yo female with hx of HTN, possible DM, CAD presented as a transfer to critical care service with group A strep bacteremia requiring intubation and mechanical ventillation. Complicated by NSTEMI and systolic dysfunction. Pt noted to have difficulty with extubation secondary to volume overload. 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 Acute respiratory failure s/p trach 1. Had remained intubated, difficult to wean secondary to volume overload 2. Recently diuresed aggressively  3. Recheck bmet in AM 4. PCCM following, possible trach collar trial later today. Holding off on diuresis per CCM 2. Hypernatremia 1. Resolved 2. Receiving free water flushes 3. Will recheck BMET in AM 3. Ischemic cardiomyopathy 1. Cardiology had been following, since signed off 2. ASA and  plavix currently on hold 3. Currently remains stable 4. Afib RVR 1. Cardiology had been following, now signed off 2. Rate controlled on amiodarone and coreg 3. Recommendations for no anticoagulation unless afib recurs.  4. HR remains stable 5. DM2 1. Glucose stable 2. Continue on SSI coverage as tolerated 6. Dysphagia 1. Currently receiving nutrition through cortrak 2. Nutrition had been following  DVT prophylaxis: SCD's Code Status: Full Family Communication: Pt in room, family  not at bedside Disposition Plan: Uncertain, hopeful SNF  Consultants:   Critical Care  Cardiology  Procedures:   Heart Cath 3/15  Extubated 3/18  Re-intubated 3/30  Trach 3/31  Antimicrobials: Anti-infectives (From admission, onward)   Start     Dose/Rate Route Frequency Ordered Stop   02/19/19 1200  ciprofloxacin (CIPRO) IVPB 400 mg     400 mg 200 mL/hr over 60 Minutes Intravenous Every 12 hours 02/19/19 1119 02/25/19 1321   02/18/19 1230  ciprofloxacin (CIPRO) IVPB 400 mg  Status:  Discontinued     400 mg 200 mL/hr over 60 Minutes Intravenous Every 24 hours 02/18/19 1127 02/19/19 1119   02/14/19 0900  cefTRIAXone (ROCEPHIN) 2 g in sodium chloride 0.9 % 100 mL IVPB     2 g 200 mL/hr over 30 Minutes Intravenous Every 24 hours 02/14/19 0846 02/16/19 1033   02/06/19 1900  penicillin G potassium 8 Million Units in dextrose 5 % 500 mL continuous infusion  Status:  Discontinued     8 Million Units 41.7 mL/hr over 12 Hours Intravenous Every 12 hours 02/06/19 1900 02/14/19 0846   02/06/19 1100  penicillin G potassium 8 Million Units in dextrose 5 % 500 mL continuous infusion  Status:  Discontinued     8 Million Units 41.7 mL/hr over 12 Hours Intravenous Every 12 hours 02/06/19 1008 02/06/19 1900   02/05/19 2300  penicillin G potassium 4 Million Units in dextrose 5 % 250 mL IVPB  Status:  Discontinued     4 Million Units 250 mL/hr over 60 Minutes Intravenous Every 6 hours 02/04/19 2216 02/04/19 2237   02/04/19 2300  vancomycin (VANCOCIN)  1,500 mg in sodium chloride 0.9 % 500 mL IVPB     1,500 mg 250 mL/hr over 120 Minutes Intravenous  Once 02/04/19 2216 02/05/19 0308   02/04/19 2300  penicillin G potassium 4 Million Units in dextrose 5 % 250 mL IVPB  Status:  Discontinued     4 Million Units 250 mL/hr over 60 Minutes Intravenous Every 6 hours 02/04/19 2237 02/06/19 1008   02/04/19 2230  clindamycin (CLEOCIN) IVPB 600 mg  Status:  Discontinued     600 mg 100 mL/hr over 30  Minutes Intravenous Every 8 hours 02/04/19 2216 02/05/19 0917   02/04/19 0315  piperacillin-tazobactam (ZOSYN) IVPB 3.375 g     3.375 g 100 mL/hr over 30 Minutes Intravenous  Once 02/04/19 0303 02/04/19 0408   02/04/19 0315  vancomycin (VANCOCIN) IVPB 1000 mg/200 mL premix     1,000 mg 200 mL/hr over 60 Minutes Intravenous  Once 02/04/19 0303 02/04/19 0424      Subjective: Remains trached, unable to obtain  Objective: Vitals:   03/09/19 1030 03/09/19 1056 03/09/19 1150 03/09/19 1200  BP: (!) 126/58 (!) 126/58  (!) 125/59  Pulse: 88 83  77  Resp: 15 15  12   Temp:   98 F (36.7 C)   TempSrc:   Oral   SpO2: 100% 99%  100%  Weight:      Height:        Intake/Output Summary (Last 24 hours) at 03/09/2019 1228 Last data filed at 03/09/2019 0900 Gross per 24 hour  Intake 237 ml  Output 700 ml  Net -463 ml   Filed Weights   03/07/19 0500 03/08/19 0400 03/09/19 0500  Weight: 80.9 kg 73.9 kg 75.1 kg    Examination: General exam: laying in bed, trach in place Respiratory system: normal chest rise, clear, no audible wheezing Cardiovascular system: regular rhythm, s1-s2 Gastrointestinal system: Nondistended, nontender, pos BS Central nervous system: No seizures, no tremors Extremities: No cyanosis, no joint deformities Skin: No rashes, no pallor Psychiatry: unable to assess as pt has trach   Data Reviewed: I have personally reviewed following labs and imaging studies  CBC: Recent Labs  Lab 03/03/19 0444 03/06/19 0318 03/07/19 0434 03/08/19 0352 03/09/19 0418  WBC 12.0* 10.3 9.0 9.7 8.3  NEUTROABS 8.9* 7.4  --   --   --   HGB 8.1* 7.3* 9.2* 9.9* 8.9*  HCT 29.4* 26.5* 31.8* 34.2* 31.2*  MCV 88.8 88.3 86.6 87.0 88.9  PLT 210 211 227 289 950   Basic Metabolic Panel: Recent Labs  Lab 03/04/19 0837  03/06/19 0318  03/07/19 0434 03/07/19 1214 03/07/19 2056 03/08/19 0352 03/08/19 1810 03/09/19 0418  NA 143  --  140  --  142  --   --  139  --  140  K 3.4*  --  3.8   --  3.8  --   --  4.3  --  3.9  CL 105  --  101  --  100  --   --  102  --  102  CO2 27  --  28  --  27  --   --  26  --  28  GLUCOSE 130*  --  127*  --  131*  --   --  92  --  99  BUN 50*  --  44*  --  43*  --   --  46*  --  43*  CREATININE 1.17*  --  1.10*  --  1.30*  --   --  1.36*  --  1.42*  CALCIUM 7.5*  --  7.9*  --  8.6*  --   --  8.8*  --  8.7*  MG  --    < > 2.8*  --  2.4 2.3 2.3 2.3 2.5* 2.4  PHOS  --   --   --    < > 5.2* 5.8* 6.2* 5.4* 5.6* 5.7*   < > = values in this interval not displayed.   GFR: Estimated Creatinine Clearance: 37.1 mL/min (A) (by C-G formula based on SCr of 1.42 mg/dL (H)). Liver Function Tests: Recent Labs  Lab 03/07/19 0434 03/08/19 0352 03/09/19 0418  ALBUMIN 2.5* 2.5* 2.4*   No results for input(s): LIPASE, AMYLASE in the last 168 hours. No results for input(s): AMMONIA in the last 168 hours. Coagulation Profile: No results for input(s): INR, PROTIME in the last 168 hours. Cardiac Enzymes: Recent Labs  Lab 03/03/19 1521 03/03/19 2254 03/04/19 0452  TROPONINI 0.11* 0.12* 0.11*   BNP (last 3 results) No results for input(s): PROBNP in the last 8760 hours. HbA1C: No results for input(s): HGBA1C in the last 72 hours. CBG: Recent Labs  Lab 03/08/19 1950 03/08/19 2341 03/09/19 0340 03/09/19 0741 03/09/19 1148  GLUCAP 168* 205* 98 109* 207*   Lipid Profile: No results for input(s): CHOL, HDL, LDLCALC, TRIG, CHOLHDL, LDLDIRECT in the last 72 hours. Thyroid Function Tests: No results for input(s): TSH, T4TOTAL, FREET4, T3FREE, THYROIDAB in the last 72 hours. Anemia Panel: No results for input(s): VITAMINB12, FOLATE, FERRITIN, TIBC, IRON, RETICCTPCT in the last 72 hours. Sepsis Labs: No results for input(s): PROCALCITON, LATICACIDVEN in the last 168 hours.  No results found for this or any previous visit (from the past 240 hour(s)).   Radiology Studies: No results found.  Scheduled Meds: . sodium chloride   Intravenous Once  .  amiodarone  200 mg Per Tube Daily  . aspirin EC  81 mg Oral Daily  . atorvastatin  40 mg Per Tube q1800  . carvedilol  3.125 mg Per NG tube BID WC  . chlorhexidine gluconate (MEDLINE KIT)  15 mL Mouth Rinse BID  . Chlorhexidine Gluconate Cloth  6 each Topical Q0600  . clonazepam  0.5 mg Per Tube TID  . feeding supplement (PRO-STAT SUGAR FREE 64)  30 mL Per Tube Daily  . Glucerna  237 mL Per Tube QID  . hydrALAZINE  10 mg Per Tube Q8H  . insulin aspart  0-24 Units Subcutaneous Q4H  . isosorbide dinitrate  10 mg Per Tube TID  . mouth rinse  15 mL Mouth Rinse 10 times per day  . multivitamin  15 mL Per Tube Daily  . pantoprazole (PROTONIX) IV  40 mg Intravenous Q12H  . polyethylene glycol  17 g Oral BID  . psyllium  1 packet Per Tube Daily  . QUEtiapine  25 mg Per Tube QHS  . sodium chloride flush  10-40 mL Intracatheter Q12H   Continuous Infusions: . sodium chloride Stopped (03/04/19 0815)     LOS: 33 days   Marylu Lund, MD [facility] Pager On Amion  If 7PM-7AM, please contact night-coverage 03/09/2019, 12:28 PM

## 2019-03-09 NOTE — Progress Notes (Signed)
NAME:  Gabrielle Wright, MRN:  [id number], DOB:  [birth year], LOS: 33 ADMISSION DATE:  02/03/2019, CONSULTATION DATE: 02/04/2019 REFERRING MD: Emergency department physician CHIEF COMPLAINT: Fever respiratory failure  Brief History   73 year old elderly woman visiting from [place], admitted 3/10 with fevers, hypotension, found to have group A strep bacteremia, required mechanical ventilation Course complicated by non-STEMI and new LV dysfunction ? Ischemic vs Takatsubo's Failed extubation 3/18 due to acute pulmonary edema. S/p trach 3/30.  Past Medical History  Hypertension Coronary artery disease Suspected diabetes.  Significant Hospital Is Events   02/04/2019 transfer from [facility] to [facility] required intubation and pressors. 3/16 off levophed 3/18 extubated briefly but reintubated due to pulmonary edema 3/19 started milrinone 3/20 Episode of respiratory distress,  Precedex changed to propofol Atrial fibrillation RVR >>amiodarone bolus >> sinus rhythm 3/21 rising creat , AF-RVR >> amio bolus 3/24:rising creatinine 3/30: reintubated 3/31 trach 4/1 PICC  Consults:  02/04/2019 ID  IR  Cardiology  Procedures:  ETT 3/10 > 3/18, 3/18 > 3/31 Trach 3/31 > CVL 3/10 > 4/3 PICC 4/1 >   Significant Diagnostic Tests:  Echo 3/15 decreased EF 35 to 40%, LVEDP 27, no significant MR Cardiac cath >> LVEDP 40, RCA totally occluded, patent OM1 stent but distally occluded, apical LAD diffusely diseased, no intervention Chest x-ray 3/22  bilateral lower lobe airspace disease versus layering effusions CXR 3/23> pulmonary edema and bilateral pleural effusions. Echo 3/26 limited> EF 35-40%, elevated LA and LV end-diastolic pressures, Diffuse hypokinesis of LV CXR 4/1> bibasilar opacities, small R pleural effusion, tracheostomy tube present and appropriately positioned  CXR 4/9> bilateral infiltrates with R pleural effusion  Micro Data:  02/04/2019 blood cultures x2>> Gr A  strep 02/04/2019 sputum culture>> neg 02/04/2019 urine culture>>neg 02/04/2019 respiratory virus panel>> rhinovirus 02/04/2019 flu a and B>> neg coronavirus testing 02/04/2019>>neg resp 3/21 >> Klebsiella oxytoca, co-ag negative staph  Antimicrobials:  02/04/2019 vancomycin>>3/ 10 02/04/2019 Zosyn>> 3/10 Pen G 3/10 >> 3/22 (plan) Ceftx 3/20 >>3/24 Cipro 3/24>> 3/31  Interim history/subjective:  Tolerated trach collar for several hours yesterday; this morning already on pressure support and tolerating well so far.   Objective   Blood pressure (!) 98/52, pulse 70, temperature (!) 97.5 F (36.4 C), temperature source Axillary, resp. rate 17, height 5\' 6"  (1.676 m), weight 75.1 kg, SpO2 100 %. CVP:  [0 mmHg-10 mmHg] 9 mmHg  Vent Mode: PSV;CPAP FiO2 (%):  [30 %-35 %] 30 % Set Rate:  [12 bmp] 12 bmp Vt Set:  [450 mL] 450 mL PEEP:  [5 cmH20] 5 cmH20 Pressure Support:  [8 cmH20-10 cmH20] 8 cmH20 Plateau Pressure:  [18 cmH20] 18 cmH20   Intake/Output Summary (Last 24 hours) at 03/09/2019 0744 Last data filed at 03/09/2019 0400 Gross per 24 hour  Intake 490 ml  Output 700 ml  Net -210 ml   Filed Weights   03/07/19 0500 03/08/19 0400 03/09/19 0500  Weight: 80.9 kg 73.9 kg 75.1 kg   Examination:  General: adult female on vent Neuro: Wakes to voice HEENT: [place]/AT. No JVD. Trach in place.  Cardiovascular: RRR, no MRG.  Lungs: Clear. Scattered rhonchi Abdomen:  soft, distended, non tender  Musculoskeletal: No gross deformities, no edema.  Skin: Grossly intact.   Assessment & Plan:   Acute respiratory failure requiring mechanical ventilation - s/p trach 3/31. Had significant weaning to trach collar yesterday. Appears euvolemic today; pressures slightly low so RN holding BP meds. -hold lasix for today but will probably need to  start scheduled lasix tomorrow to keep euvolemic -trach collar again today  Ischemic Cardiomyopathy (Echo 3/26 EF 35-40%, LV diffuse hypokinesis) - no targets for  revascularization on cath - Cardiology following. - Continue coreg, hydralazine, imdur, asa.Holding plavix 2/2 Hgb drop and ?bloody NG output a couple of days ago; since then Hgb stable and no signs of bleeding  Anemia: Report of mild bloody NG output and Hgb drop yesterday from 8.1>7.3; s/p 1u pRBC per primary with Hgb 8.9 this AM. Workup per primary; on IV PPI, holding plavix for now   Atrial Fibrillation with RVR. - Continue amio, coreg.  Diabetes type 2/uncontrolled hyperglycemia. - SSI resistant.  Intermittent agitation - exacerbated by language barrier (family video conference has seemed to help). Wonder how much of this is simply deconditioning.  - Continue clonazepam - Seroquel QHS - PT to work with patient.  - Establish good sleep wake cycle.   Dysphagia:  - Continue TF Cortrak. May need to consider PEG over the next few days if not able to tolerate trach collar consistently.   CM on board for LTAC options.   GI ppx: Protonix VAP: per protocol Pain/Sedation: PRN fentanyl / PRN clonazepam DVT ppx: SCDs / Heparin on hold Diet: Enteral Nutrition Code Status: full   Nyra Market, MD PGY3 Pager [phone]  03/09/2019 7:44 AM

## 2019-03-09 NOTE — Progress Notes (Signed)
Assisted tele visit to patient with daughter and multiple other family members.  Valli Randol, Loni Beckwith, RN

## 2019-03-10 DIAGNOSIS — I5023 Acute on chronic systolic (congestive) heart failure: Secondary | ICD-10-CM

## 2019-03-10 LAB — CBC
HCT: 31.7 % — ABNORMAL LOW (ref 36.0–46.0)
Hemoglobin: 9.1 g/dL — ABNORMAL LOW (ref 12.0–15.0)
MCH: 25.5 pg — ABNORMAL LOW (ref 26.0–34.0)
MCHC: 28.7 g/dL — ABNORMAL LOW (ref 30.0–36.0)
MCV: 88.8 fL (ref 80.0–100.0)
Platelets: 305 10*3/uL (ref 150–400)
RBC: 3.57 MIL/uL — ABNORMAL LOW (ref 3.87–5.11)
RDW: 18.2 % — ABNORMAL HIGH (ref 11.5–15.5)
WBC: 8.8 10*3/uL (ref 4.0–10.5)
nRBC: 0 % (ref 0.0–0.2)

## 2019-03-10 LAB — RENAL FUNCTION PANEL
Albumin: 2.4 g/dL — ABNORMAL LOW (ref 3.5–5.0)
Anion gap: 14 (ref 5–15)
BUN: 35 mg/dL — ABNORMAL HIGH (ref 8–23)
CO2: 26 mmol/L (ref 22–32)
Calcium: 8.8 mg/dL — ABNORMAL LOW (ref 8.9–10.3)
Chloride: 101 mmol/L (ref 98–111)
Creatinine, Ser: 1.21 mg/dL — ABNORMAL HIGH (ref 0.44–1.00)
GFR calc Af Amer: 52 mL/min — ABNORMAL LOW (ref >60)
GFR calc non Af Amer: 45 mL/min — ABNORMAL LOW (ref >60)
Glucose, Bld: 130 mg/dL — ABNORMAL HIGH (ref 70–99)
Phosphorus: 4.1 mg/dL (ref 2.5–4.6)
Potassium: 3.6 mmol/L (ref 3.5–5.1)
Sodium: 141 mmol/L (ref 135–145)

## 2019-03-10 LAB — GLUCOSE, CAPILLARY
Glucose-Capillary: 149 mg/dL — ABNORMAL HIGH (ref 70–99)
Glucose-Capillary: 172 mg/dL — ABNORMAL HIGH (ref 70–99)
Glucose-Capillary: 179 mg/dL — ABNORMAL HIGH (ref 70–99)
Glucose-Capillary: 135 mg/dL — ABNORMAL HIGH (ref 70–99)
Glucose-Capillary: 165 mg/dL — ABNORMAL HIGH (ref 70–99)
Glucose-Capillary: 104 mg/dL — ABNORMAL HIGH (ref 70–99)
Glucose-Capillary: 183 mg/dL — ABNORMAL HIGH (ref 70–99)

## 2019-03-10 LAB — MAGNESIUM: Magnesium: 2.3 mg/dL (ref 1.7–2.4)

## 2019-03-10 MED ORDER — GLUCERNA PO LIQD: 1000.0000 mL | ORAL | Status: DC

## 2019-03-10 MED ORDER — SPIRONOLACTONE 12.5 MG HALF TABLET
12.5000 mg | ORAL_TABLET | Freq: Every day | ORAL | Status: DC
  Administered 2019-03-10 – 2019-03-19 (×10): 12.5 mg
  Filled 2019-03-10 (×10): qty 1

## 2019-03-10 MED ORDER — LACTULOSE 10 GM/15ML PO SOLN
10.0000 g | Freq: Every day | ORAL | Status: DC | PRN
  Administered 2019-03-10: 10 g
  Filled 2019-03-10: qty 15

## 2019-03-10 MED ORDER — LOSARTAN POTASSIUM 25 MG PO TABS
25.0000 mg | ORAL_TABLET | Freq: Every day | ORAL | Status: DC
  Administered 2019-03-10 – 2019-03-21 (×11): 25 mg
  Filled 2019-03-10 (×11): qty 1

## 2019-03-10 MED ORDER — GLUCERNA 1.5 CAL PO LIQD
1000.0000 mL | ORAL | Status: DC
  Administered 2019-03-10 – 2019-03-17 (×9): 1000 mL
  Filled 2019-03-10: qty 1000
  Filled 2019-03-10 (×4): qty 1185
  Filled 2019-03-10: qty 1000
  Filled 2019-03-10: qty 1185
  Filled 2019-03-10: qty 1000
  Filled 2019-03-10 (×2): qty 1185
  Filled 2019-03-10 (×2): qty 1000

## 2019-03-10 NOTE — Progress Notes (Signed)
Nutrition Follow-up  DOCUMENTATION CODES:   Not applicable  INTERVENTION:   Change TF regimen to continuous: - Glucerna 1.5 @ 40 ml/hr (960 ml/day) - Continue Pro-stat 30 ml daily - Free water per MD  Tube feeding regimen provides 1540 kcal, 94 grams of protein, and 729 ml of H2O (106% of kcal needs, 100% of protein needs).  NUTRITION DIAGNOSIS:   Inadequate oral intake related to inability to eat as evidenced by NPO status.  Ongoing, being addressed via TF  GOAL:   Patient will meet greater than or equal to 90% of their needs  Met with TF  MONITOR:   Vent status, TF tolerance, Labs, I & O's  REASON FOR ASSESSMENT:   Consult Enteral/tube feeding initiation and management  ASSESSMENT:   73 yo female with PMH of DM-2, HTN, CAD from [place] who was admitted with fever and respiratory failure requiring intubation. Admission complicated by NSTEMI and new LV dysfunction.  3/18 - failed extubation 3/30 - extubated 3/31 - DNR rescinded, re-intubated, trach 4/03 - post-pyloric Cortrak placed 4/08 - pt vomiting, NGT placed to suction, TF stopped  Pt with difficulty weaning from vent secondary to volume overload. Pt tolerating short period of trach collar at this time. Plan is for 2-hour trach collar trial twice daily and extend duration to 3 hours tomorrow per CCM note. Per CCM, pt may need PEG if unable to tolerate trach collar consistently.  RD contacted by RN regarding pt experiencing abdominal pain with bolus TF regimen. RD to change regimen to continuous and monitor for tolerance.  Overall, weight down 14 lbs since first measured admission weight on 3/10.  Patient is currently on ventilator support via trach. MV: 6.9 L/min Temp (24hrs), Avg:98.6 F (37 C), Min:98.1 F (36.7 C), Max:99.1 F (37.3 C) BP: 104/53 MAP: 69  Medications reviewed and include: SSI, liquid MVI, Protonix, Miralax, psyllium, spironolactone  Labs reviewed: BUN 35 (H), creatinine 1.21  (H), hemoglobin 9.1 (L) K+, mag, phos all WNL. CBG's: 165, 135, 149, 179, 172, 124 x 24 hours  Diet Order:   Diet Order            Diet NPO time specified  Diet effective now              EDUCATION NEEDS:   No education needs have been identified at this time  Skin:  Skin Assessment: Reviewed RN Assessment  Last BM:  03/07/19 small type 6  Height:   Ht Readings from Last 1 Encounters:  03/08/19 5' 6"  (1.676 m)    Weight:   Wt Readings from Last 1 Encounters:  03/10/19 73.7 kg    Ideal Body Weight:  59.1 kg  BMI:  Body mass index is 26.22 kg/m.  Estimated Nutritional Needs:  Kcal:  9407  Protein:  90-105 grams  Fluid:  >/= 1.4    Gaynell Face, MS, RD, LDN Inpatient Clinical Dietitian Pager: [phone] Weekend/After Hours: [phone]

## 2019-03-10 NOTE — Progress Notes (Signed)
PROGRESS NOTE    Gabrielle Wright  IEP:[id number] DOB: Jan 11, 1946 DOA: 02/03/2019 PCP: Rudene Anda, MD    Brief Narrative:  73yo female with hx of HTN, possible DM, CAD presented as a transfer to critical care service with group A strep bacteremia requiring intubation and mechanical ventillation. Complicated by NSTEMI and systolic dysfunction. Pt noted to have difficulty with extubation secondary to volume overload. 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 Acute respiratory failure s/p trach 1. Had remained intubated, difficult to wean secondary to volume overload 2. Recently diuresed aggressively  3. Renal function stable 4. PCCM following. Continue with trach collar trials per CCM 2. Hypernatremia 1. Resolved. Labs reviewed 2. Receiving free water flushes 3. Repeat bmet in AM 3. Ischemic cardiomyopathy 1. Cardiology had been following, since signed off 2. ASA and  plavix currently on hold 3. Patient remains stable 4. Afib RVR 1. Cardiology had been following, now signed off 2. Rate controlled on amiodarone and coreg 3. Continue off anticoagulation unless afib recurs.  4. HR remains stable 5. DM2 1. Glucose stable 2. Continue on SSI coverage as needed 6. Dysphagia 1. Currently receiving nutrition through cortrak 2. Nutrition had been following  DVT prophylaxis: SCD's Code Status: Full Family Communication: Pt in room, family not at bedside Disposition Plan:  Uncertain, hopeful SNF  Consultants:   Critical Care  Cardiology  Procedures:   Heart Cath 3/15  Extubated 3/18  Re-intubated 3/30  Trach 3/31  Antimicrobials: Anti-infectives (From admission, onward)   Start     Dose/Rate Route Frequency Ordered Stop   02/19/19 1200  ciprofloxacin (CIPRO) IVPB 400 mg     400 mg 200 mL/hr over 60 Minutes Intravenous Every 12 hours 02/19/19 1119 02/25/19 1321   02/18/19 1230  ciprofloxacin (CIPRO) IVPB 400 mg  Status:  Discontinued     400 mg 200 mL/hr over 60 Minutes Intravenous Every 24 hours 02/18/19 1127 02/19/19 1119   02/14/19 0900  cefTRIAXone (ROCEPHIN) 2 g in sodium chloride 0.9 % 100 mL IVPB     2 g 200 mL/hr over 30 Minutes Intravenous Every 24 hours 02/14/19 0846 02/16/19 1033   02/06/19 1900  penicillin G potassium 8 Million Units in dextrose 5 % 500 mL continuous infusion  Status:  Discontinued     8 Million Units 41.7 mL/hr over 12 Hours Intravenous Every 12 hours 02/06/19 1900 02/14/19 0846   02/06/19 1100  penicillin G potassium 8 Million Units in dextrose 5 % 500 mL continuous infusion  Status:  Discontinued     8 Million Units 41.7 mL/hr over 12 Hours Intravenous Every 12 hours 02/06/19 1008 02/06/19 1900   02/05/19 2300  penicillin G potassium 4 Million Units in dextrose 5 % 250 mL IVPB  Status:  Discontinued     4 Million Units 250 mL/hr over 60 Minutes Intravenous Every 6 hours 02/04/19 2216 02/04/19 2237   02/04/19 2300  vancomycin (VANCOCIN) 1,500 mg in sodium chloride 0.9 %  500 mL IVPB     1,500 mg 250 mL/hr over 120 Minutes Intravenous  Once 02/04/19 2216 02/05/19 0308   02/04/19 2300  penicillin G potassium 4 Million Units in dextrose 5 % 250 mL IVPB  Status:  Discontinued     4 Million Units 250 mL/hr over 60 Minutes Intravenous Every 6 hours 02/04/19 2237 02/06/19 1008   02/04/19 2230  clindamycin (CLEOCIN) IVPB 600 mg  Status:  Discontinued     600 mg 100 mL/hr over 30 Minutes Intravenous Every 8 hours  02/04/19 2216 02/05/19 0917   02/04/19 0315  piperacillin-tazobactam (ZOSYN) IVPB 3.375 g     3.375 g 100 mL/hr over 30 Minutes Intravenous  Once 02/04/19 0303 02/04/19 0408   02/04/19 0315  vancomycin (VANCOCIN) IVPB 1000 mg/200 mL premix     1,000 mg 200 mL/hr over 60 Minutes Intravenous  Once 02/04/19 0303 02/04/19 0424      Subjective: Unable to obtain as pt has trach, however pt is pointing to abd and grimacing when palpating abd  Objective: Vitals:   03/10/19 0611 03/10/19 0732 03/10/19 0749 03/10/19 0800  BP: (!) 109/54 (!) 109/54  (!) 113/52  Pulse:  81  82  Resp:  19  19  Temp:   98.4 F (36.9 C)   TempSrc:   Oral   SpO2:  100%  99%  Weight:      Height:        Intake/Output Summary (Last 24 hours) at 03/10/2019 0853 Last data filed at 03/09/2019 1700 Gross per 24 hour  Intake 711 ml  Output -  Net 711 ml   Filed Weights   03/08/19 0400 03/09/19 0500 03/10/19 0500  Weight: 73.9 kg 75.1 kg 73.7 kg    Examination: General exam: Conversant, in no acute distress, trach in place Respiratory system: normal chest rise, clear, no audible wheezing Cardiovascular system: regular rhythm, s1-s2 Gastrointestinal system: Nondistended, grimcacing when palpating abd Central nervous system: No seizures, no tremors Extremities: No cyanosis, no joint deformities Skin: No rashes, no pallor Psychiatry: Affect normal // no auditory hallucinations   Data Reviewed: I have personally reviewed following labs and imaging studies  CBC: Recent Labs  Lab 03/06/19 0318 03/07/19 0434 03/08/19 0352 03/09/19 0418 03/10/19 0448  WBC 10.3 9.0 9.7 8.3 8.8  NEUTROABS 7.4  --   --   --   --   HGB 7.3* 9.2* 9.9* 8.9* 9.1*  HCT 26.5* 31.8* 34.2* 31.2* 31.7*  MCV 88.3 86.6 87.0 88.9 88.8  PLT 211 227 289 276 740   Basic Metabolic Panel: Recent Labs  Lab 03/06/19 0318 03/07/19 0434  03/07/19 2056 03/08/19 0352 03/08/19 1810 03/09/19 0418 03/10/19 0448  NA 140 142  --   --  139   --  140 141  K 3.8 3.8  --   --  4.3  --  3.9 3.6  CL 101 100  --   --  102  --  102 101  CO2 28 27  --   --  26  --  28 26  GLUCOSE 127* 131*  --   --  92  --  99 130*  BUN 44* 43*  --   --  46*  --  43* 35*  CREATININE 1.10* 1.30*  --   --  1.36*  --  1.42* 1.21*  CALCIUM 7.9* 8.6*  --   --  8.8*  --  8.7* 8.8*  MG 2.8* 2.4   < > 2.3 2.3  2.5* 2.4 2.3  PHOS  --  5.2*   < > 6.2* 5.4* 5.6* 5.7* 4.1   < > = values in this interval not displayed.   GFR: Estimated Creatinine Clearance: 43.2 mL/min (A) (by C-G formula based on SCr of 1.21 mg/dL (H)). Liver Function Tests: Recent Labs  Lab 03/07/19 0434 03/08/19 0352 03/09/19 0418 03/10/19 0448  ALBUMIN 2.5* 2.5* 2.4* 2.4*   No results for input(s): LIPASE, AMYLASE in the last 168 hours. No results for input(s): AMMONIA in the last 168 hours. Coagulation Profile: No results for input(s): INR, PROTIME in the last 168 hours. Cardiac Enzymes: Recent Labs  Lab 03/03/19 1521 03/03/19 2254 03/04/19 0452  TROPONINI 0.11* 0.12* 0.11*   BNP (last 3 results) No results for input(s): PROBNP in the last 8760 hours. HbA1C: No results for input(s): HGBA1C in the last 72 hours. CBG: Recent Labs  Lab 03/09/19 1549 03/09/19 1948 03/10/19 0017 03/10/19 0346 03/10/19 0743  GLUCAP 124* 172* 179* 149* 135*   Lipid Profile: No results for input(s): CHOL, HDL, LDLCALC, TRIG, CHOLHDL, LDLDIRECT in the last 72 hours. Thyroid Function Tests: No results for input(s): TSH, T4TOTAL, FREET4, T3FREE, THYROIDAB in the last 72 hours. Anemia Panel: No results for input(s): VITAMINB12, FOLATE, FERRITIN, TIBC, IRON, RETICCTPCT in the last 72 hours. Sepsis Labs: No results for input(s): PROCALCITON, LATICACIDVEN in the last 168 hours.  No results found for this or any previous visit (from the past 240 hour(s)).   Radiology Studies: No results found.  Scheduled Meds: . sodium chloride   Intravenous Once  . amiodarone  200 mg Per Tube Daily  .  aspirin EC  81 mg Oral Daily  . atorvastatin  40 mg Per Tube q1800  . carvedilol  3.125 mg Per NG tube BID WC  . chlorhexidine gluconate (MEDLINE KIT)  15 mL Mouth Rinse BID  . Chlorhexidine Gluconate Cloth  6 each Topical Q0600  . clonazepam  0.5 mg Per Tube TID  . feeding supplement (PRO-STAT SUGAR FREE 64)  30 mL Per Tube Daily  . Glucerna  237 mL Per Tube QID  . hydrALAZINE  10 mg Per Tube Q8H  . insulin aspart  0-24 Units Subcutaneous Q4H  . isosorbide dinitrate  10 mg Per Tube TID  . losartan  25 mg Per Tube Daily  . mouth rinse  15 mL Mouth Rinse 10 times per day  . multivitamin  15 mL Per Tube Daily  . pantoprazole (PROTONIX) IV  40 mg Intravenous Q12H  . polyethylene glycol  17 g Oral BID  . psyllium  1 packet Per Tube Daily  . QUEtiapine  25 mg Per Tube QHS  . sodium chloride flush  10-40 mL Intracatheter Q12H  . spironolactone  12.5 mg Per Tube Daily   Continuous Infusions: . sodium chloride Stopped (03/04/19 0815)     LOS: 34 days   Marylu Lund, MD [facility] Pager On Amion  If 7PM-7AM, please contact night-coverage 03/10/2019, 8:53 AM

## 2019-03-10 NOTE — Progress Notes (Signed)
NAME:  Gabrielle Wright, MRN:  [id number], DOB:  02/09/1946, LOS: 34 ADMISSION DATE:  02/03/2019, CONSULTATION DATE: 02/04/2019 REFERRING MD: Emergency department physician CHIEF COMPLAINT: Fever respiratory failure  Brief History   73 year old elderly woman visiting from [place], admitted 3/10 with fevers, hypotension, found to have group A strep bacteremia, required mechanical ventilation Course complicated by non-STEMI and new LV dysfunction ? Ischemic vs Takatsubo's Failed extubation 3/18 due to acute pulmonary edema. S/p trach 3/30.  Past Medical History  Hypertension Coronary artery disease Suspected diabetes.  Significant Hospital Is Events   02/04/2019 transfer from [facility] to [facility] required intubation and pressors. 3/16 off levophed 3/18 extubated briefly but reintubated due to pulmonary edema 3/19 started milrinone 3/20 Episode of respiratory distress,  Precedex changed to propofol Atrial fibrillation RVR >>amiodarone bolus >> sinus rhythm 3/21 rising creat , AF-RVR >> amio bolus 3/24:rising creatinine 3/30: reintubated 3/31 trach 4/1 PICC  Consults:  02/04/2019 ID  IR  Cardiology  Procedures:  ETT 3/10 > 3/18, 3/18 > 3/31 Trach 3/31 > CVL 3/10 > 4/3 PICC 4/1 >   Significant Diagnostic Tests:  Echo 3/15 decreased EF 35 to 40%, LVEDP 27, no significant MR Cardiac cath >> LVEDP 40, RCA totally occluded, patent OM1 stent but distally occluded, apical LAD diffusely diseased, no intervention Chest x-ray 3/22  bilateral lower lobe airspace disease versus layering effusions CXR 3/23> pulmonary edema and bilateral pleural effusions. Echo 3/26 limited> EF 35-40%, elevated LA and LV end-diastolic pressures, Diffuse hypokinesis of LV CXR 4/1> bibasilar opacities, small R pleural effusion, tracheostomy tube present and appropriately positioned  CXR 4/9> bilateral infiltrates with R pleural effusion  Micro Data:  02/04/2019 blood cultures x2>> Gr A  strep 02/04/2019 sputum culture>> neg 02/04/2019 urine culture>>neg 02/04/2019 respiratory virus panel>> rhinovirus 02/04/2019 flu a and B>> neg coronavirus testing 02/04/2019>>neg resp 3/21 >> Klebsiella oxytoca, co-ag negative staph  Antimicrobials:  02/04/2019 vancomycin>>3/ 10 02/04/2019 Zosyn>> 3/10 Pen G 3/10 >> 3/22 (plan) Ceftx 3/20 >>3/24 Cipro 3/24>> 3/31  Interim history/subjective:  No acute events over night. Trach collar trial this AM.  Objective   Blood pressure (!) 109/54, pulse 81, temperature 98.4 F (36.9 C), temperature source Oral, resp. rate 19, height 5\' 6"  (1.676 m), weight 73.7 kg, SpO2 100 %. CVP:  [8 mmHg-9 mmHg] 9 mmHg  Vent Mode: Stand-by FiO2 (%):  [30 %-40 %] 35 % Set Rate:  [12 bmp] 12 bmp Vt Set:  [450 mL] 450 mL PEEP:  [5 cmH20] 5 cmH20 Plateau Pressure:  [18 cmH20-19 cmH20] 19 cmH20   Intake/Output Summary (Last 24 hours) at 03/10/2019 0755 Last data filed at 03/09/2019 1700 Gross per 24 hour  Intake 711 ml  Output -  Net 711 ml   Filed Weights   03/08/19 0400 03/09/19 0500 03/10/19 0500  Weight: 73.9 kg 75.1 kg 73.7 kg   Examination:  General: adult female on vent Neuro: Wakes to voice HEENT: [place]/AT. No JVD. Trach in place.  Cardiovascular: RRR, no MRG.  Lungs: Clear. Scattered rhonchi Abdomen:  soft, distended, non tender  Musculoskeletal: No gross deformities, no edema.  Skin: Grossly intact.   Assessment & Plan:   Acute respiratory failure requiring mechanical ventilation - s/p trach 3/31. Weaned on trach collar for several hours yesterday. Will try to do trach collar 2hrs at a time about twice daily. Appears euvolemic. -trach collar again today  Ischemic Cardiomyopathy (Echo 3/26 EF 35-40%, LV diffuse hypokinesis) - no targets for revascularization on cath -  Cardiology following. - Continue coreg, hydralazine, imdur, asa.Holding plavix 2/2 Hgb drop and ?bloody NG output last week; since then Hgb stable and no signs of bleeding   Anemia: Report of mild bloody NG output and Hgb drop last week from 8.1>7.3; s/p 1u pRBC per primary. Stable Hgb since then and no signs of bleeding. ASA restarted but still holding plavix, per primary and cards.  Atrial Fibrillation with RVR. - Continue amio, coreg.  Diabetes type 2/uncontrolled hyperglycemia. - SSI resistant.  Intermittent agitation - exacerbated by language barrier (family video conference has seemed to help).   - Continue clonazepam - Seroquel QHS - PT to work with patient.  - Establish good sleep wake cycle.   Dysphagia:  - Continue TF Cortrak. May need to consider PEG over the next few days if not able to tolerate trach collar consistently.   CM on board for LTAC options.   GI ppx: Protonix VAP: per protocol Pain/Sedation: PRN clonazepam DVT ppx: SCDs  Diet: Enteral Nutrition Code Status: full   Nyra Market, MD PGY3 Pager [phone]  03/10/2019 7:55 AM

## 2019-03-10 NOTE — Progress Notes (Signed)
Physical Therapy Treatment Patient Details Name: Gabrielle Wright MRN: [id number] DOB: 10/21/1946 Today's Date: 03/10/2019    History of Present Illness 73 y.o. female admitted on 02/03/19 for fever and cough (to [facility]) and was transferred to [facility] on 02/04/19 for hypotension and respiratory distress (acute respiratory failure/pulmonary edema) requiring intubation 3/10-current (time of PT eval 02/27/2019).  She was visiting family from [place] and COVID 19 tests were negative.  She was found to have group A strep bacteremia (septic shiock), rhinovirus,  and course complicated by NSTEMI and new LV dysfunction s/p cardiac cath on 02/09/19.  Cardiology following.  Other dx include ischemic cardiomyopathy, hyperkalemia, DM2- uncontrolled, and abdominal distention.  Pt with significant PMH of DM, HTN, anc CAD. Trach placed on 3/31    PT Comments    Pt admitted with above diagnosis. Pt currently with functional limitations due to balance and endurance deficits. Pt was able to sit EOB and transfer with mod assist of 2. 3rd person helpful for lines.  Attempts at standing difficult as pt buckling and could not stand fully upright even with assist. Pt does fatigued quickly and not sure if this was due to fatigue. Used interpreter and pt appears to follow commands.  Appears to understand as well and appears oriented.  Will continue to follow acutely.   Pt will benefit from skilled PT to increase their independence and safety with mobility to allow discharge to the venue listed below.     Follow Up Recommendations  SNF     Equipment Recommendations  Other (comment)(defer to next venue)    Recommendations for Other Services Rehab consult     Precautions / Restrictions Precautions Precautions: Fall;Other (comment) Precaution Comments: trach, vent Restrictions Weight Bearing Restrictions: No    Mobility  Bed Mobility Overal bed mobility: Needs Assistance Bed Mobility: Supine to Sit      Supine to sit: Mod assist;+2 for physical assistance;+2 for safety/equipment;HOB elevated     General bed mobility comments: after therapists initiate, Pt able to help with moving legs to EOB, assist for trunk elevation, use of bed pad to bring hips EOB (bed it too high for Pt to get feet on the ground even in the lowest position) overall all actions required mod A +2  Transfers Overall transfer level: Needs assistance Equipment used: 2 person hand held assist(EVA walker; face to face with gait belt) Transfers: Sit to/from Stand;Stand Pivot Transfers Sit to Stand: Max assist;+2 physical assistance;+2 safety/equipment;From elevated surface(x2 unable to achieve full upright with EVA walker) Stand pivot transfers: Max assist;+2 physical assistance;+2 safety/equipment;From elevated surface(face to face with bed pad scooped underneath for boost)       General transfer comment: +3 helpful for line management, Pt attempted to stand x 2 but could not achieve full stand.  LEs buckling somewhat.  Ultimately, pivoted pt to chair with +2 HHA blocking pts knees as well.  Pt did not stand fully upright.    Ambulation/Gait                 Stairs             Wheelchair Mobility    Modified Rankin (Stroke Patients Only)       Balance Overall balance assessment: Needs assistance Sitting-balance support: Bilateral upper extremity supported;Feet unsupported(bed too tall) Sitting balance-Leahy Scale: Poor Sitting balance - Comments: fatigues quickly Postural control: Posterior lean Standing balance support: Bilateral upper extremity supported;During functional activity Standing balance-Leahy Scale: Poor Standing balance comment: relaint on  external support and 2 person assist                            Cognition Arousal/Alertness: Awake/alert Behavior During Therapy: WFL for tasks assessed/performed Overall Cognitive Status: Difficult to assess                                  General Comments: Pt following some simple commands with increased time and cues      Exercises General Exercises - Lower Extremity Ankle Circles/Pumps: Both;Supine;AROM;15 reps Heel Slides: AAROM;Supine;Both;10 reps    General Comments General comments (skin integrity, edema, etc.): initial vitals: BP: 113/52, HR: 86, O2: 100% on 40% through trach/vent PEEP of 5; EOS: BP: 90/44, HR: 89, O2: 100% on 40% trach/vent - VSS throughout session      Pertinent Vitals/Pain Pain Assessment: Faces Faces Pain Scale: Hurts a little bit Pain Location: LLE Pain Descriptors / Indicators: Grimacing Pain Intervention(s): Limited activity within patient's tolerance;Monitored during session;Repositioned    Home Living                      Prior Function            PT Goals (current goals can now be found in the care plan section) Acute Rehab PT Goals Patient Stated Goal: agreeable to working with therapies Progress towards PT goals: Progressing toward goals    Frequency    Min 2X/week      PT Plan Current plan remains appropriate    Co-evaluation PT/OT/SLP Co-Evaluation/Treatment: Yes Reason for Co-Treatment: Complexity of the patient's impairments (multi-system involvement) PT goals addressed during session: Mobility/safety with mobility OT goals addressed during session: ADL's and self-care;Proper use of Adaptive equipment and DME;Strengthening/ROM      AM-PAC PT "6 Clicks" Mobility   Outcome Measure  Help needed turning from your back to your side while in a flat bed without using bedrails?: A Lot Help needed moving from lying on your back to sitting on the side of a flat bed without using bedrails?: A Lot Help needed moving to and from a bed to a chair (including a wheelchair)?: A Lot Help needed standing up from a chair using your arms (e.g., wheelchair or bedside chair)?: A Lot Help needed to walk in hospital room?: Total Help needed climbing  3-5 steps with a railing? : Total 6 Click Score: 10    End of Session Equipment Utilized During Treatment: Oxygen;Gait belt(vent with trach) Activity Tolerance: Patient limited by fatigue Patient left: with call bell/phone within reach;in chair;with chair alarm set(lift pad in place) Nurse Communication: Mobility status;Need for lift equipment PT Visit Diagnosis: Muscle weakness (generalized) (M62.81);Difficulty in walking, not elsewhere classified (R26.2)     Time: 7793-9030 PT Time Calculation (min) (ACUTE ONLY): 42 min  Charges:  $Therapeutic Exercise: 8-22 mins $Therapeutic Activity: 8-22 mins                     Mellany Dinsmore,PT Acute Rehabilitation Services Pager:  [phone]  Office:  [phone]     Berline Lopes 03/10/2019, 10:39 AM

## 2019-03-10 NOTE — Progress Notes (Signed)
Patient's Gabrielle Wright is mailing her pictures. Please call him at [phone] when the package arrives.

## 2019-03-10 NOTE — Clinical Social Work Note (Signed)
CSW continuing vent SNF search  As of 4/13 [facility] in [place]: confirmed no vent SNF beds avaliable [facility] in [place]: confirmed no vent SNF beds [facility] in [place]: unable to accept pt [facility] in [place]: left voicemail with admissions [facility] in [place]: left voicemail with admissions [facility] in [place]: awaiting response  [facility] in [place]  MD: awaiting response  CSW will continue to check in with each facility everyday until placement is found.  [place], [place] [phone]

## 2019-03-10 NOTE — Progress Notes (Signed)
Occupational Therapy Treatment Patient Details Name: Gabrielle Wright MRN: [id number] DOB: 03/09/1946 Today's Date: 03/10/2019    History of present illness 73 y.o. female admitted on 02/03/19 for fever and cough (to [facility]) and was transferred to [facility] on 02/04/19 for hypotension and respiratory distress (acute respiratory failure/pulmonary edema) requiring intubation 3/10-current (time of PT eval 02/27/2019).  She was visiting family from [place] and COVID 19 tests were negative.  She was found to have group A strep bacteremia (septic shiock), rhinovirus,  and course complicated by NSTEMI and new LV dysfunction s/p cardiac cath on 02/09/19.  Cardiology following.  Other dx include ischemic cardiomyopathy, hyperkalemia, DM2- uncontrolled, and abdominal distention.  Pt with significant PMH of DM, HTN, anc CAD. Trach placed on 3/31   OT comments  Pt making progress towards OT goals this session, able to perform incomplete sit<>stand x2 from EOB, and stand pivot transfer to recliner with max A +2. While sitting EOB, she was able to participate in grooming tasks, and get her back washed overall at mod to max A. Pt is improving in her non-verbal communication and overall strength. OT will continue to follow acutely.    Follow Up Recommendations  Supervision/Assistance - 24 hour;SNF    Equipment Recommendations  Other (comment)(defer to next venue of care)    Recommendations for Other Services      Precautions / Restrictions Precautions Precautions: Fall;Other (comment) Precaution Comments: trach, vent Restrictions Weight Bearing Restrictions: No       Mobility Bed Mobility Overal bed mobility: Needs Assistance Bed Mobility: Supine to Sit     Supine to sit: Mod assist;+2 for physical assistance;+2 for safety/equipment;HOB elevated     General bed mobility comments: after therapists initiate, Pt able to help with moving legs to EOB, assist for trunk elevation, use of bed pad to  bring hips EOB (bed it too high for Pt to get feet on the ground even in the lowest position) overall all actions required mod A +2  Transfers Overall transfer level: Needs assistance Equipment used: 2 person hand held assist(EVA walker; face to face with gait belt) Transfers: Sit to/from Stand;Stand Pivot Transfers Sit to Stand: Max assist;+2 physical assistance;+2 safety/equipment;From elevated surface(x2 unable to achieve full upright with EVA walker) Stand pivot transfers: Max assist;+2 physical assistance;+2 safety/equipment;From elevated surface(face to face with bed pad scooped underneath for boost)       General transfer comment: +3 helpful for line management    Balance Overall balance assessment: Needs assistance Sitting-balance support: Bilateral upper extremity supported;Feet unsupported(bed too tall) Sitting balance-Leahy Scale: Poor Sitting balance - Comments: fatigues quickly Postural control: Posterior lean   Standing balance-Leahy Scale: Poor Standing balance comment: relaint on external support                           ADL either performed or assessed with clinical judgement   ADL Overall ADL's : Needs assistance/impaired     Grooming: Wash/dry face;Minimal assistance;Sitting;Bed level;Moderate assistance Grooming Details (indicate cue type and reason): able to wash face with warm wash cloth, mod A for thoroughness and fatigues quickly Upper Body Bathing: Maximal assistance;Sitting Upper Body Bathing Details (indicate cue type and reason): for back EOB             Toilet Transfer: Maximal assistance;+2 for physical assistance;+2 for safety/equipment;Stand-pivot;BSC(face to face)   Toileting- Clothing Manipulation and Hygiene: Total assistance       Functional mobility during ADLs: Maximal  assistance;+2 for physical assistance;+2 for safety/equipment(SPT only)       Vision       Perception     Praxis      Cognition  Arousal/Alertness: Awake/alert Behavior During Therapy: WFL for tasks assessed/performed Overall Cognitive Status: Difficult to assess                                 General Comments: Pt following some simple commands with increased time and cues        Exercises     Shoulder Instructions       General Comments initial vitals: BP: 113/52, HR: 86, O2: 100% on 40% through trach/vent PEEP of 5; EOS: BP: 90/44, HR: 89, O2: 100% on 40% trach/vent - VSS throughout session    Pertinent Vitals/ Pain       Pain Assessment: Faces Faces Pain Scale: Hurts a little bit Pain Location: LLE Pain Descriptors / Indicators: Grimacing Pain Intervention(s): Monitored during session;Repositioned  Home Living                                          Prior Functioning/Environment              Frequency  Min 3X/week        Progress Toward Goals  OT Goals(current goals can now be found in the care plan section)  Progress towards OT goals: Progressing toward goals  Acute Rehab OT Goals Patient Stated Goal: agreeable to working with therapies OT Goal Formulation: With family Time For Goal Achievement: 03/18/19 Potential to Achieve Goals: Fair  Plan Discharge plan remains appropriate    Co-evaluation    PT/OT/SLP Co-Evaluation/Treatment: Yes Reason for Co-Treatment: Complexity of the patient's impairments (multi-system involvement);To address functional/ADL transfers PT goals addressed during session: Mobility/safety with mobility;Balance;Proper use of DME;Strengthening/ROM OT goals addressed during session: ADL's and self-care;Proper use of Adaptive equipment and DME;Strengthening/ROM      AM-PAC OT "6 Clicks" Daily Activity     Outcome Measure   Help from another person eating meals?: Total(NPO) Help from another person taking care of personal grooming?: A Lot Help from another person toileting, which includes using toliet, bedpan, or urinal?:  A Lot Help from another person bathing (including washing, rinsing, drying)?: A Lot Help from another person to put on and taking off regular upper body clothing?: Total Help from another person to put on and taking off regular lower body clothing?: Total 6 Click Score: 9    End of Session Equipment Utilized During Treatment: Gait belt;Oxygen(40% support trach/vent; EVA walker)  OT Visit Diagnosis: Muscle weakness (generalized) (M62.81);Other symptoms and signs involving cognitive function   Activity Tolerance Patient limited by fatigue   Patient Left in chair;with call bell/phone within reach;with chair alarm set   Nurse Communication Mobility status        Time: 8264-1583 OT Time Calculation (min): 35 min  Charges: OT General Charges $OT Visit: 1 Visit OT Treatments $Self Care/Home Management : 8-22 mins  Sherryl Manges OTR/L Acute Rehabilitation Services Pager: [phone] Office: [phone]   Evern Bio Addilee Neu 03/10/2019, 10:28 AM

## 2019-03-10 NOTE — Progress Notes (Signed)
Progress Note  Patient Name: Gabrielle Wright Date of Encounter: 03/10/2019  Primary Cardiologist: Minus Breeding, MD   Subjective   Lying in bed on vent through trach.    Awake alert. Keeps waving her hands. CVP 3-4  Inpatient Medications    Scheduled Meds: . sodium chloride   Intravenous Once  . amiodarone  200 mg Per Tube Daily  . aspirin EC  81 mg Oral Daily  . atorvastatin  40 mg Per Tube q1800  . carvedilol  3.125 mg Per NG tube BID WC  . chlorhexidine gluconate (MEDLINE KIT)  15 mL Mouth Rinse BID  . Chlorhexidine Gluconate Cloth  6 each Topical Q0600  . clonazepam  0.5 mg Per Tube TID  . feeding supplement (PRO-STAT SUGAR FREE 64)  30 mL Per Tube Daily  . Glucerna  237 mL Per Tube QID  . hydrALAZINE  10 mg Per Tube Q8H  . insulin aspart  0-24 Units Subcutaneous Q4H  . isosorbide dinitrate  10 mg Per Tube TID  . mouth rinse  15 mL Mouth Rinse 10 times per day  . multivitamin  15 mL Per Tube Daily  . pantoprazole (PROTONIX) IV  40 mg Intravenous Q12H  . polyethylene glycol  17 g Oral BID  . psyllium  1 packet Per Tube Daily  . QUEtiapine  25 mg Per Tube QHS  . sodium chloride flush  10-40 mL Intracatheter Q12H   Continuous Infusions: . sodium chloride Stopped (03/04/19 0815)   PRN Meds: acetaminophen (TYLENOL) oral liquid 160 mg/5 mL, bisacodyl, dextrose, diphenhydrAMINE, ipratropium-albuterol, lidocaine (PF), LORazepam, ondansetron (ZOFRAN) IV, sennosides, simethicone, sodium chloride flush   Vital Signs    Vitals:   03/10/19 0611 03/10/19 0732 03/10/19 0749 03/10/19 0800  BP: (!) 109/54 (!) 109/54  (!) 113/52  Pulse:  81  82  Resp:  19  19  Temp:   98.4 F (36.9 C)   TempSrc:   Oral   SpO2:  100%  99%  Weight:      Height:        Intake/Output Summary (Last 24 hours) at 03/10/2019 0825 Last data filed at 03/09/2019 1700 Gross per 24 hour  Intake 711 ml  Output -  Net 711 ml   Last 3 Weights 03/10/2019 03/09/2019 03/08/2019  Weight (lbs) 162 lb 7.7  oz 165 lb 9.1 oz 162 lb 14.7 oz  Weight (kg) 73.7 kg 75.1 kg 73.9 kg      Telemetry    SR   Only - Personally Reviewed  ECG    No new - Personally Reviewed  Physical Exam   General:  Chronically ill appearing on vent through trach No resp difficulty HEENT: normal s/p Trach & cor-trak Neck: supple. no JVD. Carotids 2+ bilat; no bruits. No lymphadenopathy or thryomegaly appreciated. Cor: PMI nondisplaced. Regular rate & rhythm. No rubs, gallops or murmurs. Lungs: coarse Abdomen: soft, nontender, nondistended. No hepatosplenomegaly. No bruits or masses. Good bowel sounds. Extremities: no cyanosis, clubbing, rash, edema Neuro: alert  cranial nerves grossly intact. moves all 4 extremities w/o difficulty. Affect pleasant   Labs    Chemistry Recent Labs  Lab 03/08/19 0352 03/09/19 0418 03/10/19 0448  NA 139 140 141  K 4.3 3.9 3.6  CL 102 102 101  CO2 _0 GLUCOSE 92 99 130*  BUN 46* 43* 35*  CREATININE 1.36* 1.42* 1.21*  CALCIUM 8.8* 8.7* 8.8*  ALBUMIN 2.5* 2.4* 2.4*  GFRNONAA 39* 37* 45*  GFRAA 45* 43*  52*  ANIONGAP _0 Hematology Recent Labs  Lab 03/08/19 0352 03/09/19 0418 03/10/19 0448  WBC 9.7 8.3 8.8  RBC 3.93 3.51* 3.57*  HGB 9.9* 8.9* 9.1*  HCT 34.2* 31.2* 31.7*  MCV 87.0 88.9 88.8  MCH 25.2* 25.4* 25.5*  MCHC 28.9* 28.5* 28.7*  RDW 18.6* 18.4* 18.2*  PLT 289 276 305    Cardiac Enzymes Recent Labs  Lab 03/03/19 1521 03/03/19 2254 03/04/19 0452  TROPONINI 0.11* 0.12* 0.11*   No results for input(s): TROPIPOC in the last 168 hours.   BNP No results for input(s): BNP, PROBNP in the last 168 hours.   DDimer No results for input(s): DDIMER in the last 168 hours.   Radiology    No results found.  Cardiac Studies   Cardiac cath 02/09/19   2nd Mrg-3 lesion is 80% stenosed.  Acute Mrg lesion is 90% stenosed.    Less than 25% left main  Proximal diffuse 30 to 40% narrowing within the previously placed LAD stent.  The  entire apical LAD is diffusely diseased with up to 85% stenosis.  The first diagonal contains 85% ostial narrowing.  Circumflex contains diffuse proximal to mid eccentric 50% stenosis the dominant second obtuse marginal contains mid vessel 50% stenosis in the mid to distal portion of this obtuse marginal there is a patent stent with 20% narrowing, and just distal to the stent there is an 80% thrombotic stenosis.  The RCA is dominant containing 50% mid vessel stenosis, 70% distal stenosis, 70% ostial PDA stenosis, and total occlusion of the mid vessel that fills by collaterals from the left ventricular apex.  Left ventriculography is not performed.  Today's echo was reviewed and there is anteroapical severe hypokinesis and estimated ejection fraction of 40%.  LVEDP by Cath Lab hemodynamics is 40 mmHg.   TTE3/26/20 IMPRESSIONS    1. The left ventricle has moderately reduced systolic function, with an ejection fraction of 35-40%. Left ventricular diastolic Doppler parameters are consistent with restrictive filling. Elevated left atrial and left ventricular end-diastolic pressures  The E/e' is 34. Left ventricular diffuse hypokinesis.  2. Severe akinesis of the left ventricular, entire apical segment.  3. Severe akinesis of the left ventricular, mid-apical anteroseptal wall and anterior wall.  4. Moderate pleural effusion in both left and right lateral regions.  5. There is mild mitral annular calcification present.  6. The aortic valve is grossly normal Aortic valve regurgitation is mild by color flow Doppler.  7. The inferior vena cava was normal in size with <50% respiratory variability.  8. Left atrial size was not assessed.  9. The ascending aorta and aortic root are normal in size and structure.    Patient Profile     73 y.o. female visiting from [place] admitted March 10 with fever, hypotension and found to have group A strep bacteremia requiring intubation.  Also ruled in for  non-ST elevation myocardial infarction. Had cath and medical therapy recommended. Blood cultures March 10 showed group A strep.  Respiratory virus panel showed rhinovirus.  Influenza A and B negative.  Coronavirus negative.  Note LV dysfunction possibly secondary to stress cardiomyopathy.    Assessment & Plan    1. PAF in setting of sepsis and acute illness - patient remains in sinus rhythm on amiodarone and Coreg.   - no anticoagulation due to history of low hemoglobin. Can reconsider if more AF .  2. CAD  NSTEMI     - Left heart catheterization  as above with diffuse disease and no intervention.  Continue ASA and statin.  Potentially will not tolerate Plavix due to drop in hemoglobin. Ca restart when more stable  3. Anemia- - hemoglobin back up to 9.1 and has remained stable.  4. Acute on chronic systolic and diastolic HF  - EF 79-49% by echo 02/20/19  - IV lasix stopped on 4/10 CVP 3-4 - Wil start spiro 12.40m daily and losartan 25 mg daily - Continue carvedilol .  - Can add lasix 458mdaily as needed to manage volume  5. Acute respiratory failure  - s/p trach 3/31.  Has strep bacteremia. - per primary team   Cardiology will sign off. Continue current meds. Please call with questions.   For questions or updates, please contact [facility] consult [email or web address] for contact info under    [facility] sign off.   Medication Recommendations:  Continue aspirin, diuresis Other recommendations (labs, testing, etc):  none Follow up as an outpatient:  To be scheduled     Signed, DaGlori BickersMD  03/10/2019, 8:25 AM

## 2019-03-11 ENCOUNTER — Inpatient Hospital Stay (HOSPITAL_COMMUNITY): Payer: Medicaid Other

## 2019-03-11 LAB — RENAL FUNCTION PANEL
Albumin: 2.3 g/dL — ABNORMAL LOW (ref 3.5–5.0)
Anion gap: 10 (ref 5–15)
BUN: 41 mg/dL — ABNORMAL HIGH (ref 8–23)
CO2: 27 mmol/L (ref 22–32)
Calcium: 8.4 mg/dL — ABNORMAL LOW (ref 8.9–10.3)
Chloride: 104 mmol/L (ref 98–111)
Creatinine, Ser: 1.43 mg/dL — ABNORMAL HIGH (ref 0.44–1.00)
GFR calc Af Amer: 42 mL/min — ABNORMAL LOW (ref >60)
GFR calc non Af Amer: 36 mL/min — ABNORMAL LOW (ref >60)
Glucose, Bld: 218 mg/dL — ABNORMAL HIGH (ref 70–99)
Phosphorus: 4.7 mg/dL — ABNORMAL HIGH (ref 2.5–4.6)
Potassium: 4 mmol/L (ref 3.5–5.1)
Sodium: 141 mmol/L (ref 135–145)

## 2019-03-11 LAB — GLUCOSE, CAPILLARY
Glucose-Capillary: 151 mg/dL — ABNORMAL HIGH (ref 70–99)
Glucose-Capillary: 170 mg/dL — ABNORMAL HIGH (ref 70–99)
Glucose-Capillary: 170 mg/dL — ABNORMAL HIGH (ref 70–99)
Glucose-Capillary: 189 mg/dL — ABNORMAL HIGH (ref 70–99)
Glucose-Capillary: 206 mg/dL — ABNORMAL HIGH (ref 70–99)
Glucose-Capillary: 210 mg/dL — ABNORMAL HIGH (ref 70–99)
Glucose-Capillary: 230 mg/dL — ABNORMAL HIGH (ref 70–99)

## 2019-03-11 MED ORDER — ASPIRIN EC 81 MG PO TBEC
81.0000 mg | DELAYED_RELEASE_TABLET | Freq: Every day | ORAL | Status: DC
  Administered 2019-03-12 – 2019-03-16 (×5): 81 mg via ORAL
  Filled 2019-03-11 (×5): qty 1

## 2019-03-11 MED ORDER — ASPIRIN 81 MG PO CHEW
CHEWABLE_TABLET | ORAL | Status: AC
  Administered 2019-03-11: 81 mg
  Filled 2019-03-11: qty 1

## 2019-03-11 NOTE — Progress Notes (Signed)
PROGRESS NOTE    Gabrielle Wright  OIZ:[id number] DOB: [birth year] DOA: 02/03/2019 PCP: Rudene Anda, MD    Brief Narrative:  73yo female with hx of HTN, possible DM, CAD presented as a transfer to critical care service with group A strep bacteremia requiring intubation and mechanical ventillation. Complicated by NSTEMI and systolic dysfunction. Pt noted to have difficulty with extubation secondary to volume overload. Pt now s/p trach 3/31  Assessment & Plan:   Principal Problem:   CAD (coronary artery disease), native coronary artery Active Problems:   Sepsis ([place])   Hypotension   Acute respiratory failure with hypoxemia (HCC)   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 (HCC)   Septic shock (HCC)   Acute on chronic respiratory failure with hypoxia (HCC)   AKI (acute kidney injury) ([place])   Abdominal distension (gaseous)   HCAP (healthcare-associated pneumonia)   Acute pulmonary edema ([place])   Tracheostomy status ([place])   1. Acute respiratory failure s/p trach 1. Had remained intubated, difficult to wean secondary to volume overload 2. Recently diuresed aggressively  3. Renal function stable 4. PCCM following. Continuing with trach collar trials per PCCM 2. Hypernatremia 1. Resolved. Labs reviewed 2. Receiving free water flushes 3. Recheck bmet in AM 3. Ischemic cardiomyopathy 1. Cardiology had been following, since signed off 2. ASA and  plavix currently on hold 3. Remains stable this AM 4. Afib RVR 1. Cardiology had been following, now signed off 2. Rate controlled on amiodarone and coreg 3. Continue off anticoagulation unless afib recurs per Cardiology 4. HR remains stable thus far 5. DM2 1. Glucose stable 2. Continue on SSI coverage as needed 6. Dysphagia 1. Currently receiving nutrition through cortrak 2. Nutrition had been following 3. Seems stable at present  DVT prophylaxis: SCD's Code Status: Full Family  Communication: Pt in room, family not at bedside Disposition Plan: Uncertain, hopeful SNF  Consultants:   Critical Care  Cardiology  Procedures:   Heart Cath 3/15  Extubated 3/18  Re-intubated 3/30  Trach 3/31  Antimicrobials: Anti-infectives (From admission, onward)   Start     Dose/Rate Route Frequency Ordered Stop   02/19/19 1200  ciprofloxacin (CIPRO) IVPB 400 mg     400 mg 200 mL/hr over 60 Minutes Intravenous Every 12 hours 02/19/19 1119 02/25/19 1321   02/18/19 1230  ciprofloxacin (CIPRO) IVPB 400 mg  Status:  Discontinued     400 mg 200 mL/hr over 60 Minutes Intravenous Every 24 hours 02/18/19 1127 02/19/19 1119   02/14/19 0900  cefTRIAXone (ROCEPHIN) 2 g in sodium chloride 0.9 % 100 mL IVPB     2 g 200 mL/hr over 30 Minutes Intravenous Every 24 hours 02/14/19 0846 02/16/19 1033   02/06/19 1900  penicillin G potassium 8 Million Units in dextrose 5 % 500 mL continuous infusion  Status:  Discontinued     8 Million Units 41.7 mL/hr over 12 Hours Intravenous Every 12 hours 02/06/19 1900 02/14/19 0846   02/06/19 1100  penicillin G potassium 8 Million Units in dextrose 5 % 500 mL continuous infusion  Status:  Discontinued     8 Million Units 41.7 mL/hr over 12 Hours Intravenous Every 12 hours 02/06/19 1008 02/06/19 1900   02/05/19 2300  penicillin G potassium 4 Million Units in dextrose 5 % 250 mL IVPB  Status:  Discontinued     4 Million Units 250 mL/hr over 60 Minutes Intravenous Every 6 hours 02/04/19 2216 02/04/19 2237   02/04/19 2300  vancomycin (VANCOCIN) 1,500 mg in sodium chloride 0.9 % 500 mL IVPB     1,500 mg 250 mL/hr over 120 Minutes Intravenous  Once 02/04/19 2216 02/05/19 0308   02/04/19 2300  penicillin G potassium 4 Million Units in dextrose 5 % 250 mL IVPB  Status:  Discontinued     4 Million Units 250 mL/hr over 60 Minutes Intravenous Every 6 hours 02/04/19 2237 02/06/19 1008   02/04/19 2230  clindamycin (CLEOCIN) IVPB 600 mg  Status:  Discontinued      600 mg 100 mL/hr over 30 Minutes Intravenous Every 8 hours 02/04/19 2216 02/05/19 0917   02/04/19 0315  piperacillin-tazobactam (ZOSYN) IVPB 3.375 g     3.375 g 100 mL/hr over 30 Minutes Intravenous  Once 02/04/19 0303 02/04/19 0408   02/04/19 0315  vancomycin (VANCOCIN) IVPB 1000 mg/200 mL premix     1,000 mg 200 mL/hr over 60 Minutes Intravenous  Once 02/04/19 0303 02/04/19 0424      Subjective: Multiple BM noted overnight. Patient unable to vocalize however is pointing to trach  Objective: Vitals:   03/11/19 0800 03/11/19 0900 03/11/19 0913 03/11/19 1000  BP: (!) 97/47 (!) 103/51 (!) 103/51 (!) 117/45  Pulse: 74 71 79 89  Resp: 12 13 19 17   Temp:      TempSrc:      SpO2: 100% 100% 100% 100%  Weight:      Height:        Intake/Output Summary (Last 24 hours) at 03/11/2019 1056 Last data filed at 03/11/2019 0851 Gross per 24 hour  Intake 656.67 ml  Output -  Net 656.67 ml   Filed Weights   03/09/19 0500 03/10/19 0500 03/11/19 0700  Weight: 75.1 kg 73.7 kg 72.9 kg    Examination: General exam: Awake, laying in bed, in nad Respiratory system: Normal respiratory effort, no wheezing Cardiovascular system: regular rate, s1, s2 Gastrointestinal system: Soft, nondistended, positive BS Central nervous system: CN2-12 grossly intact, strength intact Extremities: Perfused, no clubbing Skin: Normal skin turgor, no notable skin lesions seen Psychiatry: Unable to assess as pt is non-verbal  Data Reviewed: I have personally reviewed following labs and imaging studies  CBC: Recent Labs  Lab 03/06/19 0318 03/07/19 0434 03/08/19 0352 03/09/19 0418 03/10/19 0448  WBC 10.3 9.0 9.7 8.3 8.8  NEUTROABS 7.4  --   --   --   --   HGB 7.3* 9.2* 9.9* 8.9* 9.1*  HCT 26.5* 31.8* 34.2* 31.2* 31.7*  MCV 88.3 86.6 87.0 88.9 88.8  PLT 211 227 289 276 233   Basic Metabolic Panel: Recent Labs  Lab 03/07/19 0434  03/07/19 2056 03/08/19 0352 03/08/19 1810 03/09/19 0418 03/10/19  0448 03/11/19 0416  NA 142  --   --  139  --  140 141 141  K 3.8  --   --  4.3  --  3.9 3.6 4.0  CL 100  --   --  102  --  102 101 104  CO2 27  --   --  26  --  28 26 27   GLUCOSE 131*  --   --  92  --  99 130* 218*  BUN 43*  --   --  46*  --  43* 35* 41*  CREATININE 1.30*  --   --  1.36*  --  1.42* 1.21* 1.43*  CALCIUM 8.6*  --   --  8.8*  --  8.7* 8.8* 8.4*  MG 2.4   < > 2.3 2.3  2.5* 2.4 2.3  --   PHOS 5.2*   < > 6.2* 5.4* 5.6* 5.7* 4.1 4.7*   < > = values in this interval not displayed.   GFR: Estimated Creatinine Clearance: 36.3 mL/min (A) (by C-G formula based on SCr of 1.43 mg/dL (H)). Liver Function Tests: Recent Labs  Lab 03/07/19 0434 03/08/19 0352 03/09/19 0418 03/10/19 0448 03/11/19 0416  ALBUMIN 2.5* 2.5* 2.4* 2.4* 2.3*   No results for input(s): LIPASE, AMYLASE in the last 168 hours. No results for input(s): AMMONIA in the last 168 hours. Coagulation Profile: No results for input(s): INR, PROTIME in the last 168 hours. Cardiac Enzymes: No results for input(s): CKTOTAL, CKMB, CKMBINDEX, TROPONINI in the last 168 hours. BNP (last 3 results) No results for input(s): PROBNP in the last 8760 hours. HbA1C: No results for input(s): HGBA1C in the last 72 hours. CBG: Recent Labs  Lab 03/10/19 1526 03/10/19 1938 03/10/19 2336 03/11/19 0343 03/11/19 0745  GLUCAP 104* 183* 170* 170* 189*   Lipid Profile: No results for input(s): CHOL, HDL, LDLCALC, TRIG, CHOLHDL, LDLDIRECT in the last 72 hours. Thyroid Function Tests: No results for input(s): TSH, T4TOTAL, FREET4, T3FREE, THYROIDAB in the last 72 hours. Anemia Panel: No results for input(s): VITAMINB12, FOLATE, FERRITIN, TIBC, IRON, RETICCTPCT in the last 72 hours. Sepsis Labs: No results for input(s): PROCALCITON, LATICACIDVEN in the last 168 hours.  No results found for this or any previous visit (from the past 240 hour(s)).   Radiology Studies: Dg Chest Port 1 View  Result Date: 03/11/2019 CLINICAL  DATA:  Shortness of breath EXAM: PORTABLE CHEST 1 VIEW COMPARISON:  March 06, 2019 FINDINGS: Tracheostomy catheter tip is 4.4 cm above the carina. Central catheter tip is in the right atrium slightly beyond the superior vena cava-right atrium junction. Feeding tube tip is below the diaphragm with tip not seen. No pneumothorax. There is consolidation in the left base with small left pleural effusion. There is mild right base atelectasis. Heart is mildly enlarged with pulmonary vascularity normal. There is aortic atherosclerosis. No adenopathy. No bone lesions. IMPRESSION: Tube and catheter positions as described without pneumothorax. Consolidation in a portion of the left lower lobe, likely pneumonia, with small left pleural effusion. Mild right base atelectasis. Stable cardiac silhouette.  Aortic Atherosclerosis (ICD10-I70.0). Electronically Signed   By: Lowella Grip III M.D.   On: 03/11/2019 10:19    Scheduled Meds: . sodium chloride   Intravenous Once  . amiodarone  200 mg Per Tube Daily  . aspirin EC  81 mg Oral Daily  . atorvastatin  40 mg Per Tube q1800  . carvedilol  3.125 mg Per NG tube BID WC  . chlorhexidine gluconate (MEDLINE KIT)  15 mL Mouth Rinse BID  . Chlorhexidine Gluconate Cloth  6 each Topical Q0600  . clonazepam  0.5 mg Per Tube TID  . feeding supplement (GLUCERNA 1.5 CAL)  1,000 mL Per Tube Q24H  . feeding supplement (PRO-STAT SUGAR FREE 64)  30 mL Per Tube Daily  . hydrALAZINE  10 mg Per Tube Q8H  . insulin aspart  0-24 Units Subcutaneous Q4H  . isosorbide dinitrate  10 mg Per Tube TID  . losartan  25 mg Per Tube Daily  . mouth rinse  15 mL Mouth Rinse 10 times per day  . multivitamin  15 mL Per Tube Daily  . pantoprazole (PROTONIX) IV  40 mg Intravenous Q12H  . polyethylene glycol  17 g Oral BID  . psyllium  1 packet Per  Tube Daily  . QUEtiapine  25 mg Per Tube QHS  . sodium chloride flush  10-40 mL Intracatheter Q12H  . spironolactone  12.5 mg Per Tube Daily    Continuous Infusions: . sodium chloride Stopped (03/04/19 0815)     LOS: 35 days   Marylu Lund, MD [facility] Pager On Amion  If 7PM-7AM, please contact night-coverage 03/11/2019, 10:56 AM

## 2019-03-11 NOTE — Progress Notes (Signed)
NAME:  Erandy Philbert, MRN:  [id number], DOB:  08/04/1946, LOS: 35 ADMISSION DATE:  02/03/2019, CONSULTATION DATE: 02/04/2019 REFERRING MD: Emergency department physician CHIEF COMPLAINT: Fever respiratory failure  Brief History   73 year old elderly woman visiting from [place], admitted 3/10 with fevers, hypotension, found to have group A strep bacteremia, required mechanical ventilation Course complicated by non-STEMI and new LV dysfunction ? Ischemic vs Takatsubo's Failed extubation 3/18 due to acute pulmonary edema. S/p trach 3/30.  Past Medical History  Hypertension Coronary artery disease Suspected diabetes.  Significant Hospital Is Events   02/04/2019 transfer from [facility] to [facility] required intubation and pressors. 3/16 off levophed 3/18 extubated briefly but reintubated due to pulmonary edema 3/19 started milrinone 3/20 Episode of respiratory distress,  Precedex changed to propofol Atrial fibrillation RVR >>amiodarone bolus >> sinus rhythm 3/21 rising creat , AF-RVR >> amio bolus 3/24:rising creatinine 3/30: reintubated 3/31 trach 4/1 PICC  Consults:  02/04/2019 ID  IR  Cardiology  Procedures:  ETT 3/10 > 3/18, 3/18 > 3/31 Trach 3/31 > CVL 3/10 > 4/3 PICC 4/1 >   Significant Diagnostic Tests:  Echo 3/15 decreased EF 35 to 40%, LVEDP 27, no significant MR Cardiac cath >> LVEDP 40, RCA totally occluded, patent OM1 stent but distally occluded, apical LAD diffusely diseased, no intervention Chest x-ray 3/22  bilateral lower lobe airspace disease versus layering effusions CXR 3/23> pulmonary edema and bilateral pleural effusions. Echo 3/26 limited> EF 35-40%, elevated LA and LV end-diastolic pressures, Diffuse hypokinesis of LV CXR 4/1> bibasilar opacities, small R pleural effusion, tracheostomy tube present and appropriately positioned  CXR 4/9> bilateral infiltrates with R pleural effusion  Micro Data:  02/04/2019 blood cultures x2>> Gr A  strep 02/04/2019 sputum culture>> neg 02/04/2019 urine culture>>neg 02/04/2019 respiratory virus panel>> rhinovirus 02/04/2019 flu a and B>> neg coronavirus testing 02/04/2019>>neg resp 3/21 >> Klebsiella oxytoca, co-ag negative staph  Antimicrobials:  02/04/2019 vancomycin>>3/ 10 02/04/2019 Zosyn>> 3/10 Pen G 3/10 >> 3/22 (plan) Ceftx 3/20 >>3/24 Cipro 3/24>> 3/31  Interim history/subjective:  No acute events over night. Tolerated trach collar for 1.5hrs yesterday morning.  Objective   Blood pressure (!) 97/47, pulse 74, temperature 98.5 F (36.9 C), temperature source Oral, resp. rate 12, height 5\' 6"  (1.676 m), weight 72.9 kg, SpO2 100 %. CVP:  [4 mmHg-9 mmHg] 9 mmHg  Vent Mode: PRVC FiO2 (%):  [40 %] 40 % Set Rate:  [12 bmp] 12 bmp Vt Set:  [450 mL] 450 mL PEEP:  [5 cmH20] 5 cmH20 Plateau Pressure:  [15 cmH20-18 cmH20] 17 cmH20   Intake/Output Summary (Last 24 hours) at 03/11/2019 0852 Last data filed at 03/11/2019 0851 Gross per 24 hour  Intake 943.67 ml  Output -  Net 943.67 ml   Filed Weights   03/09/19 0500 03/10/19 0500 03/11/19 0700  Weight: 75.1 kg 73.7 kg 72.9 kg   Examination:  General: adult female on vent Neuro: Wakes to voice HEENT: [place]/AT. No JVD. Trach in place.  Cardiovascular: RRR, no MRG.  Lungs: CTAB, no increased work of breathing. Abdomen: soft, distended, non tender  Musculoskeletal: No gross deformities, no edema.  Skin: Grossly intact.   Assessment & Plan:   Acute respiratory failure requiring mechanical ventilation - s/p trach 3/31. Weaned on trach collar for several hours yesterday. Will try to do trach collar 2hrs at a time about twice daily. Appears euvolemic. -trach collar again today 2 hours twice a day; tomorrow will increase to 3 hours twice a day, etc  Ischemic Cardiomyopathy (Echo 3/26 EF 35-40%, LV diffuse hypokinesis) - no targets for revascularization on cath - Continue coreg, hydralazine, imdur, asa; started on losartan and  spironolactone per cards yesterday. Holding plavix 2/2 Hgb drop and ?bloody NG output last week; since then Hgb stable and no signs of bleeding  Anemia: Report of mild bloody NG output and Hgb drop last week from 8.1>7.3; s/p 1u pRBC per primary. Stable Hgb since then and no signs of bleeding. ASA restarted but still holding plavix, per primary and cards.  Atrial Fibrillation with RVR - resolved. - Continue amio, coreg. No anticoagulation per cards as thought to be 2/2 acute illness and sepsis.   Diabetes type 2/uncontrolled hyperglycemia. - SSI resistant.  Intermittent agitation - exacerbated by language barrier (family video conference has seemed to help).   - Continue clonazepam - Seroquel QHS - PT to work with patient.  - Establish good sleep wake cycle.   Dysphagia:  - Continue TF Cortrak. May need to consider PEG over the next few days if not able to tolerate trach collar consistently.   CM on board for LTAC options.   GI ppx: Protonix VAP: per protocol Pain/Sedation: PRN clonazepam DVT ppx: SCDs  Diet: Enteral Nutrition Code Status: full   Nyra Market, MD PGY3 Pager [phone]  03/11/2019 8:52 AM

## 2019-03-11 NOTE — Progress Notes (Signed)
SLP Cancellation Note  Patient Details Name: Gabrielle Wright MRN: [id number] DOB: 1946/04/02   Cancelled treatment:       Reason Eval/Treat Not Completed: Medical issues which prohibited therapy - returned to vent. Note that pt is doing limited trials of TC. Discussed with RN, with plan to notify SLP when pt returns to [facility] to see if PMV eval can be completed. Will continue efforts to coordinate.   [name] [name] 03/11/2019, 1:45 PM  [name], M.A. CCC-SLP Acute Herbalist [phone] Office [phone]

## 2019-03-11 NOTE — Progress Notes (Signed)
RT removed pt from vent placed pt on ATC trial. 35% ATC - pt in no distress. Pt tol well

## 2019-03-11 NOTE — Progress Notes (Signed)
Physical Therapy Treatment Patient Details Name: Gabrielle Wright MRN: [id number] DOB: 1946/10/02 Today's Date: 03/11/2019    History of Present Illness 73 y.o. female admitted on 02/03/19 for fever and cough (to [facility]) and was transferred to [facility] on 02/04/19 for hypotension and respiratory distress (acute respiratory failure/pulmonary edema) requiring intubation 3/10-current (time of PT eval 02/27/2019).  She was visiting family from [place] and COVID 19 tests were negative.  She was found to have group A strep bacteremia (septic shiock), rhinovirus,  and course complicated by NSTEMI and new LV dysfunction s/p cardiac cath on 02/09/19.  Cardiology following.  Other dx include ischemic cardiomyopathy, hyperkalemia, DM2- uncontrolled, and abdominal distention.  Pt with significant PMH of DM, HTN, anc CAD. Trach placed on 3/31    PT Comments    Used an interpreter for Gabrielle Wright during the session.  Pt followed direction well and even appeared to understand some english commands.  Pt complained of a HA throughout and asked to have her hair pulled to remedy it, but instead, pt was given a head message.  Emphasis was on scooting to the edge of the chair, sit to stands, standing upright with w/shifting and stepping in the EVA walker.    Follow Up Recommendations  SNF     Equipment Recommendations  Other (comment)    Recommendations for Other Services       Precautions / Restrictions Precautions Precautions: Fall Precaution Comments: trach, vent    Mobility  Bed Mobility               General bed mobility comments: up in the chair on arrival  Transfers Overall transfer level: Needs assistance Equipment used: (into an EVA walker) Transfers: Sit to/from Stand Sit to Stand: Max assist;+2 physical assistance         General transfer comment: cues for hand placement  Ambulation/Gait Ambulation/Gait assistance: Max assist;+2 safety/equipment   Assistive device: (EVA  walker) Gait Pattern/deviations: Step-to pattern     General Gait Details: short tentative unsteady steps haning moderately heavy from the EVA walker   Stairs             Wheelchair Mobility    Modified Rankin (Stroke Patients Only)       Balance Overall balance assessment: Needs assistance Sitting-balance support: Bilateral upper extremity supported Sitting balance-Leahy Scale: Poor Sitting balance - Comments: in chair, pt needs arm rests or external assist Postural control: Posterior lean Standing balance support: Bilateral upper extremity supported;During functional activity Standing balance-Leahy Scale: Poor Standing balance comment: relaint on external support or AD                            Cognition Arousal/Alertness: Awake/alert Behavior During Therapy: WFL for tasks assessed/performed Overall Cognitive Status: Difficult to assess                                 General Comments: pt follows interpretors comments and follows simple commands consistently.with time      Exercises Other Exercises Other Exercises: warm up hip/knee flex/ext ROM with graded resistance where able.    General Comments        Pertinent Vitals/Pain Pain Assessment: Faces Faces Pain Scale: Hurts even more Pain Location: head Pain Descriptors / Indicators: Aching Pain Intervention(s): Monitored during session    Home Living  Prior Function            PT Goals (current goals can now be found in the care plan section) Acute Rehab PT Goals Patient Stated Goal: agreeable to working with therapies PT Goal Formulation: Patient unable to participate in goal setting Time For Goal Achievement: 03/13/19 Potential to Achieve Goals: Fair Progress towards PT goals: Progressing toward goals    Frequency    Min 2X/week      PT Plan Current plan remains appropriate    Co-evaluation              AM-PAC PT "6  Clicks" Mobility   Outcome Measure  Help needed turning from your back to your side while in a flat bed without using bedrails?: A Lot Help needed moving from lying on your back to sitting on the side of a flat bed without using bedrails?: A Lot Help needed moving to and from a bed to a chair (including a wheelchair)?: A Lot Help needed standing up from a chair using your arms (e.g., wheelchair or bedside chair)?: A Lot Help needed to walk in hospital room?: A Lot Help needed climbing 3-5 steps with a railing? : Total 6 Click Score: 11    End of Session   Activity Tolerance: Patient limited by fatigue Patient left: with call bell/phone within reach;in chair;with chair alarm set Nurse Communication: Mobility status PT Visit Diagnosis: Muscle weakness (generalized) (M62.81);Difficulty in walking, not elsewhere classified (R26.2)     Time: 9563-8756 PT Time Calculation (min) (ACUTE ONLY): 27 min  Charges:  $Therapeutic Activity: 23-37 mins                     03/11/2019  [place] Bing, PT Acute Rehabilitation Services [phone]  (pager) [phone]  (office)   Gabrielle Wright Gabrielle Wright 03/11/2019, 5:17 PM

## 2019-03-11 NOTE — Progress Notes (Addendum)
RT placed pt back on full support with same settings. Pt weaned ATC 35% for 2hrs per MD. Per MD order 2hrs atc BID.

## 2019-03-11 NOTE — Progress Notes (Signed)
CSW alerted that [facility] in [place] offered a bed for the patient for vent SNF, would like to talk to the family. CSW discussed with patient's daughter and son in law. Patient's daughter and son in law were confused and unhappy during discussion, indicated that they did not realize that a facility could be so far away. Patient's daughter refusing to allow the SNF to contact her, refusing to acknowledge it as an option. CSW indicated that other facilities that were closer were contacted, and there were no beds available. Daughter said that she believed that the patient was trying to be weaned off of the vent, and that would allow for more facility options, so she was hoping to see how that continued to go before making any facility decisions.   CSW to continue to follow. No other offers at this time.  Blenda Nicely, [place] Clinical Social Worker [phone]

## 2019-03-12 LAB — GLUCOSE, CAPILLARY
Glucose-Capillary: 169 mg/dL — ABNORMAL HIGH (ref 70–99)
Glucose-Capillary: 179 mg/dL — ABNORMAL HIGH (ref 70–99)
Glucose-Capillary: 221 mg/dL — ABNORMAL HIGH (ref 70–99)
Glucose-Capillary: 162 mg/dL — ABNORMAL HIGH (ref 70–99)
Glucose-Capillary: 244 mg/dL — ABNORMAL HIGH (ref 70–99)

## 2019-03-12 LAB — RENAL FUNCTION PANEL
Albumin: 2.4 g/dL — ABNORMAL LOW (ref 3.5–5.0)
Anion gap: 11 (ref 5–15)
BUN: 43 mg/dL — ABNORMAL HIGH (ref 8–23)
CO2: 25 mmol/L (ref 22–32)
Calcium: 8.4 mg/dL — ABNORMAL LOW (ref 8.9–10.3)
Chloride: 105 mmol/L (ref 98–111)
Creatinine, Ser: 1.35 mg/dL — ABNORMAL HIGH (ref 0.44–1.00)
GFR calc Af Amer: 45 mL/min — ABNORMAL LOW (ref >60)
GFR calc non Af Amer: 39 mL/min — ABNORMAL LOW (ref >60)
Glucose, Bld: 239 mg/dL — ABNORMAL HIGH (ref 70–99)
Phosphorus: 3.4 mg/dL (ref 2.5–4.6)
Potassium: 3.8 mmol/L (ref 3.5–5.1)
Sodium: 141 mmol/L (ref 135–145)

## 2019-03-12 LAB — CBC
HCT: 31.7 % — ABNORMAL LOW (ref 36.0–46.0)
Hemoglobin: 9 g/dL — ABNORMAL LOW (ref 12.0–15.0)
MCH: 25.2 pg — ABNORMAL LOW (ref 26.0–34.0)
MCHC: 28.4 g/dL — ABNORMAL LOW (ref 30.0–36.0)
MCV: 88.8 fL (ref 80.0–100.0)
Platelets: 306 10*3/uL (ref 150–400)
RBC: 3.57 MIL/uL — ABNORMAL LOW (ref 3.87–5.11)
RDW: 17.8 % — ABNORMAL HIGH (ref 11.5–15.5)
WBC: 10.4 10*3/uL (ref 4.0–10.5)
nRBC: 0 % (ref 0.0–0.2)

## 2019-03-12 LAB — MAGNESIUM: Magnesium: 2.7 mg/dL — ABNORMAL HIGH (ref 1.7–2.4)

## 2019-03-12 MED ORDER — GUAIFENESIN 100 MG/5ML PO SOLN
5.0000 mL | ORAL | Status: DC | PRN
  Administered 2019-03-12 – 2019-03-27 (×23): 100 mg via ORAL
  Filled 2019-03-12 (×21): qty 5
  Filled 2019-03-12: qty 25
  Filled 2019-03-12 (×2): qty 5

## 2019-03-12 MED ORDER — TRAZODONE HCL 50 MG PO TABS
50.0000 mg | ORAL_TABLET | Freq: Every day | ORAL | Status: DC
  Administered 2019-03-12 – 2019-03-20 (×9): 50 mg
  Filled 2019-03-12 (×9): qty 1

## 2019-03-12 MED ORDER — INSULIN GLARGINE 100 UNIT/ML ~~LOC~~ SOLN
10.0000 [IU] | Freq: Every day | SUBCUTANEOUS | Status: DC
  Filled 2019-03-12: qty 0.1

## 2019-03-12 MED ORDER — SERTRALINE HCL 20 MG/ML PO CONC
25.0000 mg | Freq: Every day | ORAL | Status: DC
  Filled 2019-03-12: qty 1.25

## 2019-03-12 MED ORDER — CLONAZEPAM 0.5 MG PO TBDP
1.0000 mg | ORAL_TABLET | Freq: Three times a day (TID) | ORAL | Status: DC
  Administered 2019-03-12 – 2019-03-13 (×5): 1 mg
  Filled 2019-03-12 (×5): qty 2

## 2019-03-12 MED ORDER — SERTRALINE HCL 25 MG PO TABS
25.0000 mg | ORAL_TABLET | Freq: Every day | ORAL | Status: DC
  Administered 2019-03-12 – 2019-03-15 (×4): 25 mg
  Filled 2019-03-12 (×6): qty 1

## 2019-03-12 MED ORDER — INSULIN GLARGINE 100 UNIT/ML ~~LOC~~ SOLN
15.0000 [IU] | Freq: Every day | SUBCUTANEOUS | Status: DC
  Administered 2019-03-12: 15 [IU] via SUBCUTANEOUS
  Filled 2019-03-12 (×2): qty 0.15

## 2019-03-12 NOTE — Evaluation (Signed)
[facility] Speaking Valve - Evaluation Patient Details  Name: Gabrielle Wright MRN: [id number] Date of Birth: 20-Mar-1946  Today's Date: 03/12/2019 Time: 1165-7903 SLP Time Calculation (min) (ACUTE ONLY): 34 min  Past Medical History:  Past Medical History:  Diagnosis Date  . Coronary artery disease   . Diabetes mellitus without complication (HCC)   . Hypertension    Past Surgical History:  Past Surgical History:  Procedure Laterality Date  . CORONARY ANGIOPLASTY WITH STENT PLACEMENT    . CORONARY/GRAFT ACUTE MI REVASCULARIZATION N/A 02/09/2019   Procedure: Coronary/Graft Acute MI Revascularization;  Surgeon: Lyn Records, MD;  Location: [facility] INVASIVE CV LAB;  Service: Cardiovascular;  Laterality: N/A;  . LEFT HEART CATH AND CORONARY ANGIOGRAPHY N/A 02/09/2019   Procedure: LEFT HEART CATH AND CORONARY ANGIOGRAPHY;  Surgeon: Lyn Records, MD;  Location: [facility] INVASIVE CV LAB;  Service: Cardiovascular;  Laterality: N/A;   HPI:  73 y.o. female admitted on 02/03/19 for fever and cough (to [facility]) and was transferred to [facility] on 02/04/19 for hypotension and respiratory distress (acute respiratory failure/pulmonary edema) requiring intubation 3/10-current (time of PT eval 02/27/2019).  She was visiting family from [place] and COVID 19 tests were negative.  She was found to have group A strep bacteremia (septic shiock), rhinovirus,  and course complicated by NSTEMI and new LV dysfunction s/p cardiac cath on 02/09/19.  Cardiology following.  Other dx include ischemic cardiomyopathy, hyperkalemia, DM2- uncontrolled, and abdominal distention.  Pt with significant PMH of DM, HTN, anc CAD. Trach placed on 3/31   Assessment / Plan / Recommendation Clinical Impression  PMV use was limited by language barrier despite use of both teleinterpreter [phone]) and family via elink. She also appears very anxious with attempts at [facility] placement, shaking her head no repeatedly. Cuff was deflated for 18 minutes  under direct SLP supervision, with VS stable and phonation produced with finger occlusion. PMV was placed for <60 seconds before being ejected off the trach hub during coughing. While it was on, pt does not make attempts to respond to questioning verbally, but does repeat sounds when cued. Recommend deflating cuff was tolerated while pt is off the vent to allow her to readjust to airflow through her upper airway. Will continue efforts with PMV, hopefully for longer trials. 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.1)    SLP Assessment  Patient needs continued Speech Lanaguage Pathology Services    Follow Up Recommendations  Skilled Nursing facility    Frequency and Duration min 2x/week  2 weeks    PMSV Trial PMSV was placed for: <60 seconds Able to redirect subglottic air through upper airway: Yes Able to Attain Phonation: Yes Voice Quality: Hoarse Able to Expectorate Secretions: Yes Level of Secretion Expectoration with PMSV: Oral;Tracheal Breath Support for Phonation: Other (comment)(limtied assessment) Intelligibility: Unable to assess (comment) Respirations During Trial: 16 SpO2 During Trial: 100 % Pulse During Trial: 92 Behavior: Alert;Anxious   Tracheostomy Tube       Vent Dependency  FiO2 (%): 35 %    Cuff Deflation Trial  GO Tolerated Cuff Deflation: Yes Length of Time for Cuff Deflation Trial: 18 min Behavior: Alert;Anxious        Skip Mayer 03/12/2019, 11:58 AM   [name], M.A. CCC-SLP Acute Herbalist [phone] Office [phone]

## 2019-03-12 NOTE — Progress Notes (Signed)
Assisted tele visit to patient with family members.  Gabrielle Wooldridge Anderson, RN   

## 2019-03-12 NOTE — Progress Notes (Addendum)
PROGRESS NOTE    Gabrielle Wright  DPO:[id number] DOB: October 29, 1946 DOA: 02/03/2019 PCP: Rudene Anda, MD   Brief Narrative:  73 year old female PMHx from [place]. HTN , CAD, DM Type 2?  Noted to have a high fever 103.9. Reported to have cough. Taken to [place] center per family. Was supported to have a urinary tract infection although her urine was clean. She never filled her prescription for antibiotics. Due to persistent hypotension and refractory nature of her hypertension she is transferred to [facility] 02/04/2019. She was in respiratory distress for abdominal chest wall paradoxus. She required intubation and central line placement. She had maxed out on peripheral IV vasopressors therefore central line was placed. Pulmonary critical care will be responsible for care at this time. She was placed in isolation since she came from a 29 entry of [place] with recent reports of coronavirus and that country. Note [place] is not on the list of infected countries. Infectious disease was called and they reported again diagnosis done on 1 reported countries.     Subjective: 4/15    patient much more awake and interactive this a.m.  Able to ask for a warm blanket, and for [place] to turn off the overhead lights.  Per RN patient still pulling at some of her lines.   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   Indicating she wants trach collar out. -Secondary to ischemic cardiomyopathy, pulmonary edema. - Patient tolerating weaning today - See ischemic cardiomyopathy  Ischemic cardiomyopathy-acute on chronic systolic and diastolic CHF - 4/31 echocardiogram EF 35 to 40%, LV diffuse hypokinesis, no targets for revascularization on catheterization - Cardiology following  - 4/15 CVP 6-7  -Strict in and out +2.5 L -Daily weight Filed Weights   03/09/19 0500 03/10/19 0500 03/11/19 0700  Weight: 75.1 kg 73.7 kg 72.9 kg  -Amiodarone 200 mg daily -Coreg 3.125 mg twice daily -Plavix 75 mg daily (hold) -Hydralazine 10 mg 3 times daily -Isosorbide dinitrate 10 mg 3 times daily -4/9 transfuse 1 unit PRBC -Transfuse for hemoglobin<8  A. fib with RVR - See ischemic cardiomyopathy  Hypernatremia - Resolved  Diabetes type 2 uncontrolled with complication - 5/40 hemoglobin A1c= 10.1 - 4/15 increase Lantus 15 units daily - Custom SSI   Agitation/Anxiety -Exacerbated by language barrier.  National language? - Clonazepam 1 mg 3 times daily - DC Seroquel -During the day all shades and lights on  Insomnia  -Trazodone 50 mg daily  Dysphasia - Feedings per core track on hold secondary to abdominal pain.  GI bleed Recent Labs  Lab 03/06/19 0318 03/07/19 0434 03/08/19 0352 03/09/19 0418 03/10/19 0448  HGB 7.3* 9.2* 9.9* 8.9* 9.1*  -Resolved patient's hemoglobin trending up    DVT prophylaxis: S CD Code Status: Full Family Communication: None Disposition Plan: TBD   Consultants:  Cardiology PCCM    Procedures/Significant Events:  4/8 acute abdominal series: Negative ileus or SBO 4/9 PCXR:  Unchanged layering pleural effusion and pulmonary opacification     I have personally reviewed and interpreted all radiology studies and my findings are as above.  VENTILATOR SETTINGS: Mode:TC FiO2: 35% SPO2: 100%   Cultures   Antimicrobials: Anti-infectives (From admission,  onward)   Start     Ordered Stop   02/19/19 1200  ciprofloxacin (CIPRO) IVPB 400 mg     02/19/19 1119 02/25/19 1321   02/18/19 1230  ciprofloxacin (CIPRO) IVPB 400 mg  Status:  Discontinued     02/18/19 1127 02/19/19 1119   02/14/19 0900  cefTRIAXone (ROCEPHIN) 2 g in sodium chloride 0.9 % 100 mL IVPB     02/14/19 0846 02/16/19 1033   02/06/19 1900  penicillin G potassium 8 Million Units in dextrose 5 % 500 mL continuous infusion  Status:  Discontinued     02/06/19 1900 02/14/19 0846   02/06/19 1100  penicillin G potassium 8 Million Units in dextrose 5 % 500 mL continuous infusion  Status:  Discontinued     02/06/19 1008 02/06/19 1900   02/05/19 2300  penicillin G potassium 4 Million Units in dextrose 5 % 250 mL IVPB  Status:  Discontinued     02/04/19 2216 02/04/19 2237   02/04/19 2300  vancomycin (VANCOCIN) 1,500 mg in sodium chloride 0.9 % 500 mL IVPB     02/04/19 2216 02/05/19 0308   02/04/19 2300  penicillin G potassium 4 Million Units in dextrose 5 % 250 mL IVPB  Status:  Discontinued     02/04/19 2237 02/06/19 1008   02/04/19 2230  clindamycin (CLEOCIN) IVPB 600 mg  Status:  Discontinued     02/04/19 2216 02/05/19 0917   02/04/19 0315  piperacillin-tazobactam (ZOSYN) IVPB 3.375 g     02/04/19 0303 02/04/19 0408   02/04/19 0315  vancomycin (VANCOCIN) IVPB 1000 mg/200 mL premix     02/04/19 0303 02/04/19 0424        Devices    LINES / TUBES:  6 mm cuffed trach 3/31>>    Continuous Infusions: . sodium chloride Stopped (03/04/19 0815)     Objective: Vitals:   03/12/19 1300 03/12/19 1347 03/12/19 1400 03/12/19 1559  BP: (!) 113/50 (!) 113/50 (!) 102/45   Pulse: 83 87 77   Resp: (!) 9 18 15    Temp:    98.4 F (36.9 C)  TempSrc:    Oral  SpO2: 100%  100%   Weight:      Height:        Intake/Output Summary (Last 24 hours) at 03/12/2019 1648 Last data filed at 03/12/2019 1400 Gross per 24 hour  Intake 1200 ml  Output 250 ml  Net 950 ml   Filed Weights    03/09/19 0500 03/10/19 0500 03/11/19 0700  Weight: 75.1 kg 73.7 kg 72.9 kg   General: Alert, talking to family on the monitor, positive acute respiratory distress Eyes: negative scleral hemorrhage, negative anisocoria, negative icterus ENT: Negative Runny nose, negative gingival bleeding, Neck:  Negative scars, masses, torticollis, lymphadenopathy, JVD 6 mm cuffed trach in place negative sign of infection bleeding Lungs: Clear to auscultation bilaterally without wheezes or crackles Cardiovascular: Regular rate and rhythm without murmur gallop or rub normal S1 and S2 Abdomen: negative abdominal pain, nondistended, positive soft, bowel sounds, no rebound, no ascites, no appreciable mass Extremities: No significant cyanosis, clubbing, or edema bilateral lower extremities Skin: Negative rashes, lesions, ulcers Psychiatric:  Negative depression, negative anxiety, negative fatigue, negative mania  Central nervous system:  Cranial  nerves II through XII intact, tongue/uvula midline, all extremities muscle strength 5/5, sensation intact throughout, negative dysarthria, negative expressive aphasia, negative receptive aphasia.      Data Reviewed: Care during the described time interval was provided by me .  I have reviewed this patient's available data, including medical history, events of note, physical examination, and all test results as part of my evaluation.   CBC: Recent Labs  Lab 03/06/19 0318 03/07/19 0434 03/08/19 0352 03/09/19 0418 03/10/19 0448  WBC 10.3 9.0 9.7 8.3 8.8  NEUTROABS 7.4  --   --   --   --   HGB 7.3* 9.2* 9.9* 8.9* 9.1*  HCT 26.5* 31.8* 34.2* 31.2* 31.7*  MCV 88.3 86.6 87.0 88.9 88.8  PLT 211 227 289 276 702   Basic Metabolic Panel: Recent Labs  Lab 03/08/19 0352 03/08/19 1810 03/09/19 0418 03/10/19 0448 03/11/19 0416 03/12/19 0508  NA 139  --  140 141 141 141  K 4.3  --  3.9 3.6 4.0 3.8  CL 102  --  102 101 104 105  CO2 26  --  28 26 27 25   GLUCOSE 92   --  99 130* 218* 239*  BUN 46*  --  43* 35* 41* 43*  CREATININE 1.36*  --  1.42* 1.21* 1.43* 1.35*  CALCIUM 8.8*  --  8.7* 8.8* 8.4* 8.4*  MG 2.3 2.5* 2.4 2.3  --  2.7*  PHOS 5.4* 5.6* 5.7* 4.1 4.7* 3.4   GFR: Estimated Creatinine Clearance: 38.5 mL/min (A) (by C-G formula based on SCr of 1.35 mg/dL (H)). Liver Function Tests: Recent Labs  Lab 03/08/19 0352 03/09/19 0418 03/10/19 0448 03/11/19 0416 03/12/19 0508  ALBUMIN 2.5* 2.4* 2.4* 2.3* 2.4*   No results for input(s): LIPASE, AMYLASE in the last 168 hours. No results for input(s): AMMONIA in the last 168 hours. Coagulation Profile: No results for input(s): INR, PROTIME in the last 168 hours. Cardiac Enzymes: No results for input(s): CKTOTAL, CKMB, CKMBINDEX, TROPONINI in the last 168 hours. BNP (last 3 results) No results for input(s): PROBNP in the last 8760 hours. HbA1C: No results for input(s): HGBA1C in the last 72 hours. CBG: Recent Labs  Lab 03/11/19 2338 03/12/19 0434 03/12/19 0801 03/12/19 1138 03/12/19 1604  GLUCAP 210* 169* 179* 221* 162*   Lipid Profile: No results for input(s): CHOL, HDL, LDLCALC, TRIG, CHOLHDL, LDLDIRECT in the last 72 hours. Thyroid Function Tests: No results for input(s): TSH, T4TOTAL, FREET4, T3FREE, THYROIDAB in the last 72 hours. Anemia Panel: No results for input(s): VITAMINB12, FOLATE, FERRITIN, TIBC, IRON, RETICCTPCT in the last 72 hours. Urine analysis:    Component Value Date/Time   COLORURINE YELLOW 02/04/2019 0123   APPEARANCEUR CLEAR 02/04/2019 0123   LABSPEC 1.015 02/04/2019 0123   PHURINE 5.5 02/04/2019 0123   GLUCOSEU NEGATIVE 02/04/2019 0123   HGBUR NEGATIVE 02/04/2019 0123   BILIRUBINUR NEGATIVE 02/04/2019 0123   KETONESUR NEGATIVE 02/04/2019 0123   PROTEINUR NEGATIVE 02/04/2019 0123   NITRITE NEGATIVE 02/04/2019 0123   LEUKOCYTESUR NEGATIVE 02/04/2019 0123   Sepsis Labs: @LABRCNTIP (procalcitonin:4,lacticidven:4)  ) Recent Results (from the past 240  hour(s))  Culture, respiratory (non-expectorated)     Status: None (Preliminary result)   Collection Time: 03/12/19  9:25 AM  Result Value Ref Range Status   Specimen Description TRACHEAL ASPIRATE  Final   Special Requests NONE  Final   Gram Stain   Final    NO WBC SEEN RARE GRAM POSITIVE COCCI IN PAIRS Performed at [facility], [place] [address] [place], [place] [address]    Culture PENDING  Incomplete   Report Status PENDING  Incomplete         Radiology Studies: Dg Chest Port 1 View  Result Date: 03/11/2019 CLINICAL DATA:  Shortness of breath EXAM: PORTABLE CHEST 1 VIEW COMPARISON:  March 06, 2019 FINDINGS: Tracheostomy catheter tip is 4.4 cm above the carina. Central catheter tip is in the right atrium slightly beyond the superior vena cava-right atrium junction. Feeding tube tip is below the diaphragm with tip not seen. No pneumothorax. There is consolidation in the left base with small left pleural effusion. There is mild right base atelectasis. Heart is mildly enlarged with pulmonary vascularity normal. There is aortic atherosclerosis. No adenopathy. No bone lesions. IMPRESSION: Tube and catheter positions as described without pneumothorax. Consolidation in a portion of the left lower lobe, likely pneumonia, with small left pleural effusion. Mild right base atelectasis. Stable cardiac silhouette.  Aortic Atherosclerosis (ICD10-I70.0). Electronically Signed   By: Lowella Grip III M.D.   On: 03/11/2019 10:19        Scheduled Meds: . sodium chloride   Intravenous Once  . amiodarone  200 mg Per Tube Daily  . aspirin EC  81 mg Oral Daily  . atorvastatin  40 mg Per Tube q1800  . carvedilol  3.125 mg Per NG tube BID WC  . chlorhexidine gluconate (MEDLINE KIT)  15 mL Mouth Rinse BID  . Chlorhexidine Gluconate Cloth  6 each Topical Q0600  . clonazepam  1 mg Per Tube TID  . feeding supplement (GLUCERNA 1.5 CAL)  1,000 mL Per Tube Q24H  . feeding supplement (PRO-STAT SUGAR  FREE 64)  30 mL Per Tube Daily  . hydrALAZINE  10 mg Per Tube Q8H  . insulin aspart  0-24 Units Subcutaneous Q4H  . insulin glargine  10 Units Subcutaneous QHS  . isosorbide dinitrate  10 mg Per Tube TID  . losartan  25 mg Per Tube Daily  . mouth rinse  15 mL Mouth Rinse 10 times per day  . multivitamin  15 mL Per Tube Daily  . pantoprazole (PROTONIX) IV  40 mg Intravenous Q12H  . polyethylene glycol  17 g Oral BID  . sertraline  25 mg Per Tube Daily  . sodium chloride flush  10-40 mL Intracatheter Q12H  . spironolactone  12.5 mg Per Tube Daily  . traZODone  50 mg Per Tube QHS   Continuous Infusions: . sodium chloride Stopped (03/04/19 0815)     LOS: 36 days   The patient is critically ill with multiple organ systems failure and requires high complexity decision making for assessment and support, frequent evaluation and titration of therapies, application of advanced monitoring technologies and extensive interpretation of multiple databases. Critical Care Time devoted to patient care services described in this note  Time spent: 40 minutes     , Geraldo Docker, MD [facility] Pager [phone]  If 7PM-7AM, please contact night-coverage [email or web address] Password [facility] 03/12/2019, 4:48 PM

## 2019-03-12 NOTE — Progress Notes (Signed)
RT placed patient on 35% ATC. No complications. Patient is tolerating well at this time. RN at bedside. RT will continue to monitor.

## 2019-03-12 NOTE — Progress Notes (Signed)
Inpatient Diabetes Program Recommendations  AACE/ADA: New Consensus Statement on Inpatient Glycemic Control (2015)  Target Ranges:  Prepandial:   less than 140 mg/dL      Peak postprandial:   less than 180 mg/dL (1-2 hours)      Critically ill patients:  140 - 180 mg/dL   Results for Gabrielle, Wright (MRN [id number]) as of 03/12/2019 08:46  Ref. Range 03/10/2019 23:36 03/11/2019 03:43 03/11/2019 07:45 03/11/2019 11:13 03/11/2019 15:51 03/11/2019 19:55  Glucose-Capillary Latest Ref Range: 70 - 99 mg/dL 427 (H)  4 units NOVOLOG 170 (H)  4 units NOVOLOG 189 (H)  4 units NOVOLOG 230 (H)  8 units NOVOLOG 151 (H)  2 units NOVOLOG 206 (H)  8 units NOVOLOG    Results for Gabrielle, Wright (MRN [id number]) as of 03/12/2019 08:46  Ref. Range 03/11/2019 23:38 03/12/2019 04:34 03/12/2019 08:01  Glucose-Capillary Latest Ref Range: 70 - 99 mg/dL 151 (H)  8 units NOVOLOG 169 (H)  4 units NOVOLOG 179 (H)    Home DM Meds: Humulin 70/30 Insulin: 28-30 units BID       Regular Insulin 10 units with Lunch       Tradjenta 2.5 mg Daily       Metformin 500 mg Daily  Current Orders: Novolog 0-24 units Q4 hours    Getting Glucerna Tube feeds at 40cc/hr.  Having some CBGs >200 mg/dl.    MD- Please consider adding Novolog Tube Feed Coverage:  Novolog 3 units Q4 hours  HOLD if Tube feeds HELD for any reason     --Will follow patient during hospitalization--  Ambrose Finland RN, MSN, CDE Diabetes Coordinator Inpatient Glycemic Control Team Team Pager: [phone] (8a-5p)

## 2019-03-12 NOTE — Progress Notes (Signed)
NAME:  Gabrielle Wright, MRN:  [id number], DOB:  06/13/1946, LOS: 36 ADMISSION DATE:  02/03/2019, CONSULTATION DATE: 02/04/2019 REFERRING MD: Emergency department physician CHIEF COMPLAINT: Fever respiratory failure  Brief History   73 year old elderly woman visiting from [place], admitted 3/10 with fevers, hypotension, found to have group A strep bacteremia, required mechanical ventilation Course complicated by non-STEMI and new LV dysfunction ? Ischemic vs Takatsubo's Failed extubation 3/18 due to acute pulmonary edema. S/p trach 3/30.  Past Medical History  Hypertension Coronary artery disease Suspected diabetes.  Significant Hospital Is Events   02/04/2019 transfer from [facility] to [facility] required intubation and pressors. 3/16 off levophed 3/18 extubated briefly but reintubated due to pulmonary edema 3/19 started milrinone 3/20 Episode of respiratory distress,  Precedex changed to propofol Atrial fibrillation RVR >>amiodarone bolus >> sinus rhythm 3/21 rising creat , AF-RVR >> amio bolus 3/24:rising creatinine 3/30: reintubated 3/31 trach 4/1 PICC  Consults:  02/04/2019 ID  IR  Cardiology  Procedures:  ETT 3/10 > 3/18, 3/18 > 3/31 Trach 3/31 > CVL 3/10 > 4/3 PICC 4/1 >   Significant Diagnostic Tests:  Echo 3/15 decreased EF 35 to 40%, LVEDP 27, no significant MR Cardiac cath >> LVEDP 40, RCA totally occluded, patent OM1 stent but distally occluded, apical LAD diffusely diseased, no intervention Chest x-ray 3/22  bilateral lower lobe airspace disease versus layering effusions CXR 3/23> pulmonary edema and bilateral pleural effusions. Echo 3/26 limited> EF 35-40%, elevated LA and LV end-diastolic pressures, Diffuse hypokinesis of LV CXR 4/1> bibasilar opacities, small R pleural effusion, tracheostomy tube present and appropriately positioned  CXR 4/9> bilateral infiltrates with R pleural effusion CXR 4/14> possible left sided infiltrate with a small  effusion, right sided atelectasis  Micro Data:  02/04/2019 blood cultures x2>> Gr A strep 02/04/2019 sputum culture>> neg 02/04/2019 urine culture>>neg 02/04/2019 respiratory virus panel>> rhinovirus 02/04/2019 flu a and B>> neg coronavirus testing 02/04/2019>>neg resp 3/21 >> Klebsiella oxytoca, co-ag negative staph  Antimicrobials:  02/04/2019 vancomycin>>3/ 10 02/04/2019 Zosyn>> 3/10 Pen G 3/10 >> 3/22 (plan) Ceftx 3/20 >>3/24 Cipro 3/24>> 3/31  Interim history/subjective:  No acute events overnight; tolerated 2hrs of trach collar yesterday. Per RN having significant secretions.  Objective   Blood pressure (!) 115/50, pulse 79, temperature 98.7 F (37.1 C), temperature source Oral, resp. rate 15, height 5\' 6"  (1.676 m), weight 72.9 kg, SpO2 100 %. CVP:  [5 mmHg-9 mmHg] 5 mmHg  Vent Mode: PRVC FiO2 (%):  [30 %-35 %] 30 % Set Rate:  [12 bmp] 12 bmp Vt Set:  [450 mL] 450 mL PEEP:  [5 cmH20] 5 cmH20 Plateau Pressure:  [16 cmH20-18 cmH20] 18 cmH20   Intake/Output Summary (Last 24 hours) at 03/12/2019 0849 Last data filed at 03/12/2019 0800 Gross per 24 hour  Intake 1020 ml  Output -  Net 1020 ml   Filed Weights   03/09/19 0500 03/10/19 0500 03/11/19 0700  Weight: 75.1 kg 73.7 kg 72.9 kg   Examination:  General: adult female on vent Neuro: alert, awake, follows commands HEENT: [place]/AT. No JVD. Trach in place.  Cardiovascular: RRR, no MRG.  Lungs: diffuse rhonchi, no increased work of breathing. Abdomen: soft, distended, non tender  Musculoskeletal: No gross deformities, no edema.  Skin: Grossly intact.   Assessment & Plan:   Acute respiratory failure requiring mechanical ventilation - s/p trach 3/31. Weaned on trach collar for several hours yesterday. Will try to do trach collar 2hrs at a time about twice daily. Appears euvolemic,  having more rhonchi and secretions. -trach collar again today to 3 hours twice a day, and increase progressively -guaifenesin prn -CXR yesterday  revealed possible left sided pna - f/u respiratory culture -CXR on 4/17  Ischemic Cardiomyopathy (Echo 3/26 EF 35-40%, LV diffuse hypokinesis) - no targets for revascularization on cath. Due to borderline BPs, she has had to have coreg, hydral, isosorbide held for at least one dose - Continue coreg, hydralazine, imdur, spironolactone, and losartan, asa; started on losartan and spironolactone per cards; consider narrowing therapy based on BP.  - Holding plavix 2/2 Hgb drop and ?bloody NG output last week; since then Hgb stable and no signs of bleeding  Anemia: Report of mild bloody NG output and Hgb drop last week from 8.1>7.3; s/p 1u pRBC per primary. Stable Hgb since then and no signs of bleeding. ASA restarted but still holding plavix, per primary and cards.   Atrial Fibrillation with RVR - resolved. - Continue amio, coreg. No anticoagulation per cards as thought to be 2/2 acute illness and sepsis.   Diabetes type 2/uncontrolled hyperglycemia. - SSI resistant.  Intermittent agitation - exacerbated by language barrier (family video conference has seemed to help).   - Continue clonazepam - Seroquel QHS - PT to work with patient.  - Establish good sleep wake cycle.   Dysphagia:  - Continue TF Cortrak. May need to consider PEG over the next few days if not able to tolerate trach collar consistently.   CM on board for LTAC options.   GI ppx: Protonix VAP: per protocol Pain/Sedation: PRN clonazepam DVT ppx: SCDs  Diet: Enteral Nutrition Code Status: full   Nyra Market, MD PGY3 Pager [phone]  03/12/2019 8:49 AM

## 2019-03-13 DIAGNOSIS — K567 Ileus, unspecified: Secondary | ICD-10-CM

## 2019-03-13 DIAGNOSIS — Z9289 Personal history of other medical treatment: Secondary | ICD-10-CM

## 2019-03-13 LAB — GLUCOSE, CAPILLARY
Glucose-Capillary: 230 mg/dL — ABNORMAL HIGH (ref 70–99)
Glucose-Capillary: 202 mg/dL — ABNORMAL HIGH (ref 70–99)
Glucose-Capillary: 216 mg/dL — ABNORMAL HIGH (ref 70–99)
Glucose-Capillary: 176 mg/dL — ABNORMAL HIGH (ref 70–99)
Glucose-Capillary: 240 mg/dL — ABNORMAL HIGH (ref 70–99)

## 2019-03-13 LAB — CBC
HCT: 30 % — ABNORMAL LOW (ref 36.0–46.0)
Hemoglobin: 8.4 g/dL — ABNORMAL LOW (ref 12.0–15.0)
MCH: 25.5 pg — ABNORMAL LOW (ref 26.0–34.0)
MCHC: 28 g/dL — ABNORMAL LOW (ref 30.0–36.0)
MCV: 91.2 fL (ref 80.0–100.0)
Platelets: 282 10*3/uL (ref 150–400)
RBC: 3.29 MIL/uL — ABNORMAL LOW (ref 3.87–5.11)
RDW: 17.8 % — ABNORMAL HIGH (ref 11.5–15.5)
WBC: 10.1 10*3/uL (ref 4.0–10.5)
nRBC: 0 % (ref 0.0–0.2)

## 2019-03-13 MED ORDER — SODIUM CHLORIDE 0.9 % IV BOLUS
500.0000 mL | Freq: Once | INTRAVENOUS | Status: AC
  Administered 2019-03-13: 19:00:00 500 mL via INTRAVENOUS

## 2019-03-13 MED ORDER — INSULIN GLARGINE 100 UNIT/ML ~~LOC~~ SOLN
20.0000 [IU] | Freq: Every day | SUBCUTANEOUS | Status: DC
  Administered 2019-03-13: 20 [IU] via SUBCUTANEOUS
  Filled 2019-03-13 (×2): qty 0.2

## 2019-03-13 NOTE — Progress Notes (Signed)
Nutrition Follow-up  DOCUMENTATION CODES:   Not applicable  INTERVENTION:   Continue tube feeding: - Glucerna 1.5 @ 40 ml/hr (960 ml/day) - Continue Pro-stat 30 ml daily  Tube feeding regimen provides 1540 kcal, 94 grams of protein, and 729 ml of H2O (106% of kcal needs, 100% of protein needs).  NUTRITION DIAGNOSIS:   Inadequate oral intake related to inability to eat as evidenced by NPO status.  Ongoing  GOAL:   Patient will meet greater than or equal to 90% of their needs  Met with TF  MONITOR:   Vent status, TF tolerance, Labs, I & O's  REASON FOR ASSESSMENT:   Consult Enteral/tube feeding initiation and management  ASSESSMENT:   73 yo female with PMH of DM-2, HTN, CAD from [place] who was admitted with fever and respiratory failure requiring intubation. Admission complicated by NSTEMI and new LV dysfunction.   3/18- failed extubation 3/30- extubated  3/31- DNR rescinded, re-intubated, trach  4/3- post pyloric Cortrak placed 4/8- pt vomiting, NGT placed to suction, TF stopped 4/13- abdominal pain, changed to continuous feedings  RD working remotely.  Pt tolerated 2 separte trach collar trials yesterday, significant secretions continue. Spoke with RN via phone. Pt tolerating TF well. No reports of abdominal pain. Pt wishes to have water by mouth and complains of soreness around her nose from Cortrak. RN monitoring site. Will continue with current TF regimen and increase once pt is fully weaned from vent.   Pt having liquid stools. Miralax not given this am. Pt receiving fiber via TF. Will continue to monitor closely.   Weight noted to remain stable at 73.6 kg since last RD visit.   Patient currently requiring ventilator support at night MV: 10.1 L/min Temp (24hrs), Avg:98.2 F (36.8 C), Min:96.6 F (35.9 C), Max:99.3 F (37.4 C)  I/O: +4,031 since 4/2 UOP: 850 ml x 24 hrs   Drips: NS 10-20 ml/hr Medications: amiodarone, SS novolog, lantus,  liquid MVI, miralax, aldactone Labs: Mg 2.7 (H) CBG 151-239  Diet Order:   Diet Order            Diet NPO time specified  Diet effective now              EDUCATION NEEDS:   No education needs have been identified at this time  Skin:  Skin Assessment: Reviewed RN Assessment  Last BM:  4/16- liquid  Height:   Ht Readings from Last 1 Encounters:  03/08/19 5' 6"  (1.676 m)    Weight:   Wt Readings from Last 1 Encounters:  03/13/19 73.6 kg    Ideal Body Weight:  59.1 kg  BMI:  Body mass index is 26.19 kg/m.  Estimated Nutritional Needs:   Kcal:  6962  Protein:  90-105 grams  Fluid:  >/= 1.4   Mariana Single RD, LDN Clinical Nutrition Pager # - [phone]

## 2019-03-13 NOTE — Progress Notes (Signed)
Occupational Therapy Treatment Patient Details Name: Gabrielle Wright MRN: [id number] DOB: 05/07/1946 Today's Date: 03/13/2019    History of present illness 73 y.o. female admitted on 02/03/19 for fever and cough (to [facility]) and was transferred to [facility] on 02/04/19 for hypotension and respiratory distress (acute respiratory failure/pulmonary edema) requiring intubation 3/10-current (time of PT eval 02/27/2019).  She was visiting family from [place] and COVID 19 tests were negative.  She was found to have group A strep bacteremia (septic shiock), rhinovirus,  and course complicated by NSTEMI and new LV dysfunction s/p cardiac cath on 02/09/19.  Cardiology following.  Other dx include ischemic cardiomyopathy, hyperkalemia, DM2- uncontrolled, and abdominal distention.  Pt with significant PMH of DM, HTN, anc CAD. Trach placed on 3/31   OT comments  Pt progressing towards OT goals this session. Able to complete transfer from bed>BSC with EVA walker and then multiple attempts at sit<>stand again - but patient fatiguing too much, required max A +2 face to face transfer from [facility] recliner. Pt so fatigued that she required max A to perform face washing. Pt continues to benefit from skilled OT in the acute setting and SNF level therapy post-acute.   Follow Up Recommendations  Supervision/Assistance - 24 hour;SNF    Equipment Recommendations  Other (comment)(defer to next venue)    Recommendations for Other Services      Precautions / Restrictions Precautions Precautions: Fall Precaution Comments: trach, vent Restrictions Weight Bearing Restrictions: No       Mobility Bed Mobility Overal bed mobility: Needs Assistance Bed Mobility: Supine to Sit     Supine to sit: Mod assist;Total assist;HOB elevated     General bed mobility comments: Pt initiates activity, requires assist for trunk elevation and use of pad to bring hips EOB  Transfers Overall transfer level: Needs  assistance Equipment used: 2 person hand held assist;4-wheeled walker(EVA walker) Transfers: Sit to/from [employer] Sit to Stand: Mod assist;+2 physical assistance;From elevated surface;+2 safety/equipment Stand pivot transfers: Max assist;+2 physical assistance;+2 safety/equipment       General transfer comment: assist for boost, vc for hand placement, phyical cues during transfer    Balance Overall balance assessment: Needs assistance Sitting-balance support: Bilateral upper extremity supported Sitting balance-Leahy Scale: Poor Sitting balance - Comments: in chair, pt needs arm rests or external assist Postural control: Posterior lean Standing balance support: Bilateral upper extremity supported;During functional activity Standing balance-Leahy Scale: Poor Standing balance comment: relaint on external support or AD                           ADL either performed or assessed with clinical judgement   ADL Overall ADL's : Needs assistance/impaired Eating/Feeding: NPO   Grooming: Wash/dry face;Maximal assistance;Sitting Grooming Details (indicate cue type and reason): fatigued at end of session                 Toilet Transfer: Maximal assistance;+2 for physical assistance;+2 for safety/equipment;Stand-pivot;BSC;RW Toilet Transfer Details (indicate cue type and reason): bed > BSC with EVA walker, BSC> recliner face to face due to fatigue Toileting- Clothing Manipulation and Hygiene: Maximal assistance;+2 for physical assistance;+2 for safety/equipment;Sit to/from stand Toileting - Clothing Manipulation Details (indicate cue type and reason): 3rd person helpful for rear perianal care     Functional mobility during ADLs: Maximal assistance;+2 for physical assistance;+2 for safety/equipment;Cueing for safety;Cueing for sequencing;Rolling walker General ADL Comments: fatigues very quickly     Vision  Perception     Praxis      Cognition  Arousal/Alertness: Awake/alert Behavior During Therapy: WFL for tasks assessed/performed Overall Cognitive Status: Difficult to assess                                 General Comments: pt follows interpretors comments and follows simple commands consistently.with time; she is able to write and the interpreter can read it. She also makes her wishes known. no awareness that she was incontinent        Exercises     Shoulder Instructions       General Comments Interpreter Raffat [phone] used    Pertinent Vitals/ Pain       Pain Assessment: Faces Faces Pain Scale: Hurts little more Pain Location: head Pain Descriptors / Indicators: Aching Pain Intervention(s): Limited activity within patient's tolerance;Monitored during session;Repositioned;Other (comment)(head massage)  Home Living                                          Prior Functioning/Environment              Frequency  Min 3X/week        Progress Toward Goals  OT Goals(current goals can now be found in the care plan section)  Progress towards OT goals: Progressing toward goals  Acute Rehab OT Goals Patient Stated Goal: agreeable to working with therapies OT Goal Formulation: With family Time For Goal Achievement: 03/18/19 Potential to Achieve Goals: Fair  Plan Discharge plan remains appropriate    Co-evaluation    PT/OT/SLP Co-Evaluation/Treatment: Yes Reason for Co-Treatment: Complexity of the patient's impairments (multi-system involvement);For patient/therapist safety;To address functional/ADL transfers PT goals addressed during session: Mobility/safety with mobility;Balance;Proper use of DME OT goals addressed during session: ADL's and self-care;Proper use of Adaptive equipment and DME      AM-PAC OT "6 Clicks" Daily Activity     Outcome Measure   Help from another person eating meals?: Total(NPO) Help from another person taking care of personal grooming?: A  Lot Help from another person toileting, which includes using toliet, bedpan, or urinal?: A Lot Help from another person bathing (including washing, rinsing, drying)?: A Lot Help from another person to put on and taking off regular upper body clothing?: Total Help from another person to put on and taking off regular lower body clothing?: Total 6 Click Score: 9    End of Session Equipment Utilized During Treatment: Gait belt;Oxygen(trach collar at 35%)  OT Visit Diagnosis: Muscle weakness (generalized) (M62.81);Other symptoms and signs involving cognitive function   Activity Tolerance Patient limited by fatigue   Patient Left in chair;with call bell/phone within reach;with chair alarm set   Nurse Communication Mobility status        Time: 5003-7048 OT Time Calculation (min): 48 min  Charges: OT General Charges $OT Visit: 1 Visit OT Treatments $Self Care/Home Management : 8-22 mins $Therapeutic Activity: 8-22 mins  Sherryl Manges OTR/L Acute Rehabilitation Services Pager: [phone] Office: [phone]   Evern Bio Rahman Ferrall 03/13/2019, 1:06 PM

## 2019-03-13 NOTE — Progress Notes (Addendum)
PROGRESS NOTE    Gabrielle Wright  KXF:[id number] DOB: 05/09/1946 DOA: 02/03/2019 PCP: Rudene Anda, MD   Brief Narrative:  73 year old female PMHx from [place]. HTN , CAD, DM Type 2?  Noted to have a high fever 103.9. Reported to have cough. Taken to [place] [facility] per family. Was supported to have a urinary tract infection although her urine was clean. She never filled her prescription for antibiotics. Due to persistent hypotension and refractory nature of her hypertension she is transferred to [facility] 02/04/2019. She was in respiratory distress for abdominal chest wall paradoxus. She required intubation and central line placement. She had maxed out on peripheral IV vasopressors therefore central line was placed. Pulmonary critical care will be responsible for care at this time. She was placed in isolation since she came from a 22 entry of [place] with recent reports of coronavirus and that country. Note [place] is not on the list of infected countries. Infectious disease was called and they reported again diagnosis done on 1 reported countries.     Subjective: 4/16 (through interpreter) A/O x4.  Negative CP, negative S OB, negative abdominal pain.  RN states that overnight patient was slightly agitated but only required 1 dose of her PRN Ativan.   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   Cooperating with physical therapy -Secondary to ischemic cardiomyopathy, pulmonary edema. - Patient tolerating weaning today - See ischemic cardiomyopathy   Ischemic cardiomyopathy-acute on chronic systolic and diastolic CHF - 6/96 echocardiogram EF 35 to 40%, LV diffuse hypokinesis, no targets for revascularization on catheterization - Cardiology following  -4/16 CVP? -Strict in and out +3.8 L -Daily weight Filed Weights   03/10/19 0500 03/11/19 0700 03/13/19 0441  Weight: 73.7 kg 72.9 kg 73.6 kg  -Amiodarone 200 mg daily -Coreg 3.125 mg twice daily -Plavix 75 mg daily (hold) -Hydralazine 10 mg 3 times daily -Isosorbide dinitrate 10 mg 3 times daily -4/9 transfuse 1 unit PRBC -Transfuse for hemoglobin<8 per AHA guidelines.  Unfortunately unless patient in distress may not be able to transfuse until hemoglobin<7 secondary to blood shortage.:  A. fib with RVR - See ischemic cardiomyopathy  Hypernatremia - Resolved  Diabetes type 2 uncontrolled with complication - 7/89 hemoglobin A1c= 10.1 - 4/16 increase Lantus 20 units daily - Custom SSI   Agitation/Anxiety -Exacerbated by language barrier.  National language? - Clonazepam 1 mg 3 times daily - DC Seroquel -During the day all shades and lights on  Insomnia  -Trazodone 50 mg daily  Dysphasia - Feedings per core track on hold secondary to abdominal pain. - 4/16 consult for swallow evaluation   GI bleed Recent Labs  Lab 03/08/19 0352 03/09/19 0418 03/10/19 0448 03/12/19 1714 03/13/19 0425  HGB 9.9* 8.9* 9.1* 9.0* 8.4*  -Resolved patient's hemoglobin is stable  DVT prophylaxis: S CD Code Status: Full Family Communication: None Disposition Plan: TBD   Consultants:  Cardiology PCCM  Procedures/Significant Events:  4/8 acute abdominal series: Negative ileus or SBO 4/9 PCXR: Unchanged layering pleural effusion and pulmonary opacification     I have personally reviewed and interpreted all radiology  studies and my findings are as above.  VENTILATOR SETTINGS: Mode: PRVC (at night) Vt set: 450 Set rate: 12 FiO2 30% PEEP: 5  (Daytime) TC FiO2: 35% SPO2: 100%   Cultures   Antimicrobials: Anti-infectives (From admission, onward)   Start     Ordered Stop   02/19/19 1200  ciprofloxacin (CIPRO) IVPB 400 mg     02/19/19 1119 02/25/19 1321   02/18/19 1230  ciprofloxacin (CIPRO) IVPB 400 mg  Status:  Discontinued     02/18/19 1127 02/19/19 1119   02/14/19 0900  cefTRIAXone (ROCEPHIN) 2 g in sodium chloride 0.9 % 100 mL IVPB     02/14/19 0846 02/16/19 1033   02/06/19 1900  penicillin G potassium 8 Million Units in dextrose 5 % 500 mL continuous infusion  Status:  Discontinued     02/06/19 1900 02/14/19 0846   02/06/19 1100  penicillin G potassium 8 Million Units in dextrose 5 % 500 mL continuous infusion  Status:  Discontinued     02/06/19 1008 02/06/19 1900   02/05/19 2300  penicillin G potassium 4 Million Units in dextrose 5 % 250 mL IVPB  Status:  Discontinued     02/04/19 2216 02/04/19 2237   02/04/19 2300  vancomycin (VANCOCIN) 1,500 mg in sodium chloride 0.9 % 500 mL IVPB     02/04/19 2216 02/05/19 0308   02/04/19 2300  penicillin G potassium 4 Million Units in dextrose 5 % 250 mL IVPB  Status:  Discontinued     02/04/19 2237 02/06/19 1008   02/04/19 2230  clindamycin (CLEOCIN) IVPB 600 mg  Status:  Discontinued     02/04/19 2216 02/05/19 0917   02/04/19 0315  piperacillin-tazobactam (ZOSYN) IVPB 3.375 g     02/04/19 0303 02/04/19 0408   02/04/19 0315  vancomycin (VANCOCIN) IVPB 1000 mg/200 mL premix     02/04/19 0303 02/04/19 0424        Devices    LINES / TUBES:  6 mm cuffed trach 3/31>>    Continuous Infusions: . sodium chloride Stopped (03/04/19 0815)     Objective: Vitals:   03/13/19 0441 03/13/19 0500 03/13/19 0540 03/13/19 0700  BP:  (!) 113/51 (!) 113/51 (!) 111/54  Pulse:  80  78  Resp:  14  14  Temp:      TempSrc:      SpO2:  100%  100%   Weight: 73.6 kg     Height:        Intake/Output Summary (Last 24 hours) at 03/13/2019 0743 Last data filed at 03/13/2019 0543 Gross per 24 hour  Intake 1440 ml  Output 850 ml  Net 590 ml   Filed Weights   03/10/19 0500 03/11/19 0700 03/13/19 0441  Weight: 73.7 kg 72.9 kg 73.6 kg   General: A/O x4, positive acute respiratory distress Eyes: negative scleral hemorrhage, negative anisocoria, negative icterus ENT: Negative Runny nose, negative gingival bleeding, Neck:  Negative scars, masses, torticollis, lymphadenopathy, JVD, #6 cuffed trach negative sign of infection or bleeding Lungs: Clear to auscultation bilaterally without wheezes or crackles Cardiovascular: Regular rate and rhythm without murmur gallop or rub normal S1 and S2 Abdomen: negative abdominal pain, nondistended, positive soft, bowel sounds, no rebound, no ascites, no appreciable mass Extremities: No significant cyanosis, clubbing, or edema bilateral lower extremities Skin: Negative  rashes, lesions, ulcers Psychiatric:  Negative depression, negative anxiety, negative fatigue, negative mania  Central nervous system:  Cranial nerves II through XII intact, tongue/uvula midline, all extremities muscle strength 5/5, sensation intact throughout,  negative dysarthria, negative expressive aphasia, negative receptive aphasia.       Data Reviewed: Care during the described time interval was provided by me .  I have reviewed this patient's available data, including medical history, events of note, physical examination, and all test results as part of my evaluation.   CBC: Recent Labs  Lab 03/08/19 0352 03/09/19 0418 03/10/19 0448 03/12/19 1714 03/13/19 0425  WBC 9.7 8.3 8.8 10.4 10.1  HGB 9.9* 8.9* 9.1* 9.0* 8.4*  HCT 34.2* 31.2* 31.7* 31.7* 30.0*  MCV 87.0 88.9 88.8 88.8 91.2  PLT 289 276 305 306 681   Basic Metabolic Panel: Recent Labs  Lab 03/08/19 0352 03/08/19 1810 03/09/19 0418 03/10/19 0448 03/11/19 0416  03/12/19 0508  NA 139  --  140 141 141 141  K 4.3  --  3.9 3.6 4.0 3.8  CL 102  --  102 101 104 105  CO2 26  --  _0 GLUCOSE 92  --  99 130* 218* 239*  BUN 46*  --  43* 35* 41* 43*  CREATININE 1.36*  --  1.42* 1.21* 1.43* 1.35*  CALCIUM 8.8*  --  8.7* 8.8* 8.4* 8.4*  MG 2.3 2.5* 2.4 2.3  --  2.7*  PHOS 5.4* 5.6* 5.7* 4.1 4.7* 3.4   GFR: Estimated Creatinine Clearance: 38.7 mL/min (A) (by C-G formula based on SCr of 1.35 mg/dL (H)). Liver Function Tests: Recent Labs  Lab 03/08/19 0352 03/09/19 0418 03/10/19 0448 03/11/19 0416 03/12/19 0508  ALBUMIN 2.5* 2.4* 2.4* 2.3* 2.4*   No results for input(s): LIPASE, AMYLASE in the last 168 hours. No results for input(s): AMMONIA in the last 168 hours. Coagulation Profile: No results for input(s): INR, PROTIME in the last 168 hours. Cardiac Enzymes: No results for input(s): CKTOTAL, CKMB, CKMBINDEX, TROPONINI in the last 168 hours. BNP (last 3 results) No results for input(s): PROBNP in the last 8760 hours. HbA1C: No results for input(s): HGBA1C in the last 72 hours. CBG: Recent Labs  Lab 03/12/19 0801 03/12/19 1138 03/12/19 1604 03/12/19 2013 03/13/19 0428  GLUCAP 179* 221* 162* 244* 230*   Lipid Profile: No results for input(s): CHOL, HDL, LDLCALC, TRIG, CHOLHDL, LDLDIRECT in the last 72 hours. Thyroid Function Tests: No results for input(s): TSH, T4TOTAL, FREET4, T3FREE, THYROIDAB in the last 72 hours. Anemia Panel: No results for input(s): VITAMINB12, FOLATE, FERRITIN, TIBC, IRON, RETICCTPCT in the last 72 hours. Urine analysis:    Component Value Date/Time   COLORURINE YELLOW 02/04/2019 0123   APPEARANCEUR CLEAR 02/04/2019 0123   LABSPEC 1.015 02/04/2019 0123   PHURINE 5.5 02/04/2019 0123   GLUCOSEU NEGATIVE 02/04/2019 0123   HGBUR NEGATIVE 02/04/2019 0123   BILIRUBINUR NEGATIVE 02/04/2019 0123   KETONESUR NEGATIVE 02/04/2019 0123   PROTEINUR NEGATIVE 02/04/2019 0123   NITRITE NEGATIVE 02/04/2019 0123    LEUKOCYTESUR NEGATIVE 02/04/2019 0123   Sepsis Labs: _1 (procalcitonin:4,lacticidven:4)  ) Recent Results (from the past 240 hour(s))  Culture, respiratory (non-expectorated)     Status: None (Preliminary result)   Collection Time: 03/12/19  9:25 AM  Result Value Ref Range Status   Specimen Description TRACHEAL ASPIRATE  Final   Special Requests NONE  Final   Gram Stain   Final    NO WBC SEEN RARE GRAM POSITIVE COCCI  IN PAIRS Performed at [facility], [place] [address], [place], [place] [address]    Culture PENDING  Incomplete   Report Status PENDING  Incomplete         Radiology Studies: Dg Chest Port 1 View  Result Date: 03/11/2019 CLINICAL DATA:  Shortness of breath EXAM: PORTABLE CHEST 1 VIEW COMPARISON:  March 06, 2019 FINDINGS: Tracheostomy catheter tip is 4.4 cm above the carina. Central catheter tip is in the right atrium slightly beyond the superior vena cava-right atrium junction. Feeding tube tip is below the diaphragm with tip not seen. No pneumothorax. There is consolidation in the left base with small left pleural effusion. There is mild right base atelectasis. Heart is mildly enlarged with pulmonary vascularity normal. There is aortic atherosclerosis. No adenopathy. No bone lesions. IMPRESSION: Tube and catheter positions as described without pneumothorax. Consolidation in a portion of the left lower lobe, likely pneumonia, with small left pleural effusion. Mild right base atelectasis. Stable cardiac silhouette.  Aortic Atherosclerosis (ICD10-I70.0). Electronically Signed   By: Lowella Grip III M.D.   On: 03/11/2019 10:19        Scheduled Meds: . sodium chloride   Intravenous Once  . amiodarone  200 mg Per Tube Daily  . aspirin EC  81 mg Oral Daily  . atorvastatin  40 mg Per Tube q1800  . carvedilol  3.125 mg Per NG tube BID WC  . chlorhexidine gluconate (MEDLINE KIT)  15 mL Mouth Rinse BID  . Chlorhexidine Gluconate Cloth  6 each Topical  Q0600  . clonazepam  1 mg Per Tube TID  . feeding supplement (GLUCERNA 1.5 CAL)  1,000 mL Per Tube Q24H  . feeding supplement (PRO-STAT SUGAR FREE 64)  30 mL Per Tube Daily  . hydrALAZINE  10 mg Per Tube Q8H  . insulin aspart  0-24 Units Subcutaneous Q4H  . insulin glargine  15 Units Subcutaneous QHS  . isosorbide dinitrate  10 mg Per Tube TID  . losartan  25 mg Per Tube Daily  . mouth rinse  15 mL Mouth Rinse 10 times per day  . multivitamin  15 mL Per Tube Daily  . pantoprazole (PROTONIX) IV  40 mg Intravenous Q12H  . polyethylene glycol  17 g Oral BID  . sertraline  25 mg Per Tube Daily  . sodium chloride flush  10-40 mL Intracatheter Q12H  . spironolactone  12.5 mg Per Tube Daily  . traZODone  50 mg Per Tube QHS   Continuous Infusions: . sodium chloride Stopped (03/04/19 0815)     LOS: 37 days   The patient is critically ill with multiple organ systems failure and requires high complexity decision making for assessment and support, frequent evaluation and titration of therapies, application of advanced monitoring technologies and extensive interpretation of multiple databases. Critical Care Time devoted to patient care services described in this note  Time spent: 40 minutes     Rolande Moe, Geraldo Docker, MD [facility] Pager [phone]  If 7PM-7AM, please contact night-coverage [email or web address] Password [facility] 03/13/2019, 7:43 AM

## 2019-03-13 NOTE — Progress Notes (Signed)
Physical Therapy Treatment Patient Details Name: Gabrielle Wright MRN: [id number] DOB: [birth year] Today's Date: 03/13/2019    History of Present Illness 73 y.o. female admitted on 02/03/19 for fever and cough (to [facility]) and was transferred to [facility] on 02/04/19 for hypotension and respiratory distress (acute respiratory failure/pulmonary edema) requiring intubation 3/10-current (time of PT eval 02/27/2019).  She was visiting family from [place] and COVID 19 tests were negative.  She was found to have group A strep bacteremia (septic shiock), rhinovirus,  and course complicated by NSTEMI and new LV dysfunction s/p cardiac cath on 02/09/19.  Cardiology following.  Other dx include ischemic cardiomyopathy, hyperkalemia, DM2- uncontrolled, and abdominal distention.  Pt with significant PMH of DM, HTN, anc CAD. Trach placed on 3/31    PT Comments    Pt initially resistant to mobility due to her trach and NG tube, but pt agreeable with mod verbal encouragement. Pt transferred from bed to [facility], and BSC to recliner this session, limited by fatigue and dizziness. VS listed below. Pt required max assist +2 for transfers this session, unable to attempt ambulation this session due to fatigue. Pt transferred both with RW and HHA +2, performed better with HHA +2. Pt may benefit from RW use as opposed to EVA walker next session. PT to continue to follow acutely, recommendation for d/c remains appropriate.  BP, rest: 117/52 BP after initial transfer:104/44 BP after second transfer to recliner: 101/42  HR: WFL O2sats: >=96% on 35% trach collar   Follow Up Recommendations  SNF     Equipment Recommendations  Other (comment)    Recommendations for Other Services       Precautions / Restrictions Precautions Precautions: Fall Precaution Comments: trach, vent Restrictions Weight Bearing Restrictions: No    Mobility  Bed Mobility Overal bed mobility: Needs Assistance Bed Mobility:  Rolling;Sidelying to Sit Rolling: Min assist Sidelying to sit: Mod assist;HOB elevated Supine to sit: Mod assist;Total assist;HOB elevated     General bed mobility comments: Min assist for rolling to R for trunk and LE movement. Mod assist for sidelying to sit for trunk elevation, scooting to EOB with use of bed pad.   Transfers Overall transfer level: Needs assistance Equipment used: 2 person hand held assist;4-wheeled walker(EVA walker) Transfers: Sit to/from [employer] Transfers Sit to Stand: Mod assist;+2 physical assistance;From elevated surface;+2 safety/equipment Stand pivot transfers: Max assist;+2 physical assistance;+2 safety/equipment       General transfer comment: Max assist +2 for power up, hip extension to come to full standing, and steadying upon standing. Verbal cuing for hand placement when rising.  Ambulation/Gait Ambulation/Gait assistance: (NT - pt very fatigued with transfer to [facility] from bed)               Stairs             Wheelchair Mobility    Modified Rankin (Stroke Patients Only)       Balance Overall balance assessment: Needs assistance Sitting-balance support: Bilateral upper extremity supported Sitting balance-Leahy Scale: Poor Sitting balance - Comments: in chair, pt needs arm rests or external assist Postural control: Posterior lean Standing balance support: Bilateral upper extremity supported;During functional activity Standing balance-Leahy Scale: Poor Standing balance comment: reliant of HHA or EVA walker during session                            Cognition Arousal/Alertness: Awake/alert Behavior During Therapy: WFL for tasks assessed/performed Overall Cognitive Status:  Difficult to assess                                 General Comments: pt follows interpretors comments and follows simple commands consistently.with time; she is able to write and the interpreter can read it. She also makes  her wishes known. no awareness that she was incontinent      Exercises      General Comments General comments (skin integrity, edema, etc.): Interpreter Raffat [phone] used      Pertinent Vitals/Pain Pain Assessment: Faces Faces Pain Scale: Hurts little more Pain Location: head Pain Descriptors / Indicators: Headache Pain Intervention(s): Limited activity within patient's tolerance;Monitored during session;Repositioned    Home Living                      Prior Function            PT Goals (current goals can now be found in the care plan section) Acute Rehab PT Goals Patient Stated Goal: agreeable to working with therapies PT Goal Formulation: Patient unable to participate in goal setting Time For Goal Achievement: 03/13/19 Potential to Achieve Goals: Fair Progress towards PT goals: Progressing toward goals    Frequency    Min 2X/week      PT Plan Current plan remains appropriate    Co-evaluation PT/OT/SLP Co-Evaluation/Treatment: Yes Reason for Co-Treatment: Complexity of the patient's impairments (multi-system involvement);For patient/therapist safety;To address functional/ADL transfers PT goals addressed during session: Mobility/safety with mobility;Balance;Proper use of DME OT goals addressed during session: ADL's and self-care;Proper use of Adaptive equipment and DME      AM-PAC PT "6 Clicks" Mobility   Outcome Measure  Help needed turning from your back to your side while in a flat bed without using bedrails?: A Lot Help needed moving from lying on your back to sitting on the side of a flat bed without using bedrails?: A Lot Help needed moving to and from a bed to a chair (including a wheelchair)?: A Lot Help needed standing up from a chair using your arms (e.g., wheelchair or bedside chair)?: A Lot Help needed to walk in hospital room?: Total Help needed climbing 3-5 steps with a railing? : Total 6 Click Score: 10    End of Session Equipment  Utilized During Treatment: Oxygen;Gait belt(trach ) Activity Tolerance: Patient limited by fatigue;Patient limited by pain Patient left: with call bell/phone within reach;in chair Nurse Communication: Mobility status PT Visit Diagnosis: Muscle weakness (generalized) (M62.81);Difficulty in walking, not elsewhere classified (R26.2)     Time: 8016-5537 PT Time Calculation (min) (ACUTE ONLY): 43 min  Charges:  $Therapeutic Activity: 8-22 mins                    Lenward Able Terrial Rhodes, PT Acute Rehabilitation Services Pager [phone]  Office [phone]    Anh Mangano D Donise Woodle 03/13/2019, 1:16 PM

## 2019-03-13 NOTE — Progress Notes (Signed)
NAME:  Gabrielle Wright, MRN:  [id number], DOB:  October 14, 1946, LOS: 37 ADMISSION DATE:  02/03/2019, CONSULTATION DATE: 02/04/2019 REFERRING MD: Emergency department physician CHIEF COMPLAINT: Fever respiratory failure  Brief History   73 year old elderly woman visiting from [place], admitted 3/10 with fevers, hypotension, found to have group A strep bacteremia, required mechanical ventilation Course complicated by non-STEMI and new LV dysfunction ? Ischemic vs Takatsubo's Failed extubation 3/18 due to acute pulmonary edema. S/p trach 3/30.  Past Medical History  Hypertension Coronary artery disease Suspected diabetes.  Significant Hospital Is Events   02/04/2019 transfer from [facility] to [facility] required intubation and pressors. 3/16 off levophed 3/18 extubated briefly but reintubated due to pulmonary edema 3/19 started milrinone 3/20 Episode of respiratory distress,  Precedex changed to propofol Atrial fibrillation RVR >>amiodarone bolus >> sinus rhythm 3/21 rising creat , AF-RVR >> amio bolus 3/24:rising creatinine 3/30: reintubated 3/31 trach 4/1 PICC  Consults:  02/04/2019 ID  IR  Cardiology  Procedures:  ETT 3/10 > 3/18, 3/18 > 3/31 Trach 3/31 > CVL 3/10 > 4/3 PICC 4/1 >   Significant Diagnostic Tests:  Echo 3/15 decreased EF 35 to 40%, LVEDP 27, no significant MR Cardiac cath >> LVEDP 40, RCA totally occluded, patent OM1 stent but distally occluded, apical LAD diffusely diseased, no intervention Chest x-ray 3/22  bilateral lower lobe airspace disease versus layering effusions CXR 3/23> pulmonary edema and bilateral pleural effusions. Echo 3/26 limited> EF 35-40%, elevated LA and LV end-diastolic pressures, Diffuse hypokinesis of LV CXR 4/1> bibasilar opacities, small R pleural effusion, tracheostomy tube present and appropriately positioned  CXR 4/9> bilateral infiltrates with R pleural effusion CXR 4/14> possible left sided infiltrate with a small  effusion, right sided atelectasis  Micro Data:  02/04/2019 blood cultures x2>> Gr A strep 02/04/2019 sputum culture>> neg 02/04/2019 urine culture>>neg 02/04/2019 respiratory virus panel>> rhinovirus 02/04/2019 flu a and B>> neg coronavirus testing 02/04/2019>>neg resp 3/21 >> Klebsiella oxytoca, co-ag negative staph Resp 4/15>  Antimicrobials:  02/04/2019 vancomycin>>3/ 10 02/04/2019 Zosyn>> 3/10 Pen G 3/10 >> 3/22 (plan) Ceftx 3/20 >>3/24 Cipro 3/24>> 3/31  Interim history/subjective:  No acute events overnight; tolerated 2 separate trach collar trials yesterday. Still having significant  Secretions.  Objective   Blood pressure (!) 108/46, pulse 70, temperature (!) 96.6 F (35.9 C), temperature source Axillary, resp. rate 20, height 5\' 6"  (1.676 m), weight 73.6 kg, SpO2 99 %. CVP:  [6 mmHg] 6 mmHg  Vent Mode: PRVC FiO2 (%):  [30 %-35 %] 35 % Set Rate:  [12 bmp] 12 bmp Vt Set:  [450 mL] 450 mL PEEP:  [5 cmH20] 5 cmH20 Plateau Pressure:  [16 cmH20-19 cmH20] 16 cmH20   Intake/Output Summary (Last 24 hours) at 03/13/2019 1141 Last data filed at 03/13/2019 1100 Gross per 24 hour  Intake 1160 ml  Output 850 ml  Net 310 ml   Filed Weights   03/10/19 0500 03/11/19 0700 03/13/19 0441  Weight: 73.7 kg 72.9 kg 73.6 kg   Examination:  General: adult female on vent Neuro: alert, awake, follows commands HEENT: [place]/AT. No JVD. Trach in place.  Cardiovascular: RRR, no MRG.  Lungs: diffuse rhonchi, no increased work of breathing. Abdomen: soft, distended, non tender  Musculoskeletal: No gross deformities, no edema.  Skin: Grossly intact.   Assessment & Plan:   Acute respiratory failure requiring mechanical ventilation - s/p trach 3/31. Weaned on trach collar for several hours yesterday. Will try to do trach collar 2hrs at a time about  twice daily. Appears euvolemic, having more rhonchi and secretions. -trach collar again today to 4 hours twice a day, and increase progressively  -guaifenesin prn, chest physiotherapy -CXR yesterday revealed possible left sided pna - f/u respiratory culture -CXR on 4/17  Ischemic Cardiomyopathy (Echo 3/26 EF 35-40%, LV diffuse hypokinesis) - no targets for revascularization on cath. Due to borderline BPs, she has had to have coreg, hydral, isosorbide held for at least one dose - Continue coreg, hydralazine, imdur, spironolactone, and losartan, asa; started on losartan and spironolactone per cards; consider narrowing therapy based on BP.  - Holding plavix 2/2 Hgb drop and ?bloody NG output last week; since then Hgb stable and no signs of bleeding  Anemia: Report of mild bloody NG output and Hgb drop last week from 8.1>7.3; s/p 1u pRBC per primary. Stable Hgb since then and no signs of bleeding. ASA restarted but still holding plavix, per primary and cards.   Atrial Fibrillation with RVR - resolved. - Continue amio, coreg. No anticoagulation per cards as thought to be 2/2 acute illness and sepsis.   Diabetes type 2/uncontrolled hyperglycemia. - SSI resistant.  Intermittent agitation - exacerbated by language barrier (family video conference has seemed to help).   - per primary   Dysphagia:  - Continue TF Cortrak. May need to consider PEG over the next few days if not able to tolerate trach collar consistently.   CM on board for LTAC/SNF options.   GI ppx: Protonix VAP: per protocol Pain/Sedation: PRN clonazepam DVT ppx: SCDs  Diet: Enteral Nutrition Code Status: full   Nyra Market, MD PGY3 Pager [phone]  03/13/2019 11:41 AM

## 2019-03-13 NOTE — Progress Notes (Signed)
Assisted tele visit to patient with family member.  Adalena Abdulla Anderson, RN   

## 2019-03-14 ENCOUNTER — Inpatient Hospital Stay (HOSPITAL_COMMUNITY): Payer: Medicaid Other

## 2019-03-14 LAB — BASIC METABOLIC PANEL
Anion gap: 6 (ref 5–15)
BUN: 46 mg/dL — ABNORMAL HIGH (ref 8–23)
CO2: 31 mmol/L (ref 22–32)
Calcium: 8.5 mg/dL — ABNORMAL LOW (ref 8.9–10.3)
Chloride: 106 mmol/L (ref 98–111)
Creatinine, Ser: 1.2 mg/dL — ABNORMAL HIGH (ref 0.44–1.00)
GFR calc Af Amer: 52 mL/min — ABNORMAL LOW (ref >60)
GFR calc non Af Amer: 45 mL/min — ABNORMAL LOW (ref >60)
Glucose, Bld: 187 mg/dL — ABNORMAL HIGH (ref 70–99)
Potassium: 4.3 mmol/L (ref 3.5–5.1)
Sodium: 143 mmol/L (ref 135–145)

## 2019-03-14 LAB — CULTURE, RESPIRATORY W GRAM STAIN
Culture: NORMAL
Gram Stain: NONE SEEN

## 2019-03-14 LAB — CBC
HCT: 29.7 % — ABNORMAL LOW (ref 36.0–46.0)
Hemoglobin: 8.5 g/dL — ABNORMAL LOW (ref 12.0–15.0)
MCH: 26.1 pg (ref 26.0–34.0)
MCHC: 28.6 g/dL — ABNORMAL LOW (ref 30.0–36.0)
MCV: 91.1 fL (ref 80.0–100.0)
Platelets: 277 10*3/uL (ref 150–400)
RBC: 3.26 MIL/uL — ABNORMAL LOW (ref 3.87–5.11)
RDW: 17.5 % — ABNORMAL HIGH (ref 11.5–15.5)
WBC: 9.6 10*3/uL (ref 4.0–10.5)
nRBC: 0 % (ref 0.0–0.2)

## 2019-03-14 LAB — GLUCOSE, CAPILLARY
Glucose-Capillary: 175 mg/dL — ABNORMAL HIGH (ref 70–99)
Glucose-Capillary: 176 mg/dL — ABNORMAL HIGH (ref 70–99)
Glucose-Capillary: 190 mg/dL — ABNORMAL HIGH (ref 70–99)
Glucose-Capillary: 200 mg/dL — ABNORMAL HIGH (ref 70–99)
Glucose-Capillary: 206 mg/dL — ABNORMAL HIGH (ref 70–99)
Glucose-Capillary: 239 mg/dL — ABNORMAL HIGH (ref 70–99)

## 2019-03-14 LAB — MAGNESIUM: Magnesium: 2.9 mg/dL — ABNORMAL HIGH (ref 1.7–2.4)

## 2019-03-14 LAB — CULTURE, RESPIRATORY

## 2019-03-14 MED ORDER — CLONAZEPAM 0.5 MG PO TBDP
1.0000 mg | ORAL_TABLET | Freq: Four times a day (QID) | ORAL | Status: DC
  Administered 2019-03-14 – 2019-03-17 (×11): 1 mg
  Filled 2019-03-14 (×11): qty 2

## 2019-03-14 MED ORDER — FUROSEMIDE 10 MG/ML IJ SOLN
40.0000 mg | Freq: Every day | INTRAMUSCULAR | Status: DC
  Administered 2019-03-14 – 2019-03-19 (×6): 40 mg via INTRAVENOUS
  Filled 2019-03-14 (×7): qty 4

## 2019-03-14 MED ORDER — INSULIN GLARGINE 100 UNIT/ML ~~LOC~~ SOLN
25.0000 [IU] | Freq: Every day | SUBCUTANEOUS | Status: DC
  Administered 2019-03-14 – 2019-03-22 (×9): 25 [IU] via SUBCUTANEOUS
  Filled 2019-03-14 (×11): qty 0.25

## 2019-03-14 NOTE — Progress Notes (Signed)
  Speech Language Pathology Treatment: Gabrielle Wright Speaking valve  Patient Details Name: Gabrielle Wright MRN: [id number] DOB: 1946/01/15 Today's Date: 03/14/2019 Time: 4650-3546 SLP Time Calculation (min) (ACUTE ONLY): 19 min  Assessment / Plan / Recommendation Clinical Impression  Attempted PMV placement again today, although this time coordinating efforts with RT, who deflated cuff upon placing pt on TC, in an attempt to decrease anxiety during valve placement. Unfortunately pt remained very anxious, indicating through tele-interpreter (#[id number]) and yes/no questions that she felt like she was having trouble breathing, despite SpO2 at 97-100% across session. Her RR at its highest still remained below 30. PMV was placed for >60 seconds at a time with no volitional voicing produced, but with consistent, spontaneous vocalizations noted upon exhalation. She expectorated a moderate to large amount of sputum orally upon initial placement. Mod-Max cues were provided for pt to attempt speaking, but suspect that her anxiety limits this at this time. Given the above, PO trials were also held at this time. Will continue to follow for readiness. Would use PMV with SLP only at this time.   HPI HPI: 73 y.o. female admitted on 02/03/19 for fever and cough (to [facility]) and was transferred to [facility] on 02/04/19 for hypotension and respiratory distress (acute respiratory failure/pulmonary edema) requiring intubation 3/10-current (time of PT eval 02/27/2019).  She was visiting family from [place] and COVID 19 tests were negative.  She was found to have group A strep bacteremia (septic shiock), rhinovirus,  and course complicated by NSTEMI and new LV dysfunction s/p cardiac cath on 02/09/19.  Cardiology following.  Other dx include ischemic cardiomyopathy, hyperkalemia, DM2- uncontrolled, and abdominal distention.  Pt with significant PMH of DM, HTN, anc CAD. Trach placed on 3/31      SLP Plan  Continue with current  plan of care       Recommendations         Patient may use Passy-Muir Speech Valve: with SLP only         Oral Care Recommendations: Oral care QID Follow up Recommendations: Skilled Nursing facility 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.1) Plan: Continue with current plan of care       GO                [name] Kalen Neidert 03/14/2019, 4:21 PM  [name], M.A. CCC-SLP Acute Herbalist [phone] Office [phone]

## 2019-03-14 NOTE — Progress Notes (Signed)
PROGRESS NOTE    Gabrielle Wright  SUP:[id number] DOB: 1946-06-20 DOA: 02/03/2019 PCP: Rudene Anda, MD   Brief Narrative:  73 year old female PMHx from [place]. HTN , CAD, DM Type 2?  Noted to have a high fever 103.9. Reported to have cough. Taken to [facility] per family. Was supported to have a urinary tract infection although her urine was clean. She never filled her prescription for antibiotics. Due to persistent hypotension and refractory nature of her hypertension she is transferred to [facility] 02/04/2019. She was in respiratory distress for abdominal chest wall paradoxus. She required intubation and central line placement. She had maxed out on peripheral IV vasopressors therefore central line was placed. Pulmonary critical care will be responsible for care at this time. She was placed in isolation since she came from a 72 entry of [place] with recent reports of coronavirus and that country. Note [place] is not on the list of infected countries. Infectious disease was called and they reported again diagnosis done on 1 reported countries.     Subjective: 4/17 A/O, negative CP negative S OB negative abdominal pain indicates she is wet needs changing.   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 - Continue weaning today.  Have  increased anxiety medication, which I believe is a large part of patient not being able to get down to 28% FiO2 - See ischemic cardiomyopathy  Ischemic cardiomyopathy-acute on chronic systolic and diastolic CHF - 5/92 echocardiogram EF 35 to 40%, LV diffuse hypokinesis, no targets for revascularization on catheterization - Cardiology following  -4/16 CVP? -Strict in and out +5.8 L -Daily weight Filed Weights   03/11/19 0700 03/13/19 0441 03/14/19 0426  Weight: 72.9 kg 73.6 kg 76 kg  -Amiodarone 200 mg daily -Coreg 3.125 mg twice daily -Furosemide 40 mg daily -Plavix 75 mg daily (hold) -Hydralazine 10 mg 3 times daily -Isosorbide dinitrate 10 mg 3 times daily -Spironolactone 12.5 mg daily -4/9 transfuse 1 unit PRBC -Transfuse for hemoglobin<8 per AHA guidelines.  Unfortunately unless patient in distress may not be able to transfuse until hemoglobin<7 secondary to blood shortage.:  A. fib with RVR - See ischemic cardiomyopathy  Hypernatremia - Resolved  Diabetes type 2 uncontrolled with complication - 9/24 hemoglobin A1c= 10.1 - 4/16 increase Lantus 20 units daily - Custom SSI   Agitation/Anxiety -Exacerbated by language barrier.  National language? - Clonazepam 1 mg QID -Lorazepam 0.5 mg PRN -Zoloft 25 mg daily -During the day all shades and lights on  Insomnia  -Trazodone 50 mg daily  Dysphasia - Feedings per core track on hold secondary to abdominal pain. - 4/16 consult for swallow evaluation   GI bleed Recent Labs  Lab 03/09/19 0418 03/10/19 0448 03/12/19 1714 03/13/19 0425 03/14/19 0425  HGB 8.9* 9.1* 9.0* 8.4* 8.5*  -Resolved patient's hemoglobin is stable  DVT prophylaxis: S CD Code Status: Full Family Communication:  None Disposition Plan: TBD   Consultants:  Cardiology PCCM    Procedures/Significant Events:  4/8 acute abdominal series: Negative ileus or SBO 4/9 PCXR: Unchanged layering pleural effusion and pulmonary opacification      I have personally reviewed and interpreted all radiology studies and my findings are as above.  VENTILATOR SETTINGS: Mode: PRVC (at night) Vt set: 450 Set rate: 12 FiO2 30% PEEP: 5  (Daytime) TC FiO2: 35% SPO2: 100%   Cultures   Antimicrobials: Anti-infectives (From admission, onward)   Start     Ordered Stop   02/19/19 1200  ciprofloxacin (CIPRO) IVPB 400 mg     02/19/19 1119 02/25/19 1321   02/18/19 1230  ciprofloxacin (CIPRO) IVPB 400 mg  Status:  Discontinued     02/18/19 1127 02/19/19 1119   02/14/19 0900  cefTRIAXone (ROCEPHIN) 2 g in sodium chloride 0.9 % 100 mL IVPB     02/14/19 0846 02/16/19 1033   02/06/19 1900  penicillin G potassium 8 Million Units in dextrose 5 % 500 mL continuous infusion  Status:  Discontinued     02/06/19 1900 02/14/19 0846   02/06/19 1100  penicillin G potassium 8 Million Units in dextrose 5 % 500 mL continuous infusion  Status:  Discontinued     02/06/19 1008 02/06/19 1900   02/05/19 2300  penicillin G potassium 4 Million Units in dextrose 5 % 250 mL IVPB  Status:  Discontinued     02/04/19 2216 02/04/19 2237   02/04/19 2300  vancomycin (VANCOCIN) 1,500 mg in sodium chloride 0.9 % 500 mL IVPB     02/04/19 2216 02/05/19 0308   02/04/19 2300  penicillin G potassium 4 Million Units in dextrose 5 % 250 mL IVPB  Status:  Discontinued     02/04/19 2237 02/06/19 1008   02/04/19 2230  clindamycin (CLEOCIN) IVPB 600 mg  Status:  Discontinued     02/04/19 2216 02/05/19 0917   02/04/19 0315  piperacillin-tazobactam (ZOSYN) IVPB 3.375 g     02/04/19 0303 02/04/19 0408   02/04/19 0315  vancomycin (VANCOCIN) IVPB 1000 mg/200 mL premix     02/04/19 0303 02/04/19 0424        Devices    LINES / TUBES:  6 mm cuffed trach 3/31>>    Continuous Infusions: . sodium chloride Stopped (03/04/19 0815)     Objective: Vitals:   03/14/19 0427 03/14/19 0500 03/14/19 0600 03/14/19 0731  BP:  132/63 (!) 117/54   Pulse:  88 80   Resp:  (!) 21 15    Temp:    99 F (37.2 C)  TempSrc:    Oral  SpO2: 100% 100% 100%   Weight:      Height:        Intake/Output Summary (Last 24 hours) at 03/14/2019 0754 Last data filed at 03/14/2019 0600 Gross per 24 hour  Intake 2111.66 ml  Output 380 ml  Net 1731.66 ml   Filed Weights   03/11/19 0700 03/13/19 0441 03/14/19 0426  Weight: 72.9 kg 73.6 kg 76 kg   General: A/O, positive acute respiratory distress Eyes: negative scleral hemorrhage, negative anisocoria, negative icterus ENT: Negative Runny nose, negative gingival bleeding, Neck:  Negative scars, masses, torticollis, lymphadenopathy, JVD, #6 cuffed trach in place negative sign of infection or bleeding Lungs: Clear to auscultation bilaterally without wheezes or crackles Cardiovascular: Regular rate and rhythm without murmur gallop or rub normal S1 and S2 Abdomen: negative abdominal pain, nondistended, positive soft, bowel sounds, no rebound, no ascites,  no appreciable mass Extremities: No significant cyanosis, clubbing, or edema bilateral lower extremities Skin: Negative rashes, lesions, ulcers Psychiatric:  Negative depression, negative anxiety, negative fatigue, negative mania  Central nervous system:  Cranial nerves II through XII intact, tongue/uvula midline, all extremities muscle strength 5/5, sensation intact throughout, negative receptive aphasia.      Data Reviewed: Care during the described time interval was provided by me .  I have reviewed this patient's available data, including medical history, events of note, physical examination, and all test results as part of my evaluation.   CBC: Recent Labs  Lab 03/09/19 0418 03/10/19 0448 03/12/19 1714 03/13/19 0425 03/14/19 0425  WBC 8.3 8.8 10.4 10.1 9.6  HGB 8.9* 9.1* 9.0* 8.4* 8.5*  HCT 31.2* 31.7* 31.7* 30.0* 29.7*  MCV 88.9 88.8 88.8 91.2 91.1  PLT 276 305 306 282 333   Basic Metabolic Panel: Recent Labs  Lab 03/08/19 1810 03/09/19 0418 03/10/19 0448 03/11/19  0416 03/12/19 0508 03/14/19 0425  NA  --  140 141 141 141 143  K  --  3.9 3.6 4.0 3.8 4.3  CL  --  102 101 104 105 106  CO2  --  28 26 27 25 31   GLUCOSE  --  99 130* 218* 239* 187*  BUN  --  43* 35* 41* 43* 46*  CREATININE  --  1.42* 1.21* 1.43* 1.35* 1.20*  CALCIUM  --  8.7* 8.8* 8.4* 8.4* 8.5*  MG 2.5* 2.4 2.3  --  2.7* 2.9*  PHOS 5.6* 5.7* 4.1 4.7* 3.4  --    GFR: Estimated Creatinine Clearance: 44.2 mL/min (A) (by C-G formula based on SCr of 1.2 mg/dL (H)). Liver Function Tests: Recent Labs  Lab 03/08/19 0352 03/09/19 0418 03/10/19 0448 03/11/19 0416 03/12/19 0508  ALBUMIN 2.5* 2.4* 2.4* 2.3* 2.4*   No results for input(s): LIPASE, AMYLASE in the last 168 hours. No results for input(s): AMMONIA in the last 168 hours. Coagulation Profile: No results for input(s): INR, PROTIME in the last 168 hours. Cardiac Enzymes: No results for input(s): CKTOTAL, CKMB, CKMBINDEX, TROPONINI in the last 168 hours. BNP (last 3 results) No results for input(s): PROBNP in the last 8760 hours. HbA1C: No results for input(s): HGBA1C in the last 72 hours. CBG: Recent Labs  Lab 03/13/19 1608 03/13/19 2033 03/14/19 0021 03/14/19 0423 03/14/19 0732  GLUCAP 176* 240* 206* 176* 175*   Lipid Profile: No results for input(s): CHOL, HDL, LDLCALC, TRIG, CHOLHDL, LDLDIRECT in the last 72 hours. Thyroid Function Tests: No results for input(s): TSH, T4TOTAL, FREET4, T3FREE, THYROIDAB in the last 72 hours. Anemia Panel: No results for input(s): VITAMINB12, FOLATE, FERRITIN, TIBC, IRON, RETICCTPCT in the last 72 hours. Urine analysis:    Component Value Date/Time   COLORURINE YELLOW 02/04/2019 0123   APPEARANCEUR CLEAR 02/04/2019 0123   LABSPEC 1.015 02/04/2019 0123   PHURINE 5.5 02/04/2019 0123   GLUCOSEU NEGATIVE 02/04/2019 0123   HGBUR NEGATIVE 02/04/2019 0123   BILIRUBINUR NEGATIVE 02/04/2019 0123   KETONESUR NEGATIVE 02/04/2019 0123   PROTEINUR NEGATIVE 02/04/2019 0123   NITRITE  NEGATIVE 02/04/2019 0123   LEUKOCYTESUR NEGATIVE 02/04/2019 0123   Sepsis Labs: @LABRCNTIP (procalcitonin:4,lacticidven:4)  ) Recent Results (from the past 240 hour(s))  Culture, respiratory (non-expectorated)     Status: None (Preliminary result)   Collection Time: 03/12/19  9:25 AM  Result Value Ref Range Status   Specimen Description TRACHEAL ASPIRATE  Final   Special Requests NONE  Final   Gram Stain NO WBC SEEN  RARE GRAM POSITIVE COCCI IN PAIRS   Final   Culture   Final    CULTURE REINCUBATED FOR BETTER GROWTH Performed at [facility], [place] [address], [place], [place] [address]    Report Status PENDING  Incomplete         Radiology Studies: No results found.      Scheduled Meds: . sodium chloride   Intravenous Once  . amiodarone  200 mg Per Tube Daily  . aspirin EC  81 mg Oral Daily  . atorvastatin  40 mg Per Tube q1800  . carvedilol  3.125 mg Per NG tube BID WC  . chlorhexidine gluconate (MEDLINE KIT)  15 mL Mouth Rinse BID  . Chlorhexidine Gluconate Cloth  6 each Topical Q0600  . clonazepam  1 mg Per Tube TID  . feeding supplement (GLUCERNA 1.5 CAL)  1,000 mL Per Tube Q24H  . feeding supplement (PRO-STAT SUGAR FREE 64)  30 mL Per Tube Daily  . hydrALAZINE  10 mg Per Tube Q8H  . insulin aspart  0-24 Units Subcutaneous Q4H  . insulin glargine  20 Units Subcutaneous QHS  . isosorbide dinitrate  10 mg Per Tube TID  . losartan  25 mg Per Tube Daily  . mouth rinse  15 mL Mouth Rinse 10 times per day  . multivitamin  15 mL Per Tube Daily  . pantoprazole (PROTONIX) IV  40 mg Intravenous Q12H  . polyethylene glycol  17 g Oral BID  . sertraline  25 mg Per Tube Daily  . sodium chloride flush  10-40 mL Intracatheter Q12H  . spironolactone  12.5 mg Per Tube Daily  . traZODone  50 mg Per Tube QHS   Continuous Infusions: . sodium chloride Stopped (03/04/19 0815)     LOS: 38 days    Time spent: 40 minutes     Lajuanda Penick, Geraldo Docker, MD [facility]  Pager [phone]  If 7PM-7AM, please contact night-coverage [email or web address] Password [facility] 03/14/2019, 7:54 AM

## 2019-03-14 NOTE — Progress Notes (Signed)
Placed pt back in full support.  Pt very anxious and SPO2 83% with good waveform.  Will attempt ATC later today

## 2019-03-14 NOTE — Progress Notes (Signed)
Physical Therapy Treatment Patient Details Name: Gabrielle Wright MRN: [id number] DOB: 1946/08/09 Today's Date: 03/14/2019    History of Present Illness 73 y.o. female admitted on 02/03/19 for fever and cough (to [facility]) and was transferred to [facility] on 02/04/19 for hypotension and respiratory distress (acute respiratory failure/pulmonary edema) requiring intubation 3/10-current (time of PT eval 02/27/2019).  She was visiting family from [place] and COVID 19 tests were negative.  She was found to have group A strep bacteremia (septic shiock), rhinovirus,  and course complicated by NSTEMI and new LV dysfunction s/p cardiac cath on 02/09/19.  Cardiology following.  Other dx include ischemic cardiomyopathy, hyperkalemia, DM2- uncontrolled, and abdominal distention.  Pt with significant PMH of DM, HTN, anc CAD. Trach placed on 3/31    PT Comments    Pt's session limited by interpreter service unavailable.  Pt could not be made to understand this and wanted to keep writing what she wanted in [place].  Emphasis on warm up exercise, transition to EOB, sit to stand and transfers.  Pt fatigued before gait attempts.    Follow Up Recommendations  SNF     Equipment Recommendations  Other (comment)(TBA)    Recommendations for Other Services       Precautions / Restrictions Precautions Precautions: Fall Precaution Comments: trach, vent    Mobility  Bed Mobility Overal bed mobility: Needs Assistance Bed Mobility: Rolling;Sidelying to Sit Rolling: Mod assist Sidelying to sit: Mod assist       General bed mobility comments: cued for the roll with visual cues and assisted slowly to give pt time to assist with L UE.  Cues were hand over hand since to interpreter available.,  Transfers Overall transfer level: Needs assistance Equipment used: 1 person hand held assist;2 person hand held assist Transfers: Sit to/from [employer] Sit to Stand: Mod assist;+2 physical  assistance;From elevated surface;+2 safety/equipment Stand pivot transfers: Mod assist;+2 physical assistance;+2 safety/equipment       General transfer comment: pt able to attain full upright stance and with sufficient truncal support was able to complete pivotal steps to the chair.  Ambulation/Gait                 Stairs             Wheelchair Mobility    Modified Rankin (Stroke Patients Only)       Balance Overall balance assessment: Needs assistance Sitting-balance support: Bilateral upper extremity supported Sitting balance-Leahy Scale: Poor Sitting balance - Comments: At EOB pt able to maintain sit with uE assist, but listed posteriorly with fatigue.     Standing balance-Leahy Scale: Poor                              Cognition Arousal/Alertness: Awake/alert Behavior During Therapy: WFL for tasks assessed/performed Overall Cognitive Status: Difficult to assess                                 General Comments: There was no interpreter service today and pt did not acknowledge there was no service and just kept talking unintelllibibly in b[place]      Exercises Other Exercises Other Exercises: AA/graded resistance bil hip flexion/ext bil x10 reps    General Comments        Pertinent Vitals/Pain Pain Assessment: Faces Faces Pain Scale: No hurt    Home Living  Prior Function            PT Goals (current goals can now be found in the care plan section) Acute Rehab PT Goals Patient Stated Goal: not stated today PT Goal Formulation: Patient unable to participate in goal setting Time For Goal Achievement: 03/13/19 Potential to Achieve Goals: Fair Progress towards PT goals: Progressing toward goals    Frequency    Min 2X/week      PT Plan Current plan remains appropriate    Co-evaluation              AM-PAC PT "6 Clicks" Mobility   Outcome Measure  Help needed turning  from your back to your side while in a flat bed without using bedrails?: A Lot Help needed moving from lying on your back to sitting on the side of a flat bed without using bedrails?: A Lot Help needed moving to and from a bed to a chair (including a wheelchair)?: A Lot Help needed standing up from a chair using your arms (e.g., wheelchair or bedside chair)?: A Lot Help needed to walk in hospital room?: Total Help needed climbing 3-5 steps with a railing? : Total 6 Click Score: 10    End of Session Equipment Utilized During Treatment: Oxygen Activity Tolerance: Patient limited by fatigue;Patient tolerated treatment well Patient left: with call bell/phone within reach;in chair Nurse Communication: Mobility status PT Visit Diagnosis: Muscle weakness (generalized) (M62.81);Other abnormalities of gait and mobility (R26.89)     Time: 1103-1130 PT Time Calculation (min) (ACUTE ONLY): 27 min  Charges:  $Therapeutic Exercise: 8-22 mins $Therapeutic Activity: 8-22 mins                     03/14/2019  [place] Bing, PT Acute Rehabilitation Services [phone]  (pager) [phone]  (office)   [name] Mellanie Bejarano 03/14/2019, 1:25 PM

## 2019-03-14 NOTE — Progress Notes (Signed)
NAME:  Gabrielle Wright, MRN:  [id number], DOB:  December 16, 1945, LOS: 38 ADMISSION DATE:  02/03/2019, CONSULTATION DATE: 02/04/2019 REFERRING MD: Emergency department physician CHIEF COMPLAINT: Fever respiratory failure  Brief History   73 year old elderly woman visiting from [place], admitted 3/10 with fevers, hypotension, found to have group A strep bacteremia, required mechanical ventilation Course complicated by non-STEMI and new LV dysfunction ? Ischemic vs Takatsubo's Failed extubation 3/18 due to acute pulmonary edema. S/p trach 3/30.  Past Medical History  Hypertension Coronary artery disease Suspected diabetes.  Significant Hospital Is Events   02/04/2019 transfer from [facility] to [facility] required intubation and pressors. 3/16 off levophed 3/18 extubated briefly but reintubated due to pulmonary edema 3/19 started milrinone 3/20 Episode of respiratory distress,  Precedex changed to propofol Atrial fibrillation RVR >>amiodarone bolus >> sinus rhythm 3/21 rising creat , AF-RVR >> amio bolus 3/24:rising creatinine 3/30: reintubated 3/31 trach 4/1 PICC  Consults:  02/04/2019 ID  IR  Cardiology  Procedures:  ETT 3/10 > 3/18, 3/18 > 3/31 Trach 3/31 > CVL 3/10 > 4/3 PICC 4/1 >   Significant Diagnostic Tests:  Echo 3/15 decreased EF 35 to 40%, LVEDP 27, no significant MR Cardiac cath >> LVEDP 40, RCA totally occluded, patent OM1 stent but distally occluded, apical LAD diffusely diseased, no intervention Chest x-ray 3/22  bilateral lower lobe airspace disease versus layering effusions CXR 3/23> pulmonary edema and bilateral pleural effusions. Echo 3/26 limited> EF 35-40%, elevated LA and LV end-diastolic pressures, Diffuse hypokinesis of LV CXR 4/1> bibasilar opacities, small R pleural effusion, tracheostomy tube present and appropriately positioned  CXR 4/9> bilateral infiltrates with R pleural effusion CXR 4/14> possible left sided infiltrate with a small  effusion, right sided atelectasis CXR 4/7> L>R airspace disease, atelectasis vs pna; inc congestion  Micro Data:  02/04/2019 blood cultures x2>> Gr A strep 02/04/2019 sputum culture>> neg 02/04/2019 urine culture>>neg 02/04/2019 respiratory virus panel>> rhinovirus 02/04/2019 flu a and B>> neg coronavirus testing 02/04/2019>>neg resp 3/21 >> Klebsiella oxytoca, co-ag negative staph Resp 4/15>> neg  Antimicrobials:  02/04/2019 vancomycin>>3/ 10 02/04/2019 Zosyn>> 3/10 Pen G 3/10 >> 3/22 (plan) Ceftx 3/20 >>3/24 Cipro 3/24>> 3/31  Interim history/subjective:  No acute events overnight; tolerated ~5hr trach collar trial yesterday, no trial in the PM but had PT/OT as well. She continues to have significant secretions  Objective   Blood pressure (!) 134/59, pulse 92, temperature 99 F (37.2 C), temperature source Oral, resp. rate (!) 24, height 5\' 6"  (1.676 m), weight 76 kg, SpO2 98 %.    Vent Mode: PRVC FiO2 (%):  [30 %-35 %] 30 % Set Rate:  [12 bmp] 12 bmp Vt Set:  [450 mL] 450 mL PEEP:  [5 cmH20] 5 cmH20 Plateau Pressure:  [17 cmH20-19 cmH20] 19 cmH20   Intake/Output Summary (Last 24 hours) at 03/14/2019 0920 Last data filed at 03/14/2019 0800 Gross per 24 hour  Intake 1931.66 ml  Output 380 ml  Net 1551.66 ml   Filed Weights   03/11/19 0700 03/13/19 0441 03/14/19 0426  Weight: 72.9 kg 73.6 kg 76 kg   Examination:  General: adult female on vent Neuro: alert, awake, follows commands HEENT: [place]/AT. No JVD. Trach in place.  Cardiovascular: RRR, no MRG.  Lungs: diffuse rhonchi, no increased work of breathing. Abdomen: soft, distended, non tender  Musculoskeletal: No gross deformities, no edema.  Skin: Grossly intact.   Assessment & Plan:   Acute respiratory failure requiring mechanical ventilation - s/p trach 3/31. Weaned on  trach collar for several hours yesterday.  -trach collar again today to 5 hours twice a day, and increase progressively -guaifenesin prn, chest  physiotherapy -CXR 4/14 revealed possible left sided pna - resp culture negative -consider adding back lasix as standing dose as today's CXR shows some increase in vascular congestion  Ischemic Cardiomyopathy (Echo 3/26 EF 35-40%, LV diffuse hypokinesis) - no targets for revascularization on cath. Due to borderline BPs, she has had to have coreg, hydral, isosorbide held for at least one dose - Continue coreg, hydralazine, imdur, spironolactone, and losartan, asa; started on losartan and spironolactone per cards; consider narrowing therapy based on BP.  - Holding plavix 2/2 Hgb drop and ?bloody NG output last week; since then Hgb stable and no signs of bleeding - consider restarting lasix  Anemia: Report of mild bloody NG output and Hgb drop last week from 8.1>7.3; s/p 1u pRBC per primary. Stable Hgb since then and no signs of bleeding. ASA restarted but still holding plavix, per primary and cards.   Atrial Fibrillation with RVR - resolved. - Continue amio, coreg. No anticoagulation per cards as thought to be 2/2 acute illness and sepsis.   Diabetes type 2/uncontrolled hyperglycemia. - Lantus, SSI  Intermittent agitation - exacerbated by language barrier (family video conference has seemed to help).   - per primary   Dysphagia:  - Continue TF Cortrak. May need to consider PEG over the next few days if not able to tolerate trach collar consistently.   CM on board for LTAC/SNF options.   GI ppx: Protonix VAP: per protocol Pain/Sedation: PRN clonazepam DVT ppx: SCDs  Diet: Enteral Nutrition Code Status: full   Nyra Market, MD PGY3 Pager [phone]  03/14/2019 9:20 AM

## 2019-03-15 ENCOUNTER — Inpatient Hospital Stay (HOSPITAL_COMMUNITY): Payer: Medicaid Other

## 2019-03-15 LAB — GLUCOSE, CAPILLARY
Glucose-Capillary: 159 mg/dL — ABNORMAL HIGH (ref 70–99)
Glucose-Capillary: 159 mg/dL — ABNORMAL HIGH (ref 70–99)
Glucose-Capillary: 185 mg/dL — ABNORMAL HIGH (ref 70–99)
Glucose-Capillary: 197 mg/dL — ABNORMAL HIGH (ref 70–99)
Glucose-Capillary: 209 mg/dL — ABNORMAL HIGH (ref 70–99)
Glucose-Capillary: 226 mg/dL — ABNORMAL HIGH (ref 70–99)
Glucose-Capillary: 241 mg/dL — ABNORMAL HIGH (ref 70–99)

## 2019-03-15 LAB — BASIC METABOLIC PANEL
Anion gap: 10 (ref 5–15)
BUN: 49 mg/dL — ABNORMAL HIGH (ref 8–23)
CO2: 27 mmol/L (ref 22–32)
Calcium: 8.3 mg/dL — ABNORMAL LOW (ref 8.9–10.3)
Chloride: 106 mmol/L (ref 98–111)
Creatinine, Ser: 1.23 mg/dL — ABNORMAL HIGH (ref 0.44–1.00)
GFR calc Af Amer: 51 mL/min — ABNORMAL LOW (ref >60)
GFR calc non Af Amer: 44 mL/min — ABNORMAL LOW (ref >60)
Glucose, Bld: 222 mg/dL — ABNORMAL HIGH (ref 70–99)
Potassium: 4.1 mmol/L (ref 3.5–5.1)
Sodium: 143 mmol/L (ref 135–145)

## 2019-03-15 LAB — CBC
HCT: 28.1 % — ABNORMAL LOW (ref 36.0–46.0)
Hemoglobin: 8.2 g/dL — ABNORMAL LOW (ref 12.0–15.0)
MCH: 26.5 pg (ref 26.0–34.0)
MCHC: 29.2 g/dL — ABNORMAL LOW (ref 30.0–36.0)
MCV: 90.9 fL (ref 80.0–100.0)
Platelets: 273 10*3/uL (ref 150–400)
RBC: 3.09 MIL/uL — ABNORMAL LOW (ref 3.87–5.11)
RDW: 17.7 % — ABNORMAL HIGH (ref 11.5–15.5)
WBC: 8.9 10*3/uL (ref 4.0–10.5)
nRBC: 0 % (ref 0.0–0.2)

## 2019-03-15 LAB — MAGNESIUM: Magnesium: 2.9 mg/dL — ABNORMAL HIGH (ref 1.7–2.4)

## 2019-03-15 MED ORDER — SERTRALINE HCL 50 MG PO TABS: 50.0000 mg | ORAL_TABLET | Freq: Every day | ORAL | Status: DC

## 2019-03-15 MED ORDER — SERTRALINE HCL 20 MG/ML PO CONC
50.0000 mg | Freq: Every day | ORAL | Status: DC
  Filled 2019-03-15: qty 2.5

## 2019-03-15 MED ORDER — SERTRALINE HCL 50 MG PO TABS
50.0000 mg | ORAL_TABLET | Freq: Every day | ORAL | Status: DC
  Administered 2019-03-16 – 2019-03-21 (×6): 50 mg
  Filled 2019-03-15 (×6): qty 1

## 2019-03-15 MED ORDER — MONTELUKAST SODIUM 10 MG PO TABS
10.0000 mg | ORAL_TABLET | Freq: Every day | ORAL | Status: DC
  Administered 2019-03-15 – 2019-03-26 (×12): 10 mg via ORAL
  Filled 2019-03-15 (×12): qty 1

## 2019-03-15 MED ORDER — SERTRALINE HCL 20 MG/ML PO CONC: 50.0000 mg | Freq: Every day | ORAL | Status: DC

## 2019-03-15 MED ORDER — MORPHINE SULFATE 10 MG/5ML PO SOLN
2.5000 mg | Freq: Four times a day (QID) | ORAL | Status: DC | PRN
  Administered 2019-03-16 – 2019-03-20 (×4): 2.5 mg via ORAL
  Filled 2019-03-15 (×5): qty 2

## 2019-03-15 NOTE — Progress Notes (Signed)
Assisted tele visit to patient with daughter.  Barnes Florek M, RN  

## 2019-03-15 NOTE — Progress Notes (Signed)
PROGRESS NOTE    Gabrielle Wright  YTW:[id number] DOB: 06-Apr-1946 DOA: 02/03/2019 PCP: Rudene Anda, MD   Brief Narrative:  73 year old female PMHx from [place]. HTN , CAD, DM Type 2?  Noted to have a high fever 103.9. Reported to have cough. Taken to [facility] per family. Was supported to have a urinary tract infection although her urine was clean. She never filled her prescription for antibiotics. Due to persistent hypotension and refractory nature of her hypertension she is transferred to [facility] 02/04/2019. She was in respiratory distress for abdominal chest wall paradoxus. She required intubation and central line placement. She had maxed out on peripheral IV vasopressors therefore central line was placed. Pulmonary critical care will be responsible for care at this time. She was placed in isolation since she came from a [place] entry of [place] with recent reports of coronavirus and that country. Note [place] is not on the list of infected countries. Infectious disease was called and they reported again diagnosis done on 1 reported countries.   03/15/2019: Patient seen alongside patient's nurse.  Also discussed with the patient's nurse extensively.  Lab work revealed.  Nursing staff is worried that patient is significantly anxious.  Patient is already on benzodiazepines, as well as SSRI and TCA.  Will minimize, and optimize mind on current medications.  Patient reports headache.  Available records reviewed.  Subjective: Reports headache.  Otherwise, no new complaints.  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 - Continue weaning today.  Have increased anxiety medication, which I believe is a large part of patient not being able to get down to 28% FiO2 - See ischemic cardiomyopathy 03/15/2019: Continue current management.  Ischemic cardiomyopathy-acute on chronic systolic and diastolic CHF - 7/71 echocardiogram EF 35 to 40%, LV diffuse hypokinesis, no targets for revascularization on catheterization - Cardiology following  -4/16 CVP? -Strict in and out +5.8 L -Daily weight Filed Weights   03/13/19 0441 03/14/19 0426 03/15/19 0500  Weight: 73.6 kg 76 kg 74.5 kg  -Amiodarone 200 mg daily -Coreg 3.125 mg twice daily -Furosemide 40 mg daily -Plavix 75 mg daily (hold) -Hydralazine 10 mg 3 times daily -Isosorbide dinitrate 10 mg 3 times daily -Spironolactone 12.5 mg daily -4/9 transfuse 1 unit PRBC -Transfuse for hemoglobin<8 per AHA guidelines.  Unfortunately unless patient in distress may not be able to transfuse until hemoglobin<7 secondary to blood shortage. 03/15/2019: Seems stable.  Continue current management.  A. fib with RVR - See ischemic cardiomyopathy  Hypernatremia - Resolved  Diabetes type 2 uncontrolled with complication - 1/65 hemoglobin A1c= 10.1 - 4/16 increase Lantus 20 units daily - Custom SSI 03/15/2019: Continue to optimize.   Agitation/Anxiety -Exacerbated by language barrier.  National language? - Clonazepam 1 mg QID -Lorazepam 0.5 mg PRN -Zoloft 25 mg daily -During the day all shades and lights on 03/15/2019: Increase Zoloft to 50 mg p.o. once  daily.  Optimize use of mind altering medications.  Insomnia  -Trazodone 50 mg daily  Dysphasia - Feedings per core track on hold secondary to  abdominal pain. - 4/16 consult for swallow evaluation   GI bleed Recent Labs  Lab 03/10/19 0448 03/12/19 1714 03/13/19 0425 03/14/19 0425 03/15/19 0404  HGB 9.1* 9.0* 8.4* 8.5* 8.2*  -Resolved patient's hemoglobin is stable  DVT prophylaxis: S CD Code Status: Full Family Communication: None Disposition Plan: TBD   Consultants:  Cardiology PCCM    Procedures/Significant Events:  4/8 acute abdominal series: Negative ileus or SBO 4/9 PCXR: Unchanged layering pleural effusion and pulmonary opacification     I have personally reviewed and interpreted all radiology studies and my findings are as above.  VENTILATOR SETTINGS: Mode: PRVC (at night) Vt set: 450 Set rate: 12 FiO2 30% PEEP: 5  (Daytime) TC FiO2: 35% SPO2: 100%   Cultures   Antimicrobials: Anti-infectives (From admission, onward)   Start     Ordered Stop   02/19/19 1200  ciprofloxacin (CIPRO) IVPB 400 mg     02/19/19 1119 02/25/19 1321   02/18/19 1230  ciprofloxacin (CIPRO) IVPB 400 mg  Status:  Discontinued     02/18/19 1127 02/19/19 1119   02/14/19 0900  cefTRIAXone (ROCEPHIN) 2 g in sodium chloride 0.9 % 100 mL IVPB     02/14/19 0846 02/16/19 1033   02/06/19 1900  penicillin G potassium 8 Million Units in dextrose 5 % 500 mL continuous infusion  Status:  Discontinued     02/06/19 1900 02/14/19 0846   02/06/19 1100  penicillin G potassium 8 Million Units in dextrose 5 % 500 mL continuous infusion  Status:  Discontinued     02/06/19 1008 02/06/19 1900   02/05/19 2300  penicillin G potassium 4 Million Units in dextrose 5 % 250 mL IVPB  Status:  Discontinued     02/04/19 2216 02/04/19 2237   02/04/19 2300  vancomycin (VANCOCIN) 1,500 mg in sodium chloride 0.9 % 500 mL IVPB     02/04/19 2216 02/05/19 0308   02/04/19 2300  penicillin G potassium 4 Million Units in dextrose 5 % 250 mL IVPB  Status:  Discontinued     02/04/19 2237 02/06/19 1008   02/04/19 2230  clindamycin (CLEOCIN) IVPB 600 mg   Status:  Discontinued     02/04/19 2216 02/05/19 0917   02/04/19 0315  piperacillin-tazobactam (ZOSYN) IVPB 3.375 g     02/04/19 0303 02/04/19 0408   02/04/19 0315  vancomycin (VANCOCIN) IVPB 1000 mg/200 mL premix     02/04/19 0303 02/04/19 0424        Devices    LINES / TUBES:  6 mm cuffed trach 3/31>>    Continuous Infusions:  sodium chloride Stopped (03/04/19 0815)     Objective: Vitals:   03/15/19 1000 03/15/19 1100 03/15/19 1126 03/15/19 1133  BP: 126/64 128/67 128/67   Pulse: 80 90    Resp: 16 (!) 22    Temp:    98.3 F (36.8 C)  TempSrc:    Oral  SpO2: 99% 99%    Weight:      Height:        Intake/Output Summary (Last 24 hours) at 03/15/2019 1230 Last data filed at 03/15/2019 1100 Gross per 24 hour  Intake 1260 ml  Output 1200 ml  Net 60 ml   Filed Weights   03/13/19 0441 03/14/19 0426 03/15/19 0500  Weight: 73.6 kg 76 kg 74.5 kg   General: Awake  and alert.  Not in any acute respiratory distress. HEENT: Pallor.  No jaundice.   Neck: Supple.  No JVD.  Trach in situ.  Lungs: Clear to auscultation. Cardiovascular: S1-S2.   Abdomen: Soft and nontender.  Organs are difficult to assess.   Extremities: No leg edema. Central nervous system: Awake and alert.  Patient moves all extremities.  Data Reviewed: Care during the described time interval was provided by me .  I have reviewed this patient's available data, including medical history, events of note, physical examination, and all test results as part of my evaluation.   CBC: Recent Labs  Lab 03/10/19 0448 03/12/19 1714 03/13/19 0425 03/14/19 0425 03/15/19 0404  WBC 8.8 10.4 10.1 9.6 8.9  HGB 9.1* 9.0* 8.4* 8.5* 8.2*  HCT 31.7* 31.7* 30.0* 29.7* 28.1*  MCV 88.8 88.8 91.2 91.1 90.9  PLT 305 306 282 277 456   Basic Metabolic Panel: Recent Labs  Lab 03/08/19 1810  03/09/19 0418 03/10/19 0448 03/11/19 0416 03/12/19 0508 03/14/19 0425 03/15/19 0404  NA  --    < > 140 141 141 141 143 143    K  --    < > 3.9 3.6 4.0 3.8 4.3 4.1  CL  --    < > 102 101 104 105 106 106  CO2  --    < > 28 26 27 25 31 27   GLUCOSE  --    < > 99 130* 218* 239* 187* 222*  BUN  --    < > 43* 35* 41* 43* 46* 49*  CREATININE  --    < > 1.42* 1.21* 1.43* 1.35* 1.20* 1.23*  CALCIUM  --    < > 8.7* 8.8* 8.4* 8.4* 8.5* 8.3*  MG 2.5*  --  2.4 2.3  --  2.7* 2.9* 2.9*  PHOS 5.6*  --  5.7* 4.1 4.7* 3.4  --   --    < > = values in this interval not displayed.   GFR: Estimated Creatinine Clearance: 42.7 mL/min (A) (by C-G formula based on SCr of 1.23 mg/dL (H)). Liver Function Tests: Recent Labs  Lab 03/09/19 0418 03/10/19 0448 03/11/19 0416 03/12/19 0508  ALBUMIN 2.4* 2.4* 2.3* 2.4*   No results for input(s): LIPASE, AMYLASE in the last 168 hours. No results for input(s): AMMONIA in the last 168 hours. Coagulation Profile: No results for input(s): INR, PROTIME in the last 168 hours. Cardiac Enzymes: No results for input(s): CKTOTAL, CKMB, CKMBINDEX, TROPONINI in the last 168 hours. BNP (last 3 results) No results for input(s): PROBNP in the last 8760 hours. HbA1C: No results for input(s): HGBA1C in the last 72 hours. CBG: Recent Labs  Lab 03/14/19 1958 03/15/19 0020 03/15/19 0402 03/15/19 0757 03/15/19 1139  GLUCAP 200* 241* 209* 159* 159*   Lipid Profile: No results for input(s): CHOL, HDL, LDLCALC, TRIG, CHOLHDL, LDLDIRECT in the last 72 hours. Thyroid Function Tests: No results for input(s): TSH, T4TOTAL, FREET4, T3FREE, THYROIDAB in the last 72 hours. Anemia Panel: No results for input(s): VITAMINB12, FOLATE, FERRITIN, TIBC, IRON, RETICCTPCT in the last 72 hours. Urine analysis:    Component Value Date/Time   COLORURINE YELLOW 02/04/2019 0123   APPEARANCEUR CLEAR 02/04/2019 0123   LABSPEC 1.015 02/04/2019 0123   PHURINE 5.5 02/04/2019 0123   GLUCOSEU NEGATIVE 02/04/2019 0123   HGBUR NEGATIVE 02/04/2019 0123   BILIRUBINUR NEGATIVE 02/04/2019 0123   KETONESUR NEGATIVE 02/04/2019  0123   PROTEINUR NEGATIVE 02/04/2019 0123   NITRITE NEGATIVE 02/04/2019 0123  LEUKOCYTESUR NEGATIVE 02/04/2019 0123   Sepsis Labs: @LABRCNTIP (procalcitonin:4,lacticidven:4)  ) Recent Results (from the past 240 hour(s))  Culture, respiratory (non-expectorated)     Status: None   Collection Time: 03/12/19  9:25 AM  Result Value Ref Range Status   Specimen Description TRACHEAL ASPIRATE  Final   Special Requests NONE  Final   Gram Stain NO WBC SEEN RARE GRAM POSITIVE COCCI IN PAIRS   Final   Culture   Final    Consistent with normal respiratory flora. Performed at [facility], [place] [address] [place], [place] [address]    Report Status 03/14/2019 FINAL  Final         Radiology Studies: Dg Chest Port 1 View  Result Date: 03/15/2019 CLINICAL DATA:  73 year old female with chronic respiratory failure EXAM: PORTABLE CHEST 1 VIEW COMPARISON:  Prior chest x-ray 03/14/2019 FINDINGS: The tracheostomy tube remains in good position. The tip is midline and at the level of the clavicles. A feeding tube is present, the tip is not identified but lies off the field of view below the diaphragm and likely within the stomach or small bowel. A right upper extremity PICC is present. The catheter tip overlies the upper right atrium. Stable cardiomegaly. Atherosclerotic calcifications again noted in the transverse aorta. Pulmonary vascular congestion with diffuse mild interstitial prominence and peripheral Kerley B-lines consistent with mild interstitial edema. These findings have progressed compared to yesterday. Patchy left basilar airspace opacity partially obscures the hemidiaphragm. No pneumothorax. No acute osseous abnormality. IMPRESSION: 1. Slightly increased interstitial pulmonary edema. 2. Persistent left retrocardiac airspace opacity which may reflect atelectasis and/or infiltrate. 3. Support apparatus in stable and satisfactory position. Electronically Signed   By: Jacqulynn Cadet M.D.    On: 03/15/2019 09:16   Dg Chest Port 1 View  Result Date: 03/14/2019 CLINICAL DATA:  Respiratory failure. EXAM: PORTABLE CHEST 1 VIEW COMPARISON:  One-view chest x-ray 03/11/2019 FINDINGS: Heart size is exaggerated by low lung volumes. Atherosclerotic calcifications are present at the aortic arch. Tracheostomy tube is stable. Right-sided PICC line is in satisfactory position. A feeding tube courses off the inferior border of the film. Left greater than right basilar airspace disease is stable. There is slight increase in mild pulmonary vascular congestion. IMPRESSION: 1. Left greater than right basilar airspace disease is similar the prior study. While this may represent atelectasis, infection is not excluded. 2. Slight increase in pulmonary vascular congestion. 3. Support apparatus is stable. Electronically Signed   By: San Morelle M.D.   On: 03/14/2019 08:15        Scheduled Meds:  sodium chloride   Intravenous Once   amiodarone  200 mg Per Tube Daily   aspirin EC  81 mg Oral Daily   atorvastatin  40 mg Per Tube q1800   carvedilol  3.125 mg Per NG tube BID WC   chlorhexidine gluconate (MEDLINE KIT)  15 mL Mouth Rinse BID   Chlorhexidine Gluconate Cloth  6 each Topical Q0600   clonazepam  1 mg Per Tube QID   feeding supplement (GLUCERNA 1.5 CAL)  1,000 mL Per Tube Q24H   feeding supplement (PRO-STAT SUGAR FREE 64)  30 mL Per Tube Daily   furosemide  40 mg Intravenous Daily   hydrALAZINE  10 mg Per Tube Q8H   insulin aspart  0-24 Units Subcutaneous Q4H   insulin glargine  25 Units Subcutaneous QHS   isosorbide dinitrate  10 mg Per Tube TID   losartan  25 mg Per Tube Daily  mouth rinse  15 mL Mouth Rinse 10 times per day   multivitamin  15 mL Per Tube Daily   pantoprazole (PROTONIX) IV  40 mg Intravenous Q12H   polyethylene glycol  17 g Oral BID   sertraline  25 mg Per Tube Daily   sodium chloride flush  10-40 mL Intracatheter Q12H   spironolactone   12.5 mg Per Tube Daily   traZODone  50 mg Per Tube QHS   Continuous Infusions:  sodium chloride Stopped (03/04/19 0815)     LOS: 34 days   Bonnell Public, MD [facility] Pager [phone] If 7PM-7AM, please contact night-coverage [email or web address] Password [facility] 03/15/2019, 12:30 PM

## 2019-03-16 ENCOUNTER — Inpatient Hospital Stay (HOSPITAL_COMMUNITY): Payer: Medicaid Other

## 2019-03-16 LAB — GLUCOSE, CAPILLARY
Glucose-Capillary: 120 mg/dL — ABNORMAL HIGH (ref 70–99)
Glucose-Capillary: 165 mg/dL — ABNORMAL HIGH (ref 70–99)
Glucose-Capillary: 170 mg/dL — ABNORMAL HIGH (ref 70–99)
Glucose-Capillary: 175 mg/dL — ABNORMAL HIGH (ref 70–99)
Glucose-Capillary: 202 mg/dL — ABNORMAL HIGH (ref 70–99)
Glucose-Capillary: 235 mg/dL — ABNORMAL HIGH (ref 70–99)

## 2019-03-16 LAB — BASIC METABOLIC PANEL
Anion gap: 12 (ref 5–15)
BUN: 50 mg/dL — ABNORMAL HIGH (ref 8–23)
CO2: 27 mmol/L (ref 22–32)
Calcium: 8.3 mg/dL — ABNORMAL LOW (ref 8.9–10.3)
Chloride: 105 mmol/L (ref 98–111)
Creatinine, Ser: 1.29 mg/dL — ABNORMAL HIGH (ref 0.44–1.00)
GFR calc Af Amer: 48 mL/min — ABNORMAL LOW (ref >60)
GFR calc non Af Amer: 41 mL/min — ABNORMAL LOW (ref >60)
Glucose, Bld: 269 mg/dL — ABNORMAL HIGH (ref 70–99)
Potassium: 4.2 mmol/L (ref 3.5–5.1)
Sodium: 144 mmol/L (ref 135–145)

## 2019-03-16 LAB — CBC
HCT: 27.4 % — ABNORMAL LOW (ref 36.0–46.0)
Hemoglobin: 7.7 g/dL — ABNORMAL LOW (ref 12.0–15.0)
MCH: 25.8 pg — ABNORMAL LOW (ref 26.0–34.0)
MCHC: 28.1 g/dL — ABNORMAL LOW (ref 30.0–36.0)
MCV: 91.9 fL (ref 80.0–100.0)
Platelets: 260 10*3/uL (ref 150–400)
RBC: 2.98 MIL/uL — ABNORMAL LOW (ref 3.87–5.11)
RDW: 17.7 % — ABNORMAL HIGH (ref 11.5–15.5)
WBC: 8.3 10*3/uL (ref 4.0–10.5)
nRBC: 0 % (ref 0.0–0.2)

## 2019-03-16 LAB — MAGNESIUM: Magnesium: 2.9 mg/dL — ABNORMAL HIGH (ref 1.7–2.4)

## 2019-03-16 MED ORDER — PANCRELIPASE (LIP-PROT-AMYL) 10440-39150 UNITS PO TABS
20880.0000 [IU] | ORAL_TABLET | Freq: Once | ORAL | Status: AC
  Administered 2019-03-16: 16:00:00 20880 [IU]
  Filled 2019-03-16: qty 2

## 2019-03-16 MED ORDER — COCONUT OIL OIL
1.0000 "application " | TOPICAL_OIL | Status: DC | PRN
  Administered 2019-03-16 – 2019-03-18 (×3): 1 via TOPICAL
  Filled 2019-03-16 (×2): qty 120

## 2019-03-16 MED ORDER — BENZOCAINE 20 % MT AERO
INHALATION_SPRAY | Freq: Once | OROMUCOSAL | Status: AC
  Administered 2019-03-16: 17:00:00 via OROMUCOSAL
  Filled 2019-03-16: qty 57

## 2019-03-16 MED ORDER — SODIUM BICARBONATE 650 MG PO TABS
650.0000 mg | ORAL_TABLET | Freq: Once | ORAL | Status: AC
  Administered 2019-03-16: 16:00:00 650 mg
  Filled 2019-03-16: qty 1

## 2019-03-16 NOTE — Progress Notes (Signed)
PROGRESS NOTE    Gabrielle Wright  YQI:[id number] DOB: March 15, 1946 DOA: 02/03/2019 PCP: Rudene Anda, MD   Brief Narrative:  73 year old female PMHx from [place]. HTN , CAD, DM Type 2?  Noted to have a high fever 103.9. Reported to have cough. Taken to [place] [facility] per family. Was supported to have a urinary tract infection although her urine was clean. She never filled her prescription for antibiotics. Due to persistent hypotension and refractory nature of her hypertension she is transferred to [facility] 02/04/2019. She was in respiratory distress for abdominal chest wall paradoxus. She required intubation and central line placement. She had maxed out on peripheral IV vasopressors therefore central line was placed. Pulmonary critical care will be responsible for care at this time. She was placed in isolation since she came from a [place] entry of [place] with recent reports of coronavirus and that country. Note [place] is not on the list of infected countries. Infectious disease was called and they reported again diagnosis done on 1 reported countries.   03/15/2019: Patient seen alongside patient's nurse.  Also discussed with the patient's nurse extensively.  Lab work revealed.  Nursing staff is worried that patient is significantly anxious.  Patient is already on benzodiazepines, as well as SSRI and TCA.  Will minimize, and optimize mind on current medications.  Patient reports headache.  Available records reviewed.  03/16/2019: Patient seen.  Patient remains stable.  Patient is not particularly keen on trying trach collar.  Patient encouraged.  Otherwise, no new changes.  Subjective: No shortness of breath  No fever or chills  No chest pain.    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 - Continue weaning today.  Have increased anxiety medication, which I believe is a large part of patient not being able to get down to 28% FiO2 - See ischemic cardiomyopathy 03/15/2019: Continue current management. 03/16/2019: Continue weaning trial if patient will be willing to participate.  Ischemic cardiomyopathy-acute on chronic systolic and diastolic CHF - 3/87 echocardiogram EF 35 to 40%, LV diffuse hypokinesis, no targets for revascularization on catheterization - Cardiology following  -4/16 CVP? -Strict in and out +5.8 L -Daily weight Filed Weights   03/14/19 0426 03/15/19 0500 03/16/19 0500  Weight: 76 kg 74.5 kg 75.3 kg  -Amiodarone 200 mg daily -Coreg 3.125 mg twice daily -Furosemide 40 mg daily -Plavix 75 mg daily (hold) -Hydralazine 10 mg 3 times daily -Isosorbide dinitrate 10 mg 3 times daily -Spironolactone 12.5 mg daily -4/9 transfuse 1 unit PRBC -Transfuse for hemoglobin<8 per AHA guidelines.  Unfortunately unless patient in distress may not be able to transfuse until hemoglobin<7 secondary to blood shortage. 03/15/2019: Seems stable.  Continue current management.  A. fib with RVR - See ischemic cardiomyopathy  Hypernatremia - Resolved  Diabetes type 2 uncontrolled with complication - 5/64 hemoglobin A1c= 10.1 - 4/16 increase Lantus 20 units  daily - Custom SSI 03/16/2019: Continue to optimize.   Agitation/Anxiety -Exacerbated by language barrier.  National language? - Clonazepam 1 mg QID -Lorazepam 0.5 mg PRN  -Zoloft 25 mg daily -During the day all shades and lights on 03/16/2019: Continue current medication.    Insomnia  -Trazodone 50 mg daily  Dysphasia - Feedings per core track on hold secondary to abdominal pain. - 4/16 consult for swallow evaluation   GI bleed Recent Labs  Lab 03/12/19 1714 03/13/19 0425 03/14/19 0425 03/15/19 0404 03/16/19 0353  HGB 9.0* 8.4* 8.5* 8.2* 7.7*  -Resolved patient's hemoglobin is stable  DVT prophylaxis: S CD Code Status: Full Family Communication: None Disposition Plan: TBD   Consultants:  Cardiology PCCM    Procedures/Significant Events:  4/8 acute abdominal series: Negative ileus or SBO 4/9 PCXR: Unchanged layering pleural effusion and pulmonary opacification     I have personally reviewed and interpreted all radiology studies and my findings are as above.  VENTILATOR SETTINGS: Mode: PRVC (at night) Vt set: 450 Set rate: 12 FiO2 30% PEEP: 5  (Daytime) TC FiO2: 35% SPO2: 100%   Cultures   Antimicrobials: Anti-infectives (From admission, onward)   Start     Ordered Stop   02/19/19 1200  ciprofloxacin (CIPRO) IVPB 400 mg     02/19/19 1119 02/25/19 1321   02/18/19 1230  ciprofloxacin (CIPRO) IVPB 400 mg  Status:  Discontinued     02/18/19 1127 02/19/19 1119   02/14/19 0900  cefTRIAXone (ROCEPHIN) 2 g in sodium chloride 0.9 % 100 mL IVPB     02/14/19 0846 02/16/19 1033   02/06/19 1900  penicillin G potassium 8 Million Units in dextrose 5 % 500 mL continuous infusion  Status:  Discontinued     02/06/19 1900 02/14/19 0846   02/06/19 1100  penicillin G potassium 8 Million Units in dextrose 5 % 500 mL continuous infusion  Status:  Discontinued     02/06/19 1008 02/06/19 1900   02/05/19 2300  penicillin G potassium 4 Million Units in dextrose 5 % 250 mL IVPB  Status:  Discontinued     02/04/19 2216 02/04/19 2237   02/04/19 2300  vancomycin (VANCOCIN) 1,500 mg in sodium chloride 0.9 % 500 mL IVPB     02/04/19 2216  02/05/19 0308   02/04/19 2300  penicillin G potassium 4 Million Units in dextrose 5 % 250 mL IVPB  Status:  Discontinued     02/04/19 2237 02/06/19 1008   02/04/19 2230  clindamycin (CLEOCIN) IVPB 600 mg  Status:  Discontinued     02/04/19 2216 02/05/19 0917   02/04/19 0315  piperacillin-tazobactam (ZOSYN) IVPB 3.375 g     02/04/19 0303 02/04/19 0408   02/04/19 0315  vancomycin (VANCOCIN) IVPB 1000 mg/200 mL premix     02/04/19 0303 02/04/19 0424        Devices    LINES / TUBES:  6 mm cuffed trach 3/31>>    Continuous Infusions: . sodium chloride Stopped (03/04/19 0815)     Objective: Vitals:   03/16/19 1100 03/16/19 1125 03/16/19 1200 03/16/19 1201  BP: (!) 119/56 (!) 119/56 127/61   Pulse: 80 78 88 85  Resp: 19 14 (!) 26 16  Temp:   98.7 F (37.1 C)   TempSrc:   Oral   SpO2: 100% 100% 100% 100%  Weight:      Height:        Intake/Output Summary (Last 24 hours) at 03/16/2019 1513 Last data filed at 03/16/2019 1338 Gross  per 24 hour  Intake 850 ml  Output 1351 ml  Net -501 ml   Filed Weights   03/14/19 0426 03/15/19 0500 03/16/19 0500  Weight: 76 kg 74.5 kg 75.3 kg   General: Awake and alert.  Not in any acute respiratory distress. HEENT: Pallor.  No jaundice.   Neck: Supple.  No JVD.  Trach in situ.  Lungs: Clear to auscultation. Cardiovascular: S1-S2.   Abdomen: Soft and nontender.  Organs are difficult to assess.   Extremities: No leg edema. Central nervous system: Awake and alert.  Patient moves all extremities.  Data Reviewed: Care during the described time interval was provided by me .  I have reviewed this patient's available data, including medical history, events of note, physical examination, and all test results as part of my evaluation.   CBC: Recent Labs  Lab 03/12/19 1714 03/13/19 0425 03/14/19 0425 03/15/19 0404 03/16/19 0353  WBC 10.4 10.1 9.6 8.9 8.3  HGB 9.0* 8.4* 8.5* 8.2* 7.7*  HCT 31.7* 30.0* 29.7* 28.1* 27.4*  MCV 88.8 91.2  91.1 90.9 91.9  PLT 306 282 277 273 224   Basic Metabolic Panel: Recent Labs  Lab 03/10/19 0448 03/11/19 0416 03/12/19 0508 03/14/19 0425 03/15/19 0404 03/16/19 0353  NA 141 141 141 143 143 144  K 3.6 4.0 3.8 4.3 4.1 4.2  CL 101 104 105 106 106 105  CO2 _0 GLUCOSE 130* 218* 239* 187* 222* 269*  BUN 35* 41* 43* 46* 49* 50*  CREATININE 1.21* 1.43* 1.35* 1.20* 1.23* 1.29*  CALCIUM 8.8* 8.4* 8.4* 8.5* 8.3* 8.3*  MG 2.3  --  2.7* 2.9* 2.9* 2.9*  PHOS 4.1 4.7* 3.4  --   --   --    GFR: Estimated Creatinine Clearance: 40.9 mL/min (A) (by C-G formula based on SCr of 1.29 mg/dL (H)). Liver Function Tests: Recent Labs  Lab 03/10/19 0448 03/11/19 0416 03/12/19 0508  ALBUMIN 2.4* 2.3* 2.4*   No results for input(s): LIPASE, AMYLASE in the last 168 hours. No results for input(s): AMMONIA in the last 168 hours. Coagulation Profile: No results for input(s): INR, PROTIME in the last 168 hours. Cardiac Enzymes: No results for input(s): CKTOTAL, CKMB, CKMBINDEX, TROPONINI in the last 168 hours. BNP (last 3 results) No results for input(s): PROBNP in the last 8760 hours. HbA1C: No results for input(s): HGBA1C in the last 72 hours. CBG: Recent Labs  Lab 03/15/19 1952 03/15/19 2328 03/16/19 0337 03/16/19 0750 03/16/19 1126  GLUCAP 197* 185* 202* 170* 175*   Lipid Profile: No results for input(s): CHOL, HDL, LDLCALC, TRIG, CHOLHDL, LDLDIRECT in the last 72 hours. Thyroid Function Tests: No results for input(s): TSH, T4TOTAL, FREET4, T3FREE, THYROIDAB in the last 72 hours. Anemia Panel: No results for input(s): VITAMINB12, FOLATE, FERRITIN, TIBC, IRON, RETICCTPCT in the last 72 hours. Urine analysis:    Component Value Date/Time   COLORURINE YELLOW 02/04/2019 0123   APPEARANCEUR CLEAR 02/04/2019 0123   LABSPEC 1.015 02/04/2019 0123   PHURINE 5.5 02/04/2019 0123   GLUCOSEU NEGATIVE 02/04/2019 0123   HGBUR NEGATIVE 02/04/2019 0123   BILIRUBINUR NEGATIVE  02/04/2019 0123   KETONESUR NEGATIVE 02/04/2019 0123   PROTEINUR NEGATIVE 02/04/2019 0123   NITRITE NEGATIVE 02/04/2019 0123   LEUKOCYTESUR NEGATIVE 02/04/2019 0123   Sepsis Labs: _1 (procalcitonin:4,lacticidven:4)  ) Recent Results (from the past 240 hour(s))  Culture, respiratory (non-expectorated)     Status: None   Collection Time: 03/12/19  9:25 AM  Result Value Ref  Range Status   Specimen Description TRACHEAL ASPIRATE  Final   Special Requests NONE  Final   Gram Stain NO WBC SEEN RARE GRAM POSITIVE COCCI IN PAIRS   Final   Culture   Final    Consistent with normal respiratory flora. Performed at [facility], [place] [address], [place], [place] [address]    Report Status 03/14/2019 FINAL  Final  Culture, respiratory (non-expectorated)     Status: None (Preliminary result)   Collection Time: 03/15/19  9:00 AM  Result Value Ref Range Status   Specimen Description TRACHEAL ASPIRATE  Final   Special Requests NONE  Final   Gram Stain   Final    RARE WBC PRESENT, PREDOMINANTLY PMN FEW GRAM POSITIVE RODS FEW GRAM POSITIVE COCCI IN PAIRS RARE GRAM NEGATIVE RODS    Culture   Final    CULTURE REINCUBATED FOR BETTER GROWTH Performed at [facility], [place] [address], [place], [place] [address]    Report Status PENDING  Incomplete         Radiology Studies: Dg Abd 1 View  Result Date: 03/16/2019 CLINICAL DATA:  Feeding tube placement. EXAM: ABDOMEN - 1 VIEW COMPARISON:  None. FINDINGS: A small bore feeding tube is identified with tip overlying the distal stomach. The visualized portions of the feeding tube are unremarkable. IMPRESSION: Small bore feeding tube with tip overlying the distal stomach. No abnormalities of the visualized feeding tube identified. Electronically Signed   By: Margarette Canada M.D.   On: 03/16/2019 14:22   Dg Chest Port 1 View  Result Date: 03/15/2019 CLINICAL DATA:  73 year old female with chronic respiratory failure EXAM: PORTABLE  CHEST 1 VIEW COMPARISON:  Prior chest x-ray 03/14/2019 FINDINGS: The tracheostomy tube remains in good position. The tip is midline and at the level of the clavicles. A feeding tube is present, the tip is not identified but lies off the field of view below the diaphragm and likely within the stomach or small bowel. A right upper extremity PICC is present. The catheter tip overlies the upper right atrium. Stable cardiomegaly. Atherosclerotic calcifications again noted in the transverse aorta. Pulmonary vascular congestion with diffuse mild interstitial prominence and peripheral Kerley B-lines consistent with mild interstitial edema. These findings have progressed compared to yesterday. Patchy left basilar airspace opacity partially obscures the hemidiaphragm. No pneumothorax. No acute osseous abnormality. IMPRESSION: 1. Slightly increased interstitial pulmonary edema. 2. Persistent left retrocardiac airspace opacity which may reflect atelectasis and/or infiltrate. 3. Support apparatus in stable and satisfactory position. Electronically Signed   By: Jacqulynn Cadet M.D.   On: 03/15/2019 09:16        Scheduled Meds: . sodium chloride   Intravenous Once  . amiodarone  200 mg Per Tube Daily  . aspirin EC  81 mg Oral Daily  . atorvastatin  40 mg Per Tube q1800  . carvedilol  3.125 mg Per NG tube BID WC  . chlorhexidine gluconate (MEDLINE KIT)  15 mL Mouth Rinse BID  . Chlorhexidine Gluconate Cloth  6 each Topical Q0600  . clonazepam  1 mg Per Tube QID  . feeding supplement (GLUCERNA 1.5 CAL)  1,000 mL Per Tube Q24H  . feeding supplement (PRO-STAT SUGAR FREE 64)  30 mL Per Tube Daily  . furosemide  40 mg Intravenous Daily  . hydrALAZINE  10 mg Per Tube Q8H  . insulin aspart  0-24 Units Subcutaneous Q4H  . insulin glargine  25 Units Subcutaneous QHS  . isosorbide dinitrate  10  mg Per Tube TID  . losartan  25 mg Per Tube Daily  . mouth rinse  15 mL Mouth Rinse 10 times per day  . montelukast  10 mg  Oral QHS  . multivitamin  15 mL Per Tube Daily  . pantoprazole (PROTONIX) IV  40 mg Intravenous Q12H  . polyethylene glycol  17 g Oral BID  . sertraline  50 mg Per Tube Daily  . sodium chloride flush  10-40 mL Intracatheter Q12H  . spironolactone  12.5 mg Per Tube Daily  . traZODone  50 mg Per Tube QHS   Continuous Infusions: . sodium chloride Stopped (03/04/19 0815)     LOS: 4 days   Bonnell Public, MD [facility] Pager [phone] If 7PM-7AM, please contact night-coverage [email or web address] Password [facility] 03/16/2019, 3:13 PM

## 2019-03-16 NOTE — Progress Notes (Addendum)
Assisted tele visit to patient with daughter. During this call daughter was encouraged not to copy and paste the link which she is sending to other family members who are located in another country because it is causing them to freeze and makes things challenging to communicate with the multiple people. I have encouraged her to provide email addresses so we can improve the call with everyone having their own link to possible prevent so much freezing and competing to talking Redmond Pulling, RN

## 2019-03-16 NOTE — Progress Notes (Signed)
Patient's cortrak remains clogged despite following unclogging protocol orders. MD notified of interventions and ordered NGT placement. NG tube placed with no complications, patients vitals stable throughout procedure. O2 sat 100%. Patient resting comfortably and will continue to monitor.

## 2019-03-16 NOTE — Plan of Care (Signed)
  Problem: Clinical Measurements: Goal: Will remain free from infection Outcome: Progressing   Problem: Clinical Measurements: Goal: Respiratory complications will improve Outcome: Progressing   Problem: Clinical Measurements: Goal: Cardiovascular complication will be avoided Outcome: Progressing   Problem: Nutrition: Goal: Adequate nutrition will be maintained Outcome: Progressing   Problem: Coping: Goal: Level of anxiety will decrease Outcome: Progressing   Problem: Respiratory: Goal: Ability to maintain a clear airway and adequate ventilation will improve Outcome: Progressing

## 2019-03-17 ENCOUNTER — Inpatient Hospital Stay (HOSPITAL_COMMUNITY): Payer: Medicaid Other

## 2019-03-17 DIAGNOSIS — J9611 Chronic respiratory failure with hypoxia: Secondary | ICD-10-CM

## 2019-03-17 LAB — CBC
HCT: 28.5 % — ABNORMAL LOW (ref 36.0–46.0)
Hemoglobin: 7.9 g/dL — ABNORMAL LOW (ref 12.0–15.0)
MCH: 25.1 pg — ABNORMAL LOW (ref 26.0–34.0)
MCHC: 27.7 g/dL — ABNORMAL LOW (ref 30.0–36.0)
MCV: 90.5 fL (ref 80.0–100.0)
Platelets: 267 10*3/uL (ref 150–400)
RBC: 3.15 MIL/uL — ABNORMAL LOW (ref 3.87–5.11)
RDW: 17.5 % — ABNORMAL HIGH (ref 11.5–15.5)
WBC: 7.7 10*3/uL (ref 4.0–10.5)
nRBC: 0 % (ref 0.0–0.2)

## 2019-03-17 LAB — BASIC METABOLIC PANEL
Anion gap: 7 (ref 5–15)
BUN: 43 mg/dL — ABNORMAL HIGH (ref 8–23)
CO2: 33 mmol/L — ABNORMAL HIGH (ref 22–32)
Calcium: 8.6 mg/dL — ABNORMAL LOW (ref 8.9–10.3)
Chloride: 106 mmol/L (ref 98–111)
Creatinine, Ser: 1.17 mg/dL — ABNORMAL HIGH (ref 0.44–1.00)
GFR calc Af Amer: 54 mL/min — ABNORMAL LOW (ref >60)
GFR calc non Af Amer: 47 mL/min — ABNORMAL LOW (ref >60)
Glucose, Bld: 148 mg/dL — ABNORMAL HIGH (ref 70–99)
Potassium: 4 mmol/L (ref 3.5–5.1)
Sodium: 146 mmol/L — ABNORMAL HIGH (ref 135–145)

## 2019-03-17 LAB — GLUCOSE, CAPILLARY
Glucose-Capillary: 96 mg/dL (ref 70–99)
Glucose-Capillary: 146 mg/dL — ABNORMAL HIGH (ref 70–99)
Glucose-Capillary: 201 mg/dL — ABNORMAL HIGH (ref 70–99)
Glucose-Capillary: 164 mg/dL — ABNORMAL HIGH (ref 70–99)
Glucose-Capillary: 248 mg/dL — ABNORMAL HIGH (ref 70–99)

## 2019-03-17 LAB — CULTURE, RESPIRATORY: Culture: NORMAL

## 2019-03-17 LAB — CULTURE, RESPIRATORY W GRAM STAIN

## 2019-03-17 LAB — MAGNESIUM: Magnesium: 2.7 mg/dL — ABNORMAL HIGH (ref 1.7–2.4)

## 2019-03-17 MED ORDER — ASPIRIN 81 MG PO CHEW
81.0000 mg | CHEWABLE_TABLET | Freq: Every day | ORAL | Status: DC
  Administered 2019-03-17 – 2019-03-21 (×5): 81 mg
  Filled 2019-03-17 (×5): qty 1

## 2019-03-17 MED ORDER — LORAZEPAM 2 MG/ML IJ SOLN
1.0000 mg | INTRAMUSCULAR | Status: DC | PRN
  Administered 2019-03-17 – 2019-03-20 (×5): 1 mg via INTRAVENOUS
  Filled 2019-03-17 (×6): qty 1

## 2019-03-17 NOTE — Progress Notes (Addendum)
NAME:  Tajana Mundle, MRN:  [id number], DOB:  [birth year], LOS: 41 ADMISSION DATE:  02/03/2019, CONSULTATION DATE: 02/04/2019 REFERRING MD: Emergency department physician CHIEF COMPLAINT: Fever respiratory failure  Brief History   73 year old elderly woman visiting from [place], admitted 3/10 with fevers, hypotension, found to have group A strep bacteremia, required mechanical ventilation Course complicated by non-STEMI and new LV dysfunction ? Ischemic vs Takatsubo's Failed extubation 3/18 due to acute pulmonary edema. S/p trach 3/30.  Past Medical History  Hypertension Coronary artery disease Suspected diabetes.  Significant Hospital Is Events   02/04/2019 transfer from [facility] to [facility] required intubation and pressors. 3/16 off levophed 3/18 extubated briefly but reintubated due to pulmonary edema 3/19 started milrinone 3/20 Episode of respiratory distress,  Precedex changed to propofol Atrial fibrillation RVR >>amiodarone bolus >> sinus rhythm 3/21 rising creat , AF-RVR >> amio bolus 3/24:rising creatinine 3/30: reintubated 3/31 trach 4/1 PICC  Consults:  02/04/2019 ID  IR  Cardiology  Procedures:  ETT 3/10 > 3/18, 3/18 > 3/31 Trach 3/31 > CVL 3/10 > 4/3 PICC 4/1 >   Significant Diagnostic Tests:  Echo 3/15 decreased EF 35 to 40%, LVEDP 27, no significant MR Cardiac cath >> LVEDP 40, RCA totally occluded, patent OM1 stent but distally occluded, apical LAD diffusely diseased, no intervention Chest x-ray 3/22  bilateral lower lobe airspace disease versus layering effusions CXR 3/23> pulmonary edema and bilateral pleural effusions. Echo 3/26 limited> EF 35-40%, elevated LA and LV end-diastolic pressures, Diffuse hypokinesis of LV CXR 4/1> bibasilar opacities, small R pleural effusion, tracheostomy tube present and appropriately positioned  CXR 4/9> bilateral infiltrates with R pleural effusion CXR 4/14> possible left sided infiltrate with a small  effusion, right sided atelectasis CXR 4/7> L>R airspace disease, atelectasis vs pna; inc congestion  Micro Data:  02/04/2019 blood cultures x2>> Gr A strep 02/04/2019 sputum culture>> neg 02/04/2019 urine culture>>neg 02/04/2019 respiratory virus panel>> rhinovirus 02/04/2019 flu a and B>> neg coronavirus testing 02/04/2019>>neg resp 3/21 >> Klebsiella oxytoca, co-ag negative staph Resp 4/15 and 4/18>> neg  Antimicrobials:  02/04/2019 vancomycin>>3/ 10 02/04/2019 Zosyn>> 3/10 Pen G 3/10 >> 3/22 (plan) Ceftx 3/20 >>3/24 Cipro 3/24>> 3/31  Interim history/subjective:  Tolerated trach collar for about 7 hours yesterday; trial of overnight trach collar was not successful. RN reports good urine output with lasix  Objective   Blood pressure (!) 106/55, pulse 70, temperature 98.5 F (36.9 C), temperature source Oral, resp. rate 19, height 5\' 6"  (1.676 m), weight 73.9 kg, SpO2 100 %.    Vent Mode: PRVC FiO2 (%):  [30 %-35 %] 30 % Set Rate:  [12 bmp] 12 bmp Vt Set:  [450 mL] 450 mL PEEP:  [5 cmH20] 5 cmH20 Plateau Pressure:  [20 cmH20] 20 cmH20   Intake/Output Summary (Last 24 hours) at 03/17/2019 0902 Last data filed at 03/17/2019 0500 Gross per 24 hour  Intake 160 ml  Output 1450 ml  Net -1290 ml   Filed Weights   03/15/19 0500 03/16/19 0500 03/17/19 0336  Weight: 74.5 kg 75.3 kg 73.9 kg   Examination:  General: adult female on vent Neuro: alert, awake, follows commands HEENT: [place]/AT. No JVD. Trach in place.  Cardiovascular: RRR, no MRG.  Lungs: diffuse rhonchi, no increased work of breathing. Abdomen: soft, distended, non tender  Musculoskeletal: No gross deformities, no edema.  Skin: Grossly intact.   Assessment & Plan:   Acute respiratory failure requiring mechanical ventilation - s/p trach 3/31. Weaned on trach collar for  several hours yesterday.  -trach collar again today and increase progressively time progressively as tolerated -guaifenesin prn, chest physiotherapy  -CXR 4/14 revealed possible left sided pna - resp culture 4/15 and 4/18 consistent with normal respiratory flora -daily lasix  Ischemic Cardiomyopathy (Echo 3/26 EF 35-40%, LV diffuse hypokinesis) - no targets for revascularization on cath. Due to borderline BPs, she has had to have coreg, hydral, isosorbide held for at least one dose - Continue coreg, hydralazine, imdur, spironolactone, and losartan, asa; started on losartan and spironolactone per cards; consider narrowing therapy based on BP.  - Holding plavix 2/2 Hgb drop and ?bloody NG output last week; since then Hgb stable and no signs of bleeding - on scheduled IV lasix daily  Anemia: Report of mild bloody NG output and Hgb drop last week from 8.1>7.3; s/p 1u pRBC per primary. Stable Hgb since then and no signs of bleeding. ASA restarted but still holding plavix, per primary and cards.   Atrial Fibrillation with RVR - resolved - Continue amio, coreg. No anticoagulation per cards as thought to be 2/2 acute illness and sepsis.   Diabetes type 2/uncontrolled hyperglycemia. - Lantus, SSI  Intermittent agitation Exacerbated by language barrier (family video conference has seemed to help).   - per primary   Dysphagia:  Cortrak had to be replaced with NG tube yesterday due to clogging. May need consideration for PEG placement if not continuing to make progress on trach weaning  CM on board for LTAC/SNF options.   GI ppx: Protonix VAP: per protocol Pain/Sedation: PRN clonazepam DVT ppx: SCDs  Diet: Enteral Nutrition Code Status: full   Nyra Market, MD PGY3 Pager [phone]  03/17/2019 9:02 AM   ___________________________________________________________________________  PCCM Attending:   73 yo FM chronic resp failure, s/p tracheostomy. Doing well for trach follow up. SLP planned to see today. Patient was asking for water. I was able to speak with her with translator through Ipad.  BP 133/62   Pulse 76   Temp 97.7 F  (36.5 C) (Oral)   Resp 17   Ht 5\' 6"  (1.676 m)   Wt 73.9 kg   SpO2 100%   BMI 26.30 kg/m   Gen: elderly fm, no distress, was walking up in hallway and was doing well up in chair  Heart: RRR, s1 s2, no mrg  Lungs: CTAB Neck: trach in place, CDI   A; Trachesotomy in place Chronic hypoxemic respiratory failure  Opacity on CXR, but no fevers, no infectious symptoms at this time - should follow this up, if fevers low threshold to reculture and start abx   P:  Weaning from TCT as tolerated.  Still requiring vent at night Agree with SLP eval and hopefully work towards swallow function improvement.  Repeat cxr today  Continue diuresis to maintain euvolemia   [name], DO [facility] 03/17/2019 2:50 PM

## 2019-03-17 NOTE — Progress Notes (Signed)
Assisted tele visit to patient with daughter.  Daylyn Christine M Shaneeka Scarboro, RN   

## 2019-03-17 NOTE — Progress Notes (Signed)
Physical Therapy Treatment Patient Details Name: Gabrielle Wright MRN: [id number] DOB: 01-15-1946 Today's Date: 03/17/2019    History of Present Illness 73 y.o. female admitted on 02/03/19 for fever and cough (to [facility]) and was transferred to [facility] on 02/04/19 for hypotension and respiratory distress (acute respiratory failure/pulmonary edema) requiring intubation 3/10-current (time of PT eval 02/27/2019).  She was visiting family from [place] and [place] tests were negative.  She was found to have group A strep bacteremia (septic shiock), rhinovirus,  and course complicated by NSTEMI and new LV dysfunction s/p cardiac cath on 02/09/19.  Cardiology following.  Other dx include ischemic cardiomyopathy, hyperkalemia, DM2- uncontrolled, and abdominal distention.  Pt with significant PMH of DM, HTN, anc CAD. Trach placed on 3/31    PT Comments    Pt admitted with above diagnosis. Pt currently with functional limitations due to balance and endurance deficits. Pt was able to ambulate with RW 10 feet (then 5 feet and then 5 more feet)  with mod assist of 2 with 3rd person for close chair follow as pt sits abruptly.  Needed several sitting rest breaks due to fatigues quickly.  Needs max encouragement to participate.  MEt 2/5 goals.  Goals were revised today.  Pt will benefit from skilled PT to increase their independence and safety with mobility to allow discharge to the venue listed below.     Follow Up Recommendations  SNF     Equipment Recommendations  Other (comment)(TBA)    Recommendations for Other Services Rehab consult     Precautions / Restrictions Precautions Precautions: Fall Precaution Comments: trach, vent at night Restrictions Weight Bearing Restrictions: No    Mobility  Bed Mobility Overal bed mobility: Needs Assistance Bed Mobility: Rolling;Sidelying to Sit Rolling: Mod assist Sidelying to sit: Mod assist Supine to sit: Mod assist;HOB elevated     General bed  mobility comments: cued for the roll with visual cues and assisted slowly to give pt time to assist with L UE.   Transfers Overall transfer level: Needs assistance Equipment used: Rolling walker (2 wheeled) Transfers: Sit to/from [facility] Sit to Stand: Mod assist;+2 physical assistance;From elevated surface;+2 safety/equipment         General transfer comment: pt able to attain full upright stance and with sufficient truncal support however fatigues quickly and sits without warning.  Needs constant cues to stand up tall and not so sit down too soon.    Ambulation/Gait Ambulation/Gait assistance: Mod assist;+2 physical assistance;+2 safety/equipment(NT - pt very fatigued with transfer to [facility] from bed) Gait Distance (Feet): 20 Feet(10 feet + 5 feet + 5 feet) Assistive device: Rolling walker (2 wheeled) Gait Pattern/deviations: Step-to pattern;Decreased stride length;Decreased step length - right;Decreased step length - left;Shuffle;Trunk flexed;Narrow base of support;Drifts right/left;Leaning posteriorly;Staggering left;Staggering right   Gait velocity interpretation: <1.31 ft/sec, indicative of household ambulator General Gait Details: short tentative unsteady steps with RW support with pt needed cues to stand tall.  Fatigues quickly with pt leaning forward and posterior lean as she fatigues and had to have chair close so that she could sit as once she decides to sit, chair must be close as she sits abruptly.  Needed max encouragement to walk 10 feet then 5 feet then 5 more feet.  Needed +2 mod assist for stability and 3rd person following with chair with actual 4th person pushing the equipment.     Stairs             Emergency planning/management officer  Modified Rankin (Stroke Patients Only)       Balance Overall balance assessment: Needs assistance Sitting-balance support: Feet supported;No upper extremity supported Sitting balance-Leahy Scale: Fair Sitting balance -  Comments: Can sit EOB without UEs upport for breif time   Standing balance support: Bilateral upper extremity supported;During functional activity Standing balance-Leahy Scale: Poor Standing balance comment: reliant on RW for support in standing and +2 mod assist as LEs fatigue quickly.                              Cognition Arousal/Alertness: Awake/alert Behavior During Therapy: WFL for tasks assessed/performed Overall Cognitive Status: Difficult to assess                                 General Comments: Used interpreter [employer]      Exercises General Exercises - Lower Extremity Ankle Circles/Pumps: Both;Supine;AROM;15 reps Long Arc Quad: AROM;Both;Seated;10 reps    General Comments        Pertinent Vitals/Pain Pain Assessment: Faces Faces Pain Scale: Hurts little more Pain Location: pt anxious, unclear of all sources of pain, HA per pt Pain Descriptors / Indicators: Restless Pain Intervention(s): Limited activity within patient's tolerance;Monitored during session;Repositioned;Patient requesting pain meds-RN notified    Home Living                      Prior Function            PT Goals (current goals can now be found in the care plan section) Acute Rehab PT Goals Patient Stated Goal: not stated today PT Goal Formulation: Patient unable to participate in goal setting Time For Goal Achievement: 03/31/19 Potential to Achieve Goals: Fair Progress towards PT goals: Progressing toward goals    Frequency    Min 2X/week      PT Plan Current plan remains appropriate    Co-evaluation              AM-PAC PT "6 Clicks" Mobility   Outcome Measure  Help needed turning from your back to your side while in a flat bed without using bedrails?: A Lot Help needed moving from lying on your back to sitting on the side of a flat bed without using bedrails?: A Lot Help needed moving to and from a bed to a chair (including a  wheelchair)?: A Lot Help needed standing up from a chair using your arms (e.g., wheelchair or bedside chair)?: A Lot Help needed to walk in hospital room?: A Lot Help needed climbing 3-5 steps with a railing? : Total 6 Click Score: 11    End of Session Equipment Utilized During Treatment: Oxygen;Gait belt Activity Tolerance: Patient limited by fatigue;Patient tolerated treatment well Patient left: with call bell/phone within reach;in chair;with nursing/sitter in room(chair alarm not available nursing aware) Nurse Communication: Mobility status PT Visit Diagnosis: Muscle weakness (generalized) (M62.81);Other abnormalities of gait and mobility (R26.89)     Time: 7106-2694 PT Time Calculation (min) (ACUTE ONLY): 30 min  Charges:  $Gait Training: 8-22 mins $Therapeutic Exercise: 8-22 mins                     [place] Pager:  [phone]  Office:  [place] 03/17/2019, 10:03 AM

## 2019-03-17 NOTE — Progress Notes (Signed)
  Speech Language Pathology Treatment: [name] Speaking valve  Patient Details Name: Gabrielle Wright MRN: [id number] DOB: 10/27/1946 Today's Date: 03/17/2019 Time: 8891-6945 SLP Time Calculation (min) (ACUTE ONLY): 18 min  Assessment / Plan / Recommendation Clinical Impression  Pt wore her PMV for 20 minutes today with intermittent subjective reports of difficulty breathing, but without objective data to indicate any distress. VS remained stable and no air trapping was observed. Her voice was soft, hoarse, and intermittently wet, but she did not cough or clear her throat to command, stating that she couldn't because of her trach. She also said that she couldn't talk because of her trach, but of course she was talking as she said this. At times her responses were very appropriate and at times she was repetitive and/or tangential. Teleinterpreter Janyth Contes [phone]) was able to translate, but appeared to be straining to hear her at times. Recommend trying to gradually increase PMV use - using it for shorter intervals (up to ~20 minutes or so) when full staff supervision can be provided. SLP will continue to follow.   HPI HPI: 73 y.o. female admitted on 02/03/19 for fever and cough ([facility]) and was transferred to [facility] on 02/04/19 for hypotension and respiratory distress (acute respiratory failure/pulmonary edema) requiring intubation 3/10-current (time of PT eval 02/27/2019).  She was visiting family from [place] and [place] 19 tests were negative.  She was found to have group A strep bacteremia (septic shiock), rhinovirus,  and course complicated by NSTEMI and new LV dysfunction s/p cardiac cath on 02/09/19.  Cardiology following.  Other dx include ischemic cardiomyopathy, hyperkalemia, DM2- uncontrolled, and abdominal distention.  Pt with significant PMH of DM, HTN, anc CAD. Trach placed on 3/31      SLP Plan  Continue with current plan of care       Recommendations         Patient may  use Passy-Muir Speech Valve: Intermittently with supervision PMSV Supervision: Full         Oral Care Recommendations: Oral care QID Follow up Recommendations: Skilled Nursing facility 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.1) Plan: Continue with current plan of care       GO                [name] Kirston Luty 03/17/2019, 1:10 PM  [name], M.A. CCC-SLP Acute Herbalist [phone] Office [phone]

## 2019-03-17 NOTE — Progress Notes (Signed)
PROGRESS NOTE    Gabrielle Wright  SFK:[id number] DOB: [birth year] DOA: 02/03/2019 PCP: Rudene Anda, MD   Brief Narrative:  Per HPI: 73 year old female PMHx from [place]. HTN , CAD, DM Type 2?  Noted to have a high fever 103.9. Reported to have cough. Taken to [place] [facility] per family. Was supported to have a urinary tract infection although her urine was clean. She never filled her prescription for antibiotics. Due to persistent hypotension and refractory nature of her hypertension she is transferred to [facility] 02/04/2019. She was in respiratory distress for abdominal chest wall paradoxus. She required intubation and central line placement. She had maxed out on peripheral IV vasopressors therefore central line was placed. Pulmonary critical care will be responsible for care at this time. She was placed in isolation since she came from a [place] entry of [place] with recent reports of coronavirus and that country. Note [place] is not on the list of infected countries. Infectious disease was called and they reported again diagnosis done on 1 reported countries.   Patient continues to remain stable on trach collar, but has periods of anxiety which caused her to go back on the ventilator.  She was weaned overnight for a period of approximately 8 hours, until she had further anxiety.  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 -Secondary to ischemic cardiomyopathy, pulmonary edema. - Continue weaning today.  Have increased anxiety medication, which I believe is a large part of patient not being able to get down to 28% FiO2 - See ischemic cardiomyopathy 03/15/2019: Continue current management. 03/17/2019: Continue weaning trial if patient will be willing to participate.  Ischemic cardiomyopathy-acute on chronic systolic and diastolic CHF - 1/74 echocardiogram EF 35 to 40%, LV diffuse hypokinesis, no targets for revascularization on catheterization - Cardiology following  -4/16 CVP? -Strict in and out +5.8 L -Daily weight      Filed Weights   03/14/19 0426 03/15/19 0500 03/16/19 0500  Weight: 76 kg 74.5 kg 75.3 kg  -Amiodarone 200 mg daily -Coreg 3.125 mg twice daily -Furosemide 40 mg daily -Plavix 75 mg daily (hold) -Hydralazine 10 mg 3 times daily -Isosorbide dinitrate 10 mg 3 times daily -Spironolactone 12.5 mg daily -4/9 transfuse 1 unit PRBC -Transfuse for hemoglobin<8 per AHA guidelines.  Unfortunately unless patient in distress may not be able to transfuse until hemoglobin<7 secondary to blood shortage. 03/17/2019: Seems stable.  Continue current management with Lasix IV daily  A. fib with RVR-resolved - See ischemic cardiomyopathy -No anticoagulation per Cardiology  Hypernatremia - Resolved  Diabetes type 2 uncontrolled with complication - 9/44 hemoglobin A1c= 10.1 - 4/16 increase Lantus 20 units daily - Custom SSI 03/17/2019: Continue to optimize and increase Lantus.  Agitation/Anxiety -Exacerbated by language barrier.  National language? - Clonazepam 1 mg QID -Lorazepam increased to 1 mg PRN -Zoloft 50 mg daily; increased on 4/19 -During  the day all shades and lights on -Video call with family  Insomnia  -Trazodone 50 mg daily  Dysphasia - Feedings per core track on hold secondary to abdominal  pain. - 4/16 consult for swallow evaluation   GI bleed     -Resolved patient's hemoglobin is stable    DVT prophylaxis:SCDs Code Status: Full Family Communication: None available Disposition Plan: Continue to wean off vent and control anxiety.   Consultants:   PCCM  Procedures:   None  Antimicrobials:  Anti-infectives (From admission, onward)   Start     Dose/Rate Route Frequency Ordered Stop   02/19/19 1200  ciprofloxacin (CIPRO) IVPB 400 mg     400 mg 200 mL/hr over 60 Minutes Intravenous Every 12 hours 02/19/19 1119 02/25/19 1321   02/18/19 1230  ciprofloxacin (CIPRO) IVPB 400 mg  Status:  Discontinued     400 mg 200 mL/hr over 60 Minutes Intravenous Every 24 hours 02/18/19 1127 02/19/19 1119   02/14/19 0900  cefTRIAXone (ROCEPHIN) 2 g in sodium chloride 0.9 % 100 mL IVPB     2 g 200 mL/hr over 30 Minutes Intravenous Every 24 hours 02/14/19 0846 02/16/19 1033   02/06/19 1900  penicillin G potassium 8 Million Units in dextrose 5 % 500 mL continuous infusion  Status:  Discontinued     8 Million Units 41.7 mL/hr over 12 Hours Intravenous Every 12 hours 02/06/19 1900 02/14/19 0846   02/06/19 1100  penicillin G potassium 8 Million Units in dextrose 5 % 500 mL continuous infusion  Status:  Discontinued     8 Million Units 41.7 mL/hr over 12 Hours Intravenous Every 12 hours 02/06/19 1008 02/06/19 1900   02/05/19 2300  penicillin G potassium 4 Million Units in dextrose 5 % 250 mL IVPB  Status:  Discontinued     4 Million Units 250 mL/hr over 60 Minutes Intravenous Every 6 hours 02/04/19 2216 02/04/19 2237   02/04/19 2300  vancomycin (VANCOCIN) 1,500 mg in sodium chloride 0.9 % 500 mL IVPB     1,500 mg 250 mL/hr over 120 Minutes Intravenous  Once 02/04/19 2216 02/05/19 0308   02/04/19 2300  penicillin G potassium 4 Million Units in dextrose 5 % 250 mL IVPB  Status:  Discontinued     4 Million Units 250 mL/hr over 60 Minutes Intravenous Every 6 hours 02/04/19 2237 02/06/19  1008   02/04/19 2230  clindamycin (CLEOCIN) IVPB 600 mg  Status:  Discontinued     600 mg 100 mL/hr over 30 Minutes Intravenous Every 8 hours 02/04/19 2216 02/05/19 0917   02/04/19 0315  piperacillin-tazobactam (ZOSYN) IVPB 3.375 g     3.375 g 100 mL/hr over 30 Minutes Intravenous  Once 02/04/19 0303 02/04/19 0408   02/04/19 0315  vancomycin (VANCOCIN) IVPB 1000 mg/200 mL premix     1,000 mg 200 mL/hr over 60 Minutes Intravenous  Once 02/04/19 0303 02/04/19 0424          Subjective: Patient seen and evaluated today with no new acute complaints or concerns. No acute concerns or events noted overnight. Tolerated weaning for 8 hours last night before anxiety.  Objective: Vitals:   03/17/19 0747 03/17/19 0751 03/17/19 0756 03/17/19 0800  BP: (!) 98/52  121/65 (!) 106/55  Pulse: 70  73 70  Resp: 12   19  Temp:  98.5 F (36.9 C)    TempSrc:  Oral    SpO2: 100%   100%  Weight:      Height:  Intake/Output Summary (Last 24 hours) at 03/17/2019 1040 Last data filed at 03/17/2019 0500 Gross per 24 hour  Intake 160 ml  Output 1450 ml  Net -1290 ml   Filed Weights   03/15/19 0500 03/16/19 0500 03/17/19 0336  Weight: 74.5 kg 75.3 kg 73.9 kg    Examination:  General exam: Appears calm and comfortable  Respiratory system: Clear to auscultation. Respiratory effort normal. Trach and FiO2 30% Cardiovascular system: S1 & S2 heard, RRR. No JVD, murmurs, rubs, gallops or clicks. No pedal edema. Gastrointestinal system: Abdomen is nondistended, soft and nontender. No organomegaly or masses felt. Normal bowel sounds heard. Central nervous system: Alert and awake Extremities: Symmetric 5 x 5 power. Skin: No rashes, lesions or ulcers Psychiatry: Cannot be assessed.     Data Reviewed: I have personally reviewed following labs and imaging studies  CBC: Recent Labs  Lab 03/13/19 0425 03/14/19 0425 03/15/19 0404 03/16/19 0353 03/17/19 0620  WBC 10.1 9.6 8.9 8.3 7.7  HGB  8.4* 8.5* 8.2* 7.7* 7.9*  HCT 30.0* 29.7* 28.1* 27.4* 28.5*  MCV 91.2 91.1 90.9 91.9 90.5  PLT 282 277 273 260 203   Basic Metabolic Panel: Recent Labs  Lab 03/11/19 0416 03/12/19 0508 03/14/19 0425 03/15/19 0404 03/16/19 0353 03/17/19 0620  NA 141 141 143 143 144 146*  K 4.0 3.8 4.3 4.1 4.2 4.0  CL 104 105 106 106 105 106  CO2 _0 33*  GLUCOSE 218* 239* 187* 222* 269* 148*  BUN 41* 43* 46* 49* 50* 43*  CREATININE 1.43* 1.35* 1.20* 1.23* 1.29* 1.17*  CALCIUM 8.4* 8.4* 8.5* 8.3* 8.3* 8.6*  MG  --  2.7* 2.9* 2.9* 2.9* 2.7*  PHOS 4.7* 3.4  --   --   --   --    GFR: Estimated Creatinine Clearance: 44.7 mL/min (A) (by C-G formula based on SCr of 1.17 mg/dL (H)). Liver Function Tests: Recent Labs  Lab 03/11/19 0416 03/12/19 0508  ALBUMIN 2.3* 2.4*   No results for input(s): LIPASE, AMYLASE in the last 168 hours. No results for input(s): AMMONIA in the last 168 hours. Coagulation Profile: No results for input(s): INR, PROTIME in the last 168 hours. Cardiac Enzymes: No results for input(s): CKTOTAL, CKMB, CKMBINDEX, TROPONINI in the last 168 hours. BNP (last 3 results) No results for input(s): PROBNP in the last 8760 hours. HbA1C: No results for input(s): HGBA1C in the last 72 hours. CBG: Recent Labs  Lab 03/16/19 1544 03/16/19 2009 03/16/19 2343 03/17/19 0330 03/17/19 0750  GLUCAP 120* 235* 165* 96 146*   Lipid Profile: No results for input(s): CHOL, HDL, LDLCALC, TRIG, CHOLHDL, LDLDIRECT in the last 72 hours. Thyroid Function Tests: No results for input(s): TSH, T4TOTAL, FREET4, T3FREE, THYROIDAB in the last 72 hours. Anemia Panel: No results for input(s): VITAMINB12, FOLATE, FERRITIN, TIBC, IRON, RETICCTPCT in the last 72 hours. Sepsis Labs: No results for input(s): PROCALCITON, LATICACIDVEN in the last 168 hours.  Recent Results (from the past 240 hour(s))  Culture, respiratory (non-expectorated)     Status: None   Collection Time: 03/12/19   9:25 AM  Result Value Ref Range Status   Specimen Description TRACHEAL ASPIRATE  Final   Special Requests NONE  Final   Gram Stain NO WBC SEEN RARE GRAM POSITIVE COCCI IN PAIRS   Final   Culture   Final    Consistent with normal respiratory flora. Performed at [facility], [place] [address] [place], [place] [address]  Report Status 03/14/2019 FINAL  Final  Culture, respiratory (non-expectorated)     Status: None   Collection Time: 03/15/19  9:00 AM  Result Value Ref Range Status   Specimen Description TRACHEAL ASPIRATE  Final   Special Requests NONE  Final   Gram Stain   Final    RARE WBC PRESENT, PREDOMINANTLY PMN FEW GRAM POSITIVE RODS FEW GRAM POSITIVE COCCI IN PAIRS RARE GRAM NEGATIVE RODS    Culture   Final    FEW Consistent with normal respiratory flora. Performed at [facility], [place] [address], [place], [place] [address]    Report Status 03/17/2019 FINAL  Final         Radiology Studies: Dg Abd 1 View  Result Date: 03/16/2019 CLINICAL DATA:  NG tube placement EXAM: ABDOMEN - 1 VIEW COMPARISON:  03/16/2019 FINDINGS: NG tube tip is in the distal stomach. IMPRESSION: NG tube tip in the distal stomach. Electronically Signed   By: Rolm Baptise M.D.   On: 03/16/2019 23:27   Dg Abd 1 View  Result Date: 03/16/2019 CLINICAL DATA:  NG tube placement EXAM: ABDOMEN - 1 VIEW COMPARISON:  Earlier same day FINDINGS: 1737 hours. Feeding tube no longer evident. NG tube tip is in the mid to distal stomach. Nonspecific bowel gas pattern within the visualized left abdomen. IMPRESSION: NG tube tip is in the mid to distal stomach. Electronically Signed   By: Misty Stanley M.D.   On: 03/16/2019 17:48   Dg Abd 1 View  Result Date: 03/16/2019 CLINICAL DATA:  Feeding tube placement. EXAM: ABDOMEN - 1 VIEW COMPARISON:  None. FINDINGS: A small bore feeding tube is identified with tip overlying the distal stomach. The visualized portions of the feeding tube are unremarkable.  IMPRESSION: Small bore feeding tube with tip overlying the distal stomach. No abnormalities of the visualized feeding tube identified. Electronically Signed   By: Margarette Canada M.D.   On: 03/16/2019 14:22   Dg Chest Port 1 View  Result Date: 03/17/2019 CLINICAL DATA:  Shortness of breath and congestion EXAM: PORTABLE CHEST 1 VIEW COMPARISON:  March 15, 2019 FINDINGS: Tracheostomy catheter tip is 1.5 cm above the carina. Nasogastric tube tip and side port are below the diaphragm. Central catheter tip is in the right atrium slightly distal to the superior vena cava-right atrium junction. No pneumothorax. There is cardiomegaly with pulmonary vascularity grossly normal. There is a left pleural effusion with left lower lobe consolidation. There is a smaller right pleural effusion. There is a slight degree of interstitial edema. IMPRESSION: Tube and catheter positions as described without pneumothorax. Stable cardiac prominence. Consolidation with left pleural effusion on the left. Suspect pneumonia, although alveolar edema could present in this manner. There is a smaller right pleural effusion. There is mild interstitial edema. A degree of superimposed congestive heart failure must be of concern. Electronically Signed   By: Lowella Grip III M.D.   On: 03/17/2019 10:02        Scheduled Meds: . sodium chloride   Intravenous Once  . amiodarone  200 mg Per Tube Daily  . aspirin  81 mg Per Tube Daily  . atorvastatin  40 mg Per Tube q1800  . carvedilol  3.125 mg Per NG tube BID WC  . chlorhexidine gluconate (MEDLINE KIT)  15 mL Mouth Rinse BID  . Chlorhexidine Gluconate Cloth  6 each Topical Q0600  . feeding supplement (GLUCERNA 1.5 CAL)  1,000 mL Per Tube Q24H  . feeding supplement (PRO-STAT SUGAR FREE  64)  30 mL Per Tube Daily  . furosemide  40 mg Intravenous Daily  . hydrALAZINE  10 mg Per Tube Q8H  . insulin aspart  0-24 Units Subcutaneous Q4H  . insulin glargine  25 Units Subcutaneous QHS  .  isosorbide dinitrate  10 mg Per Tube TID  . losartan  25 mg Per Tube Daily  . mouth rinse  15 mL Mouth Rinse 10 times per day  . montelukast  10 mg Oral QHS  . multivitamin  15 mL Per Tube Daily  . pantoprazole (PROTONIX) IV  40 mg Intravenous Q12H  . polyethylene glycol  17 g Oral BID  . sertraline  50 mg Per Tube Daily  . sodium chloride flush  10-40 mL Intracatheter Q12H  . spironolactone  12.5 mg Per Tube Daily  . traZODone  50 mg Per Tube QHS   Continuous Infusions: . sodium chloride Stopped (03/04/19 0815)     LOS: 41 days    Time spent: 30 minutes    Zyasia Halbleib Darleen Crocker, DO [facility] Pager [phone]  If 7PM-7AM, please contact night-coverage [email or web address] Password [email or web address] 03/17/2019, 10:40 AM

## 2019-03-17 NOTE — Progress Notes (Signed)
RT NOTE: RT placed patient on ATC 40% with RN and PT at bedside. Patient currently tolerating well and RT will continue to monitor.

## 2019-03-17 NOTE — Evaluation (Signed)
Clinical/Bedside Swallow Evaluation Patient Details  Name: Gabrielle Wright MRN: [id number] Date of Birth: Sep 11, 1946  Today's Date: 03/17/2019 Time: SLP Start Time (ACUTE ONLY): 1045 SLP Stop Time (ACUTE ONLY): 1103 SLP Time Calculation (min) (ACUTE ONLY): 18 min  Past Medical History:  Past Medical History:  Diagnosis Date  . Coronary artery disease   . Diabetes mellitus without complication (HCC)   . Hypertension    Past Surgical History:  Past Surgical History:  Procedure Laterality Date  . CORONARY ANGIOPLASTY WITH STENT PLACEMENT    . CORONARY/GRAFT ACUTE MI REVASCULARIZATION N/A 02/09/2019   Procedure: Coronary/Graft Acute MI Revascularization;  Surgeon: Lyn Records, MD;  Location: [facility] INVASIVE CV LAB;  Service: Cardiovascular;  Laterality: N/A;  . LEFT HEART CATH AND CORONARY ANGIOGRAPHY N/A 02/09/2019   Procedure: LEFT HEART CATH AND CORONARY ANGIOGRAPHY;  Surgeon: Lyn Records, MD;  Location: [facility] INVASIVE CV LAB;  Service: Cardiovascular;  Laterality: N/A;   HPI:  73 y.o. female admitted on 02/03/19 for fever and cough (to [facility]) and was transferred to [facility] on 02/04/19 for hypotension and respiratory distress (acute respiratory failure/pulmonary edema) requiring intubation 3/10.  She was visiting family from [place] and COVID 19 tests were negative.  She was found to have group A strep bacteremia (septic shiock), rhinovirus,  and course complicated by NSTEMI and new LV dysfunction s/p cardiac cath on 02/09/19.  Other dx include ischemic cardiomyopathy, hyperkalemia, DM2- uncontrolled, and abdominal distention.  Pt with significant PMH of DM, HTN, anc CAD. Trach placed on 3/31   Assessment / Plan / Recommendation Clinical Impression  Pt is dysphonic and cannot be cued to cough or clear her throat on command. She did not like ice chips, holding them in her mouth and not wanting more because of the cold. She did consume water that was room temperature, but even small  amounts resulted in increase in WOB, changes in respirations. When reflexive coughing was elicited it was quite prolonged. 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 It has been quite some time since she got her trach, but she remains deconditioned overall. Recommend that she remain NPO for now. She will need MBS, especially before decisions are made about need for PEG, but would like to give her more time before completing to do additional trials with SLP. Will f/u for readiness. SLP Visit Diagnosis: Dysphagia, unspecified (R13.10)    Aspiration Risk  Severe aspiration risk;Moderate aspiration risk    Diet Recommendation NPO;Alternative means - temporary   Medication Administration: Via alternative means    Other  Recommendations Oral Care Recommendations: Oral care QID   Follow up Recommendations Skilled Nursing facility      Frequency and Duration min 2x/week  2 weeks       Prognosis Prognosis for Safe Diet Advancement: Good      Swallow Study   General HPI: 73 y.o. female admitted on 02/03/19 for fever and cough (to [facility]) and was transferred to [facility] on 02/04/19 for hypotension and respiratory distress (acute respiratory failure/pulmonary edema) requiring intubation 3/10.  She was visiting family from [place] and COVID 19 tests were negative.  She was found to have group A strep bacteremia (septic shiock), rhinovirus,  and course complicated by NSTEMI and new LV dysfunction s/p cardiac cath on 02/09/19.  Other dx include ischemic cardiomyopathy, hyperkalemia, DM2- uncontrolled, and abdominal distention.  Pt with significant PMH of DM, HTN, anc  CAD. Trach placed on 3/31 Type of Study: Bedside Swallow Evaluation Previous Swallow Assessment: none in chart Diet Prior to this Study: NPO;NG Tube Temperature Spikes Noted: No Respiratory Status: Trach;Trach Collar Trach Size and Type:  Cuff;#6;Deflated;With PMSV in place History of Recent Intubation: Yes Length of Intubations (days): 20 days(intubated 3x) Date extubated: (trach 3/31) Behavior/Cognition: Alert;Cooperative;Requires cueing Oral Cavity Assessment: Dry Oral Care Completed by SLP: Recent completion by staff Oral Cavity - Dentition: Adequate natural dentition Self-Feeding Abilities: Needs assist Patient Positioning: Upright in chair Baseline Vocal Quality: Wet;Hoarse;Low vocal intensity Volitional Cough: Cognitively unable to elicit Volitional Swallow: Unable to elicit    Oral/Motor/Sensory Function Overall Oral Motor/Sensory Function: Generalized oral weakness   Ice Chips Ice chips: Impaired Presentation: Spoon Oral Phase Functional Implications: Oral holding   Thin Liquid Thin Liquid: Impaired Presentation: Spoon Pharyngeal  Phase Impairments: Cough - Immediate;Change in Vital Signs    Nectar Thick Nectar Thick Liquid: Not tested   Honey Thick Honey Thick Liquid: Not tested   Puree Puree: Not tested   Solid     Solid: Not tested      Gabrielle Wright 03/17/2019,1:31 PM  Gabrielle Wright, M.A. CCC-SLP Acute Herbalist [phone] Office [phone]

## 2019-03-18 ENCOUNTER — Inpatient Hospital Stay (HOSPITAL_COMMUNITY): Payer: Medicaid Other

## 2019-03-18 LAB — BASIC METABOLIC PANEL
Anion gap: 9 (ref 5–15)
BUN: 48 mg/dL — ABNORMAL HIGH (ref 8–23)
CO2: 30 mmol/L (ref 22–32)
Calcium: 8.9 mg/dL (ref 8.9–10.3)
Chloride: 109 mmol/L (ref 98–111)
Creatinine, Ser: 1.08 mg/dL — ABNORMAL HIGH (ref 0.44–1.00)
GFR calc Af Amer: 59 mL/min — ABNORMAL LOW (ref >60)
GFR calc non Af Amer: 51 mL/min — ABNORMAL LOW (ref >60)
Glucose, Bld: 212 mg/dL — ABNORMAL HIGH (ref 70–99)
Potassium: 4.2 mmol/L (ref 3.5–5.1)
Sodium: 148 mmol/L — ABNORMAL HIGH (ref 135–145)

## 2019-03-18 LAB — CBC
HCT: 31.5 % — ABNORMAL LOW (ref 36.0–46.0)
Hemoglobin: 8.8 g/dL — ABNORMAL LOW (ref 12.0–15.0)
MCH: 25.4 pg — ABNORMAL LOW (ref 26.0–34.0)
MCHC: 27.9 g/dL — ABNORMAL LOW (ref 30.0–36.0)
MCV: 90.8 fL (ref 80.0–100.0)
Platelets: 280 10*3/uL (ref 150–400)
RBC: 3.47 MIL/uL — ABNORMAL LOW (ref 3.87–5.11)
RDW: 17.1 % — ABNORMAL HIGH (ref 11.5–15.5)
WBC: 9.3 10*3/uL (ref 4.0–10.5)
nRBC: 0 % (ref 0.0–0.2)

## 2019-03-18 LAB — GLUCOSE, CAPILLARY
Glucose-Capillary: 150 mg/dL — ABNORMAL HIGH (ref 70–99)
Glucose-Capillary: 166 mg/dL — ABNORMAL HIGH (ref 70–99)
Glucose-Capillary: 168 mg/dL — ABNORMAL HIGH (ref 70–99)
Glucose-Capillary: 187 mg/dL — ABNORMAL HIGH (ref 70–99)
Glucose-Capillary: 198 mg/dL — ABNORMAL HIGH (ref 70–99)
Glucose-Capillary: 218 mg/dL — ABNORMAL HIGH (ref 70–99)
Glucose-Capillary: 222 mg/dL — ABNORMAL HIGH (ref 70–99)

## 2019-03-18 LAB — MAGNESIUM: Magnesium: 2.9 mg/dL — ABNORMAL HIGH (ref 1.7–2.4)

## 2019-03-18 MED ORDER — FREE WATER
200.0000 mL | Freq: Three times a day (TID) | Status: DC
  Administered 2019-03-18 – 2019-03-19 (×3): 200 mL

## 2019-03-18 MED ORDER — GLUCERNA 1.5 CAL PO LIQD
1000.0000 mL | ORAL | Status: DC
  Administered 2019-03-18 – 2019-03-19 (×2): 1000 mL
  Filled 2019-03-18 (×4): qty 1185

## 2019-03-18 MED ORDER — CHLORHEXIDINE GLUCONATE 0.12 % MT SOLN
OROMUCOSAL | Status: AC
  Administered 2019-03-18: 10:00:00
  Filled 2019-03-18: qty 15

## 2019-03-18 NOTE — Progress Notes (Signed)
PROGRESS NOTE    Gabrielle Wright  ZJQ:[id number] DOB: [birth year] DOA: 02/03/2019 PCP: Rudene Anda, MD   Brief Narrative:  Per HPI: 73 year old female PMHxfrom [place].HTN , CAD, DM Type 2? Noted to have a high fever 103.9. Reported to have cough. Taken to [place] [facility] per family. Was supported to have a urinary tract infection although her urine was clean. She never filled her prescription for antibiotics. Due to persistent hypotension and refractory nature of her hypertension she is transferred to [facility] 02/04/2019. She was in respiratory distress for abdominal chest wall paradoxus. She required intubation and central line placement. She had maxed out on peripheral IV vasopressors therefore central line was placed. Pulmonary critical care will be responsible for care at this time. She was placed in isolation since she came from a [place] entry of [place] with recent reports of coronavirus and that country. Note [place] is not on the list of infected countries. Infectious disease was called and they reported again diagnosis done on 1 reported countries.   4/21: Patient has done quite well on trach collar for over 24 hours and has had no further anxiety noted.  She is stable for transfer to progressive care at this point in time.  It does appear that further speech/swallow evaluation will be required with likely MBS at which point PEG can be considered.  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 -Secondary to ischemic cardiomyopathy, pulmonary edema. - Continue weaning today. Have increased anxiety medication, which I believe is a large part of patient not being able to get down to 28% FiO2 - See ischemic cardiomyopathy 03/18/2019: Patient now on trach collar>24 hours and stable for transfer to progressive.  Ischemic cardiomyopathy-acute on chronic systolic and diastolic CHF - 2/40 echocardiogram EF 35 to 40%, LV diffuse hypokinesis, no targets for revascularization on catheterization - Cardiology s/o -Daily weight 72kg on 4/21      Filed Weights   03/14/19 0426 03/15/19 0500 03/16/19 0500  Weight: 76 kg 74.5 kg 75.3 kg  -Amiodarone 200 mg daily -Coreg 3.125 mg twice daily -Furosemide 40 mg daily -Plavix 75 mg daily(hold) -Hydralazine 10 mg 3 times daily -Isosorbide dinitrate 10 mg 3 times daily -Spironolactone 12.5 mg daily -4/9transfuse 1 unit PRBC -Transfuse for hemoglobin<8 per AHA guidelines. Unfortunately unless patient in distress may not be able to transfuse until hemoglobin<7 secondary to blood shortage. 03/17/2019: Seems stable. Continue current management with Lasix IV daily  A. fib with RVR-resolved - See ischemic cardiomyopathy -No anticoagulation per Cardiology  Hypernatremia - Resolved  Diabetes type 2 uncontrolled with complication - 9/73 hemoglobin A1c= 10.1 - 4/16 increase Lantus 20 units daily - Custom SSI 03/18/2019: Continue to optimize and increase Lantus.  Agitation/Anxiety -Exacerbated by language barrier. National language? - Clonazepam 1 mg QID -Lorazepam increased to 1 mg PRN on 4/20; tolerating well -Zoloft 50 mg daily; increased on  4/19 -During the day all shades and lights on -Video call with family  Insomnia  -Trazodone 50 mg daily  Dysphagia - Recheck  NGT positioning with KUB today 4/21 -SLP following with likely need for MBS soon and possible PEG  GI bleed     -Resolved patient's hemoglobin is stable  Mild hypernatremia -Add free water via NGT today; continue Lasix to maintain euvolemia -Recheck AM labs   DVT prophylaxis:SCDs Code Status: Full Family Communication: None available; will video chat with patient throughout the day Disposition Plan: Transfer to progressive unit today since on trach collar. Might need PEG prior to DC.   Consultants:   PCCM  Procedures:   None  Antimicrobials:  Anti-infectives (From admission, onward)   Start     Dose/Rate Route Frequency Ordered Stop   02/19/19 1200  ciprofloxacin (CIPRO) IVPB 400 mg     400 mg 200 mL/hr over 60 Minutes Intravenous Every 12 hours 02/19/19 1119 02/25/19 1321   02/18/19 1230  ciprofloxacin (CIPRO) IVPB 400 mg  Status:  Discontinued     400 mg 200 mL/hr over 60 Minutes Intravenous Every 24 hours 02/18/19 1127 02/19/19 1119   02/14/19 0900  cefTRIAXone (ROCEPHIN) 2 g in sodium chloride 0.9 % 100 mL IVPB     2 g 200 mL/hr over 30 Minutes Intravenous Every 24 hours 02/14/19 0846 02/16/19 1033   02/06/19 1900  penicillin G potassium 8 Million Units in dextrose 5 % 500 mL continuous infusion  Status:  Discontinued     8 Million Units 41.7 mL/hr over 12 Hours Intravenous Every 12 hours 02/06/19 1900 02/14/19 0846   02/06/19 1100  penicillin G potassium 8 Million Units in dextrose 5 % 500 mL continuous infusion  Status:  Discontinued     8 Million Units 41.7 mL/hr over 12 Hours Intravenous Every 12 hours 02/06/19 1008 02/06/19 1900   02/05/19 2300  penicillin G potassium 4 Million Units in dextrose 5 % 250 mL IVPB  Status:  Discontinued     4 Million Units 250 mL/hr over 60 Minutes Intravenous Every 6 hours 02/04/19 2216 02/04/19 2237   02/04/19 2300  vancomycin (VANCOCIN) 1,500 mg in sodium chloride 0.9 % 500 mL IVPB     1,500 mg 250 mL/hr over 120  Minutes Intravenous  Once 02/04/19 2216 02/05/19 0308   02/04/19 2300  penicillin G potassium 4 Million Units in dextrose 5 % 250 mL IVPB  Status:  Discontinued     4 Million Units 250 mL/hr over 60 Minutes Intravenous Every 6 hours 02/04/19 2237 02/06/19 1008   02/04/19 2230  clindamycin (CLEOCIN) IVPB 600 mg  Status:  Discontinued     600 mg 100 mL/hr over 30 Minutes Intravenous Every 8 hours 02/04/19 2216 02/05/19 0917   02/04/19 0315  piperacillin-tazobactam (ZOSYN) IVPB 3.375 g     3.375 g 100 mL/hr over 30 Minutes Intravenous  Once 02/04/19 0303 02/04/19 0408   02/04/19 0315  vancomycin (VANCOCIN) IVPB 1000 mg/200 mL premix     1,000 mg 200 mL/hr over 60 Minutes Intravenous  Once 02/04/19 0303 02/04/19 0424      Subjective: Patient seen and evaluated today with no new acute complaints or concerns. No acute concerns or events noted overnight. She has had no further anxiety and remains on trach collar with no respiratory distress over the last 24 hours.  Objective: Vitals:   03/18/19 0800 03/18/19 0818 03/18/19 0900 03/18/19 1000  BP: 126/90 126/90 (!) 102/46 126/60  Pulse: 89  83 79 83  Resp: (!) 25  12 (!) 23  Temp:      TempSrc:      SpO2: 100%  100% 100%  Weight:      Height:        Intake/Output Summary (Last 24 hours) at 03/18/2019 1045 Last data filed at 03/18/2019 1000 Gross per 24 hour  Intake 2010 ml  Output 550 ml  Net 1460 ml   Filed Weights   03/16/19 0500 03/17/19 0336 03/18/19 0500  Weight: 75.3 kg 73.9 kg 72 kg    Examination:  General exam: Appears calm and comfortable  Respiratory system: Clear to auscultation. Respiratory effort normal. Cardiovascular system: S1 & S2 heard, RRR. No JVD, murmurs, rubs, gallops or clicks. No pedal edema. Gastrointestinal system: Abdomen is nondistended, soft and nontender. No organomegaly or masses felt. Normal bowel sounds heard. Central nervous system: Alert and oriented. No focal neurological deficits.  Extremities: Symmetric 5 x 5 power. Skin: No rashes, lesions or ulcers Psychiatry: Judgement and insight appear normal. Mood & affect appropriate.     Data Reviewed: I have personally reviewed following labs and imaging studies  CBC: Recent Labs  Lab 03/14/19 0425 03/15/19 0404 03/16/19 0353 03/17/19 0620 03/18/19 0205  WBC 9.6 8.9 8.3 7.7 9.3  HGB 8.5* 8.2* 7.7* 7.9* 8.8*  HCT 29.7* 28.1* 27.4* 28.5* 31.5*  MCV 91.1 90.9 91.9 90.5 90.8  PLT 277 273 260 267 903   Basic Metabolic Panel: Recent Labs  Lab 03/12/19 0508 03/14/19 0425 03/15/19 0404 03/16/19 0353 03/17/19 0620 03/18/19 0205  NA 141 143 143 144 146* 148*  K 3.8 4.3 4.1 4.2 4.0 4.2  CL 105 106 106 105 106 109  CO2 25 31 27 27  33* 30  GLUCOSE 239* 187* 222* 269* 148* 212*  BUN 43* 46* 49* 50* 43* 48*  CREATININE 1.35* 1.20* 1.23* 1.29* 1.17* 1.08*  CALCIUM 8.4* 8.5* 8.3* 8.3* 8.6* 8.9  MG 2.7* 2.9* 2.9* 2.9* 2.7* 2.9*  PHOS 3.4  --   --   --   --   --    GFR: Estimated Creatinine Clearance: 47.9 mL/min (A) (by C-G formula based on SCr of 1.08 mg/dL (H)). Liver Function Tests: Recent Labs  Lab 03/12/19 0508  ALBUMIN 2.4*   No results for input(s): LIPASE, AMYLASE in the last 168 hours. No results for input(s): AMMONIA in the last 168 hours. Coagulation Profile: No results for input(s): INR, PROTIME in the last 168 hours. Cardiac Enzymes: No results for input(s): CKTOTAL, CKMB, CKMBINDEX, TROPONINI in the last 168 hours. BNP (last 3 results) No results for input(s): PROBNP in the last 8760 hours. HbA1C: No results for input(s): HGBA1C in the last 72 hours. CBG: Recent Labs  Lab 03/17/19 1546 03/17/19 2005 03/18/19 0003 03/18/19 0402 03/18/19 0739  GLUCAP 164* 248* 222* 150* 166*   Lipid Profile: No results for input(s): CHOL, HDL, LDLCALC, TRIG, CHOLHDL, LDLDIRECT in the last 72 hours. Thyroid Function Tests: No results for input(s): TSH, T4TOTAL, FREET4, T3FREE, THYROIDAB in the last 72  hours. Anemia Panel: No results for input(s): VITAMINB12, FOLATE, FERRITIN, TIBC, IRON, RETICCTPCT in the last 72 hours. Sepsis Labs: No results for input(s): PROCALCITON, LATICACIDVEN in the last 168 hours.  Recent Results (from the past 240 hour(s))  Culture, respiratory (non-expectorated)     Status: None   Collection Time: 03/12/19  9:25 AM  Result Value Ref Range Status   Specimen Description TRACHEAL ASPIRATE  Final   Special Requests NONE  Final   Gram Stain NO WBC SEEN RARE GRAM POSITIVE COCCI IN PAIRS   Final   Culture   Final    Consistent with normal respiratory flora. Performed at [facility], [place] [address] [place], [place] [address]    Report Status 03/14/2019 FINAL  Final  Culture, respiratory (non-expectorated)     Status: None   Collection Time: 03/15/19  9:00 AM  Result Value Ref Range Status   Specimen Description TRACHEAL ASPIRATE  Final   Special Requests NONE  Final   Gram Stain   Final    RARE WBC PRESENT, PREDOMINANTLY PMN FEW GRAM POSITIVE RODS FEW GRAM POSITIVE COCCI IN PAIRS RARE GRAM NEGATIVE RODS    Culture   Final    FEW Consistent with normal respiratory flora. Performed at [facility], [place] [address], [place], [place] [address]    Report Status 03/17/2019 FINAL  Final         Radiology Studies: Dg Abd 1 View  Result Date: 03/16/2019 CLINICAL DATA:  NG tube placement EXAM: ABDOMEN - 1 VIEW COMPARISON:  03/16/2019 FINDINGS: NG tube tip is in the distal stomach. IMPRESSION: NG tube tip in the distal stomach. Electronically Signed   By: Rolm Baptise M.D.   On: 03/16/2019 23:27   Dg Abd 1 View  Result Date: 03/16/2019 CLINICAL DATA:  NG tube placement EXAM: ABDOMEN - 1 VIEW COMPARISON:  Earlier same day FINDINGS: 1737 hours. Feeding tube no longer evident. NG tube tip is in the mid to distal stomach. Nonspecific bowel gas pattern within the visualized left abdomen. IMPRESSION: NG tube tip is in the mid to distal stomach.  Electronically Signed   By: Misty Stanley M.D.   On: 03/16/2019 17:48   Dg Abd 1 View  Result Date: 03/16/2019 CLINICAL DATA:  Feeding tube placement. EXAM: ABDOMEN - 1 VIEW COMPARISON:  None. FINDINGS: A small bore feeding tube is identified with tip overlying the distal stomach. The visualized portions of the feeding tube are unremarkable. IMPRESSION: Small bore feeding tube with tip overlying the distal stomach. No abnormalities of the visualized feeding tube identified. Electronically Signed   By: Margarette Canada M.D.   On: 03/16/2019 14:22   Dg Chest Port 1 View  Result Date: 03/17/2019 CLINICAL DATA:  Shortness of breath and congestion EXAM: PORTABLE CHEST 1 VIEW COMPARISON:  March 15, 2019 FINDINGS: Tracheostomy catheter tip is 1.5 cm above the carina. Nasogastric tube tip and side port are below the diaphragm. Central catheter tip is in the right atrium slightly distal to the superior vena cava-right atrium junction. No pneumothorax. There is cardiomegaly with pulmonary vascularity grossly normal. There is a left pleural effusion with left lower lobe consolidation. There is a smaller right pleural effusion. There is a slight degree of interstitial edema. IMPRESSION: Tube and catheter positions as described without pneumothorax. Stable cardiac prominence. Consolidation with left pleural effusion on the left. Suspect pneumonia, although alveolar edema could present in this manner. There is a smaller right pleural effusion. There is mild interstitial edema. A degree of superimposed congestive heart failure must be of concern. Electronically Signed   By: Lowella Grip III M.D.   On: 03/17/2019 10:02        Scheduled Meds: . sodium chloride   Intravenous Once  . amiodarone  200 mg Per Tube Daily  . aspirin  81 mg Per Tube Daily  . atorvastatin  40 mg Per Tube q1800  . carvedilol  3.125 mg  Per NG tube BID WC  . chlorhexidine gluconate (MEDLINE KIT)  15 mL Mouth Rinse BID  . Chlorhexidine  Gluconate Cloth  6 each Topical Q0600  . feeding supplement (GLUCERNA 1.5 CAL)  1,000 mL Per Tube Q24H  . feeding supplement (PRO-STAT SUGAR FREE 64)  30 mL Per Tube Daily  . free water  200 mL Per Tube Q8H  . furosemide  40 mg Intravenous Daily  . hydrALAZINE  10 mg Per Tube Q8H  . insulin aspart  0-24 Units Subcutaneous Q4H  . insulin glargine  25 Units Subcutaneous QHS  . isosorbide dinitrate  10 mg Per Tube TID  . losartan  25 mg Per Tube Daily  . mouth rinse  15 mL Mouth Rinse 10 times per day  . montelukast  10 mg Oral QHS  . multivitamin  15 mL Per Tube Daily  . pantoprazole (PROTONIX) IV  40 mg Intravenous Q12H  . polyethylene glycol  17 g Oral BID  . sertraline  50 mg Per Tube Daily  . sodium chloride flush  10-40 mL Intracatheter Q12H  . spironolactone  12.5 mg Per Tube Daily  . traZODone  50 mg Per Tube QHS   Continuous Infusions: . sodium chloride Stopped (03/04/19 0815)     LOS: 42 days    Time spent: 30 minutes    Wynne Rozak Darleen Crocker, DO [facility] Pager [phone]  If 7PM-7AM, please contact night-coverage [email or web address] Password [facility] 03/18/2019, 10:45 AM

## 2019-03-18 NOTE — Progress Notes (Signed)
Nutrition Follow-up  RD working remotely.  DOCUMENTATION CODES:   Not applicable  INTERVENTION:   Increase rate of continuous TF via NGT: - Glucerna 1.5 @ 50 ml/hr (1200 ml/day) - Free water per MD, currently 200 ml q 8 hours  Tube feeding regimen provides 1800 kcal, 99 grams of protein, and 1511 ml of H2O (100% of needs).  If pt able to tolerate increased rate of continuous TF, will transition back to bolus feeds and assess tolerance. Previously, pt did not tolerate bolus feeds and had abdominal pain.  - d/c Pro-stat  NUTRITION DIAGNOSIS:   Inadequate oral intake related to inability to eat as evidenced by NPO status.  Ongoing, being addressed via TF  GOAL:   Patient will meet greater than or equal to 90% of their needs  Met via TF  MONITOR:   Diet advancement, Weight trends, I & O's, Labs, TF tolerance  REASON FOR ASSESSMENT:   Consult Enteral/tube feeding initiation and management  ASSESSMENT:   73 yo female with PMH of DM-2, HTN, CAD from [place] who was admitted with fever and respiratory failure requiring intubation. Admission complicated by NSTEMI and new LV dysfunction.  3/18  - failed extubation 3/30- extubated  3/31 - DNR rescinded, re-intubated, trach  4/03 - post pyloric Cortrak placed 4/08 - pt vomiting, NGT placed to suction, TF stopped 4/13 - abdominal pain, changed to continuous feedings 4/19 - Cortrak clogged and removed, NGT placed with tip in distal stomach 4/20 - SLP recommending NPO with plan for MBS at later date  Noted anxiety has been an issue when pt weaning from vent and on TC trials. Today, pt has been on TC for over 24 hours and is stable for transfer out of ICU.  Per SLP note, MBS needed prior to considering PEG placement.  Now that pt tolerating TC, will increase TF rate to better meet pt's needs. Discussed increase with pharmacy who was able to make that change in the order.  Spoke with RN. Pt tolerating current TF  without issue.  Reviewed weights in chart. Overall, pt's weight down 18 lbs since admission which could be related to fluctuating fluid status. Weight has been trending up over the last 10 days.  Current TF regimen: Glucerna 1.5 @ 40 ml/hr, Pro-stat 30 ml daily, Free water 200 ml q 8 hours  Medications reviewed and include: Lasix 40 mg daily, SSI q 4 hours, Lantus 25 units daily, liquid MVI, Protonix, Miralax  Labs reviewed: sodium 148 (H), BUN 48 (H), creatinine 1.08 (H), magnesium 2.9 (H), hemoglobin 8.8 (L) CBG's: 166, 150, 222, 248, 164 x 24 hours  UOP: 350 ml x 24 hours I/O's: +7.7 L since admit  Diet Order:   Diet Order            Diet NPO time specified  Diet effective now              EDUCATION NEEDS:   No education needs have been identified at this time  Skin:  Skin Assessment: Reviewed RN Assessment  Last BM:  03/18/19  Height:   Ht Readings from Last 1 Encounters:  03/08/19 '5\' 6"'$  (1.676 m)    Weight:   Wt Readings from Last 1 Encounters:  03/18/19 72 kg    Ideal Body Weight:  59.1 kg  BMI:  Body mass index is 25.62 kg/m.  Estimated Nutritional Needs:   Kcal:  1600-1800  Protein:  85-100 grams  Fluid:  1.6-1.8 L    Gabrielle Wright  Vertell Limber, MS, RD, [place] [facility] Pager: [phone] Weekend/After Hours: [phone]

## 2019-03-18 NOTE — Progress Notes (Signed)
Report called to [facility] on 2W.  All questions addressed and answered.

## 2019-03-18 NOTE — Progress Notes (Signed)
Returned to room for PIV and PICC line removal. Therapist in room.

## 2019-03-18 NOTE — Progress Notes (Signed)
  Speech Language Pathology Treatment: Dysphagia;Passy Muir Speaking valve  Patient Details Name: Gabrielle Wright MRN: [id number] DOB: 06-13-1946 Today's Date: 03/18/2019 Time: 9675-9163 SLP Time Calculation (min) (ACUTE ONLY): 34 min  Assessment / Plan / Recommendation Clinical Impression  Pt wore her PMV for almost 20 minutes again, with period checking as pt would report difficulty breathing, but no air trapping was noted and her VS remained stable. When she does speak it is very soft and hoarse, with teleinterpreter ([place] [phone]) commenting a few times that she could not hear the pt. Max cues were provided throughout session for pt to try to speak rather than using gestures and mouthing. Min cues were also provided for pt to use yankauer herself as she orally expectorated secretions. Pt was requesting water and food, but when presented with water, said she was scared to try it. With encouragement she consumed minimal amounts of water per bolus, only taking about half of what was on the spoon, and still letting about half of that fall out of her mouth without making attempts to self-manage. Clinical assessment is very limited given small amounts of POs consumed at a time - instrumental assessment is warranted. Will likely plan for next date.   HPI HPI: 73 y.o. female admitted on 02/03/19 for fever and cough (to [facility]) and was transferred to [facility] on 02/04/19 for hypotension and respiratory distress (acute respiratory failure/pulmonary edema) requiring intubation 3/10.  She was visiting family from [place] and COVID 19 tests were negative.  She was found to have group A strep bacteremia (septic shiock), rhinovirus,  and course complicated by NSTEMI and new LV dysfunction s/p cardiac cath on 02/09/19.  Other dx include ischemic cardiomyopathy, hyperkalemia, DM2- uncontrolled, and abdominal distention.  Pt with significant PMH of DM, HTN, anc CAD. Trach placed on 3/31      SLP Plan  MBS       Recommendations  Diet recommendations: NPO Medication Administration: Via alternative means      Patient may use Passy-Muir Speech Valve: Intermittently with supervision PMSV Supervision: Full MD: Please consider changing trach tube to : Cuffless;Smaller size         Oral Care Recommendations: Oral care QID Follow up Recommendations: Skilled Nursing facility SLP Visit Diagnosis: Dysphagia, unspecified (R13.10) Plan: MBS       GO                Gabrielle Wright 03/18/2019, 3:18 PM  Gabrielle Wright, M.A. CCC-SLP Acute Herbalist [phone] Office [phone]

## 2019-03-18 NOTE — Progress Notes (Addendum)
Occupational Therapy Treatment Patient Details Name: Gabrielle Wright MRN: [id number] DOB: 1946-11-20 Today's Date: 03/18/2019    History of present illness 73 y.o. female admitted on 02/03/19 for fever and cough (to [facility]) and was transferred to [facility] on 02/04/19 for hypotension and respiratory distress (acute respiratory failure/pulmonary edema) requiring intubation 3/10-current (time of PT eval 02/27/2019).  She was visiting family from [place] and COVID 19 tests were negative.  She was found to have group A strep bacteremia (septic shiock), rhinovirus,  and course complicated by NSTEMI and new LV dysfunction s/p cardiac cath on 02/09/19.  Cardiology following.  Other dx include ischemic cardiomyopathy, hyperkalemia, DM2- uncontrolled, and abdominal distention.  Pt with significant PMH of DM, HTN, anc CAD. Trach placed on 3/31   OT comments  Patient seated in recliner and eager to participate.  Patient perseverating on requesting "water to drink", reporting "she took 3 sips yesterday and she is very thirsty".  Educated at this time, she is not able to drink and it is not safe; but required constant education throughout session.  Redirected with oral care, setup assist using suction swab.  Mod assist +2 for stand pivot transfer to EOB.  Pt continues to fatigue easily and has poor activity tolerance.  Utilized interpreter throughout session, with pt writing responses.   Will continue to follow, goals updated.    Follow Up Recommendations  Supervision/Assistance - 24 hour;SNF    Equipment Recommendations  Other (comment)(TBD at next venue of care)    Recommendations for Other Services      Precautions / Restrictions Precautions Precautions: Fall Precaution Comments: trach, vent at night Restrictions Weight Bearing Restrictions: No       Mobility Bed Mobility Overal bed mobility: Needs Assistance Bed Mobility: Sit to Supine       Sit to supine: +2 for physical  assistance;Total assist   General bed mobility comments: from EOB pt requires total assist to return to supine, poor initation and sequencing of mobility and fatigued  Transfers Overall transfer level: Needs assistance Equipment used: 2 person hand held assist Transfers: Sit to/from [employer] Transfers Sit to Stand: Mod assist;+2 physical assistance;From elevated surface;+2 safety/equipment Stand pivot transfers: Mod assist;+2 physical assistance;+2 safety/equipment       General transfer comment: cueing for hand placement, sequencing, techique and posture; physical support for balance and steadying during sit to stand and transition; fatigues quickly    Balance Overall balance assessment: Needs assistance Sitting-balance support: Feet supported;No upper extremity supported Sitting balance-Leahy Scale: Fair Sitting balance - Comments: brief sitting EOB with min guard to min assist staticall y   Standing balance support: Bilateral upper extremity supported;During functional activity Standing balance-Leahy Scale: Poor Standing balance comment: reliant on B UE and external support                            ADL either performed or assessed with clinical judgement   ADL Overall ADL's : Needs assistance/impaired     Grooming: Oral care;Sitting;Supervision/safety Grooming Details (indicate cue type and reason): suction swab for oral care with setup assistance, while seated supported                  Toilet Transfer: +2 for physical assistance;+2 for safety/equipment;Stand-pivot;Moderate assistance Toilet Transfer Details (indicate cue type and reason): recliner to EOB         Functional mobility during ADLs: Maximal assistance;+2 for physical assistance;+2 for safety/equipment General ADL Comments:  fatigues very quickly     Vision       Perception     Praxis      Cognition Arousal/Alertness: Awake/alert Behavior During Therapy: WFL for tasks  assessed/performed Overall Cognitive Status: Difficult to assess                                 General Comments: utilizer interpreter with pt writing responses, pt perseverating on "wanting a drink of water" with poor understanding that it is unsafe at this time         Exercises     Shoulder Instructions       General Comments Interpreter ID: [id number]    Pertinent Vitals/ Pain       Pain Assessment: Faces Faces Pain Scale: Hurts little more Pain Location: headache Pain Descriptors / Indicators: Headache Pain Intervention(s): Limited activity within patient's tolerance;Repositioned  Home Living                                          Prior Functioning/Environment              Frequency  Min 3X/week        Progress Toward Goals  OT Goals(current goals can now be found in the care plan section)  Progress towards OT goals: Progressing toward goals  Acute Rehab OT Goals Patient Stated Goal: not stated today  Plan Discharge plan remains appropriate;Frequency remains appropriate    Co-evaluation                 AM-PAC OT "6 Clicks" Daily Activity     Outcome Measure   Help from another person eating meals?: Total(NPO) Help from another person taking care of personal grooming?: A Lot Help from another person toileting, which includes using toliet, bedpan, or urinal?: A Lot Help from another person bathing (including washing, rinsing, drying)?: A Lot Help from another person to put on and taking off regular upper body clothing?: Total Help from another person to put on and taking off regular lower body clothing?: Total 6 Click Score: 9    End of Session Equipment Utilized During Treatment: Gait belt;Oxygen(oxgyen via trach)  OT Visit Diagnosis: Muscle weakness (generalized) (M62.81);Other symptoms and signs involving cognitive function   Activity Tolerance Patient limited by fatigue   Patient Left in bed;with call  bell/phone within reach;with bed alarm set;with nursing/sitter in room   Nurse Communication Mobility status        Time: 1207-1232 OT Time Calculation (min): 25 min  Charges: OT General Charges $OT Visit: 1 Visit OT Treatments $Self Care/Home Management : 8-22 mins $Therapeutic Activity: 8-22 mins  Chancy Milroy, OT Acute Rehabilitation Services Pager [phone] Office [phone]     Chancy Milroy 03/18/2019, 3:30 PM

## 2019-03-18 NOTE — Progress Notes (Signed)
Responded to PIV/PICC removal consult. Pt up in chair working with PT. RN notified VAS Team will return at a later time today. RN okay with this

## 2019-03-18 NOTE — Progress Notes (Signed)
Patient arrived to unit from 2 heart. Patient is alert and oriented resting in bed. VS are stable with no pain noted. Called and left Voicemail for Daughter that patient had transferred to 2W. Will continue to monitor.

## 2019-03-19 ENCOUNTER — Inpatient Hospital Stay (HOSPITAL_COMMUNITY): Payer: Medicaid Other

## 2019-03-19 LAB — BASIC METABOLIC PANEL
Anion gap: 14 (ref 5–15)
BUN: 54 mg/dL — ABNORMAL HIGH (ref 8–23)
CO2: 28 mmol/L (ref 22–32)
Calcium: 8.9 mg/dL (ref 8.9–10.3)
Chloride: 105 mmol/L (ref 98–111)
Creatinine, Ser: 1.29 mg/dL — ABNORMAL HIGH (ref 0.44–1.00)
GFR calc Af Amer: 48 mL/min — ABNORMAL LOW (ref >60)
GFR calc non Af Amer: 41 mL/min — ABNORMAL LOW (ref >60)
Glucose, Bld: 246 mg/dL — ABNORMAL HIGH (ref 70–99)
Potassium: 4.7 mmol/L (ref 3.5–5.1)
Sodium: 147 mmol/L — ABNORMAL HIGH (ref 135–145)

## 2019-03-19 LAB — CBC
HCT: 31.4 % — ABNORMAL LOW (ref 36.0–46.0)
Hemoglobin: 9.1 g/dL — ABNORMAL LOW (ref 12.0–15.0)
MCH: 26.1 pg (ref 26.0–34.0)
MCHC: 29 g/dL — ABNORMAL LOW (ref 30.0–36.0)
MCV: 90.2 fL (ref 80.0–100.0)
Platelets: 282 10*3/uL (ref 150–400)
RBC: 3.48 MIL/uL — ABNORMAL LOW (ref 3.87–5.11)
RDW: 17 % — ABNORMAL HIGH (ref 11.5–15.5)
WBC: 8.4 10*3/uL (ref 4.0–10.5)
nRBC: 0 % (ref 0.0–0.2)

## 2019-03-19 LAB — GLUCOSE, CAPILLARY
Glucose-Capillary: 181 mg/dL — ABNORMAL HIGH (ref 70–99)
Glucose-Capillary: 195 mg/dL — ABNORMAL HIGH (ref 70–99)
Glucose-Capillary: 187 mg/dL — ABNORMAL HIGH (ref 70–99)
Glucose-Capillary: 192 mg/dL — ABNORMAL HIGH (ref 70–99)
Glucose-Capillary: 227 mg/dL — ABNORMAL HIGH (ref 70–99)

## 2019-03-19 MED ORDER — DM-GUAIFENESIN ER 30-600 MG PO TB12
1.0000 | ORAL_TABLET | Freq: Two times a day (BID) | ORAL | Status: DC
  Administered 2019-03-19: 1 via ORAL
  Filled 2019-03-19: qty 1

## 2019-03-19 MED ORDER — DM-GUAIFENESIN ER 30-600 MG PO TB12
1.0000 | ORAL_TABLET | Freq: Two times a day (BID) | ORAL | Status: AC
  Administered 2019-03-20 – 2019-03-22 (×6): 1 via ORAL
  Filled 2019-03-19 (×6): qty 1

## 2019-03-19 MED ORDER — FREE WATER
250.0000 mL | Freq: Three times a day (TID) | Status: DC
  Administered 2019-03-19 (×2): 250 mL

## 2019-03-19 MED ORDER — RESOURCE THICKENUP CLEAR PO POWD
ORAL | Status: DC | PRN
  Filled 2019-03-19 (×3): qty 125

## 2019-03-19 NOTE — Progress Notes (Signed)
PROGRESS NOTE    Gabrielle Wright  IEP:[id number] DOB: 1946-10-24 DOA: 02/03/2019 PCP: Rudene Anda, MD   Brief Narrative: 73 year old female PMHxfrom [place].HTN , CAD, DM Type 2? Noted to have a high fever 103.9. Reported to have cough. Taken to [facility] per family. Was supported to have a urinary tract infection although her urine was clean. She never filled her prescription for antibiotics. Due to persistent hypotension and refractory nature of her hypertension she is transferred to [facility] 02/04/2019. She was in respiratory distress for abdominal chest wall paradoxus. She required intubation and central line placement. She had maxed out on peripheral IV vasopressors therefore central line was placed. Pulmonary critical care will be responsible for care at this time. She was placed in isolation since she came from a 19 entry of [place] with recent reports of coronavirus and that country. Note [place] is not on the list of infected countries. Infectious disease was called and they reported again diagnosis done on 1 reported countries.  4/21: Patient has done quite well on trach collar for over 24 hours and has had no further anxiety noted.  She is stable for transfer to progressive care at this point in time.  It does appear that further speech/swallow evaluation will be required with likely MBS at which point PEG can be considered.  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 Tolerating trach collar overnight.  With anxiety. -Secondary to ischemic cardiomyopathy and pulmonary edema CCM following.  Ischemic cardiomyopathy acute on chronic systolic and diastolic heart failure exacerbation: Echocardiogram with ejection fraction 35 to 40%, left diffuse hypokinesis. Underwent cardiac cath with no targets for revascularization. Cardiology recommending ACE, spironolactone and Lasix. Creatinine up today, will hold IV Lasix today and spironolactone and resume tomorrow as needed.  A. fib with RVR resolved: Not on anticoagulation per cardiology drop in hemoglobin.  Hypernatremia: She received IV Lasix today we will hold Lasix for tomorrow.  Will increase free water.  Diabetes type 2 with complication: Continue with Lantus, sliding scale insulin.  Agitation anxiety On clonazepam Continue with Zoloft .  Dysphagia Has NG tube.  For modified barium swallow today.    Nutrition Problem: Inadequate oral intake Etiology: inability to eat    Signs/Symptoms: NPO status    Interventions: Tube feeding  Estimated body mass index is 26.12 kg/m as calculated from the following:   Height as of this encounter: 5' 6"  (1.676 m).   Weight as of this encounter: 73.4 kg.   DVT prophylaxis: SCDs Code Status: Full code Family Communication Disposition Plan: Remain in the hospital are will need a skilled facility placement  Consultants:   CCM  Cardiology   Procedures:   Cath  Trach   Antimicrobials:      Subjective: She is complaining of some headache.  She is going for the modified diarrhea.  She appears in no distress.  Objective: Vitals:   03/19/19 0815 03/19/19  1203 03/19/19 1509 03/19/19 1604  BP:  115/66  104/65  Pulse: 83 77 83 75  Resp: 16 16 17    Temp:  98.2 F (36.8 C)    TempSrc:  Oral    SpO2: 100% 94% 98%   Weight:      Height:        Intake/Output  Summary (Last 24 hours) at 03/19/2019 1709 Last data filed at 03/19/2019 0954 Gross per 24 hour  Intake 810 ml  Output 200 ml  Net 610 ml   Filed Weights   03/17/19 0336 03/18/19 0500 03/19/19 0410  Weight: 73.9 kg 72 kg 73.4 kg    Examination:  General exam: Appears calm and comfortable  Respiratory system: Trach in place, mild secretions. Cardiovascular system: S1 & S2 heard, RRR. No JVD, murmurs, rubs, gallops or clicks. No pedal edema. Gastrointestinal system: Abdomen is nondistended, soft and nontender. No organomegaly or masses felt. Normal bowel sounds heard. Central nervous system: Alert  Extremities: Symmetric 5 x 5 power. Skin: No rashes, lesions or ulcers    Data Reviewed: I have personally reviewed following labs and imaging studies  CBC: Recent Labs  Lab 03/15/19 0404 03/16/19 0353 03/17/19 0620 03/18/19 0205 03/19/19 0535  WBC 8.9 8.3 7.7 9.3 8.4  HGB 8.2* 7.7* 7.9* 8.8* 9.1*  HCT 28.1* 27.4* 28.5* 31.5* 31.4*  MCV 90.9 91.9 90.5 90.8 90.2  PLT 273 260 267 280 092   Basic Metabolic Panel: Recent Labs  Lab 03/14/19 0425 03/15/19 0404 03/16/19 0353 03/17/19 0620 03/18/19 0205 03/19/19 0535  NA 143 143 144 146* 148* 147*  K 4.3 4.1 4.2 4.0 4.2 4.7  CL 106 106 105 106 109 105  CO2 31 27 27  33* 30 28  GLUCOSE 187* 222* 269* 148* 212* 246*  BUN 46* 49* 50* 43* 48* 54*  CREATININE 1.20* 1.23* 1.29* 1.17* 1.08* 1.29*  CALCIUM 8.5* 8.3* 8.3* 8.6* 8.9 8.9  MG 2.9* 2.9* 2.9* 2.7* 2.9*  --    GFR: Estimated Creatinine Clearance: 40.4 mL/min (A) (by C-G formula based on SCr of 1.29 mg/dL (H)). Liver Function Tests: No results for input(s): AST, ALT, ALKPHOS, BILITOT, PROT, ALBUMIN in the last 168 hours. No results for input(s): LIPASE, AMYLASE in the last 168 hours. No results for input(s): AMMONIA in the last 168 hours. Coagulation Profile: No results for input(s): INR, PROTIME in the last 168 hours. Cardiac Enzymes: No results for input(s):  CKTOTAL, CKMB, CKMBINDEX, TROPONINI in the last 168 hours. BNP (last 3 results) No results for input(s): PROBNP in the last 8760 hours. HbA1C: No results for input(s): HGBA1C in the last 72 hours. CBG: Recent Labs  Lab 03/18/19 2330 03/19/19 0356 03/19/19 0734 03/19/19 1127 03/19/19 1551  GLUCAP 187* 181* 195* 187* 192*   Lipid Profile: No results for input(s): CHOL, HDL, LDLCALC, TRIG, CHOLHDL, LDLDIRECT in the last 72 hours. Thyroid Function Tests: No results for input(s): TSH, T4TOTAL, FREET4, T3FREE, THYROIDAB in the last 72 hours. Anemia Panel: No results for input(s): VITAMINB12, FOLATE, FERRITIN, TIBC, IRON, RETICCTPCT in the last 72 hours. Sepsis Labs: No results for input(s): PROCALCITON, LATICACIDVEN in the last 168 hours.  Recent Results (from the past 240 hour(s))  Culture, respiratory (non-expectorated)     Status: None   Collection Time: 03/12/19  9:25 AM  Result Value Ref Range Status   Specimen Description TRACHEAL ASPIRATE  Final   Special Requests NONE  Final   Gram Stain NO WBC SEEN RARE GRAM POSITIVE COCCI IN PAIRS  Final   Culture   Final    Consistent with normal respiratory flora. Performed at [facility], [place] [address] [place], [place] [address]    Report Status 03/14/2019 FINAL  Final  Culture, respiratory (non-expectorated)     Status: None   Collection Time: 03/15/19  9:00 AM  Result Value Ref Range Status   Specimen Description TRACHEAL ASPIRATE  Final   Special Requests NONE  Final   Gram Stain   Final    RARE WBC PRESENT, PREDOMINANTLY PMN FEW GRAM POSITIVE RODS FEW GRAM POSITIVE COCCI IN PAIRS RARE GRAM NEGATIVE RODS    Culture   Final    FEW Consistent with normal respiratory flora. Performed at [facility], [place] [address] [place], [place] [address]    Report Status 03/17/2019 FINAL  Final         Radiology Studies: Dg Abd Portable 1v  Result Date: 03/18/2019 CLINICAL DATA:  Feeding tube placed EXAM:  PORTABLE ABDOMEN - 1 VIEW COMPARISON:  None. FINDINGS: Feeding tube with the tip projecting over the antrum of the stomach. There is no bowel dilatation to suggest obstruction. There is no evidence of pneumoperitoneum, portal venous gas or pneumatosis. There are no pathologic calcifications along the expected course of the ureters. The osseous structures are unremarkable. IMPRESSION: Feeding tube with the tip projecting over the antrum of the stomach. Electronically Signed   By: Kathreen Devoid   On: 03/18/2019 12:48        Scheduled Meds: . sodium chloride   Intravenous Once  . amiodarone  200 mg Per Tube Daily  . aspirin  81 mg Per Tube Daily  . atorvastatin  40 mg Per Tube q1800  . carvedilol  3.125 mg Per NG tube BID WC  . chlorhexidine gluconate (MEDLINE KIT)  15 mL Mouth Rinse BID  . Chlorhexidine Gluconate Cloth  6 each Topical Q0600  . dextromethorphan-guaiFENesin  1 tablet Oral BID  . feeding supplement (GLUCERNA 1.5 CAL)  1,000 mL Per Tube Q24H  . free water  250 mL Per Tube Q8H  . hydrALAZINE  10 mg Per Tube Q8H  . insulin aspart  0-24 Units Subcutaneous Q4H  . insulin glargine  25 Units Subcutaneous QHS  . isosorbide dinitrate  10 mg Per Tube TID  . losartan  25 mg Per Tube Daily  . mouth rinse  15 mL Mouth Rinse 10 times per day  . montelukast  10 mg Oral QHS  . multivitamin  15 mL Per Tube Daily  . pantoprazole (PROTONIX) IV  40 mg Intravenous Q12H  . polyethylene glycol  17 g Oral BID  . sertraline  50 mg Per Tube Daily  . sodium chloride flush  10-40 mL Intracatheter Q12H  . traZODone  50 mg Per Tube QHS   Continuous Infusions: . sodium chloride Stopped (03/04/19 0815)     LOS: 43 days    Time spent: 35 minutes.     Elmarie Shiley, MD [facility] Pager [phone] If 7PM-7AM, please contact night-coverage [email or web address] Password [facility] 03/19/2019, 5:09 PM

## 2019-03-19 NOTE — Progress Notes (Signed)
Alert and oriented x 4. Barium swallow completed earlier today. Tolerating thickened fluids. Encouraged to cough and deep breathe. O2 SAT 100% on 10 liters trach collar. Incentive spirometer teaching needs reinforcement. Call bell within reach. Encouraged to report signs and symptoms to staff.

## 2019-03-19 NOTE — Progress Notes (Signed)
NAME:  Gabrielle Wright, MRN:  [id number], DOB:  Jan 02, 1946, LOS: 43 ADMISSION DATE:  02/03/2019, CONSULTATION DATE: 02/04/2019 REFERRING MD: Emergency department physician CHIEF COMPLAINT: Fever respiratory failure  Brief History   73 year old elderly woman visiting from [place], admitted 3/10 with fevers, hypotension, found to have group A strep bacteremia, required mechanical ventilation Course complicated by non-STEMI and new LV dysfunction ? Ischemic vs Takatsubo's Failed extubation 3/18 due to acute pulmonary edema. S/p trach 3/30.  Past Medical History  Hypertension Coronary artery disease Suspected diabetes.  Significant Hospital Is Events   02/04/2019 transfer from [facility] to [facility] required intubation and pressors. 3/16 off levophed 3/18 extubated briefly but reintubated due to pulmonary edema 3/19 started milrinone 3/20 Episode of respiratory distress,  Precedex changed to propofol Atrial fibrillation RVR >>amiodarone bolus >> sinus rhythm 3/21 rising creat , AF-RVR >> amio bolus 3/24:rising creatinine 3/30: reintubated 3/31 trach 4/1 PICC  Consults:  02/04/2019 ID  IR  Cardiology  Procedures:  ETT 3/10 > 3/18, 3/18 > 3/31 Trach 3/31 > CVL 3/10 > 4/3 PICC 4/1 >   Significant Diagnostic Tests:  Echo 3/15 decreased EF 35 to 40%, LVEDP 27, no significant MR Cardiac cath >> LVEDP 40, RCA totally occluded, patent OM1 stent but distally occluded, apical LAD diffusely diseased, no intervention Chest x-ray 3/22  bilateral lower lobe airspace disease versus layering effusions CXR 3/23> pulmonary edema and bilateral pleural effusions. Echo 3/26 limited> EF 35-40%, elevated LA and LV end-diastolic pressures, Diffuse hypokinesis of LV CXR 4/1> bibasilar opacities, small R pleural effusion, tracheostomy tube present and appropriately positioned  CXR 4/9> bilateral infiltrates with R pleural effusion CXR 4/14> possible left sided infiltrate with a small  effusion, right sided atelectasis CXR 4/7> L>R airspace disease, atelectasis vs pna; inc congestion  Micro Data:  02/04/2019 blood cultures x2>> Gr A strep 02/04/2019 sputum culture>> neg 02/04/2019 urine culture>>neg 02/04/2019 respiratory virus panel>> rhinovirus 02/04/2019 flu a and B>> neg coronavirus testing 02/04/2019>>neg resp 3/21 >> Klebsiella oxytoca, co-ag negative staph Resp 4/15 and 4/18>> neg  Antimicrobials:  02/04/2019 vancomycin>>3/ 10 02/04/2019 Zosyn>> 3/10 Pen G 3/10 >> 3/22 (plan) Ceftx 3/20 >>3/24 Cipro 3/24>> 3/31  Interim history/subjective:  Tolerated trach collar >48 hours. Completed barium swallow and able to start dysphagia diet. Continues to have thicksecretions per nursing  Objective   Blood pressure 117/65, pulse 83, temperature (!) 97.5 F (36.4 C), temperature source Axillary, resp. rate 16, height 5\' 6"  (1.676 m), weight 73.4 kg, SpO2 100 %.    FiO2 (%):  [28 %-35 %] 28 %   Intake/Output Summary (Last 24 hours) at 03/19/2019 1155 Last data filed at 03/19/2019 0954 Gross per 24 hour  Intake 1130 ml  Output 600 ml  Net 530 ml   Filed Weights   03/17/19 0336 03/18/19 0500 03/19/19 0410  Weight: 73.9 kg 72 kg 73.4 kg   Physical Exam: General: Chronically ill-appearing, no acute distress HENT: [place], AT, OP clear, MMM Neck: Trach in place, c/d/i Eyes: EOMI, no scleral icterus Respiratory: Clear to auscultation bilaterally.  No crackles, wheezing or rales Cardiovascular: RRR, -M/R/G, no JVD GI: BS+, soft, nontender Extremities:-Edema,-tenderness Neuro: Awake and following commands  Assessment & Plan:   Chronic respiratory failure s/p trach  Placed on 3/31. Tolerating trach collar  -Routine trach care -Schedule mucinex x 3d followed by PRN -PRN duonebs -CPT  Dysphagia -Dysphagia diet  Atrial fibrillation Ischemic CM Anemia DM2 -Per primary  GI ppx: Protonix DVT ppx: SCDs  Diet:  Enteral Nutrition Code Status: full   Mechele Collin,  M.D. [facility] Pulmonary/Critical Care Medicine Pager: [phone] After hours pager: [phone]

## 2019-03-19 NOTE — Progress Notes (Signed)
Physical Therapy Treatment Patient Details Name: Gabrielle Wright MRN: [id number] DOB: 22-Jul-1946 Today's Date: 03/19/2019    History of Present Illness 73 y.o. female admitted on 02/03/19 for fever and cough (to [facility]) and was transferred to [facility] on 02/04/19 for hypotension and respiratory distress (acute respiratory failure/pulmonary edema) requiring intubation 3/10-current (time of PT eval 02/27/2019).  She was visiting family from [place] and COVID 19 tests were negative.  She was found to have group A strep bacteremia (septic shiock), rhinovirus,  and course complicated by NSTEMI and new LV dysfunction s/p cardiac cath on 02/09/19.  Cardiology following.  Other dx include ischemic cardiomyopathy, hyperkalemia, DM2- uncontrolled, and abdominal distention.  Pt with significant PMH of DM, HTN, anc CAD. Trach placed on 3/31    PT Comments    Pt with improved ambulation tolerance this date with maximal encouragement. Pt able to tolerate 3 bouts of 12' with RW. Pt spO2 >90% on RA t/o session, pt with SOB but recovered quickly. Acute PT to cont to follow.   Follow Up Recommendations  SNF     Equipment Recommendations  (TBD)    Recommendations for Other Services       Precautions / Restrictions Precautions Precautions: Fall Precaution Comments: trach, on 28%  Restrictions Weight Bearing Restrictions: No    Mobility  Bed Mobility               General bed mobility comments: pt up in chair upon PT arrival  Transfers Overall transfer level: Needs assistance Equipment used: Rolling walker (2 wheeled) Transfers: Sit to/from Stand Sit to Stand: Mod assist         General transfer comment: v/c's to push up from arm rests not pull up on walker, completed 4 sit to stands  Ambulation/Gait Ambulation/Gait assistance: Min assist;+2 safety/equipment Gait Distance (Feet): 12 Feet(x3) Assistive device: Rolling walker (2 wheeled) Gait Pattern/deviations: Step-through  pattern;Decreased stride length Gait velocity: dec Gait velocity interpretation: <1.31 ft/sec, indicative of household ambulator General Gait Details: pt with inc SOB and onset of fatigue limited amb tolerance however much improved from monday's session. Pt completed 3 bouts of 12 feet with max encouragement. Pt SpO2 >90 while on RA. took 2 min rest breaks in between bouts   Stairs             Wheelchair Mobility    Modified Rankin (Stroke Patients Only)       Balance Overall balance assessment: Needs assistance Sitting-balance support: Feet supported;No upper extremity supported Sitting balance-Leahy Scale: Fair Sitting balance - Comments: brief sitting EOB with min guard to min assist staticall y   Standing balance support: Bilateral upper extremity supported;During functional activity Standing balance-Leahy Scale: Poor Standing balance comment: reliant on B UE and external support                             Cognition Arousal/Alertness: Awake/alert Behavior During Therapy: WFL for tasks assessed/performed Overall Cognitive Status: Difficult to assess                                 General Comments: pt perseverating on asking for something to drink despite saying no/shaking head no      Exercises      General Comments        Pertinent Vitals/Pain Pain Assessment: Faces Faces Pain Scale: No hurt Pain Location: headache Pain Intervention(s):  Limited activity within patient's tolerance    Home Living                      Prior Function            PT Goals (current goals can now be found in the care plan section) Progress towards PT goals: Progressing toward goals    Frequency    Min 3X/week      PT Plan Current plan remains appropriate;Frequency needs to be updated    Co-evaluation              AM-PAC PT "6 Clicks" Mobility   Outcome Measure  Help needed turning from your back to your side while in a  flat bed without using bedrails?: A Lot Help needed moving from lying on your back to sitting on the side of a flat bed without using bedrails?: A Lot Help needed moving to and from a bed to a chair (including a wheelchair)?: A Lot Help needed standing up from a chair using your arms (e.g., wheelchair or bedside chair)?: A Little Help needed to walk in hospital room?: A Little Help needed climbing 3-5 steps with a railing? : Total 6 Click Score: 13    End of Session Equipment Utilized During Treatment: Gait belt Activity Tolerance: Patient limited by fatigue Patient left: in chair;with call bell/phone within reach;with nursing/sitter in room   PT Visit Diagnosis: Muscle weakness (generalized) (M62.81);Other abnormalities of gait and mobility (R26.89)     Time: 0940-1005 PT Time Calculation (min) (ACUTE ONLY): 25 min  Charges:  $Gait Training: 23-37 mins                     Lewis Shock, PT, DPT Acute Rehabilitation Services Pager #: [phone] Office #: [phone]    Iona Hansen 03/19/2019, 10:38 AM

## 2019-03-19 NOTE — Progress Notes (Signed)
Inpatient Diabetes Program Recommendations  AACE/ADA: New Consensus Statement on Inpatient Glycemic Control (2015)  Target Ranges:  Prepandial:   less than 140 mg/dL      Peak postprandial:   less than 180 mg/dL (1-2 hours)      Critically ill patients:  140 - 180 mg/dL   Lab Results  Component Value Date   GLUCAP 187 (H) 03/19/2019   HGBA1C 10.1 (H) 02/04/2019    Review of Glycemic Control Results for Gabrielle Wright, Gabrielle Wright (MRN [id number]) as of 03/19/2019 12:15  Ref. Range 03/18/2019 23:30 03/19/2019 03:56 03/19/2019 07:34 03/19/2019 11:27  Glucose-Capillary Latest Ref Range: 70 - 99 mg/dL 527 (H) 782 (H) 423 (H) 187 (H)   Home DM Meds: Humulin 70/30 Insulin: 28-30 units BID                             Regular Insulin 10 units with Lunch                             Tradjenta 2.5 mg Daily                             Metformin 500 mg Daily  Current Orders: Novolog 0-24 units Q4 hours, Lantus 25 units QHS    Getting Glucerna Tube feeds at 50cc/hr.    MD- Please consider:  -Increasing Lantus to 30 units QHS. - Adding Novolog 3 units Q4H for tube feed coverage. To be stopped if tube feeds are stopped or held. - Changing correction to Novolog 0-15 units Q4H under Glycemic Control Order set.   Thanks, Lujean Rave, MSN, RNC-OB Diabetes Coordinator [phone] (8a-5p)

## 2019-03-19 NOTE — Progress Notes (Signed)
Modified Barium Swallow Progress Note  Patient Details  Name: Saniyya Neri MRN: [id number] Date of Birth: 05/14/1946  Today's Date: 03/19/2019  Modified Barium Swallow completed with teleinterpeter used ([employer] [phone]).  Full report located under Chart Review in the Imaging Section.  Brief recommendations include the following:  Clinical Impression  Pt has a mild oropharyngeal dysphagia felt to be secondary to prolonged and repeated intubations and overall deconditioning. She has reduced lingual manipulation and slow, incomplete oral clearance, with lingual residue increasing as boluses become more solid. Base of tongue retraction, hyolaryngeal movement, and epiglottic inversion are all reduced. This results in decreased laryngeal vestibule closure, and pt has aspiration before and during the swallow with thin and nectar thick liquids that cannot be cleared fully given intermittent spontaneous coughing. Aforementioned weakness also results in trace vallecular residue across consistencies but also with the barium tablet remaining lodged in the valleculae, requiring multiple pureed and honey thick liquid boluses to clear. Considering the above as well as her mentation and language barrier, limited compensatory strategies were attempted (although also not found to be effective). Recommend starting Dys 2 diet and honey thick liquids with PMV in place and full supervision provided. Will f/u for tolerance and hopeful improvement given additional therapy and time post-extubation.   Swallow Evaluation Recommendations       SLP Diet Recommendations: Dysphagia 2 (Fine chop) solids;Honey thick liquids   Liquid Administration via: Cup;Straw   Medication Administration: Crushed with puree   Supervision: Staff to assist with self feeding;Full supervision/cueing for compensatory strategies   Compensations: Slow rate;Small sips/bites   Postural Changes: Seated upright at 90 degrees   Oral Care  Recommendations: Oral care BID   Other Recommendations: Order thickener from pharmacy;Prohibited food (jello, ice cream, thin soups);Remove water pitcher;Place PMSV during PO intake    [name] Vannary Greening 03/19/2019,11:47 AM   [name], M.A. CCC-SLP Acute Herbalist [phone] Office [phone]

## 2019-03-20 DIAGNOSIS — R5381 Other malaise: Secondary | ICD-10-CM

## 2019-03-20 LAB — GLUCOSE, CAPILLARY
Glucose-Capillary: 106 mg/dL — ABNORMAL HIGH (ref 70–99)
Glucose-Capillary: 135 mg/dL — ABNORMAL HIGH (ref 70–99)
Glucose-Capillary: 170 mg/dL — ABNORMAL HIGH (ref 70–99)
Glucose-Capillary: 243 mg/dL — ABNORMAL HIGH (ref 70–99)
Glucose-Capillary: 245 mg/dL — ABNORMAL HIGH (ref 70–99)
Glucose-Capillary: 88 mg/dL (ref 70–99)

## 2019-03-20 LAB — CBC
HCT: 30.8 % — ABNORMAL LOW (ref 36.0–46.0)
Hemoglobin: 9 g/dL — ABNORMAL LOW (ref 12.0–15.0)
MCH: 26 pg (ref 26.0–34.0)
MCHC: 29.2 g/dL — ABNORMAL LOW (ref 30.0–36.0)
MCV: 89 fL (ref 80.0–100.0)
Platelets: 270 10*3/uL (ref 150–400)
RBC: 3.46 MIL/uL — ABNORMAL LOW (ref 3.87–5.11)
RDW: 16.9 % — ABNORMAL HIGH (ref 11.5–15.5)
WBC: 11 10*3/uL — ABNORMAL HIGH (ref 4.0–10.5)
nRBC: 0 % (ref 0.0–0.2)

## 2019-03-20 LAB — BASIC METABOLIC PANEL
Anion gap: 11 (ref 5–15)
BUN: 52 mg/dL — ABNORMAL HIGH (ref 8–23)
CO2: 31 mmol/L (ref 22–32)
Calcium: 9.1 mg/dL (ref 8.9–10.3)
Chloride: 105 mmol/L (ref 98–111)
Creatinine, Ser: 1.28 mg/dL — ABNORMAL HIGH (ref 0.44–1.00)
GFR calc Af Amer: 48 mL/min — ABNORMAL LOW (ref >60)
GFR calc non Af Amer: 42 mL/min — ABNORMAL LOW (ref >60)
Glucose, Bld: 81 mg/dL (ref 70–99)
Potassium: 4.3 mmol/L (ref 3.5–5.1)
Sodium: 147 mmol/L — ABNORMAL HIGH (ref 135–145)

## 2019-03-20 MED ORDER — LORAZEPAM 2 MG/ML IJ SOLN
0.5000 mg | INTRAMUSCULAR | Status: DC | PRN
  Administered 2019-03-20 – 2019-03-21 (×2): 0.5 mg via INTRAVENOUS
  Filled 2019-03-20: qty 1

## 2019-03-20 MED ORDER — PANTOPRAZOLE SODIUM 40 MG PO PACK
40.0000 mg | PACK | Freq: Two times a day (BID) | ORAL | Status: DC
  Administered 2019-03-20 – 2019-03-21 (×2): 40 mg
  Filled 2019-03-20 (×2): qty 20

## 2019-03-20 NOTE — Progress Notes (Signed)
[facility] notified that patient accidentally pulled out NGT when attempting to get back to bed earlier.  Patient able to take PO meds crushed in puree and tolerate 1 cup of honey thinkened water .   Plan to leave NGT out at this time, will continue to assess PO intake as patient passed Barium swallow on 4/22.

## 2019-03-20 NOTE — Progress Notes (Signed)
  Speech Language Pathology Treatment: Dysphagia;Passy Muir Speaking valve  Patient Details Name: Gabrielle Wright MRN: [id number] DOB: 10-08-1946 Today's Date: 03/20/2019 Time: 1200-1240 SLP Time Calculation (min) (ACUTE ONLY): 40 min  Assessment / Plan / Recommendation Clinical Impression  Pt seen with PMSV in place, taking sips of honey thick liquids, conversing with SLP and MD via video interpreter and directly with daughter on the phone. The pt is able to participate at conversation level though vocal quality is significantly breathy and hoarse. Pt is making increased effort to redirect air to upper airway and endorses mild discomfort with PMSV use and prefers to not have it on. Presence of deflated cuff and size six shiley restrict free movement of air to upper airway.  Reinforced to pt multiple times during the session that she must wear her PMSV if she is going to drink honey thick liquids. During sips there is a slightly wet audible respiration, suspect mild residue as reported in MBS, pt able to use a cued cough to clear. Discussed results of MBS and PMSV use with MD, pt, RN and daughter. Pt may use PMSV with close intermittent supervision given her desire to constantly drink water or tea. Will discuss trach change with CCM.    HPI HPI: 73 y.o. female admitted on 02/03/19 for fever and cough (to [facility]) and was transferred to [facility] on 02/04/19 for hypotension and respiratory distress (acute respiratory failure/pulmonary edema) requiring intubation 3/10.  She was visiting family from [place] and COVID 19 tests were negative.  She was found to have group A strep bacteremia (septic shiock), rhinovirus,  and course complicated by NSTEMI and new LV dysfunction s/p cardiac cath on 02/09/19.  Other dx include ischemic cardiomyopathy, hyperkalemia, DM2- uncontrolled, and abdominal distention.  Pt with significant PMH of DM, HTN, anc CAD. Trach placed on 3/31      SLP Plan  Continue with  current plan of care       Recommendations         Patient may use Passy-Muir Speech Valve: Intermittently with supervision;During all therapies with supervision;Caregiver trained to provide supervision;During PO intake/meals PMSV Supervision: Full MD: Please consider changing trach tube to : Cuffless;Smaller size         General recommendations: Rehab consult Oral Care Recommendations: Oral care BID Follow up Recommendations: Inpatient Rehab Plan: Continue with current plan of care       GO               Harlon Ditty, MA CCC-SLP  Acute Rehabilitation Services Pager [phone] Office [phone]  Claudine Mouton 03/20/2019, 12:49 PM

## 2019-03-20 NOTE — Progress Notes (Signed)
PROGRESS NOTE    Gabrielle Wright  ZJI:[id number] DOB: 30-Sep-1946 DOA: 02/03/2019 PCP: Rudene Anda, MD   Brief Narrative: 73 year old female PMHxfrom [place].HTN , CAD, DM Type 2? Noted to have a high fever 103.9. Reported to have cough. Taken to [place] [facility] per family. Was supported to have a urinary tract infection although her urine was clean. She never filled her prescription for antibiotics. Due to persistent hypotension and refractory nature of her hypertension she is transferred to [facility] 02/04/2019. She was in respiratory distress for abdominal chest wall paradoxus. She required intubation and central line placement. She had maxed out on peripheral IV vasopressors therefore central line was placed. Pulmonary critical care will be responsible for care at this time. She was placed in isolation since she came from  [place] with recent reports of coronavirus and that country. . Infectious disease was called and they reported again diagnosis done on 1 reported countries.  4/21: Patient has done quite well on trach collar for over 24 hours and has had no further anxiety noted.  She was  transfer to progressive care. Patient pass swallow evaluation. She removed NG tube.  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  -At this post trach on 3/30 first. -Tolerating trach collar. 28 % 5 L.  -Having some thick secretion.  She was stared on Mucinex. CCM will follow on 4-24 to evaluate need to change cuff.  -Secondary to ischemic  cardiomyopathy and pulmonary edema -CCM following. -holding on lasix due to hypernatremia and increase cr.   Ischemic cardiomyopathy acute on chronic systolic and diastolic heart failure exacerbation: Echocardiogram with ejection fraction 35 to 40%, left diffuse hypokinesis. Underwent cardiac cath with no targets for revascularization. Cardiology recommending ACE, spironolactone and Lasix. Creatinine up today, will hold IV Lasix today and spironolactone and resume tomorrow as needed. Continue with cozaar. Monitor renal function.   A. fib with RVR resolved: Not on anticoagulation per cardiology drop in hemoglobin.  Hypernatremia: hold lasix today. Repeat labs in am.   Diabetes type 2 with complication: Continue with Lantus, sliding scale insulin.  Agitation anxiety On clonazepam PRN Continue with Zoloft .  Dysphagia Pass swallow evaluation.  On dysphagia diet 2.      Nutrition Problem: Inadequate oral intake Etiology: inability to eat    Signs/Symptoms: NPO status    Interventions: Tube feeding  Estimated body mass index is 26.12 kg/m as calculated from the following:   Height as of this encounter: 5' 6"  (1.676 m).   Weight as of this encounter: 73.4 kg.   DVT prophylaxis: SCDs Code Status: Full code Family Communication Disposition Plan: CIR evaluation for admission.   Consultants:   CCM  Cardiology   Procedures:   Cath  Trach   Antimicrobials:      Subjective: She is anxious. Use translator.  Report palpitation but HR is normal on telemetry.   Objective: Vitals:   03/20/19 1751 03/20/19 0258 03/20/19 5277  03/20/19 1443  BP: (!) 106/56  117/60 115/63  Pulse: 83 87 81 83  Resp: 15 13 12 20   Temp:   98.6 F (37 C)   TempSrc:   Oral   SpO2: 100%  100% 100%  Weight:      Height:        Intake/Output Summary (Last 24 hours)  at 03/20/2019 1523 Last data filed at 03/20/2019 0300 Gross per 24 hour  Intake 370 ml  Output 0 ml  Net 370 ml   Filed Weights   03/17/19 0336 03/18/19 0500 03/19/19 0410  Weight: 73.9 kg 72 kg 73.4 kg    Examination:  General exam: NAD Respiratory system: Trach in place, some secretion.  Cardiovascular system: S 1, S 2 RRR Gastrointestinal system: BS present, soft, nt Central nervous system: Alert , non focal.  Extremities: Symmetric power.  Skin: No rashes, lesions or ulcers    Data Reviewed: I have personally reviewed following labs and imaging studies  CBC: Recent Labs  Lab 03/16/19 0353 03/17/19 0620 03/18/19 0205 03/19/19 0535 03/20/19 0736  WBC 8.3 7.7 9.3 8.4 11.0*  HGB 7.7* 7.9* 8.8* 9.1* 9.0*  HCT 27.4* 28.5* 31.5* 31.4* 30.8*  MCV 91.9 90.5 90.8 90.2 89.0  PLT 260 267 280 282 275   Basic Metabolic Panel: Recent Labs  Lab 03/14/19 0425 03/15/19 0404 03/16/19 0353 03/17/19 0620 03/18/19 0205 03/19/19 0535 03/20/19 0736  NA 143 143 144 146* 148* 147* 147*  K 4.3 4.1 4.2 4.0 4.2 4.7 4.3  CL 106 106 105 106 109 105 105  CO2 31 27 27  33* 30 28 31   GLUCOSE 187* 222* 269* 148* 212* 246* 81  BUN 46* 49* 50* 43* 48* 54* 52*  CREATININE 1.20* 1.23* 1.29* 1.17* 1.08* 1.29* 1.28*  CALCIUM 8.5* 8.3* 8.3* 8.6* 8.9 8.9 9.1  MG 2.9* 2.9* 2.9* 2.7* 2.9*  --   --    GFR: Estimated Creatinine Clearance: 40.7 mL/min (A) (by C-G formula based on SCr of 1.28 mg/dL (H)). Liver Function Tests: No results for input(s): AST, ALT, ALKPHOS, BILITOT, PROT, ALBUMIN in the last 168 hours. No results for input(s): LIPASE, AMYLASE in the last 168 hours. No results for input(s): AMMONIA in the last 168 hours. Coagulation Profile: No results for input(s): INR, PROTIME in the last 168 hours. Cardiac Enzymes: No results for input(s): CKTOTAL, CKMB, CKMBINDEX, TROPONINI in the last 168 hours. BNP (last 3 results) No results for input(s): PROBNP in the last 8760 hours.  HbA1C: No results for input(s): HGBA1C in the last 72 hours. CBG: Recent Labs  Lab 03/19/19 1942 03/20/19 0012 03/20/19 0340 03/20/19 0853 03/20/19 1211  GLUCAP 227* 243* 135* 88 170*   Lipid Profile: No results for input(s): CHOL, HDL, LDLCALC, TRIG, CHOLHDL, LDLDIRECT in the last 72 hours. Thyroid Function Tests: No results for input(s): TSH, T4TOTAL, FREET4, T3FREE, THYROIDAB in the last 72 hours. Anemia Panel: No results for input(s): VITAMINB12, FOLATE, FERRITIN, TIBC, IRON, RETICCTPCT in the last 72 hours. Sepsis Labs: No results for input(s): PROCALCITON, LATICACIDVEN in the last 168 hours.  Recent Results (from the past 240 hour(s))  Culture, respiratory (non-expectorated)     Status: None   Collection Time: 03/12/19  9:25 AM  Result Value Ref Range Status   Specimen Description TRACHEAL ASPIRATE  Final   Special Requests NONE  Final   Gram Stain NO WBC SEEN RARE GRAM POSITIVE COCCI IN PAIRS   Final   Culture   Final  Consistent with normal respiratory flora. Performed at [facility], [place] [address], [place], [place] [address]    Report Status 03/14/2019 FINAL  Final  Culture, respiratory (non-expectorated)     Status: None   Collection Time: 03/15/19  9:00 AM  Result Value Ref Range Status   Specimen Description TRACHEAL ASPIRATE  Final   Special Requests NONE  Final   Gram Stain   Final    RARE WBC PRESENT, PREDOMINANTLY PMN FEW GRAM POSITIVE RODS FEW GRAM POSITIVE COCCI IN PAIRS RARE GRAM NEGATIVE RODS    Culture   Final    FEW Consistent with normal respiratory flora. Performed at [facility], [place] [address], [place], [place] [address]    Report Status 03/17/2019 FINAL  Final         Radiology Studies: Dg Swallowing Func-speech Pathology  Result Date: 03/19/2019 Objective Swallowing Evaluation: Type of Study: MBS-Modified Barium Swallow Study  Patient Details Name: Gabrielle Wright MRN: [id number] Date of Birth: [birth year] Today's  Date: 03/19/2019 Time: SLP Start Time (ACUTE ONLY): 1030 -SLP Stop Time (ACUTE ONLY): 1050 SLP Time Calculation (min) (ACUTE ONLY): 20 min Past Medical History: Past Medical History: Diagnosis Date . Coronary artery disease  . Diabetes mellitus without complication ([place])  . Hypertension  Past Surgical History: Past Surgical History: Procedure Laterality Date . CORONARY ANGIOPLASTY WITH STENT PLACEMENT   . CORONARY/GRAFT ACUTE MI REVASCULARIZATION N/A 02/09/2019  Procedure: Coronary/Graft Acute MI Revascularization;  Surgeon: Belva Crome, MD;  Location: [place] [facility];  Service: Cardiovascular;  Laterality: N/A; . LEFT HEART CATH AND CORONARY ANGIOGRAPHY N/A 02/09/2019  Procedure: LEFT HEART CATH AND CORONARY ANGIOGRAPHY;  Surgeon: Belva Crome, MD;  Location: [place] [facility];  Service: Cardiovascular;  Laterality: N/A; HPI: 73 y.o. female admitted on 02/03/19 for fever and cough (to [facility]) and was transferred to [facility] on 02/04/19 for hypotension and respiratory distress (acute respiratory failure/pulmonary edema) requiring intubation 3/10.  She was visiting family from [place] and [place] 19 tests were negative.  She was found to have group A strep bacteremia (septic shiock), rhinovirus,  and course complicated by NSTEMI and new LV dysfunction s/p cardiac cath on 02/09/19.  Other dx include ischemic cardiomyopathy, hyperkalemia, DM2- uncontrolled, and abdominal distention.  Pt with significant PMH of DM, HTN, anc CAD. Trach placed on 3/31  Subjective: pt alert, anxious Assessment / Plan / Recommendation CHL IP CLINICAL IMPRESSIONS 03/19/2019 Clinical Impression Pt has a mild oropharyngeal dysphagia felt to be secondary to prolonged and repeated intubations and overall deconditioning. She has reduced lingual manipulation and slow, incomplete oral clearance, with lingual residue increasing as boluses become more solid. Base of tongue retraction, hyolaryngeal movement, and epiglottic inversion are  all reduced. This results in decreased laryngeal vestibule closure, and pt has aspiration before and during the swallow with thin and nectar thick liquids that cannot be cleared fully given intermittent spontaneous coughing. Aforementioned weakness also results in trace vallecular residue across consistencies but also with the barium tablet remaining lodged in the valleculae, requiring multiple pureed and honey thick liquid boluses to clear. Considering the above as well as her mentation and language barrier, limited compensatory strategies were attempted (although also not found to be effective). Recommend starting Dys 2 diet and honey thick liquids with PMV in place and full supervision provided. Will f/u for tolerance and hopeful improvement given additional therapy and time post-extubation. SLP Visit Diagnosis Dysphagia, oropharyngeal phase (R13.12) Attention and concentration deficit following --  Frontal lobe and executive function deficit following -- Impact on safety and function Moderate aspiration risk;Mild aspiration risk   CHL IP TREATMENT RECOMMENDATION 03/19/2019 Treatment Recommendations Therapy as outlined in treatment plan below   Prognosis 03/19/2019 Prognosis for Safe Diet Advancement Good Barriers to Reach Goals -- Barriers/Prognosis Comment -- CHL IP DIET RECOMMENDATION 03/19/2019 SLP Diet Recommendations Dysphagia 2 (Fine chop) solids;Honey thick liquids Liquid Administration via Cup;Straw Medication Administration Crushed with puree Compensations Slow rate;Small sips/bites Postural Changes Seated upright at 90 degrees   CHL IP OTHER RECOMMENDATIONS 03/19/2019 Recommended Consults -- Oral Care Recommendations Oral care BID Other Recommendations Order thickener from pharmacy;Prohibited food (jello, ice cream, thin soups);Remove water pitcher;Place PMSV during PO intake   CHL IP FOLLOW UP RECOMMENDATIONS 03/19/2019 Follow up Recommendations Skilled Nursing facility   [facility] IP FREQUENCY AND DURATION  03/19/2019 Speech Therapy Frequency (ACUTE ONLY) min 2x/week Treatment Duration 2 weeks      CHL IP ORAL PHASE 03/19/2019 Oral Phase Impaired Oral - Pudding Teaspoon -- Oral - Pudding Cup -- Oral - Honey Teaspoon Reduced posterior propulsion;Lingual/palatal residue Oral - Honey Cup Reduced posterior propulsion;Lingual/palatal residue Oral - Nectar Teaspoon Reduced posterior propulsion;Lingual/palatal residue Oral - Nectar Cup -- Oral - Nectar Straw -- Oral - Thin Teaspoon Premature spillage Oral - Thin Cup -- Oral - Thin Straw -- Oral - Puree Reduced posterior propulsion;Lingual/palatal residue Oral - Mech Soft Reduced posterior propulsion;Lingual/palatal residue;Impaired mastication Oral - Regular -- Oral - Multi-Consistency -- Oral - Pill Delayed oral transit Oral Phase - Comment --  CHL IP PHARYNGEAL PHASE 03/19/2019 Pharyngeal Phase Impaired Pharyngeal- Pudding Teaspoon -- Pharyngeal -- Pharyngeal- Pudding Cup -- Pharyngeal -- Pharyngeal- Honey Teaspoon Reduced epiglottic inversion;Reduced anterior laryngeal mobility;Reduced laryngeal elevation;Reduced airway/laryngeal closure;Reduced tongue base retraction;Pharyngeal residue - valleculae Pharyngeal -- Pharyngeal- Honey Cup Reduced epiglottic inversion;Reduced anterior laryngeal mobility;Reduced laryngeal elevation;Reduced airway/laryngeal closure;Reduced tongue base retraction;Pharyngeal residue - valleculae Pharyngeal -- Pharyngeal- Nectar Teaspoon Reduced epiglottic inversion;Reduced anterior laryngeal mobility;Reduced laryngeal elevation;Reduced airway/laryngeal closure;Reduced tongue base retraction;Penetration/Aspiration before swallow;Pharyngeal residue - valleculae Pharyngeal Material enters airway, passes BELOW cords and not ejected out despite cough attempt by patient Pharyngeal- Nectar Cup -- Pharyngeal -- Pharyngeal- Nectar Straw -- Pharyngeal -- Pharyngeal- Thin Teaspoon Reduced epiglottic inversion;Reduced anterior laryngeal mobility;Reduced laryngeal  elevation;Reduced airway/laryngeal closure;Reduced tongue base retraction;Penetration/Aspiration during swallow Pharyngeal Material enters airway, passes BELOW cords and not ejected out despite cough attempt by patient Pharyngeal- Thin Cup -- Pharyngeal -- Pharyngeal- Thin Straw -- Pharyngeal -- Pharyngeal- Puree Reduced epiglottic inversion;Reduced anterior laryngeal mobility;Reduced laryngeal elevation;Reduced airway/laryngeal closure;Reduced tongue base retraction;Pharyngeal residue - valleculae Pharyngeal -- Pharyngeal- Mechanical Soft Reduced epiglottic inversion;Reduced anterior laryngeal mobility;Reduced laryngeal elevation;Reduced airway/laryngeal closure;Reduced tongue base retraction;Pharyngeal residue - valleculae Pharyngeal -- Pharyngeal- Regular -- Pharyngeal -- Pharyngeal- Multi-consistency -- Pharyngeal -- Pharyngeal- Pill Reduced epiglottic inversion;Reduced anterior laryngeal mobility;Reduced laryngeal elevation;Reduced airway/laryngeal closure;Reduced tongue base retraction;Pharyngeal residue - valleculae Pharyngeal -- Pharyngeal Comment --  CHL IP CERVICAL ESOPHAGEAL PHASE 03/19/2019 Cervical Esophageal Phase WFL Pudding Teaspoon -- Pudding Cup -- Honey Teaspoon -- Honey Cup -- Nectar Teaspoon -- Nectar Cup -- Nectar Straw -- Thin Teaspoon -- Thin Cup -- Thin Straw -- Puree -- Mechanical Soft -- Regular -- Multi-consistency -- Pill -- Cervical Esophageal Comment -- Venita Sheffield Nix 03/19/2019, 5:19 PM  Pollyann Glen, M.A. CCC-SLP Acute Rehabilitation Services Pager [phone] Office [phone]                  Scheduled Meds: . sodium chloride   Intravenous Once  . amiodarone  200 mg Per Tube Daily  . aspirin  81 mg Per Tube Daily  . atorvastatin  40 mg Per Tube q1800  . carvedilol  3.125 mg Per NG tube BID WC  . chlorhexidine gluconate (MEDLINE KIT)  15 mL Mouth Rinse BID  . Chlorhexidine Gluconate Cloth  6 each Topical Q0600  . dextromethorphan-guaiFENesin  1 tablet Oral BID  .  feeding supplement (GLUCERNA 1.5 CAL)  1,000 mL Per Tube Q24H  . hydrALAZINE  10 mg Per Tube Q8H  . insulin aspart  0-24 Units Subcutaneous Q4H  . insulin glargine  25 Units Subcutaneous QHS  . isosorbide dinitrate  10 mg Per Tube TID  . losartan  25 mg Per Tube Daily  . mouth rinse  15 mL Mouth Rinse 10 times per day  . montelukast  10 mg Oral QHS  . multivitamin  15 mL Per Tube Daily  . pantoprazole sodium  40 mg Per Tube BID  . polyethylene glycol  17 g Oral BID  . sertraline  50 mg Per Tube Daily  . traZODone  50 mg Per Tube QHS   Continuous Infusions: . sodium chloride Stopped (03/04/19 0815)     LOS: 44 days    Time spent: 35 minutes.     Elmarie Shiley, MD [facility] Pager [phone] If 7PM-7AM, please contact night-coverage [email or web address] Password [facility] 03/20/2019, 3:23 PM

## 2019-03-20 NOTE — Plan of Care (Signed)
Patient with trach, PMV on/off.  Patient wanting it off after 5 min d/t feeling short of breath and unable to cough.  Patient sats remained 95-99% while PMV on.  NGT accidentally removed when patient was returning back to bed from chair. Will keep out at this time, and encourage PO intake.  Language barrier remains difficult

## 2019-03-20 NOTE — Progress Notes (Signed)
Rehab Admissions Coordinator Note:  Patient was screened by Clois Dupes for appropriateness for an Inpatient Acute Rehab Consult per Dr. Sumner Boast request.   At this time, we are recommending Inpatient Rehab consult.  Clois Dupes RN MSN 03/20/2019, 12:53 PM  I can be reached at [phone].

## 2019-03-20 NOTE — Progress Notes (Signed)
The patient is receiving Protonix by the intravenous route.  Based on criteria approved by the Pharmacy and Therapeutics Committee and the Medical Executive Committee, the medication is being converted to the equivalent oral dose form to give via tube.  These criteria include: -No active GI bleeding -Able to tolerate diet of full liquids (or better) or tube feeding -Able to tolerate other medications by the oral or enteral route  If you have any questions about this conversion, please contact the Pharmacy Department.   Thank you.  Noah Delaine, RPh Clinical Pharmacist Pager: [phone] Please check AMION for all [facility] Pharmacy phone numbers After 10:00 PM, call Main Pharmacy [phone] 03/20/2019 2:36 PM

## 2019-03-20 NOTE — Consult Note (Signed)
Physical Medicine and Rehabilitation Consult Reason for Consult: Decreased functional mobility Referring Physician: [facility]   HPI: Gabrielle Wright is a 73 y.o.right handed non-English-speaking female with history of CAD with STEMI/stenting 2018 maintained on Plavix, diabetes mellitus,CKD stage III, hypertension. Per chart review patient is from [place] and was visiting family. Reportedly independent prior to admission. Result 02/03/2019 with fever as well as cough 2 [facility] and was transferred to [facility] 02/04/2019 for hypertension respiratory distress requiring intubation Patient was placed on IV pressors.. Covid 19 test negative. She was found to have group A strep bacteremia with septic shock. 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 Underwent cardiac catheterization 02/09/2019 per Dr. Verdis Prime. Showing 20-30% diffuse Nehring within the proximal LAD stent and diffuse narrowing in the distal LAD apical segment.patient's stent in the mid to distal obtuse marginal with 80-90% stenosis distal to the stent with active thrombus noted and currently maintained on aspirin. Bouts of atrial fibrillation with RVR remained on aspirin therapy.Due to ongoing respiratory failure with hypoxemia tracheostomy tube was placed 02/24/2019 per Dr. Molli Knock. Patient is currently on a dysphagia #2 honey thick liquid diet and speech therapy follow-up evaluating swallow as well as possible need for gastrostomy tube for nutritional support. Acute on chronic anemia 9.0 and monitored. Therapy evaluations have been completed. M.D. has requested physical medicine rehabilitation consult.  Used Bengali interpreter via iPad, because of tracheostomy and low vocal volume it was difficult to interpret clearly at times  Review of Systems  Unable to perform ROS: Language   Past Medical History:    Diagnosis Date  . Coronary artery disease   . Diabetes mellitus without complication (HCC)   . Hypertension    Past Surgical History:  Procedure Laterality Date  . CORONARY ANGIOPLASTY WITH STENT PLACEMENT    . CORONARY/GRAFT ACUTE MI REVASCULARIZATION N/A 02/09/2019   Procedure: Coronary/Graft Acute MI Revascularization;  Surgeon: Lyn Records, MD;  Location: [facility] INVASIVE CV LAB;  Service: Cardiovascular;  Laterality: N/A;  . LEFT HEART CATH AND CORONARY ANGIOGRAPHY N/A 02/09/2019   Procedure: LEFT HEART CATH AND CORONARY ANGIOGRAPHY;  Surgeon: Lyn Records, MD;  Location: [facility] INVASIVE CV LAB;  Service: Cardiovascular;  Laterality: N/A;   History reviewed. No pertinent family history. Social History:  reports that she has never smoked. She has never used smokeless tobacco. She reports that she does not drink alcohol or use drugs. Allergies: No Known Allergies Medications Prior to Admission  Medication Sig Dispense Refill  . albuterol (VENTOLIN HFA) 108 (90 Base) MCG/ACT inhaler Inhale 2 puffs into the lungs every 6 (six) hours as needed for wheezing or shortness of breath.    Marland Kitchen atorvastatin (LIPITOR) 80 MG tablet Take 80 mg by mouth at bedtime.    . bisoprolol (ZEBETA) 5 MG tablet Take 5 mg by mouth daily.    . Calcium Carbonate-Vitamin D3 (CALCIUM 600+D3) 600-400 MG-UNIT TABS Take 1 tablet by mouth daily.    . clopidogrel (PLAVIX) 75 MG tablet Take 75 mg by mouth daily.    . febuxostat (ULORIC) 40 MG tablet Take 40 mg by mouth daily.    . ferrous sulfate 325 (65 FE) MG tablet Take 325 mg by mouth daily with breakfast.    . furosemide (LASIX) 40 MG tablet Take 40 mg by mouth daily.    . Insulin Isophane & Regular Human (HUMULIN 70/30 KWIKPEN) (70-30) 100 UNIT/ML PEN  Inject 28-30 Units into the skin 2 (two) times daily.    . insulin regular (HUMULIN R) 100 units/mL injection Inject 10 Units into the skin daily with lunch.    . linaGLIPtin-metFORMIN HCl 2.5-500 MG TABS Take 1 tablet by  mouth daily.    . montelukast (SINGULAIR) 10 MG tablet Take 10 mg by mouth at bedtime.    . NON FORMULARY Take 1 tablet by mouth See admin instructions. NEVRALIP 600 RETARD- (NevralipT 600 retard is a Food Supplement containing Lipoic Acid, Chromium Picolinate, Vitamins and Minerals. Selenium, Zinc and Vitamin E contribute to the protection of cells from oxidative stress): Take 1 tablet by mouth once a day or as otherwise directed    . pantoprazole (PROTONIX) 40 MG tablet Take 40 mg by mouth daily before breakfast.    . pregabalin (LYRICA) 75 MG capsule Take 75 mg by mouth 2 (two) times daily.    Marland Kitchen PRESCRIPTION MEDICATION Take 16 mg by mouth See admin instructions. BETAHISTINE DIHYDROCHLORIDE 16 MG TABLETS: Take 16 mg by mouth daily or as otherwise directed    . spironolactone (ALDACTONE) 50 MG tablet Take 50 mg by mouth daily.    Marland Kitchen telmisartan (MICARDIS) 80 MG tablet Take 80 mg by mouth daily.      Home: Home Living Family/patient expects to be discharged to:: Private residence Living Arrangements: Children, Other relatives Available Help at Discharge: Family, Available 24 hours/day Additional Comments: unsure, pt is unable to report and family is not currently present.   Functional History: Prior Function Level of Independence: Independent Comments: pt visits the [place] 7 months out of the year, was at her daughter's x 10 days when she became ill Functional Status:  Mobility: Bed Mobility Overal bed mobility: Needs Assistance Bed Mobility: Sit to Supine Rolling: Mod assist Sidelying to sit: Mod assist Supine to sit: Mod assist, HOB elevated Sit to supine: +2 for physical assistance, Total assist General bed mobility comments: pt up in chair upon PT arrival Transfers Overall transfer level: Needs assistance Equipment used: Rolling walker (2 wheeled) Transfers: Sit to/from Stand Sit to Stand: Mod assist Stand pivot transfers: Mod assist, +2 physical assistance, +2 safety/equipment   Lateral/Scoot Transfers: Total assist General transfer comment: v/c's to push up from arm rests not pull up on walker, completed 4 sit to stands Ambulation/Gait Ambulation/Gait assistance: Min assist, +2 safety/equipment Gait Distance (Feet): 12 Feet(x3) Assistive device: Rolling walker (2 wheeled) Gait Pattern/deviations: Step-through pattern, Decreased stride length General Gait Details: pt with inc SOB and onset of fatigue limited amb tolerance however much improved from monday's session. Pt completed 3 bouts of 12 feet with max encouragement. Pt SpO2 >90 while on RA. took 2 min rest breaks in between bouts Gait velocity: dec Gait velocity interpretation: <1.31 ft/sec, indicative of household ambulator    ADL: ADL Overall ADL's : Needs assistance/impaired Eating/Feeding: NPO Grooming: Oral care, Sitting, Supervision/safety Grooming Details (indicate cue type and reason): suction swab for oral care with setup assistance, while seated supported  Upper Body Bathing: Maximal assistance, Sitting Upper Body Bathing Details (indicate cue type and reason): for back EOB Toilet Transfer: +2 for physical assistance, +2 for safety/equipment, Stand-pivot, Moderate assistance Toilet Transfer Details (indicate cue type and reason): recliner to EOB Toileting- Clothing Manipulation and Hygiene: Maximal assistance, +2 for physical assistance, +2 for safety/equipment, Sit to/from stand Toileting - Clothing Manipulation Details (indicate cue type and reason): 3rd person helpful for rear perianal care Functional mobility during ADLs: Maximal assistance, +2 for physical assistance, +2  for safety/equipment General ADL Comments: fatigues very quickly  Cognition: Cognition Overall Cognitive Status: Difficult to assess Orientation Level: Intubated/Tracheostomy - Unable to assess Cognition Arousal/Alertness: Awake/alert Behavior During Therapy: WFL for tasks assessed/performed Overall Cognitive Status:  Difficult to assess General Comments: pt perseverating on asking for something to drink despite saying no/shaking head no Difficult to assess due to: Tracheostomy, Non-English speaking  Blood pressure 117/60, pulse 81, temperature 98.6 F (37 C), temperature source Oral, resp. rate 12, height 5\' 6"  (1.676 m), weight 73.4 kg, SpO2 100 %. Physical Exam  Nursing note and vitals reviewed. Constitutional: She appears well-developed and well-nourished. No distress.  HENT:  Head: Normocephalic and atraumatic.  Eyes: Pupils are equal, round, and reactive to light. Conjunctivae and EOM are normal.  Cardiovascular: Normal rate. An irregularly irregular rhythm present.  No murmur heard. Respiratory: Effort normal. No respiratory distress. She has wheezes.  GI: Soft. Bowel sounds are normal. She exhibits no distension. There is no abdominal tenderness.  Genitourinary:    Genitourinary Comments: Peri-wick   Musculoskeletal:     Comments: No pain with upper limb or lower limb range of motion  Neurological: She is alert.  Patient sitting up in bed. Non-English-speaking. Followed limited motor commands. Tracheostomy tube was in place overall exam was limited due to language barrier and tracheostomy tube  The patient is oriented to [facility], hospital but not [place] or [employer].  The patient is oriented to 2020 but not to month.  She states she entered the hospital in February  Strength is 4/5 bilateral deltoid, bicep, tricep, grip, hip flexor, knee extensor, ankle dorsiflexor  Skin: Skin is warm and dry. She is not diaphoretic.  Psychiatric: She has a normal mood and affect.    Results for orders placed or performed during the hospital encounter of 02/03/19 (from the past 24 hour(s))  Glucose, capillary     Status: Abnormal   Collection Time: 03/19/19  3:51 PM  Result Value Ref Range   Glucose-Capillary 192 (H) 70 - 99 mg/dL  Glucose, capillary     Status: Abnormal   Collection Time:  03/19/19  7:42 PM  Result Value Ref Range   Glucose-Capillary 227 (H) 70 - 99 mg/dL  Glucose, capillary     Status: Abnormal   Collection Time: 03/20/19 12:12 AM  Result Value Ref Range   Glucose-Capillary 243 (H) 70 - 99 mg/dL  Glucose, capillary     Status: Abnormal   Collection Time: 03/20/19  3:40 AM  Result Value Ref Range   Glucose-Capillary 135 (H) 70 - 99 mg/dL  CBC     Status: Abnormal   Collection Time: 03/20/19  7:36 AM  Result Value Ref Range   WBC 11.0 (H) 4.0 - 10.5 K/uL   RBC 3.46 (L) 3.87 - 5.11 MIL/uL   Hemoglobin 9.0 (L) 12.0 - 15.0 g/dL   HCT 16.1 (L) 09.6 - 04.5 %   MCV 89.0 80.0 - 100.0 fL   MCH 26.0 26.0 - 34.0 pg   MCHC 29.2 (L) 30.0 - 36.0 g/dL   RDW 40.9 (H) 81.1 - 91.4 %   Platelets 270 150 - 400 K/uL   nRBC 0.0 0.0 - 0.2 %  Basic metabolic panel     Status: Abnormal   Collection Time: 03/20/19  7:36 AM  Result Value Ref Range   Sodium 147 (H) 135 - 145 mmol/L   Potassium 4.3 3.5 - 5.1 mmol/L   Chloride 105 98 - 111 mmol/L   CO2 31  22 - 32 mmol/L   Glucose, Bld 81 70 - 99 mg/dL   BUN 52 (H) 8 - 23 mg/dL   Creatinine, Ser 1.61 (H) 0.44 - 1.00 mg/dL   Calcium 9.1 8.9 - 09.6 mg/dL   GFR calc non Af Amer 42 (L) >60 mL/min   GFR calc Af Amer 48 (L) >60 mL/min   Anion gap 11 5 - 15  Glucose, capillary     Status: None   Collection Time: 03/20/19  8:53 AM  Result Value Ref Range   Glucose-Capillary 88 70 - 99 mg/dL  Glucose, capillary     Status: Abnormal   Collection Time: 03/20/19 12:11 PM  Result Value Ref Range   Glucose-Capillary 170 (H) 70 - 99 mg/dL   Comment 1 Notify RN    Comment 2 Document in Chart    Dg Swallowing Func-speech Pathology  Result Date: 03/19/2019 Objective Swallowing Evaluation: Type of Study: MBS-Modified Barium Swallow Study  Patient Details Name: Tatiana Tippens MRN: [id number] Date of Birth: May 22, 1946 Today's Date: 03/19/2019 Time: SLP Start Time (ACUTE ONLY): 1030 -SLP Stop Time (ACUTE ONLY): 1050 SLP Time Calculation (min)  (ACUTE ONLY): 20 min Past Medical History: Past Medical History: Diagnosis Date . Coronary artery disease  . Diabetes mellitus without complication (HCC)  . Hypertension  Past Surgical History: Past Surgical History: Procedure Laterality Date . CORONARY ANGIOPLASTY WITH STENT PLACEMENT   . CORONARY/GRAFT ACUTE MI REVASCULARIZATION N/A 02/09/2019  Procedure: Coronary/Graft Acute MI Revascularization;  Surgeon: Lyn Records, MD;  Location: [facility] INVASIVE CV LAB;  Service: Cardiovascular;  Laterality: N/A; . LEFT HEART CATH AND CORONARY ANGIOGRAPHY N/A 02/09/2019  Procedure: LEFT HEART CATH AND CORONARY ANGIOGRAPHY;  Surgeon: Lyn Records, MD;  Location: [facility] INVASIVE CV LAB;  Service: Cardiovascular;  Laterality: N/A; HPI: 73 y.o. female admitted on 02/03/19 for fever and cough (to [facility]) and was transferred to [facility] on 02/04/19 for hypotension and respiratory distress (acute respiratory failure/pulmonary edema) requiring intubation 3/10.  She was visiting family from [place] and COVID 19 tests were negative.  She was found to have group A strep bacteremia (septic shiock), rhinovirus,  and course complicated by NSTEMI and new LV dysfunction s/p cardiac cath on 02/09/19.  Other dx include ischemic cardiomyopathy, hyperkalemia, DM2- uncontrolled, and abdominal distention.  Pt with significant PMH of DM, HTN, anc CAD. Trach placed on 3/31  Subjective: pt alert, anxious Assessment / Plan / Recommendation CHL IP CLINICAL IMPRESSIONS 03/19/2019 Clinical Impression Pt has a mild oropharyngeal dysphagia felt to be secondary to prolonged and repeated intubations and overall deconditioning. She has reduced lingual manipulation and slow, incomplete oral clearance, with lingual residue increasing as boluses become more solid. Base of tongue retraction, hyolaryngeal movement, and epiglottic inversion are all reduced. This results in decreased laryngeal vestibule closure, and pt has aspiration before and during the  swallow with thin and nectar thick liquids that cannot be cleared fully given intermittent spontaneous coughing. Aforementioned weakness also results in trace vallecular residue across consistencies but also with the barium tablet remaining lodged in the valleculae, requiring multiple pureed and honey thick liquid boluses to clear. Considering the above as well as her mentation and language barrier, limited compensatory strategies were attempted (although also not found to be effective). Recommend starting Dys 2 diet and honey thick liquids with PMV in place and full supervision provided. Will f/u for tolerance and hopeful improvement given additional therapy and time post-extubation. SLP Visit Diagnosis Dysphagia, oropharyngeal phase (R13.12) Attention and  concentration deficit following -- Frontal lobe and executive function deficit following -- Impact on safety and function Moderate aspiration risk;Mild aspiration risk   CHL IP TREATMENT RECOMMENDATION 03/19/2019 Treatment Recommendations Therapy as outlined in treatment plan below   Prognosis 03/19/2019 Prognosis for Safe Diet Advancement Good Barriers to Reach Goals -- Barriers/Prognosis Comment -- CHL IP DIET RECOMMENDATION 03/19/2019 SLP Diet Recommendations Dysphagia 2 (Fine chop) solids;Honey thick liquids Liquid Administration via Cup;Straw Medication Administration Crushed with puree Compensations Slow rate;Small sips/bites Postural Changes Seated upright at 90 degrees   CHL IP OTHER RECOMMENDATIONS 03/19/2019 Recommended Consults -- Oral Care Recommendations Oral care BID Other Recommendations Order thickener from pharmacy;Prohibited food (jello, ice cream, thin soups);Remove water pitcher;Place PMSV during PO intake   CHL IP FOLLOW UP RECOMMENDATIONS 03/19/2019 Follow up Recommendations Skilled Nursing facility   [facility] IP FREQUENCY AND DURATION 03/19/2019 Speech Therapy Frequency (ACUTE ONLY) min 2x/week Treatment Duration 2 weeks      CHL IP ORAL PHASE  03/19/2019 Oral Phase Impaired Oral - Pudding Teaspoon -- Oral - Pudding Cup -- Oral - Honey Teaspoon Reduced posterior propulsion;Lingual/palatal residue Oral - Honey Cup Reduced posterior propulsion;Lingual/palatal residue Oral - Nectar Teaspoon Reduced posterior propulsion;Lingual/palatal residue Oral - Nectar Cup -- Oral - Nectar Straw -- Oral - Thin Teaspoon Premature spillage Oral - Thin Cup -- Oral - Thin Straw -- Oral - Puree Reduced posterior propulsion;Lingual/palatal residue Oral - Mech Soft Reduced posterior propulsion;Lingual/palatal residue;Impaired mastication Oral - Regular -- Oral - Multi-Consistency -- Oral - Pill Delayed oral transit Oral Phase - Comment --  CHL IP PHARYNGEAL PHASE 03/19/2019 Pharyngeal Phase Impaired Pharyngeal- Pudding Teaspoon -- Pharyngeal -- Pharyngeal- Pudding Cup -- Pharyngeal -- Pharyngeal- Honey Teaspoon Reduced epiglottic inversion;Reduced anterior laryngeal mobility;Reduced laryngeal elevation;Reduced airway/laryngeal closure;Reduced tongue base retraction;Pharyngeal residue - valleculae Pharyngeal -- Pharyngeal- Honey Cup Reduced epiglottic inversion;Reduced anterior laryngeal mobility;Reduced laryngeal elevation;Reduced airway/laryngeal closure;Reduced tongue base retraction;Pharyngeal residue - valleculae Pharyngeal -- Pharyngeal- Nectar Teaspoon Reduced epiglottic inversion;Reduced anterior laryngeal mobility;Reduced laryngeal elevation;Reduced airway/laryngeal closure;Reduced tongue base retraction;Penetration/Aspiration before swallow;Pharyngeal residue - valleculae Pharyngeal Material enters airway, passes BELOW cords and not ejected out despite cough attempt by patient Pharyngeal- Nectar Cup -- Pharyngeal -- Pharyngeal- Nectar Straw -- Pharyngeal -- Pharyngeal- Thin Teaspoon Reduced epiglottic inversion;Reduced anterior laryngeal mobility;Reduced laryngeal elevation;Reduced airway/laryngeal closure;Reduced tongue base retraction;Penetration/Aspiration during  swallow Pharyngeal Material enters airway, passes BELOW cords and not ejected out despite cough attempt by patient Pharyngeal- Thin Cup -- Pharyngeal -- Pharyngeal- Thin Straw -- Pharyngeal -- Pharyngeal- Puree Reduced epiglottic inversion;Reduced anterior laryngeal mobility;Reduced laryngeal elevation;Reduced airway/laryngeal closure;Reduced tongue base retraction;Pharyngeal residue - valleculae Pharyngeal -- Pharyngeal- Mechanical Soft Reduced epiglottic inversion;Reduced anterior laryngeal mobility;Reduced laryngeal elevation;Reduced airway/laryngeal closure;Reduced tongue base retraction;Pharyngeal residue - valleculae Pharyngeal -- Pharyngeal- Regular -- Pharyngeal -- Pharyngeal- Multi-consistency -- Pharyngeal -- Pharyngeal- Pill Reduced epiglottic inversion;Reduced anterior laryngeal mobility;Reduced laryngeal elevation;Reduced airway/laryngeal closure;Reduced tongue base retraction;Pharyngeal residue - valleculae Pharyngeal -- Pharyngeal Comment --  CHL IP CERVICAL ESOPHAGEAL PHASE 03/19/2019 Cervical Esophageal Phase WFL Pudding Teaspoon -- Pudding Cup -- Honey Teaspoon -- Honey Cup -- Nectar Teaspoon -- Nectar Cup -- Nectar Straw -- Thin Teaspoon -- Thin Cup -- Thin Straw -- Puree -- Mechanical Soft -- Regular -- Multi-consistency -- Pill -- Cervical Esophageal Comment -- [name] Nix 03/19/2019, 5:19 PM  [name], M.A. CCC-SLP Acute Rehabilitation Services Pager [phone] Office [phone]               Assessment/Plan: Diagnosis: Debility after exacerbation of CHF as well as respiratory failure  1. Does the need for close, 24 hr/day medical supervision in concert with the patient's rehab needs make it unreasonable for this patient to be served in a less intensive setting? Yes 2. Co-Morbidities requiring supervision/potential complications: Coronary artery disease status post coronary stents with distal reocclusion, A. fib RVR, dysphasia, diabetes, CKD 3 due to bladder management, bowel  management, safety, skin/wound care, disease management, medication administration, pain management, patient education and Trach care, does the patient require 24 hr/day rehab nursing? Yes 3. Does the patient require coordinated care of a physician, rehab nurse, PT (1-2 hrs/day, 5 days/week), OT (1-2 hrs/day, 5 days/week) and SLP (.5-1 hrs/day, 5 days/week) to address physical and functional deficits in the context of the above medical diagnosis(es)? Yes, speech recommended for swallowing as well as for cognition Addressing deficits in the following areas: balance, endurance, locomotion, strength, transferring, bowel/bladder control, bathing, dressing, feeding, grooming, toileting, cognition, speech, swallowing and psychosocial support 4. Can the patient actively participate in an intensive therapy program of at least 3 hrs of therapy per day at least 5 days per week? Potentially 5. The potential for patient to make measurable gains while on inpatient rehab is fair 6. Anticipated functional outcomes upon discharge from inpatient rehab are supervision  with PT, supervision with OT, supervision with SLP. 7. Estimated rehab length of stay to reach the above functional goals is: 14 to 18 days 8. Anticipated D/C setting: Home 9. Anticipated post D/C treatments: HH therapy 10. Overall Rehab/Functional Prognosis: good  RECOMMENDATIONS: This patient's condition is appropriate for continued rehabilitative care in the following setting: CIR Patient has agreed to participate in recommended program. No Note that insurance prior authorization may be required for reimbursement for recommended care.  Comment: Patient states that she refuses to be transferred to another part of the hospital.  She states that she will go home from this unit.  "Do not send me to rehab "  "I have personally performed a face to face diagnostic evaluation of this patient.  Additionally, I have reviewed and concur with the physician  assistant's documentation above."  Erick Colace M.D. [facility] FAAPM&R (Sports Med, Neuromuscular Med) Diplomate Am Board of Electrodiagnostic Med  Lynnae Prude 03/20/2019

## 2019-03-20 NOTE — Progress Notes (Signed)
Occupational Therapy Treatment Patient Details Name: Gabrielle Wright MRN: [id number] DOB: 22-Dec-1945 Today's Date: 03/20/2019    History of present illness 73 y.o. female admitted on 02/03/19 for fever and cough (to [facility]) and was transferred to [facility] on 02/04/19 for hypotension and respiratory distress (acute respiratory failure/pulmonary edema) requiring intubation 3/10-current (time of PT eval 02/27/2019).  She was visiting family from [place] and COVID 19 tests were negative.  She was found to have group A strep bacteremia (septic shiock), rhinovirus,  and course complicated by NSTEMI and new LV dysfunction s/p cardiac cath on 02/09/19.  Cardiology following.  Other dx include ischemic cardiomyopathy, hyperkalemia, DM2- uncontrolled, and abdominal distention.  Pt with significant PMH of DM, HTN, anc CAD. Trach placed on 3/31   OT comments  Pt performing rolling in bed side to side as RN assisting with bathing. Pt given cloth, but only able to perform UB bathing. Pt performing supine to sitting EOB with modA; transfers with minA +2 and taking a few steps to recliner to sit upright. Prior to ambulation, pt sat at EOB with o2 sats  >95% on 28% O2 through trach. Pt using gestures as she swatted away as OT wanting to use stratus. Rn in room for ADL and mobility. Pt fatigues easily, but showing remarkable improvements. Pt continues to progress and benefit from continued OT skilled services for ADL, mobility and safety in SNF setting. OT to follow acutely.    Follow Up Recommendations  SNF;Home health OT;Supervision/Assistance - 24 hour(pt progressing well if can tolerate weaning fron O2)    Equipment Recommendations       Recommendations for Other Services      Precautions / Restrictions Precautions Precautions: Fall Precaution Comments: trach, on 28%  Restrictions Weight Bearing Restrictions: No       Mobility Bed Mobility Overal bed mobility: Needs Assistance Bed Mobility: Sit  to Supine Rolling: Mod assist Sidelying to sit: Mod assist Supine to sit: Mod assist;HOB elevated     General bed mobility comments: pt doing very well with mobility  Transfers Overall transfer level: Needs assistance Equipment used: Rolling walker (2 wheeled) Transfers: Sit to/from Stand Sit to Stand: Mod assist         General transfer comment: MinA+2 from bed    Balance Overall balance assessment: Needs assistance Sitting-balance support: Feet supported;No upper extremity supported Sitting balance-Leahy Scale: Fair     Standing balance support: Bilateral upper extremity supported;During functional activity Standing balance-Leahy Scale: Fair Standing balance comment: reliant on B UE and external support                            ADL either performed or assessed with clinical judgement   ADL Overall ADL's : Needs assistance/impaired         Upper Body Bathing: Set up;Bed level   Lower Body Bathing: Moderate assistance;Sitting/lateral leans;Bed level                       Functional mobility during ADLs: Minimal assistance;+2 for physical assistance;+2 for safety/equipment;Rolling walker General ADL Comments: fatigues very quickly     Vision   Vision Assessment?: No apparent visual deficits   Perception     Praxis      Cognition Arousal/Alertness: Awake/alert Behavior During Therapy: WFL for tasks assessed/performed Overall Cognitive Status: Difficult to assess  Exercises     Shoulder Instructions       General Comments Pt tolerating session well. O2 levels >90% on trach 28%    Pertinent Vitals/ Pain       Pain Assessment: Faces Faces Pain Scale: No hurt Pain Intervention(s): Limited activity within patient's tolerance  Home Living                                          Prior Functioning/Environment              Frequency  Min 3X/week         Progress Toward Goals  OT Goals(current goals can now be found in the care plan section)  Progress towards OT goals: Progressing toward goals  Acute Rehab OT Goals Patient Stated Goal: not stated today OT Goal Formulation: With family Time For Goal Achievement: 04/03/19 Potential to Achieve Goals: Fair ADL Goals Pt Will Perform Grooming: with modified independence Pt Will Transfer to Toilet: with set-up;ambulating;regular height toilet Pt Will Perform Toileting - Clothing Manipulation and hygiene: with modified independence;sit to/from stand Pt/caregiver will Perform Home Exercise Program: Increased strength;Both right and left upper extremity;With written HEP provided Additional ADL Goal #1: Pt will tolerate sitting EOB during functional tasks for 10 minutes with min guard for safety. Additional ADL Goal #3: Pt will follow 1-2 step commands with 90% accuracy in order to optimize participation in ADLs. Additional ADL Goal #4: Pt will perform ADL functional mobility in room with standing x5 mins with supervisionA for safety.  Plan Discharge plan remains appropriate;Frequency remains appropriate;Discharge plan needs to be updated    Co-evaluation                 AM-PAC OT "6 Clicks" Daily Activity     Outcome Measure   Help from another person eating meals?: A Little Help from another person taking care of personal grooming?: A Little Help from another person toileting, which includes using toliet, bedpan, or urinal?: A Lot Help from another person bathing (including washing, rinsing, drying)?: A Lot Help from another person to put on and taking off regular upper body clothing?: A Lot Help from another person to put on and taking off regular lower body clothing?: A Lot 6 Click Score: 14    End of Session Equipment Utilized During Treatment: Gait belt;Oxygen;Rolling walker  OT Visit Diagnosis: Muscle weakness (generalized) (M62.81);Other symptoms and signs  involving cognitive function   Activity Tolerance Patient limited by fatigue   Patient Left in chair;with call bell/phone within reach;with chair alarm set;with nursing/sitter in room   Nurse Communication Mobility status        Time: 1525-1550 OT Time Calculation (min): 25 min  Charges: OT General Charges $OT Visit: 1 Visit OT Treatments $Self Care/Home Management : 23-37 mins  Revonda Standard Cecil Cranker) Glendell Docker OTR/L Acute Rehabilitation Services Pager: [phone] Office: [phone]    Lonzo Cloud 03/20/2019, 5:01 PM

## 2019-03-21 ENCOUNTER — Inpatient Hospital Stay (HOSPITAL_COMMUNITY): Payer: Medicaid Other

## 2019-03-21 DIAGNOSIS — R1310 Dysphagia, unspecified: Secondary | ICD-10-CM

## 2019-03-21 LAB — CBC
HCT: 29.3 % — ABNORMAL LOW (ref 36.0–46.0)
Hemoglobin: 8.6 g/dL — ABNORMAL LOW (ref 12.0–15.0)
MCH: 26 pg (ref 26.0–34.0)
MCHC: 29.4 g/dL — ABNORMAL LOW (ref 30.0–36.0)
MCV: 88.5 fL (ref 80.0–100.0)
Platelets: 248 10*3/uL (ref 150–400)
RBC: 3.31 MIL/uL — ABNORMAL LOW (ref 3.87–5.11)
RDW: 16.7 % — ABNORMAL HIGH (ref 11.5–15.5)
WBC: 9.7 10*3/uL (ref 4.0–10.5)
nRBC: 0 % (ref 0.0–0.2)

## 2019-03-21 LAB — BASIC METABOLIC PANEL
Anion gap: 13 (ref 5–15)
BUN: 39 mg/dL — ABNORMAL HIGH (ref 8–23)
CO2: 28 mmol/L (ref 22–32)
Calcium: 9 mg/dL (ref 8.9–10.3)
Chloride: 101 mmol/L (ref 98–111)
Creatinine, Ser: 1.19 mg/dL — ABNORMAL HIGH (ref 0.44–1.00)
GFR calc Af Amer: 53 mL/min — ABNORMAL LOW (ref >60)
GFR calc non Af Amer: 46 mL/min — ABNORMAL LOW (ref >60)
Glucose, Bld: 104 mg/dL — ABNORMAL HIGH (ref 70–99)
Potassium: 4.2 mmol/L (ref 3.5–5.1)
Sodium: 142 mmol/L (ref 135–145)

## 2019-03-21 LAB — GLUCOSE, CAPILLARY
Glucose-Capillary: 105 mg/dL — ABNORMAL HIGH (ref 70–99)
Glucose-Capillary: 120 mg/dL — ABNORMAL HIGH (ref 70–99)
Glucose-Capillary: 132 mg/dL — ABNORMAL HIGH (ref 70–99)
Glucose-Capillary: 186 mg/dL — ABNORMAL HIGH (ref 70–99)
Glucose-Capillary: 212 mg/dL — ABNORMAL HIGH (ref 70–99)
Glucose-Capillary: 89 mg/dL (ref 70–99)

## 2019-03-21 MED ORDER — PANTOPRAZOLE SODIUM 40 MG PO PACK
40.0000 mg | PACK | Freq: Every day | ORAL | Status: DC
  Administered 2019-03-22 – 2019-03-27 (×6): 40 mg via ORAL
  Filled 2019-03-21 (×8): qty 20

## 2019-03-21 MED ORDER — ACETAMINOPHEN 325 MG PO TABS
650.0000 mg | ORAL_TABLET | Freq: Four times a day (QID) | ORAL | Status: DC | PRN
  Administered 2019-03-21 – 2019-03-27 (×9): 650 mg via ORAL
  Filled 2019-03-21 (×11): qty 2

## 2019-03-21 MED ORDER — LORAZEPAM 2 MG/ML IJ SOLN
0.5000 mg | INTRAMUSCULAR | Status: DC | PRN
  Administered 2019-03-21: 1 mg via INTRAVENOUS
  Administered 2019-03-21: 0.5 mg via INTRAVENOUS
  Administered 2019-03-21 – 2019-03-23 (×5): 1 mg via INTRAVENOUS
  Filled 2019-03-21 (×7): qty 1

## 2019-03-21 MED ORDER — CARVEDILOL 3.125 MG PO TABS
3.1250 mg | ORAL_TABLET | Freq: Two times a day (BID) | ORAL | Status: DC
  Administered 2019-03-21 – 2019-03-25 (×7): 3.125 mg via ORAL
  Filled 2019-03-21 (×8): qty 1

## 2019-03-21 MED ORDER — ASPIRIN 81 MG PO CHEW
81.0000 mg | CHEWABLE_TABLET | Freq: Every day | ORAL | Status: DC
  Administered 2019-03-22 – 2019-03-27 (×6): 81 mg via ORAL
  Filled 2019-03-21 (×6): qty 1

## 2019-03-21 MED ORDER — PANTOPRAZOLE SODIUM 40 MG PO TBEC: 40.0000 mg | DELAYED_RELEASE_TABLET | Freq: Every day | ORAL | Status: DC

## 2019-03-21 MED ORDER — LOSARTAN POTASSIUM 25 MG PO TABS
25.0000 mg | ORAL_TABLET | Freq: Every day | ORAL | Status: DC
  Administered 2019-03-22 – 2019-03-27 (×6): 25 mg via ORAL
  Filled 2019-03-21 (×6): qty 1

## 2019-03-21 MED ORDER — ALBUTEROL SULFATE (2.5 MG/3ML) 0.083% IN NEBU: 2.5000 mg | INHALATION_SOLUTION | Freq: Three times a day (TID) | RESPIRATORY_TRACT | Status: DC

## 2019-03-21 MED ORDER — ORAL CARE MOUTH RINSE
15.0000 mL | Freq: Two times a day (BID) | OROMUCOSAL | Status: DC
  Administered 2019-03-22 – 2019-03-26 (×10): 15 mL via OROMUCOSAL

## 2019-03-21 MED ORDER — SIMETHICONE 80 MG PO CHEW
40.0000 mg | CHEWABLE_TABLET | Freq: Four times a day (QID) | ORAL | Status: DC | PRN
  Administered 2019-03-23 – 2019-03-27 (×5): 40 mg via ORAL
  Filled 2019-03-21 (×5): qty 1

## 2019-03-21 MED ORDER — TRAZODONE HCL 50 MG PO TABS
50.0000 mg | ORAL_TABLET | Freq: Every day | ORAL | Status: DC
  Administered 2019-03-21 – 2019-03-26 (×6): 50 mg via ORAL
  Filled 2019-03-21 (×6): qty 1

## 2019-03-21 MED ORDER — AMIODARONE HCL 200 MG PO TABS
200.0000 mg | ORAL_TABLET | Freq: Every day | ORAL | Status: DC
  Administered 2019-03-22 – 2019-03-27 (×6): 200 mg via ORAL
  Filled 2019-03-21 (×6): qty 1

## 2019-03-21 MED ORDER — FUROSEMIDE 10 MG/ML IJ SOLN
40.0000 mg | Freq: Once | INTRAMUSCULAR | Status: AC
  Administered 2019-03-21: 40 mg via INTRAVENOUS
  Filled 2019-03-21: qty 4

## 2019-03-21 MED ORDER — HYDRALAZINE HCL 10 MG PO TABS
10.0000 mg | ORAL_TABLET | Freq: Three times a day (TID) | ORAL | Status: DC
  Administered 2019-03-21 – 2019-03-25 (×11): 10 mg via ORAL
  Filled 2019-03-21 (×13): qty 1

## 2019-03-21 MED ORDER — SERTRALINE HCL 50 MG PO TABS
50.0000 mg | ORAL_TABLET | Freq: Every day | ORAL | Status: DC
  Administered 2019-03-22 – 2019-03-27 (×6): 50 mg via ORAL
  Filled 2019-03-21 (×6): qty 1

## 2019-03-21 MED ORDER — ADULT MULTIVITAMIN W/MINERALS CH
1.0000 | ORAL_TABLET | Freq: Every day | ORAL | Status: DC
  Administered 2019-03-22 – 2019-03-27 (×6): 1 via ORAL
  Filled 2019-03-21 (×6): qty 1

## 2019-03-21 MED ORDER — ISOSORBIDE DINITRATE 10 MG PO TABS
10.0000 mg | ORAL_TABLET | Freq: Three times a day (TID) | ORAL | Status: DC
  Administered 2019-03-21 – 2019-03-25 (×12): 10 mg via ORAL
  Filled 2019-03-21 (×17): qty 1

## 2019-03-21 MED ORDER — ATORVASTATIN CALCIUM 40 MG PO TABS
40.0000 mg | ORAL_TABLET | Freq: Every day | ORAL | Status: DC
  Administered 2019-03-21 – 2019-03-26 (×6): 40 mg via ORAL
  Filled 2019-03-21 (×6): qty 1

## 2019-03-21 MED ORDER — ALBUTEROL SULFATE (2.5 MG/3ML) 0.083% IN NEBU
2.5000 mg | INHALATION_SOLUTION | Freq: Three times a day (TID) | RESPIRATORY_TRACT | Status: DC
  Administered 2019-03-21 – 2019-03-24 (×10): 2.5 mg via RESPIRATORY_TRACT
  Filled 2019-03-21 (×10): qty 3

## 2019-03-21 MED ORDER — LACTULOSE 10 GM/15ML PO SOLN
10.0000 g | Freq: Every day | ORAL | Status: DC | PRN
  Administered 2019-03-26: 10 g via ORAL
  Filled 2019-03-21: qty 15

## 2019-03-21 MED ORDER — SENNA 8.6 MG PO TABS: 2.0000 | ORAL_TABLET | Freq: Two times a day (BID) | ORAL | Status: DC | PRN

## 2019-03-21 NOTE — Progress Notes (Signed)
PROGRESS NOTE    Gabrielle Wright  TZG:[id number] DOB: 11/13/1946 DOA: 02/03/2019 PCP: Rudene Anda, MD   Brief Narrative: 73 year old female PMHxfrom [place].HTN , CAD, DM Type 2? Noted to have a high fever 103.9. Reported to have cough. Taken to [place] [facility] per family. Was supported to have a urinary tract infection although her urine was clean. She never filled her prescription for antibiotics. Due to persistent hypotension and refractory nature of her hypertension she is transferred to [facility] 02/04/2019. She was in respiratory distress for abdominal chest wall paradoxus. She required intubation and central line placement. She had maxed out on peripheral IV vasopressors therefore central line was placed. Pulmonary critical care will be responsible for care at this time. She was placed in isolation since she came from  [place] with recent reports of coronavirus and that country. . Infectious disease was called and they reported again diagnosis done on 1 reported countries.  4/21: Patient has done quite well on trach collar for over 24 hours and has had no further anxiety noted.  She was  transfer to progressive care. Patient pass swallow evaluation. She removed NG tube.  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  -At this post trach on 3/30 first. -Tolerating trach collar. 28 % 5 L.  -Having some thick secretion.  She was stared on Mucinex. CCM will follow on 4-24 to evaluate need to change cuff.  -Secondary to ischemic  cardiomyopathy and pulmonary edema -CCM following. -Chest x ray with atelectasis/ left Lower lobe.  -IV lasix. Nebulizer.  -Planning to downside cuff to 4. Them on Monday plan to cap cuff for two nights.   Ischemic cardiomyopathy acute on chronic systolic and diastolic heart failure exacerbation: Echocardiogram with ejection fraction 35 to 40%, left diffuse hypokinesis. Underwent cardiac cath with no targets for revascularization. Cardiology recommending ACE, spironolactone and Lasix. Creatinine up today, will hold IV Lasix today and spironolactone and resume tomorrow as needed. Continue with cozaar. Monitor renal function.  Resume lasix, one dose IV. Resume spironolactone tomorrow.   A. fib with RVR resolved: Not on anticoagulation per cardiology drop in hemoglobin.  Hypernatremia: hold lasix today. Repeat labs in am.   Diabetes type 2 with complication: Continue with Lantus, sliding scale insulin.  Agitation anxiety On clonazepam PRN, increase to 1 mg  Continue with Zoloft    Dysphagia Pass swallow evaluation.  On dysphagia diet 2.      Nutrition Problem: Inadequate oral intake Etiology: inability to eat    Signs/Symptoms: NPO status    Interventions: Tube feeding  Estimated body mass index is 26.26 kg/m as calculated from the following:   Height as of this encounter: 5' 6" (1.676 m).   Weight as of this encounter: 73.8 kg.   DVT prophylaxis: SCDs Code Status: Full code Family Communication Disposition Plan: CIR evaluation for admission.   Consultants:   CCM  Cardiology   Procedures:   Cath  Lurline Idol  Antimicrobials:      Subjective: Anxious, report SOB, and Cough.  She was not able to sleep overnight.    Objective: Vitals:   03/21/19 0600 03/21/19 0737 03/21/19 0749 03/21/19 1207  BP:  (!) 125/50 (!) 125/50 (!) (P) 97/50   Pulse:  90  (P) 91  Resp:  16 (!) 21 (P) 19  Temp:  98.1 F (36.7 C)  (P) 98.1 F (36.7 C)  TempSrc:  Oral  (P) Oral  SpO2:  93% 100%   Weight: 73.8 kg     Height:        Intake/Output Summary (Last 24 hours) at 03/21/2019 1334 Last data filed at 03/21/2019 0622 Gross per 24 hour  Intake -  Output 851 ml  Net -851 ml   Filed Weights   03/18/19 0500 03/19/19 0410 03/21/19 0600  Weight: 72 kg 73.4 kg 73.8 kg    Examination:  General exam: NAD Respiratory system: Trach in place, crackles bases.  Cardiovascular system: S 1, S 2 RRR Gastrointestinal system: BS present, soft, nt Central nervous system: Alert, non focal.  Extremities: Symmetric power.  Skin: No rashes.     Data Reviewed: I have personally reviewed following labs and imaging studies  CBC: Recent Labs  Lab 03/17/19 0620 03/18/19 0205 03/19/19 0535 03/20/19 0736 03/21/19 0447  WBC 7.7 9.3 8.4 11.0* 9.7  HGB 7.9* 8.8* 9.1* 9.0* 8.6*  HCT 28.5* 31.5* 31.4* 30.8* 29.3*  MCV 90.5 90.8 90.2 89.0 88.5  PLT 267 280 282 270 161   Basic Metabolic Panel: Recent Labs  Lab 03/15/19 0404 03/16/19 0353 03/17/19 0620 03/18/19 0205 03/19/19 0535 03/20/19 0736 03/21/19 0447  NA 143 144 146* 148* 147* 147* 142  K 4.1 4.2 4.0 4.2 4.7 4.3 4.2  CL 106 105 106 109 105 105 101  CO2 27 27 33* _0 GLUCOSE 222* 269* 148* 212* 246* 81 104*  BUN 49* 50* 43* 48* 54* 52* 39*  CREATININE 1.23* 1.29* 1.17* 1.08* 1.29* 1.28* 1.19*  CALCIUM 8.3* 8.3* 8.6* 8.9 8.9 9.1 9.0  MG 2.9* 2.9* 2.7* 2.9*  --   --   --    GFR: Estimated Creatinine Clearance: 43.9 mL/min (A) (by C-G formula based on SCr of 1.19 mg/dL (H)). Liver Function Tests: No results for input(s): AST, ALT, ALKPHOS, BILITOT, PROT, ALBUMIN in the last 168 hours. No results for input(s): LIPASE, AMYLASE in the last 168 hours. No results for input(s): AMMONIA in the last 168 hours. Coagulation Profile: No results for input(s): INR, PROTIME in the last  168 hours. Cardiac Enzymes: No results for input(s): CKTOTAL, CKMB, CKMBINDEX, TROPONINI in the last 168 hours. BNP (last 3 results) No results for input(s): PROBNP in the last 8760 hours. HbA1C: No results for input(s): HGBA1C in the last 72 hours. CBG: Recent Labs  Lab 03/20/19 1943 03/21/19 0022 03/21/19 0405 03/21/19 0739 03/21/19 1212  GLUCAP 245* 120* 89 105* 186*   Lipid Profile: No results for input(s): CHOL, HDL, LDLCALC, TRIG, CHOLHDL, LDLDIRECT in the last 72 hours. Thyroid Function Tests: No results for input(s): TSH, T4TOTAL, FREET4, T3FREE, THYROIDAB in the last 72 hours. Anemia Panel: No results for input(s): VITAMINB12, FOLATE, FERRITIN, TIBC, IRON, RETICCTPCT in the last 72 hours. Sepsis Labs: No results for input(s): PROCALCITON, LATICACIDVEN in the last 168 hours.  Recent Results (from the past 240 hour(s))  Culture, respiratory (non-expectorated)     Status: None   Collection Time: 03/12/19  9:25 AM  Result Value  Ref Range Status   Specimen Description TRACHEAL ASPIRATE  Final   Special Requests NONE  Final   Gram Stain NO WBC SEEN RARE GRAM POSITIVE COCCI IN PAIRS   Final   Culture   Final    Consistent with normal respiratory flora. Performed at [facility], [place] [address], [place], [place] [address]    Report Status 03/14/2019 FINAL  Final  Culture, respiratory (non-expectorated)     Status: None   Collection Time: 03/15/19  9:00 AM  Result Value Ref Range Status   Specimen Description TRACHEAL ASPIRATE  Final   Special Requests NONE  Final   Gram Stain   Final    RARE WBC PRESENT, PREDOMINANTLY PMN FEW GRAM POSITIVE RODS FEW GRAM POSITIVE COCCI IN PAIRS RARE GRAM NEGATIVE RODS    Culture   Final    FEW Consistent with normal respiratory flora. Performed at [facility], [place] [address], [place], [place] [address]    Report Status 03/17/2019 FINAL  Final         Radiology Studies: Dg Chest Port 1 View  Result Date:  03/21/2019 CLINICAL DATA:  73 year old female with shortness of breath. Trach collar. EXAM: PORTABLE CHEST 1 VIEW COMPARISON:  03/17/2019 and earlier. FINDINGS: Portable AP upright view at 1025 hours. Stable tracheostomy. Right PICC line has been removed. Enteric tube has been removed. Continued low lung volumes. Regressed pulmonary interstitial opacity/vascular congestion since 03/17/2019, but continued hypo ventilation at the left lung base with patchy opacity. Stable cardiac size and mediastinal contours. No pneumothorax. Paucity of bowel gas in the upper abdomen. IMPRESSION: 1. Regressed pulmonary vascular congestion but continued low lung volumes with left lung base atelectasis or consolidation. 2. Enteric tube removed. Right PICC line removed. Electronically Signed   By: Genevie Ann M.D.   On: 03/21/2019 10:39        Scheduled Meds: . albuterol  2.5 mg Nebulization TID  . [START ON 03/22/2019] amiodarone  200 mg Oral Daily  . [START ON 03/22/2019] aspirin  81 mg Oral Daily  . atorvastatin  40 mg Oral q1800  . carvedilol  3.125 mg Oral BID WC  . chlorhexidine gluconate (MEDLINE KIT)  15 mL Mouth Rinse BID  . Chlorhexidine Gluconate Cloth  6 each Topical Q0600  . dextromethorphan-guaiFENesin  1 tablet Oral BID  . hydrALAZINE  10 mg Oral Q8H  . insulin aspart  0-24 Units Subcutaneous Q4H  . insulin glargine  25 Units Subcutaneous QHS  . isosorbide dinitrate  10 mg Oral TID  . [START ON 03/22/2019] losartan  25 mg Oral Daily  . mouth rinse  15 mL Mouth Rinse 10 times per day  . montelukast  10 mg Oral QHS  . [START ON 03/22/2019] multivitamin with minerals  1 tablet Oral Daily  . [START ON 03/22/2019] pantoprazole sodium  40 mg Oral Daily  . polyethylene glycol  17 g Oral BID  . [START ON 03/22/2019] sertraline  50 mg Oral Daily  . traZODone  50 mg Oral QHS   Continuous Infusions: . sodium chloride Stopped (03/04/19 0815)     LOS: 45 days    Time spent: 35 minutes.     Elmarie Shiley, MD [facility] Pager [phone] If 7PM-7AM, please contact night-coverage [email or web address] Password [email or web address] 03/21/2019, 1:34 PM

## 2019-03-21 NOTE — TOC Initial Note (Signed)
Transition of Care [facility]) - Initial/Assessment Note    Patient Details  Name: Gabrielle Wright MRN: [id number] Date of Birth: [birth year]  [facility]) CM/SW Contact:    Gwenlyn Fudge, LCSWA Phone Number: 03/21/2019, 2:11 PM  Clinical Narrative:           CSW called and spoke with the patients daughter. The patient is alert to self and is currently intubated. Patient is being followed by CIR and will be following up on Monday. Patient's daughter is declining SNF as a backup plan at this time. She stated that the family would prefer the patient to attend inpatient rehab. CSW explained that for some reason if the patient was not able to attend she wanted to discuss a back up plan. Patient's daughter would prefer her mother come home with home health if her mother is not able to go to CIR. Patient's daughter stated that her mother would object to any facility other than the hospital. CSW will continue to follow and assist with disposition.           Expected Discharge Plan: IP Rehab Facility Barriers to Discharge: Continued Medical Work up, English as a second language teacher   Patient Goals and CMS Choice Patient states their goals for this hospitalization and ongoing recovery are:: Pt daughter would like her mother to attend rehab to get better CMS Medicare.gov Compare Post Acute Care list provided to:: Other (Comment Required)(NA) Choice offered to / list presented to : NA  Expected Discharge Plan and Services Expected Discharge Plan: IP Rehab Facility In-house Referral: Clinical Social Work Discharge Planning Services: NA Post Acute Care Choice: IP Rehab Living arrangements for the past 2 months: Single Family Home                 DME Arranged: N/A DME Agency: NA       HH Arranged: NA HH Agency: NA        Prior Living Arrangements/Services Living arrangements for the past 2 months: Single Family Home Lives with:: Self, Adult Children Patient language and need for interpreter  reviewed:: Yes Do you feel safe going back to the place where you live?: Yes      Need for Family Participation in Patient Care: Yes (Comment) Care giver support system in place?: Yes (comment)   Criminal Activity/Legal Involvement Pertinent to Current Situation/Hospitalization: No - Comment as needed  Activities of Daily Living Home Assistive Devices/Equipment: None ADL Screening (condition at time of admission) Patient's cognitive ability adequate to safely complete daily activities?: Yes Is the patient deaf or have difficulty hearing?: No Does the patient have difficulty seeing, even when wearing glasses/contacts?: No Does the patient have difficulty concentrating, remembering, or making decisions?: No Patient able to express need for assistance with ADLs?: Yes Does the patient have difficulty dressing or bathing?: No Independently performs ADLs?: Yes (appropriate for developmental age) Does the patient have difficulty walking or climbing stairs?: No Weakness of Legs: Both Weakness of Arms/Hands: Both  Permission Sought/Granted Permission sought to share information with : Case Manager Permission granted to share information with : Yes, Verbal Permission Granted  Share Information with NAME: nupaira  Permission granted to share info w AGENCY: HH agency and CIR  Permission granted to share info w Relationship: daughter     Emotional Assessment Appearance:: Appears stated age Attitude/Demeanor/Rapport: Unable to Assess Affect (typically observed): Unable to Assess Orientation: : Oriented to Self(Alert but is currently intubated) Alcohol / Substance Use: Not Applicable Psych Involvement: No (comment)  Admission  diagnosis:  SIRS (systemic inflammatory response syndrome) (HCC) [R65.10] AKI (acute kidney injury) (HCC) [N17.9] Septic shock (HCC) [A41.9, R65.21] Sepsis (HCC) [A41.9] Patient Active Problem List   Diagnosis Date Noted  . Tracheostomy status (HCC)   . HCAP  (healthcare-associated pneumonia)   . Acute pulmonary edema (HCC)   . Abdominal distension (gaseous)   . Acute on chronic respiratory failure with hypoxia (HCC)   . AKI (acute kidney injury) (HCC)   . Septic shock (HCC)   . CAD (coronary artery disease), native coronary artery 02/09/2019  . 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 Sepsis (HCC) 02/04/2019  . Hypotension 02/04/2019  . Acute respiratory failure with hypoxemia (HCC)    PCP:  Truett Perna, MD Pharmacy:   [facility] [place], [place] - [address] [place] [address] Phone: [phone] Fax: [phone]     Social Determinants of Health (SDOH) Interventions    Readmission Risk Interventions No flowsheet data found.

## 2019-03-21 NOTE — Procedures (Signed)
Tracheostomy Change Note  Patient Details:   Name: Saylee Lord DOB: 08-26-1946 MRN: [id number]    Airway Documentation:     Evaluation  O2 sats: stable throughout Complications: No apparent complications Patient did tolerate procedure well. Bilateral Breath Sounds: Rhonchi, Diminished    Positive color change.    Forest Becker Erin Obando 03/21/2019, 3:52 PM

## 2019-03-21 NOTE — Progress Notes (Signed)
Physical Therapy Treatment Patient Details Name: Gabrielle Wright MRN: [id number] DOB: [birth year] Today's Date: 03/21/2019    History of Present Illness Pt is a 73 y.o. female visiting family from [place] admitted 02/03/19 with fever and cough; transfer to [facility] on 3/10 with hypotension and respiratory distress. ETT 3/10. COVID-19 tests (-). Found to have rhinovirus and septic shock. Course complicated by NSTEMI with lew LV dysfunction; s/p cardiac cath 3/15. Also with ischemic cardiomyopathy and hyperkalemia. S/p trach placement 3/31. PMH includes DM, HTN, CAD.   PT Comments    Pt seen finishing up with RT who had just finish switching to cuffless trach and more PMSV trials; pt fatigued after this. Currently requiring up to [facility] for mobility with RW. Limited by fatigue, generalized weakness, and decreased activity tolerance. Continue to recommend intensive CIR-level therapies to maximize functional mobility and independence prior to return home.   Follow Up Recommendations  CIR;Supervision for mobility/OOB     Equipment Recommendations  (TBD)    Recommendations for Other Services       Precautions / Restrictions Precautions Precautions: Fall Precaution Comments: Cuffless trach as of 4/24 Restrictions Weight Bearing Restrictions: No    Mobility  Bed Mobility Overal bed mobility: Needs Assistance Bed Mobility: Sit to Supine       Sit to supine: Min assist   General bed mobility comments: MinA to assist LEs into bed for return to supine  Transfers Overall transfer level: Needs assistance Equipment used: Rolling walker (2 wheeled) Transfers: Sit to/from Stand Sit to Stand: Mod assist         General transfer comment: ModA to stand from recliner; pt pulling on RW despite cues for hand placement. Poor eccentric control into sitting  Ambulation/Gait Ambulation/Gait assistance: Min assist;Mod assist Gait Distance (Feet): 5 Feet Assistive device: Rolling walker (2  wheeled) Gait Pattern/deviations: Step-through pattern;Decreased stride length;Trunk flexed Gait velocity: Decreased Gait velocity interpretation: <1.31 ft/sec, indicative of household ambulator General Gait Details: Slow, mildly unsteady gait with RW and min-modA to maintain balance; pt with posterior lean and trunk flexed. Max cues not to sit prematurely and for safety with RW   Stairs             Wheelchair Mobility    Modified Rankin (Stroke Patients Only)       Balance Overall balance assessment: Needs assistance Sitting-balance support: Feet supported;No upper extremity supported Sitting balance-Leahy Scale: Fair     Standing balance support: Bilateral upper extremity supported;During functional activity Standing balance-Leahy Scale: Poor Standing balance comment: Reliant on UE support                            Cognition Arousal/Alertness: Awake/alert Behavior During Therapy: WFL for tasks assessed/performed Overall Cognitive Status: Difficult to assess                                 General Comments: Interpreter having difficult time understanding pt via ipad interpreter. Pt able to follow gestural commands and make needs known fairly well      Exercises      General Comments General comments (skin integrity, edema, etc.): Pt had just finished with RT who had switched to cuffless trach; PMSV removed at end of PT session      Pertinent Vitals/Pain Pain Assessment: No/denies pain    Home Living  Prior Function            PT Goals (current goals can now be found in the care plan section) Acute Rehab PT Goals Patient Stated Goal: not stated today PT Goal Formulation: With patient Time For Goal Achievement: 03/31/19 Potential to Achieve Goals: Fair Progress towards PT goals: Progressing toward goals    Frequency    Min 3X/week      PT Plan Current plan remains appropriate     Co-evaluation              AM-PAC PT "6 Clicks" Mobility   Outcome Measure  Help needed turning from your back to your side while in a flat bed without using bedrails?: A Little Help needed moving from lying on your back to sitting on the side of a flat bed without using bedrails?: A Little Help needed moving to and from a bed to a chair (including a wheelchair)?: A Lot Help needed standing up from a chair using your arms (e.g., wheelchair or bedside chair)?: A Lot Help needed to walk in hospital room?: A Little Help needed climbing 3-5 steps with a railing? : Total 6 Click Score: 14    End of Session Equipment Utilized During Treatment: Oxygen Activity Tolerance: Patient limited by fatigue Patient left: in bed;with call bell/phone within reach;with bed alarm set Nurse Communication: Mobility status PT Visit Diagnosis: Muscle weakness (generalized) (M62.81);Other abnormalities of gait and mobility (R26.89)     Time: 5400-8676 PT Time Calculation (min) (ACUTE ONLY): 24 min  Charges:  $Gait Training: 8-22 mins $Therapeutic Activity: 8-22 mins                    Ina Homes, PT, DPT Acute Rehabilitation Services  Pager [phone] Office [phone]  Malachy Chamber 03/21/2019, 5:34 PM

## 2019-03-21 NOTE — Progress Notes (Addendum)
Nutrition Follow-up  RD working remotely  DOCUMENTATION CODES:   Not applicable  INTERVENTION:    Magic cup TID with meals, each supplement provides 290 kcal and 9 grams of protein  If PO intake does not improve, rec re-insertion of Cortrak feeding tube for nutrition support  NEW NUTRITION DIAGNOSIS:   Increased nutrient needs related to acute illness as evidenced by estimated needs, ongoing  GOAL:   Patient will meet greater than or equal to 90% of their needs, unmet  MONITOR:   PO intake, Supplement acceptance, Labs, Skin, Weight trends, I & O's  ASSESSMENT:   73 yo female with PMH of DM-2, HTN, CAD from [place] who was admitted with fever and respiratory failure requiring intubation. Admission complicated by NSTEMI and new LV dysfunction.  3/18  failed extubation 3/30  extubated  3/31  DNR rescinded, re-intubated, trach  4/03  post pyloric Cortrak placed 4/08  pt vomiting, NGT placed to suction, TF stopped 4/13  abdominal pain, changed to continuous feedings 4/19  Cortrak clogged & removed, NGT placed with tip in distal stomach 4/20  SLP recommending NPO with plan for MBS at later date  Pt transferred out of 2H-ICU 4/21. S/p MBSS 4/22; SLP rec Dys 2-thin liquid diet. Spoke with RN; pt pulled Cortrak tube out last night.  Per RN, PO intake so far has been poor at 20%. Labs & medications reviewed. CBG's [id number].  Diet Order:   Diet Order            DIET DYS 2 Room service appropriate? Yes; Fluid consistency: Honey Thick  Diet effective now             EDUCATION NEEDS:   Not appropriate for education at this time  Skin:  Skin Assessment: Reviewed RN Assessment  Last BM:  4/23  Height:   Ht Readings from Last 1 Encounters:  03/08/19 5\' 6"  (1.676 m)    Weight:   Wt Readings from Last 1 Encounters:  03/21/19 73.8 kg    Ideal Body Weight:  59.1 kg  BMI:  Body mass index is 26.26 kg/m.  Estimated Nutritional Needs:   Kcal:   1600-1800  Protein:  85-100 gm  Fluid:  1.6-1.8 L  Maureen Chatters, RD, LDN Pager #: [phone] After-Hours Pager #: [phone]

## 2019-03-21 NOTE — Plan of Care (Signed)
  Problem: Education: Goal: Knowledge of General Education information will improve Description Including pain rating scale, medication(s)/side effects and non-pharmacologic comfort measures Outcome: Not Progressing   Problem: Clinical Measurements: Goal: Respiratory complications will improve Outcome: Progressing   Problem: Nutrition: Goal: Adequate nutrition will be maintained Outcome: Not Progressing   Problem: Coping: Goal: Level of anxiety will decrease Outcome: Not Progressing   Problem: Pain Managment: Goal: General experience of comfort will improve Outcome: Not Progressing

## 2019-03-21 NOTE — Progress Notes (Addendum)
NAME:  Gabrielle Wright, MRN:  [id number], DOB:  Apr 24, 1946, LOS: 45 ADMISSION DATE:  02/03/2019, CONSULTATION DATE: 02/04/2019 REFERRING MD: Emergency department physician CHIEF COMPLAINT: Fever respiratory failure  Brief History   73 year old elderly woman visiting from [place], admitted 3/10 with fevers, hypotension, found to have group A strep bacteremia, required mechanical ventilation Course complicated by non-STEMI and new LV dysfunction ? Ischemic vs Takatsubo's Failed extubation 3/18 due to acute pulmonary edema. S/p trach 3/30.  Past Medical History  Hypertension Coronary artery disease Suspected diabetes.  Significant Hospital Is Events   02/04/2019 transfer from [facility] to [facility] required intubation and pressors. 3/16 off levophed 3/18 extubated briefly but reintubated due to pulmonary edema 3/19 started milrinone 3/20 Episode of respiratory distress,  Precedex changed to propofol Atrial fibrillation RVR >>amiodarone bolus >> sinus rhythm 3/21 rising creat , AF-RVR >> amio bolus 3/24:rising creatinine 3/30: reintubated 3/31 trach 4/1 PICC  Consults:  02/04/2019 ID  IR  Cardiology  Procedures:  ETT 3/10 > 3/18, 3/18 > 3/31 Trach 3/31 > CVL 3/10 > 4/3 PICC 4/1 >   Significant Diagnostic Tests:  Echo 3/15 decreased EF 35 to 40%, LVEDP 27, no significant MR Cardiac cath >> LVEDP 40, RCA totally occluded, patent OM1 stent but distally occluded, apical LAD diffusely diseased, no intervention Chest x-ray 3/22  bilateral lower lobe airspace disease versus layering effusions CXR 3/23> pulmonary edema and bilateral pleural effusions. Echo 3/26 limited> EF 35-40%, elevated LA and LV end-diastolic pressures, Diffuse hypokinesis of LV CXR 4/1> bibasilar opacities, small R pleural effusion, tracheostomy tube present and appropriately positioned  CXR 4/9> bilateral infiltrates with R pleural effusion CXR 4/14> possible left sided infiltrate with a small  effusion, right sided atelectasis CXR 4/7> L>R airspace disease, atelectasis vs pna; inc congestion  Micro Data:  02/04/2019 blood cultures x2>> Gr A strep 02/04/2019 sputum culture>> neg 02/04/2019 urine culture>>neg 02/04/2019 respiratory virus panel>> rhinovirus 02/04/2019 flu a and B>> neg coronavirus testing 02/04/2019>>neg resp 3/21 >> Klebsiella oxytoca, co-ag negative staph Resp 4/15 and 4/18>> neg  Antimicrobials:  02/04/2019 vancomycin>>3/ 10 02/04/2019 Zosyn>> 3/10 Pen G 3/10 >> 3/22 (plan) Ceftx 3/20 >>3/24 Cipro 3/24>> 3/31  Interim history/subjective:  Sitting up in bed talking with video interpretor  . PT working with pt. Pt with PM .  No increased wob . O2 sats 100%. Doing well with trach collar.  Drinking liquids without apparent difficulty .  ST request downsize trach to #4 cuffless.   Objective   Blood pressure (!) 125/50, pulse 90, temperature 98.1 F (36.7 C), temperature source Oral, resp. rate (!) 21, height 5\' 6"  (1.676 m), weight 73.8 kg, SpO2 100 %.    FiO2 (%):  [28 %] 28 %   Intake/Output Summary (Last 24 hours) at 03/21/2019 1148 Last data filed at 03/21/2019 1840 Gross per 24 hour  Intake -  Output 851 ml  Net -851 ml   Filed Weights   03/18/19 0500 03/19/19 0410 03/21/19 0600  Weight: 72 kg 73.4 kg 73.8 kg   Physical Exam: General: Chronically ill-appearing, no acute distress HENT: [place], AT, OP clear, MMM Neck: Trach in place, c/d/i-PM in place Eyes: EOMI, no scleral icterus Respiratory: Clear to auscultation bilaterally.  No crackles, wheezing or rales Cardiovascular: RRR, -M/R/G, no JVD GI: BS+, soft, nontender Extremities:-Edema,-tenderness Neuro: Awake and following commands  CXR 4/24 decreased vascular congestion , LLL opacity   Assessment & Plan:   Chronic respiratory failure s/p trach  Placed on 3/31. Tolerating  trach collar  -Downsize to #4 cuffless trach  -Routine trach care -Schedule mucinex x 3d followed by PRN -PRN duonebs  -CPT  Dysphagia -Dysphagia diet  Atrial fibrillation Ischemic CM Anemia DM2 -Per primary  GI ppx: Protonix DVT ppx: SCDs  Diet: Enteral Nutrition Code Status: full   Tammy Parrett NP-C  [facility]  [phone]  03/21/2019    Attending Note:  73 year old female with tracheostomy placed due to strep a bacteremia and pneumonia causing prolonged respiratory failure.  No events overnight.  On exam, lungs are clear and patient has a PMV on with a cuffed 6 trach in place.  I reviewed CXR myself, trach is in a good position.  Discussed with PCCM-NP.  Hypoxemia:  - Titrate O2 for sat of 88-92%  - May need an ambulatory desaturation study prior to discharge  Trach status:  - Downsize to a cuffless 4 trach  - Will re-evaluate next week, if continues to do well over the weekend then can cap on Monday  - If capped for 2 nights then will consider decannulation on Wednesday   Dysphagia:  - Downsize trach as above should assist  PCCM will see again on Monday   Patient seen and examined, agree with above note.  I dictated the care and orders written for this patient under my direction.  Alyson Reedy, MD [phone]

## 2019-03-21 NOTE — Progress Notes (Signed)
Inpatient Rehab Admissions:  Inpatient Rehab Consult received.  I met with patient at the bedside for rehabilitation assessment and to discuss goals and expectations of an inpatient rehab admission.  Used stratus interpreter (Rhitu [id number]).  Pt states that she is very old and does not wish to live very much longer.  I did speak with her daughter, who is interested in an inpatient rehab stay when pt is medically ready.  Discussed with RN and recommended Palliative Care Consult to discuss goals of care with pt and family prior to any potential inpatient rehab admission.  Will follow up on Monday.   Signed:  , PT, DPT Admissions Coordinator [phone] 03/21/19  12:53 PM     

## 2019-03-22 LAB — BASIC METABOLIC PANEL
Anion gap: 9 (ref 5–15)
BUN: 30 mg/dL — ABNORMAL HIGH (ref 8–23)
CO2: 28 mmol/L (ref 22–32)
Calcium: 8.3 mg/dL — ABNORMAL LOW (ref 8.9–10.3)
Chloride: 100 mmol/L (ref 98–111)
Creatinine, Ser: 1.07 mg/dL — ABNORMAL HIGH (ref 0.44–1.00)
GFR calc Af Amer: >60 mL/min (ref >60)
GFR calc non Af Amer: 52 mL/min — ABNORMAL LOW (ref >60)
Glucose, Bld: 95 mg/dL (ref 70–99)
Potassium: 3.6 mmol/L (ref 3.5–5.1)
Sodium: 137 mmol/L (ref 135–145)

## 2019-03-22 LAB — GLUCOSE, CAPILLARY
Glucose-Capillary: 110 mg/dL — ABNORMAL HIGH (ref 70–99)
Glucose-Capillary: 119 mg/dL — ABNORMAL HIGH (ref 70–99)
Glucose-Capillary: 119 mg/dL — ABNORMAL HIGH (ref 70–99)
Glucose-Capillary: 120 mg/dL — ABNORMAL HIGH (ref 70–99)
Glucose-Capillary: 189 mg/dL — ABNORMAL HIGH (ref 70–99)
Glucose-Capillary: 96 mg/dL (ref 70–99)

## 2019-03-22 LAB — CBC
HCT: 27 % — ABNORMAL LOW (ref 36.0–46.0)
Hemoglobin: 8.1 g/dL — ABNORMAL LOW (ref 12.0–15.0)
MCH: 25.9 pg — ABNORMAL LOW (ref 26.0–34.0)
MCHC: 30 g/dL (ref 30.0–36.0)
MCV: 86.3 fL (ref 80.0–100.0)
Platelets: 217 10*3/uL (ref 150–400)
RBC: 3.13 MIL/uL — ABNORMAL LOW (ref 3.87–5.11)
RDW: 16.6 % — ABNORMAL HIGH (ref 11.5–15.5)
WBC: 7.2 10*3/uL (ref 4.0–10.5)
nRBC: 0 % (ref 0.0–0.2)

## 2019-03-22 MED ORDER — SPIRONOLACTONE 12.5 MG HALF TABLET
12.5000 mg | ORAL_TABLET | Freq: Every day | ORAL | Status: DC
  Administered 2019-03-22 – 2019-03-27 (×6): 12.5 mg via ORAL
  Filled 2019-03-22 (×6): qty 1

## 2019-03-22 MED ORDER — FUROSEMIDE 40 MG PO TABS
40.0000 mg | ORAL_TABLET | Freq: Every day | ORAL | Status: DC
  Administered 2019-03-22 – 2019-03-27 (×6): 40 mg via ORAL
  Filled 2019-03-22 (×6): qty 1

## 2019-03-22 NOTE — Progress Notes (Signed)
PROGRESS NOTE    Gabrielle Wright  NWG:[id number] DOB: 1946-08-27 DOA: 02/03/2019 PCP: Rudene Anda, MD   Brief Narrative: 73 year old female PMHxfrom [place].HTN , CAD, DM Type 2? Noted to have a high fever 103.9. Reported to have cough. Taken to [place] center per family. Was supported to have a urinary tract infection although her urine was clean. She never filled her prescription for antibiotics. Due to persistent hypotension and refractory nature of her hypertension she is transferred to [facility] 02/04/2019. She was in respiratory distress for abdominal chest wall paradoxus. She required intubation and central line placement. She had maxed out on peripheral IV vasopressors therefore central line was placed. Pulmonary critical care will be responsible for care at this time. She was placed in isolation since she came from  [place] with recent reports of coronavirus and that country. . Infectious disease was called and they reported again diagnosis done on 1 reported countries.  4/21: Patient has done quite well on trach collar for over 24 hours and has had no further anxiety noted.  She was  transfer to progressive care. Patient pass swallow evaluation. She removed NG tube.  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  -At this post trach on 3/30 first. -Tolerating trach collar. 28 % 5 L.  -Having some thick secretion.  She was stared on Mucinex. CCM will follow on 4-24 to evaluate need to change cuff.  -Secondary to ischemic  cardiomyopathy and pulmonary edema -CCM following. -Chest x ray with atelectasis/ left Lower lobe.  - Nebulizer.  -Tolerating  cuff to 4 (placed 4/24). Them on Monday plan to cap cuff for two nights.  -stable.   Ischemic cardiomyopathy acute on chronic systolic and diastolic heart failure exacerbation: Echocardiogram with ejection fraction 35 to 40%, left diffuse hypokinesis. Underwent cardiac cath with no targets for revascularization. Cardiology recommending ACE, spironolactone and Lasix. Creatinine up today, will hold IV Lasix today and spironolactone and resume tomorrow as needed. Continue with cozaar. Monitor renal function.  Resume oral lasix and spironolactone.   A. fib with RVR resolved: Not on anticoagulation per cardiology drop in hemoglobin.  Hypernatremia: resolved.   Diabetes type 2 with complication: Continue with Lantus, sliding scale insulin.  Agitation anxiety On clonazepam PRN, increase to 1 mg  Continue with Zoloft    Dysphagia [place] swallow evaluation.  On dysphagia diet 2.      Nutrition Problem: Increased nutrient needs Etiology: acute illness    Signs/Symptoms: estimated needs    Interventions: Magic cup  Estimated body mass index is 26.26 kg/m as calculated from the following:   Height as of this encounter: _0  (1.676 m).   Weight as of this encounter: 73.8 kg.   DVT prophylaxis: SCDs Code Status: Full code Family Communication Disposition Plan: CIR evaluation for admission.   Consultants:   CCM  Cardiology   Procedures:   Cath  Trach   Antimicrobials:  Subjective: She appears more comfortable today. Less anxious.  I spoke with her about about CIR, she wants to speak with her daughter.  I use translator.  She report cough at bed time. Cant sleep because of the cough.    Objective: Vitals:    03/22/19 0730 03/22/19 0745 03/22/19 0746 03/22/19 0753  BP:    (!) 111/55  Pulse: 76  77 82  Resp: _0 Temp:    98 F (36.7 C)  TempSrc:    Oral  SpO2: 99% 99% 97% 100%  Weight:      Height:        Intake/Output Summary (Last 24 hours) at 03/22/2019 1107 Last data filed at 03/22/2019 0730 Gross per 24 hour  Intake 950 ml  Output --  Net 950 ml   Filed Weights   03/21/19 0600 03/22/19 0352 03/22/19 0438  Weight: 73.8 kg 74.1 kg 73.8 kg    Examination:  General exam: NAD Respiratory system: Trach in place. Few crackles.  Cardiovascular system: S 1, S 2 RRR Gastrointestinal system: BS present, soft, nt Central nervous system: alert, follow command Extremities: Symmetric power.  Skin: No rashes.     Data Reviewed: I have personally reviewed following labs and imaging studies  CBC: Recent Labs  Lab 03/18/19 0205 03/19/19 0535 03/20/19 0736 03/21/19 0447 03/22/19 0736  WBC 9.3 8.4 11.0* 9.7 7.2  HGB 8.8* 9.1* 9.0* 8.6* 8.1*  HCT 31.5* 31.4* 30.8* 29.3* 27.0*  MCV 90.8 90.2 89.0 88.5 86.3  PLT 280 282 270 248 309   Basic Metabolic Panel: Recent Labs  Lab 03/16/19 0353 03/17/19 0620 03/18/19 0205 03/19/19 0535 03/20/19 0736 03/21/19 0447 03/22/19 0736  NA 144 146* 148* 147* 147* 142 137  K 4.2 4.0 4.2 4.7 4.3 4.2 3.6  CL 105 106 109 105 105 101 100  CO2 27 33* _1 GLUCOSE 269* 148* 212* 246* 81 104* 95  BUN 50* 43* 48* 54* 52* 39* 30*  CREATININE 1.29* 1.17* 1.08* 1.29* 1.28* 1.19* 1.07*  CALCIUM 8.3* 8.6* 8.9 8.9 9.1 9.0 8.3*  MG 2.9* 2.7* 2.9*  --   --   --   --    GFR: Estimated Creatinine Clearance: 48.8 mL/min (A) (by C-G formula based on SCr of 1.07 mg/dL (H)). Liver Function Tests: No results for input(s): AST, ALT, ALKPHOS, BILITOT, PROT, ALBUMIN in the last 168 hours. No results for input(s): LIPASE, AMYLASE in the last 168 hours. No results for input(s): AMMONIA in the last 168 hours. Coagulation Profile: No results  for input(s): INR, PROTIME in the last 168 hours. Cardiac Enzymes: No results for input(s): CKTOTAL, CKMB, CKMBINDEX, TROPONINI in the last 168 hours. BNP (last 3 results) No results for input(s): PROBNP in the last 8760 hours. HbA1C: No results for input(s): HGBA1C in the last 72 hours. CBG: Recent Labs  Lab 03/21/19 1628 03/21/19 1957 03/22/19 0003 03/22/19 0354 03/22/19 0742  GLUCAP 212* 132* 119* 110* 96   Lipid Profile: No results for input(s): CHOL, HDL, LDLCALC, TRIG, CHOLHDL, LDLDIRECT in the last 72 hours. Thyroid Function Tests: No results for input(s): TSH, T4TOTAL, FREET4, T3FREE, THYROIDAB in the last 72 hours. Anemia Panel: No results for input(s): VITAMINB12, FOLATE, FERRITIN, TIBC, IRON, RETICCTPCT in the last 72 hours. Sepsis Labs: No results for input(s): PROCALCITON, LATICACIDVEN in the last 168 hours.  Recent Results (from the past 240 hour(s))  Culture, respiratory (non-expectorated)     Status: None  Collection Time: 03/15/19  9:00 AM  Result Value Ref Range Status   Specimen Description TRACHEAL ASPIRATE  Final   Special Requests NONE  Final   Gram Stain   Final    RARE WBC PRESENT, PREDOMINANTLY PMN FEW GRAM POSITIVE RODS FEW GRAM POSITIVE COCCI IN PAIRS RARE GRAM NEGATIVE RODS    Culture   Final    FEW Consistent with normal respiratory flora. Performed at [facility], [place] [address], [place], [place] [address]    Report Status 03/17/2019 FINAL  Final         Radiology Studies: Dg Chest Port 1 View  Result Date: 03/21/2019 CLINICAL DATA:  73 year old female with shortness of breath. Trach collar. EXAM: PORTABLE CHEST 1 VIEW COMPARISON:  03/17/2019 and earlier. FINDINGS: Portable AP upright view at 1025 hours. Stable tracheostomy. Right PICC line has been removed. Enteric tube has been removed. Continued low lung volumes. Regressed pulmonary interstitial opacity/vascular congestion since 03/17/2019, but continued hypo ventilation at  the left lung base with patchy opacity. Stable cardiac size and mediastinal contours. No pneumothorax. Paucity of bowel gas in the upper abdomen. IMPRESSION: 1. Regressed pulmonary vascular congestion but continued low lung volumes with left lung base atelectasis or consolidation. 2. Enteric tube removed. Right PICC line removed. Electronically Signed   By: Genevie Ann M.D.   On: 03/21/2019 10:39        Scheduled Meds:  albuterol  2.5 mg Nebulization TID   amiodarone  200 mg Oral Daily   aspirin  81 mg Oral Daily   atorvastatin  40 mg Oral q1800   carvedilol  3.125 mg Oral BID WC   chlorhexidine gluconate (MEDLINE KIT)  15 mL Mouth Rinse BID   Chlorhexidine Gluconate Cloth  6 each Topical Q0600   furosemide  40 mg Oral Daily   hydrALAZINE  10 mg Oral Q8H   insulin aspart  0-24 Units Subcutaneous Q4H   insulin glargine  25 Units Subcutaneous QHS   isosorbide dinitrate  10 mg Oral TID   losartan  25 mg Oral Daily   mouth rinse  15 mL Mouth Rinse BID   montelukast  10 mg Oral QHS   multivitamin with minerals  1 tablet Oral Daily   pantoprazole sodium  40 mg Oral Daily   polyethylene glycol  17 g Oral BID   sertraline  50 mg Oral Daily   spironolactone  12.5 mg Oral Daily   traZODone  50 mg Oral QHS   Continuous Infusions:  sodium chloride Stopped (03/04/19 0815)     LOS: 46 days    Time spent: 35 minutes.     Elmarie Shiley, MD [facility] Pager [phone] If 7PM-7AM, please contact night-coverage [email or web address] Password [facility] 03/22/2019, 11:07 AM

## 2019-03-22 NOTE — Progress Notes (Signed)
Physical Therapy Treatment Patient Details Name: Gabrielle Wright MRN: [id number] DOB: [birth year] Today's Date: 03/22/2019    History of Present Illness 73 y.o. female admitted on 02/03/19 for fever and cough (to [facility]) and was transferred to [facility] on 02/04/19 for hypotension and respiratory distress (acute respiratory failure/pulmonary edema) requiring intubation 3/10-current (time of PT eval 02/27/2019).  She was visiting family from [place] and COVID 19 tests were negative.  She was found to have group A strep bacteremia (septic shiock), rhinovirus,  and course complicated by NSTEMI and new LV dysfunction s/p cardiac cath on 02/09/19.  Cardiology following.  Other dx include ischemic cardiomyopathy, hyperkalemia, DM2- uncontrolled, and abdominal distention.  Pt with significant PMH of DM, HTN, anc CAD. Trach placed on 3/31    PT Comments    Patient eager to return to breakfast during visit, limited session. Worked on sit to stands x5 from recliner, posterior LOB from RW back onto chair. Cont to rec CIR.   Follow Up Recommendations  CIR;Supervision for mobility/OOB     Equipment Recommendations  Other (comment)    Recommendations for Other Services Rehab consult     Precautions / Restrictions Precautions Precautions: Fall Precaution Comments: trach, on 28%  Restrictions Weight Bearing Restrictions: No    Mobility  Bed Mobility Overal bed mobility: Needs Assistance Bed Mobility: Sit to Supine Rolling: Mod assist Sidelying to sit: Mod assist Supine to sit: Mod assist;HOB elevated     General bed mobility comments: pt doing very well with mobility  Transfers Overall transfer level: Needs assistance Equipment used: Rolling walker (2 wheeled) Transfers: Sit to/from Stand Sit to Stand: Mod assist         General transfer comment: Mod A to stand, patient stood for 10 seconds with posterior LOB back onto chair.   Ambulation/Gait                 Stairs              Wheelchair Mobility    Modified Rankin (Stroke Patients Only)       Balance Overall balance assessment: Needs assistance Sitting-balance support: Feet supported;No upper extremity supported Sitting balance-Leahy Scale: Fair     Standing balance support: Bilateral upper extremity supported;During functional activity Standing balance-Leahy Scale: Fair Standing balance comment: reliant on B UE and external support                             Cognition Arousal/Alertness: Awake/alert Behavior During Therapy: WFL for tasks assessed/performed Overall Cognitive Status: Difficult to assess                                        Exercises General Exercises - Lower Extremity Long Arc Quad: AROM;Both;Seated;10 reps    General Comments        Pertinent Vitals/Pain Faces Pain Scale: No hurt    Home Living                      Prior Function            PT Goals (current goals can now be found in the care plan section) Acute Rehab PT Goals Patient Stated Goal: not stated today PT Goal Formulation: With patient Time For Goal Achievement: 03/31/19 Potential to Achieve Goals: Fair Progress towards PT goals: Progressing toward goals  Frequency    Min 3X/week      PT Plan Current plan remains appropriate    Co-evaluation              AM-PAC PT "6 Clicks" Mobility   Outcome Measure  Help needed turning from your back to your side while in a flat bed without using bedrails?: A Little Help needed moving from lying on your back to sitting on the side of a flat bed without using bedrails?: A Little Help needed moving to and from a bed to a chair (including a wheelchair)?: A Lot Help needed standing up from a chair using your arms (e.g., wheelchair or bedside chair)?: A Lot Help needed to walk in hospital room?: A Little Help needed climbing 3-5 steps with a railing? : Total 6 Click Score: 14    End of  Session Equipment Utilized During Treatment: Oxygen Activity Tolerance: Patient limited by fatigue Patient left: in bed;with call bell/phone within reach;with bed alarm set Nurse Communication: Mobility status PT Visit Diagnosis: Muscle weakness (generalized) (M62.81);Other abnormalities of gait and mobility (R26.89)     Time: 1020-1040 PT Time Calculation (min) (ACUTE ONLY): 20 min  Charges:  $Therapeutic Activity: 8-22 mins                     Etta Grandchild, PT, DPT Acute Rehabilitation Services Pager: [phone] Office: [phone]     Etta Grandchild 03/22/2019, 10:40 AM

## 2019-03-22 NOTE — Progress Notes (Signed)
SLP Cancellation Note  Patient Details Name: Gabrielle Wright MRN: [id number] DOB: July 12, 1946   Cancelled treatment:       Reason Eval/Treat Not Completed: Patient declined, no reason specified. Pt refused PO, could not get interpreter device functional. Wearing PMSV successfully and notes to have improved vocal quality and breath support. Appears more comfortable. Will f/u for ongoing interventions.   Harlon Ditty, MA CCC-SLP  Acute Rehabilitation Services Pager [phone] Office [phone]   Claudine Mouton 03/22/2019, 12:02 PM

## 2019-03-22 NOTE — Progress Notes (Signed)
Daughter brought cell phone in for mother to have at bedside. Phone placed on the charger in room. Daughter called and updated on patients condition today. VS currently stable. Patient c/o anxiety and feeling restless asked for Ativan via interpreter. Administered prn medication as ordered. Patient is currently resting well. Will continue to monitor.

## 2019-03-22 NOTE — Care Management (Signed)
Reviewed CIR and CSW notes. Requested Dr Sunnie Nielsen to consider palliative consult for GOC as recommended by CIR. MD will address with patient's daughter today. CM will continue to follow.

## 2019-03-22 NOTE — Progress Notes (Signed)
Patient was able to video chat with daughter today. Both agree that patient will transfer to in patient rehab when ready. Patients VS are currently stable. Will continue to monitor.

## 2019-03-23 LAB — GLUCOSE, CAPILLARY
Glucose-Capillary: 115 mg/dL — ABNORMAL HIGH (ref 70–99)
Glucose-Capillary: 178 mg/dL — ABNORMAL HIGH (ref 70–99)
Glucose-Capillary: 235 mg/dL — ABNORMAL HIGH (ref 70–99)
Glucose-Capillary: 237 mg/dL — ABNORMAL HIGH (ref 70–99)
Glucose-Capillary: 59 mg/dL — ABNORMAL LOW (ref 70–99)
Glucose-Capillary: 82 mg/dL (ref 70–99)
Glucose-Capillary: 83 mg/dL (ref 70–99)
Glucose-Capillary: 92 mg/dL (ref 70–99)

## 2019-03-23 MED ORDER — INSULIN GLARGINE 100 UNIT/ML ~~LOC~~ SOLN
15.0000 [IU] | Freq: Every day | SUBCUTANEOUS | Status: DC
  Administered 2019-03-23 – 2019-03-26 (×4): 15 [IU] via SUBCUTANEOUS
  Filled 2019-03-23 (×5): qty 0.15

## 2019-03-23 MED ORDER — LORAZEPAM 0.5 MG PO TABS
0.5000 mg | ORAL_TABLET | Freq: Four times a day (QID) | ORAL | Status: DC | PRN
  Administered 2019-03-23 – 2019-03-26 (×9): 0.5 mg via ORAL
  Filled 2019-03-23 (×9): qty 1

## 2019-03-23 NOTE — Progress Notes (Signed)
Patient ID: Gabrielle Wright, female   DOB: Jul 17, 1946, 73 y.o.   MRN: [id number]  PROGRESS NOTE    Gabrielle Wright  FWY:[id number] DOB: 1946/09/17 DOA: 02/03/2019 PCP: Rudene Anda, MD   Brief Narrative:  73 year old female from [place] with history of hypertension, CAD with STEMI/stenting in 2018, diabetes mellitus type 2 presented on 02/03/2019 with fever, cough, hypoxia and hypotension.  She was in respiratory distress and required intubation and vasopressors.  COVID-19 test was negative.  She has had a long hospitalization.  She was found to have group A strep bacteremia.  Course was complicated by non-STEMI and new LV dysfunction.  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  Also had intermittent episodes of A. fib with RVR.  She failed extubation trial and a tracheostomy tube placed on 02/24/2019.  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  PCCM following intermittently -PCCM downsized trach to cuffless 4 trach.  They will again reevaluate tomorrow to see if trach can be capped.  If capped for 2 nights, PCCM will consider decannulation on Wednesday -Currently on 5 L oxygen -Continue nebs and montelukast -Pulmonary toilet  Ischemic cardiomyopathy with acute on chronic systolic and  diastolic heart failure exacerbation -Echo showed EF of 35 to 40% with left diffuse hypokinesis -Underwent cardiac cath with no targets for revascularization -Cardiology recommended medical management   Cardiology has signed off.  Outpatient follow-up with cardiology. -Continue Lasix, Coreg, hydralazine, spironolactone, losartan, isosorbide dinitrate and amiodarone. -Strict input and output.  Daily weights.  CAD/non-STEMI -Continue aspirin.  Plavix was discontinued by cardiology because of anemia   Paroxysmal A. fib with RVR -Resolved.  Currently in sinus rhythm and rate controlled -Continue amiodarone and Coreg.  Not on anticoagulation due to anemia.  Outpatient follow-up with cardiology  Chronic anemia -Resolved  Diabetes mellitus type 2 -Blood sugar stable.  Decrease Lantus to 15 units nightly.  Continue sliding scale insulin  Agitation/anxiety -Continue Zoloft and as needed Ativan  Dysphagia -Diet as per SLP recommendations.  Generalized deconditioning--might need CIR -Overall prognosis is guarded.  Palliative care evaluation is pending  Group A strep bacteremia -Completed antibiotic treatment -Blood cultures from 02/04/2019 are negative so far  DVT prophylaxis: SCDs Code Status: Full Family Communication: None at bedside Disposition Plan: CIR once respiratory status stable.  Consultants:  PCCM/cardiology/palliative care IR/ID Procedures:  Echo on 02/09/2019   1. The left ventricle has moderately reduced systolic function, with an ejection fraction of 35-40%. The cavity size was normal. Left ventricular diastolic Doppler parameters are consistent with pseudonormalization. Elevated left ventricular  end-diastolic pressure The E/e' is 52. Regional wall motion abnormalities (see coded diagram below).  2. The right ventricle has mildly reduced systolic function. The cavity was normal. There is no increase in right ventricular wall thickness.  3. No hemodynamically  significant valvular heart disease.  4. Normal biatrial chamber  size.  5. IVC not well visualized.  Cardiac catheterization on 02/09/2019  2nd Mrg-3 lesion is 80% stenosed.  Acute Mrg lesion is 90% stenosed.    Less than 25% left main  Proximal diffuse 30 to 40% narrowing within the previously placed LAD stent.  The entire apical LAD is diffusely diseased with up to 85% stenosis.  The first diagonal contains 85% ostial narrowing.  Circumflex contains diffuse proximal to mid eccentric 50% stenosis the dominant second obtuse marginal contains mid vessel 50% stenosis in the mid to distal portion of this obtuse marginal there is a patent stent with 20% narrowing, and just distal to the stent there is an 80% thrombotic stenosis.  The RCA is dominant containing 50% mid vessel stenosis, 70% distal stenosis, 70% ostial PDA stenosis, and total occlusion of the mid vessel that fills by collaterals from the left ventricular apex.  Left ventriculography is not performed.  Today's echo was reviewed and there is anteroapical severe hypokinesis and estimated ejection fraction of 40%.  LVEDP by Cath Lab hemodynamics is 40 mmHg.  RECOMMENDATIONS:   Acute on chronic combined systolic and diastolic heart failure with extremely elevated LVEDP greater than or equal to 40 mmHg related to global ischemia in the setting of norepinephrine therapy.  Recommend clopidogrel therapy if possible given stents in multiple territories and evidence of active thrombus beyond the stent in the obtuse marginal.  Current anatomy does not suggest that a role for coronary intervention. 35 cc of contrast used.  Given diabetes/shock state/intrinsic kidney disease, a bump in creatinine is still highly likely.  Kidney function needs to be monitored.  Limited echo on 02/20/2019 IMPRESSIONS    1. The left ventricle has moderately reduced systolic function, with an ejection fraction of 35-40%. Left ventricular diastolic Doppler  parameters are consistent with restrictive filling. Elevated left atrial and left ventricular end-diastolic pressures  The E/e' is 34. Left ventricular diffuse hypokinesis.  2. Severe akinesis of the left ventricular, entire apical segment.  3. Severe akinesis of the left ventricular, mid-apical anteroseptal wall and anterior wall.  4. Moderate pleural effusion in both left and right lateral regions.  5. There is mild mitral annular calcification present.  6. The aortic valve is grossly normal Aortic valve regurgitation is mild by color flow Doppler.  7. The inferior vena cava was normal in size with <50% respiratory variability.  8. Left atrial size was not assessed.  9. The ascending aorta and aortic root are normal in size and structure.  Intubation/extubation/reintubation  Tracheostomy on 02/24/2019  Subjective: Patient seen and examined at bedside.  Used Bengali audio interpreter via iPad.  Patient feels uncomfortable around the trach.  No overnight fever or vomiting reported  Objective: Vitals:   03/23/19 0500 03/23/19 0806 03/23/19 0900 03/23/19 0925  BP:   (!) 110/46   Pulse:  78    Resp:  18    Temp:   98.5 F (36.9 C)   TempSrc:   Oral   SpO2: 100% 100%  99%  Weight:      Height:        Intake/Output Summary (Last 24 hours) at 03/23/2019 1044 Last data filed at 03/23/2019 0419 Gross per 24 hour  Intake -  Output 500 ml  Net -500 ml   Filed Weights   03/22/19 0352 03/22/19 0438 03/23/19 0441  Weight: 74.1 kg 73.8 kg 74.2 kg    Examination:  General exam: Appears calm and comfortable.  Looks chronically ill. Respiratory system: Bilateral decreased  breath sounds at bases with scattered crackles.  Trach in place Cardiovascular system: S1 & S2 heard, Rate controlled Gastrointestinal system: Abdomen is nondistended, soft and nontender. Normal bowel sounds heard. Extremities: No cyanosis, clubbing; trace edema      Data Reviewed: I have personally reviewed  following labs and imaging studies  CBC: Recent Labs  Lab 03/18/19 0205 03/19/19 0535 03/20/19 0736 03/21/19 0447 03/22/19 0736  WBC 9.3 8.4 11.0* 9.7 7.2  HGB 8.8* 9.1* 9.0* 8.6* 8.1*  HCT 31.5* 31.4* 30.8* 29.3* 27.0*  MCV 90.8 90.2 89.0 88.5 86.3  PLT 280 282 270 248 505   Basic Metabolic Panel: Recent Labs  Lab 03/17/19 0620 03/18/19 0205 03/19/19 0535 03/20/19 0736 03/21/19 0447 03/22/19 0736  NA 146* 148* 147* 147* 142 137  K 4.0 4.2 4.7 4.3 4.2 3.6  CL 106 109 105 105 101 100  CO2 33* _0 GLUCOSE 148* 212* 246* 81 104* 95  BUN 43* 48* 54* 52* 39* 30*  CREATININE 1.17* 1.08* 1.29* 1.28* 1.19* 1.07*  CALCIUM 8.6* 8.9 8.9 9.1 9.0 8.3*  MG 2.7* 2.9*  --   --   --   --    GFR: Estimated Creatinine Clearance: 49 mL/min (A) (by C-G formula based on SCr of 1.07 mg/dL (H)). Liver Function Tests: No results for input(s): AST, ALT, ALKPHOS, BILITOT, PROT, ALBUMIN in the last 168 hours. No results for input(s): LIPASE, AMYLASE in the last 168 hours. No results for input(s): AMMONIA in the last 168 hours. Coagulation Profile: No results for input(s): INR, PROTIME in the last 168 hours. Cardiac Enzymes: No results for input(s): CKTOTAL, CKMB, CKMBINDEX, TROPONINI in the last 168 hours. BNP (last 3 results) No results for input(s): PROBNP in the last 8760 hours. HbA1C: No results for input(s): HGBA1C in the last 72 hours. CBG: Recent Labs  Lab 03/22/19 1941 03/23/19 0007 03/23/19 0415 03/23/19 0445 03/23/19 0808  GLUCAP 120* 115* 83 92 82   Lipid Profile: No results for input(s): CHOL, HDL, LDLCALC, TRIG, CHOLHDL, LDLDIRECT in the last 72 hours. Thyroid Function Tests: No results for input(s): TSH, T4TOTAL, FREET4, T3FREE, THYROIDAB in the last 72 hours. Anemia Panel: No results for input(s): VITAMINB12, FOLATE, FERRITIN, TIBC, IRON, RETICCTPCT in the last 72 hours. Sepsis Labs: No results for input(s): PROCALCITON, LATICACIDVEN in the last 168  hours.  Recent Results (from the past 240 hour(s))  Culture, respiratory (non-expectorated)     Status: None   Collection Time: 03/15/19  9:00 AM  Result Value Ref Range Status   Specimen Description TRACHEAL ASPIRATE  Final   Special Requests NONE  Final   Gram Stain   Final    RARE WBC PRESENT, PREDOMINANTLY PMN FEW GRAM POSITIVE RODS FEW GRAM POSITIVE COCCI IN PAIRS RARE GRAM NEGATIVE RODS    Culture   Final    FEW Consistent with normal respiratory flora. Performed at [facility], [place] [address], [place], [place] [address]    Report Status 03/17/2019 FINAL  Final         Radiology Studies: No results found.      Scheduled Meds: . albuterol  2.5 mg Nebulization TID  . amiodarone  200 mg Oral Daily  . aspirin  81 mg Oral Daily  . atorvastatin  40 mg Oral q1800  . carvedilol  3.125 mg Oral BID WC  . chlorhexidine gluconate (MEDLINE KIT)  15 mL Mouth Rinse BID  . Chlorhexidine Gluconate Cloth  6 each  Topical Q0600  . furosemide  40 mg Oral Daily  . hydrALAZINE  10 mg Oral Q8H  . insulin aspart  0-24 Units Subcutaneous Q4H  . insulin glargine  25 Units Subcutaneous QHS  . isosorbide dinitrate  10 mg Oral TID  . losartan  25 mg Oral Daily  . mouth rinse  15 mL Mouth Rinse BID  . montelukast  10 mg Oral QHS  . multivitamin with minerals  1 tablet Oral Daily  . pantoprazole sodium  40 mg Oral Daily  . polyethylene glycol  17 g Oral BID  . sertraline  50 mg Oral Daily  . spironolactone  12.5 mg Oral Daily  . traZODone  50 mg Oral QHS   Continuous Infusions: . sodium chloride Stopped (03/04/19 0815)     LOS: 64 days        Aline August, MD [facility] 03/23/2019, 10:44 AM

## 2019-03-24 DIAGNOSIS — Z7189 Other specified counseling: Secondary | ICD-10-CM

## 2019-03-24 DIAGNOSIS — Z515 Encounter for palliative care: Secondary | ICD-10-CM

## 2019-03-24 LAB — CBC WITH DIFFERENTIAL/PLATELET
Abs Immature Granulocytes: 0.04 10*3/uL (ref 0.00–0.07)
Basophils Absolute: 0 10*3/uL (ref 0.0–0.1)
Basophils Relative: 0 %
Eosinophils Absolute: 0.1 10*3/uL (ref 0.0–0.5)
Eosinophils Relative: 1 %
HCT: 27.3 % — ABNORMAL LOW (ref 36.0–46.0)
Hemoglobin: 8.3 g/dL — ABNORMAL LOW (ref 12.0–15.0)
Immature Granulocytes: 1 %
Lymphocytes Relative: 31 %
Lymphs Abs: 2.2 10*3/uL (ref 0.7–4.0)
MCH: 25.9 pg — ABNORMAL LOW (ref 26.0–34.0)
MCHC: 30.4 g/dL (ref 30.0–36.0)
MCV: 85.3 fL (ref 80.0–100.0)
Monocytes Absolute: 0.7 10*3/uL (ref 0.1–1.0)
Monocytes Relative: 10 %
Neutro Abs: 3.9 10*3/uL (ref 1.7–7.7)
Neutrophils Relative %: 57 %
Platelets: 232 10*3/uL (ref 150–400)
RBC: 3.2 MIL/uL — ABNORMAL LOW (ref 3.87–5.11)
RDW: 16.6 % — ABNORMAL HIGH (ref 11.5–15.5)
WBC: 6.9 10*3/uL (ref 4.0–10.5)
nRBC: 0 % (ref 0.0–0.2)

## 2019-03-24 LAB — GLUCOSE, CAPILLARY
Glucose-Capillary: 121 mg/dL — ABNORMAL HIGH (ref 70–99)
Glucose-Capillary: 134 mg/dL — ABNORMAL HIGH (ref 70–99)
Glucose-Capillary: 149 mg/dL — ABNORMAL HIGH (ref 70–99)
Glucose-Capillary: 166 mg/dL — ABNORMAL HIGH (ref 70–99)
Glucose-Capillary: 185 mg/dL — ABNORMAL HIGH (ref 70–99)
Glucose-Capillary: 191 mg/dL — ABNORMAL HIGH (ref 70–99)
Glucose-Capillary: 223 mg/dL — ABNORMAL HIGH (ref 70–99)

## 2019-03-24 LAB — BASIC METABOLIC PANEL
Anion gap: 11 (ref 5–15)
BUN: 24 mg/dL — ABNORMAL HIGH (ref 8–23)
CO2: 29 mmol/L (ref 22–32)
Calcium: 8.6 mg/dL — ABNORMAL LOW (ref 8.9–10.3)
Chloride: 98 mmol/L (ref 98–111)
Creatinine, Ser: 1.31 mg/dL — ABNORMAL HIGH (ref 0.44–1.00)
GFR calc Af Amer: 47 mL/min — ABNORMAL LOW (ref >60)
GFR calc non Af Amer: 41 mL/min — ABNORMAL LOW (ref >60)
Glucose, Bld: 148 mg/dL — ABNORMAL HIGH (ref 70–99)
Potassium: 3.6 mmol/L (ref 3.5–5.1)
Sodium: 138 mmol/L (ref 135–145)

## 2019-03-24 LAB — MAGNESIUM: Magnesium: 2.1 mg/dL (ref 1.7–2.4)

## 2019-03-24 NOTE — Progress Notes (Addendum)
NAME:  Gabrielle Wright, MRN:  [id number], DOB:  1946-05-12, LOS: 48 ADMISSION DATE:  02/03/2019, CONSULTATION DATE: 02/04/2019 REFERRING MD: Emergency department physician CHIEF COMPLAINT: Fever respiratory failure  Brief History   73 year old elderly woman visiting from [place], admitted 3/10 with fevers, hypotension, found to have group A strep bacteremia, required mechanical ventilation Course complicated by non-STEMI and new LV dysfunction ? Ischemic vs Takatsubo's Failed extubation 3/18 due to acute pulmonary edema. S/p trach 3/30.  Past Medical History  Hypertension Coronary artery disease Suspected diabetes.  Significant Hospital Is Events   02/04/2019 transfer from [facility] to [facility] required intubation and pressors. 3/16 off levophed 3/18 extubated briefly but reintubated due to pulmonary edema 3/19 started milrinone 3/20 Episode of respiratory distress,  Precedex changed to propofol Atrial fibrillation RVR >>amiodarone bolus >> sinus rhythm 3/21 rising creat , AF-RVR >> amio bolus 3/24:rising creatinine 3/30: reintubated 3/31 trach 4/1 PICC 4/22 on ATC 24 hours  4/24 trach downsized to 4 cuffless 4/27 capping trail started  Consults:  02/04/2019 ID  IR  Cardiology  Procedures:  ETT 3/10 > 3/18, 3/18 > 3/31 Trach 3/31 > CVL 3/10 > 4/3 PICC 4/1 >   Significant Diagnostic Tests:  Echo 3/15 decreased EF 35 to 40%, LVEDP 27, no significant MR Cardiac cath >> LVEDP 40, RCA totally occluded, patent OM1 stent but distally occluded, apical LAD diffusely diseased, no intervention Chest x-ray 3/22  bilateral lower lobe airspace disease versus layering effusions CXR 3/23> pulmonary edema and bilateral pleural effusions. Echo 3/26 limited> EF 35-40%, elevated LA and LV end-diastolic pressures, Diffuse hypokinesis of LV CXR 4/1> bibasilar opacities, small R pleural effusion, tracheostomy tube present and appropriately positioned  CXR 4/9> bilateral  infiltrates with R pleural effusion CXR 4/14> possible left sided infiltrate with a small effusion, right sided atelectasis CXR 4/7> L>R airspace disease, atelectasis vs pna; inc congestion  Micro Data:  02/04/2019 blood cultures x2>> Gr A strep 02/04/2019 sputum culture>> neg 02/04/2019 urine culture>>neg 02/04/2019 respiratory virus panel>> rhinovirus 02/04/2019 flu a and B>> neg coronavirus testing 02/04/2019>>neg resp 3/21 >> Klebsiella oxytoca, co-ag negative staph Resp 4/15 and 4/18>> neg  Antimicrobials:  02/04/2019 vancomycin>>3/ 10 02/04/2019 Zosyn>> 3/10 Pen G 3/10 >> 3/22 (plan) Ceftx 3/20 >>3/24 Cipro 3/24>> 3/31  Interim history/subjective:  No trach issues, tolerating size 4 cuffless trach well with PMV and on 28% FiO2.  Video interpreter utilized- Patient reports SOB when laying flat and due to that and her trach, she cant sleep.   Objective   Blood pressure (!) 100/47, pulse 81, temperature 97.8 F (36.6 C), temperature source Oral, resp. rate 19, height 5\' 6"  (1.676 m), weight 74.6 kg, SpO2 99 %.    FiO2 (%):  [28 %-40 %] 28 %   Intake/Output Summary (Last 24 hours) at 03/24/2019 1237 Last data filed at 03/23/2019 1800 Gross per 24 hour  Intake 2000 ml  Output -  Net 2000 ml   Filed Weights   03/22/19 0438 03/23/19 0441 03/24/19 0415  Weight: 73.8 kg 74.2 kg 74.6 kg   Physical Exam: General:  Chronically ill appearing female sitting up in bedside chair in NAD HEENT: MM pink/moist, midline 4 cuffless trach with PMV, with good phonation Neuro: Alert, oriented, MAE CV: RR, no murmur PULM: even/non-labored, lungs bilaterally clear anteriorly, diminished posterior bases, scattered crackles PO:EUMP, non-tender, bs active  Extremities: warm/dry, trace LE edema  Skin: no rashes   CXR 4/24 decreased vascular congestion , LLL opacity   Assessment &  Plan:   Chronic respiratory failure s/p trach  Placed on 3/31. Tolerating trach collar  Downsized to #4 cuffless  trach 4/24 P:  Start capping trial today, monitor closely, continuous pulse ox If she tolerates being capped for 48hrs, consider decannulation  Routine trach care PRN duonebs Aggressive pulmonary hygiene Diuresis per primary team  Hypoxemia P:  Titrate O2 for sat of 88-92% May need an ambulatory desaturation study prior to discharge  Dysphagia P: Dysphagia 2 diet  SLP following Downsized trach should additionally help  Atrial fibrillation Ischemic CM Anemia DM2 P:  Noted patient +6L (this is not accurate reflection given unmeasured output, weights overall stable, fluctuating 2-4 Lbs over last week) Per primary  GI ppx: Protonix DVT ppx: SCDs  Diet: honey thick dys 2 Code Status: full, PMT seen / consulted per primary 4/27   [name], MSN, AGACNP-BC [facility] Pgr: [phone] or if no answer [phone] 03/24/2019, 12:43 PM   PCCM Attending:  Patient seen today. Doing well. RT was in room for capping trial today. I spoke with the patient via the interpreter via telemedicine.   BP (!) 100/47 (BP Location: Right Arm)   Pulse 91   Temp 97.8 F (36.6 C) (Oral)   Resp (!) 21   Ht 5\' 6"  (1.676 m)   Wt 74.6 kg   SpO2 98%   BMI 26.55 kg/m   Gen: elderly fm, resting, sitting up in bed Neck: trach in place, with cap on, able to phonate Heart: RRR S1 s2 Lungs: CTAB, no crackles  Labs reviewed  A:  Chronic hypoxemic respiratory failure  Atrial Fibrillation  Ischemic CM   P: Continue capping trials If tolerates capping trials for a few days we can consider decannulation It appears anxiety will be an issue with her capping trials and this could be a slow process for her.   [name], DO [facility] 03/24/2019 3:41 PM

## 2019-03-24 NOTE — Progress Notes (Signed)
RT NOTE: RT attempted 3x to cap patient's trach. MD was in room as capping was attempted. Patient did not tolerate well the first two times and seemed to hold her breath. After much coaching and instruction through translator capping was attempted 3rd time. Patient tolerated well for about 2 minutes and then again got very anxious and upset and said she felt she that she could not breath. Patient is back on 28% ATC and is not capped. Vitals are stable. RT will continue to monitor.

## 2019-03-24 NOTE — Progress Notes (Signed)
RT NOTE: Patient does not wish to be suctioned at this time. Vitals are stable. RT will continue to monitor.

## 2019-03-24 NOTE — Progress Notes (Signed)
Physical Therapy Treatment Patient Details Name: Gabrielle Wright MRN: [id number] DOB: 1946/08/11 Today's Date: 03/24/2019    History of Present Illness 73 y.o. female admitted on 02/03/19 for fever and cough (to [facility]) and was transferred to [facility] on 02/04/19 for hypotension and respiratory distress (acute respiratory failure/pulmonary edema) requiring intubation 3/10-current (time of PT eval 02/27/2019).  She was visiting family from [place] and COVID 19 tests were negative.  She was found to have group A strep bacteremia (septic shiock), rhinovirus,  and course complicated by NSTEMI and new LV dysfunction s/p cardiac cath on 02/09/19.  Cardiology following.  Other dx include ischemic cardiomyopathy, hyperkalemia, DM2- uncontrolled, and abdominal distention.  Pt with significant PMH of DM, HTN, anc CAD. Trach placed on 3/31    PT Comments    Pt making excellent progress. Continue to recommend CIR.    Follow Up Recommendations  CIR;Supervision for mobility/OOB     Equipment Recommendations  Other (comment)    Recommendations for Other Services       Precautions / Restrictions Precautions Precautions: Fall Precaution Comments: trach, on 28%  Restrictions Weight Bearing Restrictions: No    Mobility  Bed Mobility               General bed mobility comments: Pt up on bsc upon entering room  Transfers Overall transfer level: Needs assistance Equipment used: Rolling walker (2 wheeled) Transfers: Sit to/from Stand Sit to Stand: Mod assist;+2 safety/equipment         General transfer comment: Assist to bring hips up and for balance. Tactile cues for hand placement.  Ambulation/Gait Ambulation/Gait assistance: Min assist;+2 safety/equipment Gait Distance (Feet): 10 Feet(10' x 1, 5' x 1) Assistive device: Rolling walker (2 wheeled) Gait Pattern/deviations: Step-through pattern;Decreased stride length;Trunk flexed Gait velocity: decr Gait velocity interpretation:  <1.31 ft/sec, indicative of household ambulator General Gait Details: Assist for balance and support. Verbal encouragement to incr distance. Pt amb on RA with SpO2 >94%.    Stairs             Wheelchair Mobility    Modified Rankin (Stroke Patients Only)       Balance Overall balance assessment: Needs assistance Sitting-balance support: Feet supported;No upper extremity supported Sitting balance-Leahy Scale: Fair     Standing balance support: Bilateral upper extremity supported;During functional activity Standing balance-Leahy Scale: Fair Standing balance comment: walker and min guard for static standing                            Cognition Arousal/Alertness: Awake/alert Behavior During Therapy: WFL for tasks assessed/performed Overall Cognitive Status: Difficult to assess                                        Exercises      General Comments        Pertinent Vitals/Pain Pain Assessment: No/denies pain Faces Pain Scale: No hurt    Home Living                      Prior Function            PT Goals (current goals can now be found in the care plan section) Progress towards PT goals: Progressing toward goals    Frequency    Min 3X/week      PT Plan Current plan remains  appropriate    Co-evaluation PT/OT/SLP Co-Evaluation/Treatment: Yes Reason for Co-Treatment: Complexity of the patient's impairments (multi-system involvement);For patient/therapist safety          AM-PAC PT "6 Clicks" Mobility   Outcome Measure  Help needed turning from your back to your side while in a flat bed without using bedrails?: A Little Help needed moving from lying on your back to sitting on the side of a flat bed without using bedrails?: A Little Help needed moving to and from a bed to a chair (including a wheelchair)?: A Lot Help needed standing up from a chair using your arms (e.g., wheelchair or bedside chair)?: A Lot Help  needed to walk in hospital room?: A Little Help needed climbing 3-5 steps with a railing? : Total 6 Click Score: 14    End of Session Equipment Utilized During Treatment: Oxygen;Gait belt Activity Tolerance: Patient tolerated treatment well Patient left: with call bell/phone within reach;in chair;with chair alarm set Nurse Communication: Mobility status PT Visit Diagnosis: Muscle weakness (generalized) (M62.81);Other abnormalities of gait and mobility (R26.89)     Time: 0867-6195 PT Time Calculation (min) (ACUTE ONLY): 23 min  Charges:  $Gait Training: 8-22 mins                     [facility] PT Acute Rehabilitation Services Pager [phone] Office [phone]    Angelina Ok [facility] 03/24/2019, 1:59 PM

## 2019-03-24 NOTE — Progress Notes (Signed)
Hypoglycemic Event  CBG: 59  Treatment:  Given snack  Symptoms: none  Follow-up CBG: Time:12:30 CBG Result:121  Possible Reasons for Even-none known  Comments/MD notified:    Azucena Kuba

## 2019-03-24 NOTE — Progress Notes (Signed)
Patient ID: Gabrielle Wright, female   DOB: Jun 27, 1946, 73 y.o.   MRN: [id number]  PROGRESS NOTE    Gabrielle Wright  FTD:[id number] DOB: 19-Sep-1946 DOA: 02/03/2019 PCP: Rudene Anda, MD   Brief Narrative:  73 year old female from [facility]ics with history of hypertension, CAD with STEMI/stenting in 2018, diabetes mellitus type 2 presented on 02/03/2019 with fever, cough, hypoxia and hypotension.  She was in respiratory distress and required intubation and vasopressors.  COVID-19 test was negative.  She has had a long hospitalization.  She was found to have group A strep bacteremia.  Course was complicated by non-STEMI and new LV dysfunction.  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  Also had intermittent episodes of A. fib with RVR.  She failed extubation trial and a tracheostomy tube placed on 02/24/2019.  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  PCCM following intermittently -PCCM downsized trach to cuffless 4 trach.  They will again reevaluate today to see if trach can be capped.  If capped for 2 nights, PCCM will consider decannulation on Wednesday -Currently on 5 L oxygen -Continue nebs and montelukast -Pulmonary toilet  Ischemic cardiomyopathy with acute on chronic systolic and  diastolic heart failure exacerbation -Echo showed EF of 35 to 40% with left diffuse hypokinesis -Underwent cardiac cath with no targets for revascularization -Cardiology recommended medical management   Cardiology has signed off.  Outpatient follow-up with cardiology. -Continue Lasix, Coreg, hydralazine, spironolactone, losartan, isosorbide dinitrate and amiodarone. -Strict input and output.  Daily weights.  CAD/non-STEMI -Continue aspirin.  Plavix was discontinued by cardiology because of anemia   Paroxysmal A. fib with RVR -Resolved.  Currently in sinus rhythm and rate controlled -Continue amiodarone and Coreg.  Not on anticoagulation due to anemia.  Outpatient follow-up with cardiology  Anemia of chronic disease -Hemoglobin stable.  Diabetes mellitus type 2 -Blood sugar stable.  Decrease Lantus to 10 units nightly.  Continue sliding scale insulin  Agitation/anxiety -Continue Zoloft and as needed Ativan  Dysphagia -Diet as per SLP recommendations.  Generalized deconditioning--might need CIR -Overall prognosis is guarded.  Palliative care evaluation is pending  Group A strep bacteremia -Completed antibiotic treatment -Blood cultures from 02/04/2019 are negative so far  Sepsis/septic shock: Present admission -Resolved.  Hemodynamically stable.  DVT prophylaxis: SCDs Code Status: Full Family Communication: None at bedside Disposition Plan: CIR once respiratory status stable.  Consultants:  PCCM/cardiology/palliative care IR/ID Procedures:  Echo on 02/09/2019   1. The left ventricle has moderately reduced systolic function, with an ejection fraction of 35-40%. The cavity size was normal. Left ventricular diastolic Doppler parameters are consistent with pseudonormalization. Elevated left ventricular  end-diastolic pressure The E/e' is 63. Regional wall motion abnormalities (see coded diagram below).  2. The right ventricle has mildly reduced systolic function. The cavity  was normal. There is no increase in right ventricular wall thickness.  3. No hemodynamically significant valvular heart disease.  4. Normal biatrial chamber size.  5. IVC not well visualized.  Cardiac catheterization on 02/09/2019  2nd Mrg-3 lesion is 80% stenosed.  Acute Mrg lesion is 90% stenosed.    Less than 25% left main  Proximal diffuse 30 to 40% narrowing within the previously placed LAD stent.  The entire apical LAD is diffusely diseased with up to 85% stenosis.  The first diagonal contains 85% ostial narrowing.  Circumflex contains diffuse proximal to mid eccentric 50% stenosis the dominant second obtuse marginal contains mid vessel 50% stenosis in the mid to distal portion of this obtuse marginal there is a patent stent with 20% narrowing, and just distal to the stent there is an 80% thrombotic stenosis.  The RCA is dominant containing 50% mid vessel stenosis, 70% distal stenosis, 70% ostial PDA stenosis, and total occlusion of the mid vessel that fills by collaterals from the left ventricular apex.  Left ventriculography is not performed.  Today's echo was reviewed and there is anteroapical severe hypokinesis and estimated ejection fraction of 40%.  LVEDP by Cath Lab hemodynamics is 40 mmHg.  RECOMMENDATIONS:   Acute on chronic combined systolic and diastolic heart failure with extremely elevated LVEDP greater than or equal to 40 mmHg related to global ischemia in the setting of norepinephrine therapy.  Recommend clopidogrel therapy if possible given stents in multiple territories and evidence of active thrombus beyond the stent in the obtuse marginal.  Current anatomy does not suggest that a role for coronary intervention. 35 cc of contrast used.  Given diabetes/shock state/intrinsic kidney disease, a bump in creatinine is still highly likely.  Kidney function needs to be monitored.  Limited echo on 02/20/2019 IMPRESSIONS    1. The left ventricle has moderately  reduced systolic function, with an ejection fraction of 35-40%. Left ventricular diastolic Doppler parameters are consistent with restrictive filling. Elevated left atrial and left ventricular end-diastolic pressures  The E/e' is 34. Left ventricular diffuse hypokinesis.  2. Severe akinesis of the left ventricular, entire apical segment.  3. Severe akinesis of the left ventricular, mid-apical anteroseptal wall and anterior wall.  4. Moderate pleural effusion in both left and right lateral regions.  5. There is mild mitral annular calcification present.  6. The aortic valve is grossly normal Aortic valve regurgitation is mild by color flow Doppler.  7. The inferior vena cava was normal in size with <50% respiratory variability.  8. Left atrial size was not assessed.  9. The ascending aorta and aortic root are normal in size and structure.  Intubation/extubation/reintubation  Tracheostomy on 02/24/2019  Subjective: Patient seen and examined at bedside. Patient feels uncomfortable around the trach.  No overnight fever or vomiting reported.  No worsening shortness of breath.  Objective: Vitals:   03/24/19 0415 03/24/19 0524 03/24/19 0803 03/24/19 0816  BP:  (!) 128/55 (!) 112/51 (!) 112/51  Pulse:   87 88  Resp:  15 (!) 22 14  Temp:   97.8 F (36.6 C)   TempSrc:   Oral   SpO2:  98% 100% 100%  Weight: 74.6 kg     Height:        Intake/Output Summary (Last 24 hours) at 03/24/2019 0913 Last data filed at 03/23/2019 1800 Gross per 24 hour  Intake 2000 ml  Output -  Net 2000 ml   Filed Weights   03/22/19 0438 03/23/19 0441 03/24/19 0415  Weight: 73.8 kg 74.2 kg 74.6 kg    Examination:  General exam: Appears calm and comfortable.  Looks chronically ill.  No distress. Respiratory system: Bilateral decreased breath sounds at bases with scattered crackles.  Trach in place.  No wheezing Cardiovascular system: S1 & S2 heard, Rate controlled Gastrointestinal system: Abdomen is  nondistended, soft and nontender. Normal bowel sounds heard. Extremities: No cyanosis; trace edema      Data Reviewed: I have personally reviewed following labs and imaging studies  CBC: Recent Labs  Lab 03/19/19 0535 03/20/19 0736 03/21/19 0447 03/22/19 0736 03/24/19 0629  WBC 8.4 11.0* 9.7 7.2 6.9  NEUTROABS  --   --   --   --  3.9  HGB 9.1* 9.0* 8.6* 8.1* 8.3*  HCT 31.4* 30.8* 29.3* 27.0* 27.3*  MCV 90.2 89.0 88.5 86.3 85.3  PLT 282 270 248 217 801   Basic Metabolic Panel: Recent Labs  Lab 03/18/19 0205 03/19/19 0535 03/20/19 0736 03/21/19 0447 03/22/19 0736 03/24/19 0629  NA 148* 147* 147* 142 137 138  K 4.2 4.7 4.3 4.2 3.6 3.6  CL 109 105 105 101 100 98  CO2 30 28 31 28 28 29   GLUCOSE 212* 246* 81 104* 95 148*  BUN 48* 54* 52* 39* 30* 24*  CREATININE 1.08* 1.29* 1.28* 1.19* 1.07* 1.31*  CALCIUM 8.9 8.9 9.1 9.0 8.3* 8.6*  MG 2.9*  --   --   --   --  2.1   GFR: Estimated Creatinine Clearance: 40.1 mL/min (A) (by C-G formula based on SCr of 1.31 mg/dL (H)). Liver Function Tests: No results for input(s): AST, ALT, ALKPHOS, BILITOT, PROT, ALBUMIN in the last 168 hours. No results for input(s): LIPASE, AMYLASE in the last 168 hours. No results for input(s): AMMONIA in the last 168 hours. Coagulation Profile: No results for input(s): INR, PROTIME in the last 168 hours. Cardiac Enzymes: No results for input(s): CKTOTAL, CKMB, CKMBINDEX, TROPONINI in the last 168 hours. BNP (last 3 results) No results for input(s): PROBNP in the last 8760 hours. HbA1C: No results for input(s): HGBA1C in the last 72 hours. CBG: Recent Labs  Lab 03/23/19 1942 03/23/19 2356 03/24/19 0034 03/24/19 0333 03/24/19 0801  GLUCAP 237* 59* 121* 191* 149*   Lipid Profile: No results for input(s): CHOL, HDL, LDLCALC, TRIG, CHOLHDL, LDLDIRECT in the last 72 hours. Thyroid Function Tests: No results for input(s): TSH, T4TOTAL, FREET4, T3FREE, THYROIDAB in the last 72 hours. Anemia  Panel: No results for input(s): VITAMINB12, FOLATE, FERRITIN, TIBC, IRON, RETICCTPCT in the last 72 hours. Sepsis Labs: No results for input(s): PROCALCITON, LATICACIDVEN in the last 168 hours.  Recent Results (from the past 240 hour(s))  Culture, respiratory (non-expectorated)     Status: None   Collection Time: 03/15/19  9:00 AM  Result Value Ref Range Status   Specimen Description TRACHEAL ASPIRATE  Final   Special Requests NONE  Final   Gram Stain   Final    RARE WBC PRESENT, PREDOMINANTLY PMN FEW GRAM POSITIVE RODS FEW GRAM POSITIVE COCCI IN PAIRS RARE GRAM NEGATIVE RODS    Culture   Final    FEW Consistent with normal respiratory flora. Performed at [facility], [place] [address], [place],  [address]    Report Status 03/17/2019 FINAL  Final         Radiology Studies: No results found.      Scheduled Meds: . albuterol  2.5 mg Nebulization TID  . amiodarone  200 mg Oral Daily  . aspirin  81 mg Oral Daily  . atorvastatin  40 mg Oral q1800  . carvedilol  3.125 mg Oral BID WC  . chlorhexidine gluconate (MEDLINE KIT)  15 mL Mouth Rinse BID  . furosemide  40 mg Oral Daily  . hydrALAZINE  10 mg Oral Q8H  . insulin aspart  0-24 Units Subcutaneous Q4H  . insulin glargine  15 Units Subcutaneous QHS  . isosorbide dinitrate  10 mg Oral TID  . losartan  25 mg Oral Daily  . mouth rinse  15 mL Mouth Rinse BID  . montelukast  10 mg Oral QHS  . multivitamin with minerals  1 tablet Oral Daily  . pantoprazole sodium  40 mg Oral Daily  . polyethylene glycol  17 g Oral BID  . sertraline  50 mg Oral Daily  . spironolactone  12.5 mg Oral Daily  . traZODone  50 mg Oral QHS   Continuous Infusions: . sodium chloride Stopped (03/04/19 0815)     LOS: 48 days        Aline August, MD [facility] 03/24/2019, 9:13 AM

## 2019-03-24 NOTE — Progress Notes (Signed)
Inpatient Diabetes Program Recommendations  AACE/ADA: New Consensus Statement on Inpatient Glycemic Control (2015)  Target Ranges:  Prepandial:   less than 140 mg/dL      Peak postprandial:   less than 180 mg/dL (1-2 hours)      Critically ill patients:  140 - 180 mg/dL   Lab Results  Component Value Date   GLUCAP 166 (H) 03/24/2019   HGBA1C 10.1 (H) 02/04/2019     Results for AKERAH, SENTER (MRN [id number]) as of 03/24/2019 16:54  Ref. Range 03/23/2019 16:25 03/23/2019 03/23/2019 19:42 03/23/2019 2234 03/23/2019 23:56 03/24/2019 00:34 03/24/2019 03:33 03/24/2019 08:01 03/24/2019 11:37  Glucose-Capillary Latest Ref Range: 70 - 99 mg/dL 354 (H) @1840  Novolog 8 units  @1952  Novolog 8 units    237 (H)     Lantus 15 units  59 (L) 121 (H) 191 (H)  Novolog 4 units 149 (H)  Novolog 2 units 166 (H)  Novolog 5 units          Home DM Meds:Humulin 70/30 Insulin: 28-30 units BID Regular Insulin 10 units with Lunch Tradjenta 2.5 mg Daily Metformin 500 mg Daily  Current Orders:Novolog 0-24 units Q4 hours, Lantus 15 units QHS    Note CBG 59mg /dl at 5625. This is likely due to the Novolog Q4 hour doses being given too close together at 1840 and 1952 for a total of Novolog 16 units.   The (0-24) correction scale Q4 is likely more coverage than needed at this time. Recommend changing to Glycemic Control Order set with the Novolog moderate correction. Spoke to RN and patient is taking in very little po so would recommend to leave on Q4 now and change to ac/hs when eating regularly.   MD please consider the following inpatient insulin adjustments:    Change correction to Novolog moderate (0-15 units) Q4 hours under the Glycemic Control Order set.    Thanks.   -- Will follow during hospitalization.--  Jamelle Rushing RN, MSN Diabetes Coordinator Inpatient Glycemic Control Team Team Pager:  [phone] (8am-5pm)

## 2019-03-24 NOTE — Consult Note (Signed)
Consultation Note Date: 03/25/2019   Patient Name: Gabrielle Wright  DOB: Feb 25, 1946  MRN: [id number]  Age / Sex: 73 y.o., female  PCP: Rudene Anda, MD Referring Physician: Elmarie Shiley, MD  Reason for Consultation: Establishing goals of care  HPI/Patient Profile: 73 y.o. female  with past medical history of htn, cad, DM admitted on 02/03/2019 with respiratory distress requiring pressors and intubation.  She has had a prolonged hospital course including trach placement on 3/30.  Her overall condition has stabilized enough for consideration for transition to CIR in near future, but patient had expressed she was not interested.  Palliative consulted for [place].  Clinical Assessment and Goals of Care:  I called and discussed with patient's daughter.  We reviewed her clinical course over hospitalization.  She reports that her mother is frustrated by being in the hospital and language barrier and her main goal is to get well enough to return home.  We discussed possible transition to CIR, and her daughter reports that this is her hope and her mother is agreeable to transition.  Initially, her mother misunderstood and thought that plan was to send her to SNF, which she refused.  She reports that her mother is agreeable to CIR after understanding that it is intensive rehab with goal of being home in a few weeks.  I then met with patient utilizing [employer] interpreter.  We were joined for portion of encounter by Shann Medal ([facility] admission coordinator).  Ms. Mohiuddin reports that she has been feeling better but still is sleeping poorly because of discomfort at trach site.  We discussed her goals which I to get strong enough return home with her daughter and family.  She expressed concern about CIR, not because she is not willing to go, but because she is worried if she gets tired.  Urban Gibson was able to explain rehab process and  Ms.  Brandis reports that she would like to pursue CIR when medically ready.  SUMMARY OF RECOMMENDATIONS   - Patient would like to pursue rehab at [facility] if she is a candidate.  Family in agreement with this plan.  Code Status/Advance Care Planning: Full code Psycho-social/Spiritual:   Desire for further Chaplaincy support:no  Additional Recommendations: Caregiving  Support/Resources  Prognosis:   Unable to determine  Discharge Planning: CIR later this week?     Primary Diagnoses: Present on Admission:  Sepsis ([place])  Hypotension   I have reviewed the medical record, interviewed the patient and family, and examined the patient. The following aspects are pertinent.  Past Medical History:  Diagnosis Date   Coronary artery disease    Diabetes mellitus without complication ([place])    Hypertension    Social History   Socioeconomic History   Marital status: Widowed    Spouse name: Not on file   Number of children: Not on file   Years of education: Not on file   Highest education level: Not on file  Occupational History   Not on file  Social Needs   Financial resource  strain: Not on file   Food insecurity:    Worry: Not on file    Inability: Not on file   Transportation needs:    Medical: Not on file    Non-medical: Not on file  Tobacco Use   Smoking status: Never Smoker   Smokeless tobacco: Never Used  Substance and Sexual Activity   Alcohol use: Never    Frequency: Never   Drug use: Never   Sexual activity: Not on file  Lifestyle   Physical activity:    Days per week: Not on file    Minutes per session: Not on file   Stress: Not on file  Relationships   Social connections:    Talks on phone: Not on file    Gets together: Not on file    Attends religious service: Not on file    Active member of club or organization: Not on file    Attends meetings of clubs or organizations: Not on file    Relationship status: Not on file  Other Topics  Concern   Not on file  Social History Narrative   Not on file   History reviewed. No pertinent family history. Scheduled Meds:  amiodarone  200 mg Oral Daily   aspirin  81 mg Oral Daily   atorvastatin  40 mg Oral q1800   carvedilol  3.125 mg Oral BID WC   chlorhexidine gluconate (MEDLINE KIT)  15 mL Mouth Rinse BID   furosemide  40 mg Oral Daily   hydrALAZINE  10 mg Oral Q8H   insulin aspart  0-24 Units Subcutaneous Q4H   insulin glargine  15 Units Subcutaneous QHS   isosorbide dinitrate  10 mg Oral TID   losartan  25 mg Oral Daily   mouth rinse  15 mL Mouth Rinse BID   montelukast  10 mg Oral QHS   multivitamin with minerals  1 tablet Oral Daily   pantoprazole sodium  40 mg Oral Daily   polyethylene glycol  17 g Oral BID   sertraline  50 mg Oral Daily   spironolactone  12.5 mg Oral Daily   traZODone  50 mg Oral QHS   Continuous Infusions:  sodium chloride Stopped (03/04/19 0815)   PRN Meds:.acetaminophen, bisacodyl, coconut oil, dextrose, diphenhydrAMINE, guaiFENesin, ipratropium-albuterol, lactulose, lidocaine (PF), LORazepam, morphine, ondansetron (ZOFRAN) IV, Resource ThickenUp Clear, senna, simethicone Medications Prior to Admission:  Prior to Admission medications   Medication Sig Start Date End Date Taking? Authorizing Provider  albuterol (VENTOLIN HFA) 108 (90 Base) MCG/ACT inhaler Inhale 2 puffs into the lungs every 6 (six) hours as needed for wheezing or shortness of breath.   Yes [provider]  atorvastatin (LIPITOR) 80 MG tablet Take 80 mg by mouth at bedtime.   Yes [provider]  bisoprolol (ZEBETA) 5 MG tablet Take 5 mg by mouth daily.   Yes [provider]  Calcium Carbonate-Vitamin D3 (CALCIUM 600+D3) 600-400 MG-UNIT TABS Take 1 tablet by mouth daily.   Yes [provider]  clopidogrel (PLAVIX) 75 MG tablet Take 75 mg by mouth daily.   Yes [provider]  febuxostat (ULORIC) 40 MG tablet  Take 40 mg by mouth daily.   Yes [provider]  ferrous sulfate 325 (65 FE) MG tablet Take 325 mg by mouth daily with breakfast.   Yes [provider]  furosemide (LASIX) 40 MG tablet Take 40 mg by mouth daily.   Yes [provider]  Insulin Isophane & Regular Human (HUMULIN 70/30  KWIKPEN) (70-30) 100 UNIT/ML PEN Inject 28-30 Units into the skin 2 (two) times daily.   Yes [provider]  insulin regular (HUMULIN R) 100 units/mL injection Inject 10 Units into the skin daily with lunch.   Yes [provider]  linaGLIPtin-metFORMIN HCl 2.5-500 MG TABS Take 1 tablet by mouth daily.   Yes [provider]  montelukast (SINGULAIR) 10 MG tablet Take 10 mg by mouth at bedtime.   Yes [provider]  NON FORMULARY Take 1 tablet by mouth See admin instructions. NEVRALIP 600 RETARD- (Nevralip 600 retard is a Food Supplement containing Lipoic Acid, Chromium Picolinate, Vitamins and Minerals. Selenium, Zinc and Vitamin E contribute to the protection of cells from oxidative stress): Take 1 tablet by mouth once a day or as otherwise directed   Yes [provider]  pantoprazole (PROTONIX) 40 MG tablet Take 40 mg by mouth daily before breakfast.   Yes [provider]  pregabalin (LYRICA) 75 MG capsule Take 75 mg by mouth 2 (two) times daily.   Yes [provider]  PRESCRIPTION MEDICATION Take 16 mg by mouth See admin instructions. BETAHISTINE DIHYDROCHLORIDE 16 MG TABLETS: Take 16 mg by mouth daily or as otherwise directed   Yes [provider]  spironolactone (ALDACTONE) 50 MG tablet Take 50 mg by mouth daily.   Yes [provider]  telmisartan (MICARDIS) 80 MG tablet Take 80 mg by mouth daily.   Yes [provider]   No Known Allergies Review of Systems  HENT:       Irritation and pain at trach site  Respiratory: Positive for cough and shortness of breath.   Psychiatric/Behavioral: Positive for  sleep disturbance.   Physical Exam  General: Alert, awake, in no acute distress.  Chronically ill appearing  HEENT: Trach in place Heart: Regular rate and rhythm. No murmur appreciated. Lungs: Good air movement, scattered crackles Abdomen: Soft, nontender, nondistended, positive bowel sounds.  Ext: No significant edema Skin: Warm and dry Neuro: Grossly intact, nonfocal.  Vital Signs: BP (!) 105/54 (BP Location: Right Arm)    Pulse 81    Temp 98.1 F (36.7 C) (Oral)    Resp (!) 21    Ht _0  (1.676 m)    Wt 74.8 kg    SpO2 100%    BMI 26.62 kg/m  Pain Scale: 0-10 POSS *See Group Information*: 1-Acceptable,Awake and alert Pain Score: 0-No pain   SpO2: SpO2: 100 % O2 Device:SpO2: 100 % O2 Flow Rate: .O2 Flow Rate (L/min): 5 L/min  IO: Intake/output summary:   Intake/Output Summary (Last 24 hours) at 03/25/2019 8333 Last data filed at 03/24/2019 2203 Gross per 24 hour  Intake 400 ml  Output 550 ml  Net -150 ml    LBM: Last BM Date: 03/21/19 Baseline Weight: Weight: 80.7 kg Most recent weight: Weight: 74.8 kg     Palliative Assessment/Data:   Flowsheet Rows     Most Recent Value  Intake Tab  Referral Department  Hospitalist  Unit at Time of Referral  Med/Surg Unit  Palliative Care Primary Diagnosis  Pulmonary  Date Notified  03/22/19  Palliative Care Type  New Palliative care  Reason for referral  Clarify Goals of Care  Date of Admission  02/04/19  Date first seen by Palliative Care  03/24/19  # of days Palliative referral response time  2 Day(s)  # of days IP prior to Palliative referral  46  Clinical Assessment  Palliative Performance Scale Score  60%  Psychosocial & Spiritual Assessment  Palliative Care Outcomes  Patient/Family meeting held?  Yes  Who was at the meeting?  Patient, daughter via phone, interpreter  Palliative Care Outcomes  Clarified goals of care      Time In: 1215 Time Out: 1345 Time Total: 90 Greater than 50%  of this time was spent  counseling and coordinating care related to the above assessment and plan.  Signed by: Micheline Rough, MD   Please contact Palliative Medicine Team phone at [phone] for questions and concerns.  For individual provider: See Shea Evans

## 2019-03-24 NOTE — Progress Notes (Signed)
Occupational Therapy Treatment Patient Details Name: Gabrielle Wright MRN: [id number] DOB: September 01, 1946 Today's Date: 03/24/2019    History of present illness 73 y.o. female admitted on 02/03/19 for fever and cough (to [facility]) and was transferred to [facility] on 02/04/19 for hypotension and respiratory distress (acute respiratory failure/pulmonary edema) requiring intubation 3/10-current (time of PT eval 02/27/2019).  She was visiting family from [place] and COVID 19 tests were negative.  She was found to have group A strep bacteremia (septic shiock), rhinovirus,  and course complicated by NSTEMI and new LV dysfunction s/p cardiac cath on 02/09/19.  Cardiology following.  Other dx include ischemic cardiomyopathy, hyperkalemia, DM2- uncontrolled, and abdominal distention.  Pt with significant PMH of DM, HTN, anc CAD. Trach placed on 3/31   OT comments  Pt progressing well. Self feeding with supervision. Performed hand washing in standing, but sits abruptly when fatigued. Brushed hair in sitting with min assist due to knots. Pt ambulating short distances with RW and min assist, second person for safety.Updated d/c to CIR.  Follow Up Recommendations  CIR;Supervision/Assistance - 24 hour    Equipment Recommendations       Recommendations for Other Services      Precautions / Restrictions Precautions Precautions: Fall Precaution Comments: trach, on 28%  Restrictions Weight Bearing Restrictions: No       Mobility Bed Mobility               General bed mobility comments: Pt up on bsc upon entering room  Transfers Overall transfer level: Needs assistance Equipment used: Rolling walker (2 wheeled) Transfers: Sit to/from Stand Sit to Stand: Mod assist;+2 safety/equipment         General transfer comment: Assist to bring hips up and for balance. Tactile cues for hand placement. Performed x 3.    Balance Overall balance assessment: Needs assistance Sitting-balance support: Feet  supported;No upper extremity supported Sitting balance-Leahy Scale: Fair     Standing balance support: Bilateral upper extremity supported;During functional activity Standing balance-Leahy Scale: Poor Standing balance comment: bracing LEs on chair with standing at sink                           ADL either performed or assessed with clinical judgement   ADL Overall ADL's : Needs assistance/impaired Eating/Feeding: Sitting;Supervision/ safety Eating/Feeding Details (indicate cue type and reason): pt does not fully appreciate her swallowing precautions Grooming: Wash/dry hands;Brushing hair;Sitting;Standing;Minimal assistance Grooming Details (indicate cue type and reason): stood at sink for washing hands, sat abruptly to dry, fatigues easily                 Toilet Transfer: Moderate assistance;RW;BSC   Toileting- Clothing Manipulation and Hygiene: Total assistance;Sit to/from stand       Functional mobility during ADLs: Minimal assistance;+2 for safety/equipment;Rolling walker General ADL Comments: fatigues very quickly     Vision       Perception     Praxis      Cognition Arousal/Alertness: Awake/alert Behavior During Therapy: WFL for tasks assessed/performed Overall Cognitive Status: Difficult to assess                                          Exercises     Shoulder Instructions       General Comments      Pertinent Vitals/ Pain  Pain Assessment: No/denies pain Faces Pain Scale: No hurt  Home Living                                          Prior Functioning/Environment              Frequency  Min 3X/week        Progress Toward Goals  OT Goals(current goals can now be found in the care plan section)  Progress towards OT goals: Progressing toward goals  Acute Rehab OT Goals Patient Stated Goal: not stated today OT Goal Formulation: With family Time For Goal Achievement:  04/03/19 Potential to Achieve Goals: Good  Plan Discharge plan remains appropriate;Frequency remains appropriate;Discharge plan needs to be updated    Co-evaluation    PT/OT/SLP Co-Evaluation/Treatment: Yes Reason for Co-Treatment: Complexity of the patient's impairments (multi-system involvement)   OT goals addressed during session: ADL's and self-care      AM-PAC OT "6 Clicks" Daily Activity     Outcome Measure   Help from another person eating meals?: A Little Help from another person taking care of personal grooming?: A Little Help from another person toileting, which includes using toliet, bedpan, or urinal?: A Lot Help from another person bathing (including washing, rinsing, drying)?: A Lot Help from another person to put on and taking off regular upper body clothing?: A Lot Help from another person to put on and taking off regular lower body clothing?: A Lot 6 Click Score: 14    End of Session Equipment Utilized During Treatment: Rolling walker;Gait belt  OT Visit Diagnosis: Muscle weakness (generalized) (M62.81);Other symptoms and signs involving cognitive function   Activity Tolerance Patient tolerated treatment well   Patient Left in chair;with call bell/phone within reach;with chair alarm set   Nurse Communication Other (comment)(needs thickener)        Time: 0712-1975 OT Time Calculation (min): 23 min  Charges: OT General Charges $OT Visit: 1 Visit OT Treatments $Self Care/Home Management : 8-22 mins  Martie Round, OTR/L Acute Rehabilitation Services Pager: [phone] Office: [phone]   Evern Bio 03/24/2019, 2:30 PM

## 2019-03-24 NOTE — Progress Notes (Addendum)
Inpatient Rehab Admissions Coordinator:   I met with pt at bedside, Palliative MD present as well.  Delane Ginger for interpreter services.  Pt agreeable to come to CIR when medically ready.  Plan for capping trials and hopeful for decannulation this week.  Will continue to follow for medical readiness and hopeful admission later this week pending bed availability.   Shann Medal, PT, DPT Admissions Coordinator [phone] 03/24/19  1:24 PM

## 2019-03-25 LAB — GLUCOSE, CAPILLARY
Glucose-Capillary: 146 mg/dL — ABNORMAL HIGH (ref 70–99)
Glucose-Capillary: 123 mg/dL — ABNORMAL HIGH (ref 70–99)
Glucose-Capillary: 241 mg/dL — ABNORMAL HIGH (ref 70–99)
Glucose-Capillary: 195 mg/dL — ABNORMAL HIGH (ref 70–99)
Glucose-Capillary: 108 mg/dL — ABNORMAL HIGH (ref 70–99)

## 2019-03-25 NOTE — Progress Notes (Signed)
Physical Therapy Treatment Patient Details Name: Gabrielle Wright MRN: [id number] DOB: Feb 26, 1946 Today's Date: 03/25/2019    History of Present Illness 73 y.o. female admitted on 02/03/19 for fever and cough (to [facility]) and was transferred to [facility] on 02/04/19 for hypotension and respiratory distress (acute respiratory failure/pulmonary edema) requiring intubation 3/10-current (time of PT eval 02/27/2019).  She was visiting family from [place] and COVID 19 tests were negative.  She was found to have group A strep bacteremia (septic shiock), rhinovirus,  and course complicated by NSTEMI and new LV dysfunction s/p cardiac cath on 02/09/19.  Cardiology following.  Other dx include ischemic cardiomyopathy, hyperkalemia, DM2- uncontrolled, and abdominal distention.  Pt with significant PMH of DM, HTN, anc CAD. Trach placed on 3/31    PT Comments    Pt making steady progress with mobility. Video interpreter used Gabrielle Wright [phone]. Pt required maximum verbal encouragement to participate.    Follow Up Recommendations  CIR;Supervision for mobility/OOB     Equipment Recommendations  Other (comment)    Recommendations for Other Services       Precautions / Restrictions Precautions Precautions: Fall Precaution Comments: trach, on 28%  Restrictions Weight Bearing Restrictions: No    Mobility  Bed Mobility               General bed mobility comments: Pt up in chair.  Transfers Overall transfer level: Needs assistance Equipment used: Rolling walker (2 wheeled) Transfers: Sit to/from Stand Sit to Stand: Mod assist;+2 safety/equipment         General transfer comment: Assist to bring hips up and for balance. Tactile cues for hand placement.  Ambulation/Gait Ambulation/Gait assistance: Min assist;+2 safety/equipment Gait Distance (Feet): 35 Feet Assistive device: Rolling walker (2 wheeled) Gait Pattern/deviations: Step-through pattern;Decreased stride length;Trunk flexed Gait  velocity: decr Gait velocity interpretation: <1.31 ft/sec, indicative of household ambulator General Gait Details: Assist for balance and support. Verbal cues to stand more erect.  Pt amb on RA with SpO2 >96%.   Stairs             Wheelchair Mobility    Modified Rankin (Stroke Patients Only)       Balance Overall balance assessment: Needs assistance Sitting-balance support: Feet supported;No upper extremity supported Sitting balance-Leahy Scale: Fair     Standing balance support: Bilateral upper extremity supported;During functional activity Standing balance-Leahy Scale: Fair Standing balance comment: walker and min guard for static standing                            Cognition Arousal/Alertness: Awake/alert Behavior During Therapy: WFL for tasks assessed/performed Overall Cognitive Status: Difficult to assess                                 General Comments: Follows commands and answers questions through interpreter.      Exercises      General Comments        Pertinent Vitals/Pain Pain Assessment: Faces Faces Pain Scale: Hurts little more Pain Location: trach  Pain Descriptors / Indicators: Sore Pain Intervention(s): Limited activity within patient's tolerance    Home Living     Available Help at Discharge: Family;Available 24 hours/day Type of Home: House Home Access: Level entry   Home Layout: Two level;Able to live on main level with bedroom/bathroom        Prior Function  PT Goals (current goals can now be found in the care plan section) Progress towards PT goals: Progressing toward goals    Frequency    Min 3X/week      PT Plan Current plan remains appropriate    Co-evaluation              AM-PAC PT "6 Clicks" Mobility   Outcome Measure  Help needed turning from your back to your side while in a flat bed without using bedrails?: A Little Help needed moving from lying on your back to  sitting on the side of a flat bed without using bedrails?: A Little Help needed moving to and from a bed to a chair (including a wheelchair)?: A Lot Help needed standing up from a chair using your arms (e.g., wheelchair or bedside chair)?: A Lot Help needed to walk in hospital room?: A Little Help needed climbing 3-5 steps with a railing? : Total 6 Click Score: 14    End of Session   Activity Tolerance: Patient tolerated treatment well Patient left: with call bell/phone within reach;in chair;with chair alarm set Nurse Communication: Mobility status PT Visit Diagnosis: Muscle weakness (generalized) (M62.81);Other abnormalities of gait and mobility (R26.89)     Time: 3606-7703 PT Time Calculation (min) (ACUTE ONLY): 26 min  Charges:  $Gait Training: 23-37 mins                     [facility] PT Acute Rehabilitation Services Pager [phone] Office [phone]    Angelina Ok [facility] 03/25/2019, 12:01 PM

## 2019-03-25 NOTE — Progress Notes (Addendum)
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  She was in respiratory distress and required intubation and vasopressors.  COVID-19 test was negative.  She has had a long hospitalization.  She was found to have group A strep bacteremia.  Course was complicated by non-STEMI and new LV dysfunction.  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  Also had intermittent episodes of A. fib with RVR.  She failed extubation trial and a tracheostomy tube placed on 02/24/2019.  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  - post trach on 3/30. -Tolerating trach collar. 28 % 5 L.  -Secondary to ischemic  cardiomyopathy and pulmonary edema -CCM following. -Chest x ray with atelectasis/ left Lower lobe.  - Nebulizer.  -Tolerating  cuff to 4 (placed 4/24).  Capping of tracheostomy Trial.  Per CCM defer decannulation beyond customary 24 hours due to prolong course but plan to decannulation prior to discharge.  Patient agree to go to CIR>  Follow chest x ray in am.   Ischemic cardiomyopathy acute on chronic systolic and diastolic heart  failure exacerbation: NSTEMI Echocardiogram with ejection fraction 35 to 40%, left diffuse hypokinesis. Underwent cardiac cath with no targets for revascularization. Cardiology recommending ACE, spironolactone and Lasix. Creatinine up today, will hold IV Lasix today and spironolactone and resume tomorrow as needed. Continue with cozaar. Monitor renal function.  Resume oral lasix and spironolactone. Monitor renal function.   A. fib with RVR resolved: Not on anticoagulation per cardiology drop in hemoglobin.  Hypernatremia: resolved.   Diabetes type 2 with complication: Continue with Lantus, sliding scale insulin.  Agitation anxiety On clonazepam PRN, increase to 1 mg  Continue with Zoloft  Sepsis/septic shock: Present admission -Resolved.  Hemodynamically stable.  Dysphagia Pass swallow evaluation.  On dysphagia diet 2.      Nutrition Problem: Increased nutrient needs Etiology: acute illness    Signs/Symptoms: estimated needs    Interventions: Magic cup  Estimated body mass index is 26.62 kg/m as calculated from the following:   Height as of this encounter: 5' 6"  (1.676 m).   Weight as of this encounter: 74.8 kg.   DVT prophylaxis: SCDs Code Status: Full code Family Communication Disposition Plan: CIR when ok by CCM  Consultants:   CCM  Cardiology   Procedures:   Cath  Trach   Antimicrobials:      Subjective: She is worry about capping.  She is breathing ok.  Use translator to communicate with patient.    Objective: Vitals:   03/24/19 2323 03/25/19 0309 03/25/19 0327 03/25/19 0430  BP: (!) 117/57  (!) 112/50   Pulse: 78 90 81   Resp: 13 16 15    Temp:  TempSrc:      SpO2: 98%  100%   Weight:    74.8 kg  Height:        Intake/Output Summary (Last 24 hours) at 03/25/2019 7948 Last data filed at 03/24/2019 2203 Gross per 24 hour  Intake 400 ml  Output 550 ml  Net -150 ml   Filed Weights   03/23/19 0441 03/24/19 0415  03/25/19 0430  Weight: 74.2 kg 74.6 kg 74.8 kg    Examination:  General exam: NAD Respiratory system: Trach in place, CTA Cardiovascular system: S 1, S 2 RRR Gastrointestinal system: BS present, soft, nt, nd Central nervous system: Alert, follow command Extremities: Symmetric power.  Skin: No rashes.     Data Reviewed: I have personally reviewed following labs and imaging studies  CBC: Recent Labs  Lab 03/19/19 0535 03/20/19 0736 03/21/19 0447 03/22/19 0736 03/24/19 0629  WBC 8.4 11.0* 9.7 7.2 6.9  NEUTROABS  --   --   --   --  3.9  HGB 9.1* 9.0* 8.6* 8.1* 8.3*  HCT 31.4* 30.8* 29.3* 27.0* 27.3*  MCV 90.2 89.0 88.5 86.3 85.3  PLT 282 270 248 217 016   Basic Metabolic Panel: Recent Labs  Lab 03/19/19 0535 03/20/19 0736 03/21/19 0447 03/22/19 0736 03/24/19 0629  NA 147* 147* 142 137 138  K 4.7 4.3 4.2 3.6 3.6  CL 105 105 101 100 98  CO2 28 31 28 28 29   GLUCOSE 246* 81 104* 95 148*  BUN 54* 52* 39* 30* 24*  CREATININE 1.29* 1.28* 1.19* 1.07* 1.31*  CALCIUM 8.9 9.1 9.0 8.3* 8.6*  MG  --   --   --   --  2.1   GFR: Estimated Creatinine Clearance: 40.1 mL/min (A) (by C-G formula based on SCr of 1.31 mg/dL (H)). Liver Function Tests: No results for input(s): AST, ALT, ALKPHOS, BILITOT, PROT, ALBUMIN in the last 168 hours. No results for input(s): LIPASE, AMYLASE in the last 168 hours. No results for input(s): AMMONIA in the last 168 hours. Coagulation Profile: No results for input(s): INR, PROTIME in the last 168 hours. Cardiac Enzymes: No results for input(s): CKTOTAL, CKMB, CKMBINDEX, TROPONINI in the last 168 hours. BNP (last 3 results) No results for input(s): PROBNP in the last 8760 hours. HbA1C: No results for input(s): HGBA1C in the last 72 hours. CBG: Recent Labs  Lab 03/24/19 1137 03/24/19 1706 03/24/19 1946 03/24/19 2322 03/25/19 0329  GLUCAP 166* 223* 185* 134* 146*   Lipid Profile: No results for input(s): CHOL, HDL, LDLCALC, TRIG,  CHOLHDL, LDLDIRECT in the last 72 hours. Thyroid Function Tests: No results for input(s): TSH, T4TOTAL, FREET4, T3FREE, THYROIDAB in the last 72 hours. Anemia Panel: No results for input(s): VITAMINB12, FOLATE, FERRITIN, TIBC, IRON, RETICCTPCT in the last 72 hours. Sepsis Labs: No results for input(s): PROCALCITON, LATICACIDVEN in the last 168 hours.  Recent Results (from the past 240 hour(s))  Culture, respiratory (non-expectorated)     Status: None   Collection Time: 03/15/19  9:00 AM  Result Value Ref Range Status   Specimen Description TRACHEAL ASPIRATE  Final   Special Requests NONE  Final   Gram Stain   Final    RARE WBC PRESENT, PREDOMINANTLY PMN FEW GRAM POSITIVE RODS FEW GRAM POSITIVE COCCI IN PAIRS RARE GRAM NEGATIVE RODS    Culture   Final    FEW Consistent with normal respiratory flora. Performed at [facility], [place] [address] [place], [place] [address]    Report Status  03/17/2019 FINAL  Final         Radiology Studies: No results found.      Scheduled Meds: . amiodarone  200 mg Oral Daily  . aspirin  81 mg Oral Daily  . atorvastatin  40 mg Oral q1800  . carvedilol  3.125 mg Oral BID WC  . chlorhexidine gluconate (MEDLINE KIT)  15 mL Mouth Rinse BID  . furosemide  40 mg Oral Daily  . hydrALAZINE  10 mg Oral Q8H  . insulin aspart  0-24 Units Subcutaneous Q4H  . insulin glargine  15 Units Subcutaneous QHS  . isosorbide dinitrate  10 mg Oral TID  . losartan  25 mg Oral Daily  . mouth rinse  15 mL Mouth Rinse BID  . montelukast  10 mg Oral QHS  . multivitamin with minerals  1 tablet Oral Daily  . pantoprazole sodium  40 mg Oral Daily  . polyethylene glycol  17 g Oral BID  . sertraline  50 mg Oral Daily  . spironolactone  12.5 mg Oral Daily  . traZODone  50 mg Oral QHS   Continuous Infusions: . sodium chloride Stopped (03/04/19 0815)     LOS: 49 days    Time spent: 35 minutes.     Elmarie Shiley, MD [facility] Pager  [phone] If 7PM-7AM, please contact night-coverage [email or web address] Password [facility] 03/25/2019, 7:42 AM

## 2019-03-25 NOTE — Progress Notes (Signed)
Palliative care brief note  Met with patient and discussed with daughter.  - FULL CODE - Patient desires to transition to CIR when medically ready.  Full consult to follow.  Micheline Rough, MD [place] [facility] [phone]

## 2019-03-25 NOTE — PMR Pre-admission (Addendum)
PMR Admission Coordinator Pre-Admission Assessment  Patient: Gabrielle Wright is an 73 y.o., female MRN: [id number] DOB: Nov 08, 1946 Height: _0  (167.6 cm) Weight: 74.8 kg              Insurance Information HMO:     PPO:      PCP:      IPA:      80/20:      OTHER:  PRIMARY: Medicaid [place] Access      Policy#: [id number]      Subscriber: patient Verified via Passport [employer] and over phone on 03/24/2019 Coverage Code [id number]  Medicaid Application Date:       Case Manager:  Disability Application Date:       Case Worker:   The "Data Collection Information Summary" for patients in Inpatient Rehabilitation Facilities with attached "Privacy Act [place] Records" was provided and verbally reviewed with: N/A  Emergency Contact Information Contact Information    Name Relation Home Work Mobile   [place] Daughter   [phone]   Dina Rich   [phone]     Current Medical History  Patient Admitting Diagnosis: Debility after exacerbation of CHF as well as respiratory failure  History of Present Illness: Gabrielle Wright is a 73 y.o.right handed non-English-speaking female with history of CAD with STEMI/stenting 2018 maintained on Plavix, diabetes mellitus,CKD stage III, hypertension.  Admitted 02/03/2019 with fever as well as cough to [facility] and was transferred to [facility] 02/04/2019 for hypertension, respiratory distress requiring intubation. Patient was placed on IV pressors. Covid 19 test negative. She was found to have group A strep bacteremia with septic shock. Hospital course complicated by non-STEMI, identified per EKG. 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 Underwent cardiac catheterization 02/09/2019 per Dr. Daneen Schick. Showing 20-30% diffuse Nehring within the proximal LAD stent and diffuse narrowing in the distal LAD apical segment. Patient's stent in the mid to distal obtuse marginal  with 80-90% stenosis distal to the stent with active thrombus noted and currently maintained on aspirin. Bouts of atrial fibrillation with RVR remained on aspirin therapy. Due to ongoing respiratory failure with hypoxemia tracheostomy tube was placed 02/24/2019 per Dr. Nelda Marseille. She has been downsized to a size 4 cuffless trach and tolerating on 28% fiO2 5L at rest, but able to complete limited mobilization on room air and maintain good O2 sats.  Capping trials are ongoing, pt is limited by anxiety with capping.  Per PCCM, pt okay to continue capping trials on CIR.  Goal for tolerating trach capped at 72 hours prior to decannulation. Patient is currently on a dysphagia #2 honey thick liquid diet and speech therapy follow-up evaluating swallow as well as possible need for gastrostomy tube for nutritional support. Acute on chronic anemia 9.0 and monitored.    Glasgow Coma Scale Score: (!) 20  Past Medical History  Past Medical History:  Diagnosis Date  . Coronary artery disease   . Diabetes mellitus without complication ([place])   . Hypertension     Family History  family history is not on file.  Prior Rehab/Hospitalizations:  Has the patient had prior rehab or hospitalizations prior to admission? No  Has the patient had major surgery during 100 days prior to admission? Yes, Cardiac cath on 02/09/2019 and trach placed on 02/24/2019  Current Medications   Current Facility-Administered Medications:  .  0.9 %  sodium chloride infusion, 250 mL, Intravenous, Continuous, Belva Crome, MD, Stopped at 03/04/19 0815 .  acetaminophen (TYLENOL)  tablet 650 mg, 650 mg, Oral, Q6H PRN, Skeet Simmer, RPH, 650 mg at 03/27/19 0827 .  amiodarone (PACERONE) tablet 200 mg, 200 mg, Oral, Daily, Skeet Simmer, RPH, 200 mg at 03/26/19 3570 .  aspirin chewable tablet 81 mg, 81 mg, Oral, Daily, Skeet Simmer, RPH, 81 mg at 03/26/19 1779 .  atorvastatin (LIPITOR) tablet 40 mg, 40 mg, Oral, q1800, Skeet Simmer,  RPH, 40 mg at 03/26/19 1818 .  bisacodyl (DULCOLAX) suppository 10 mg, 10 mg, Rectal, Daily PRN, Bowser, Laurel Dimmer, NP .  carvedilol (COREG) tablet 3.125 mg, 3.125 mg, Oral, BID WC, Skeet Simmer, RPH, 3.125 mg at 03/25/19 1648 .  chlorhexidine gluconate (MEDLINE KIT) (PERIDEX) 0.12 % solution 15 mL, 15 mL, Mouth Rinse, BID, Simpson, Paula B, NP, 15 mL at 03/26/19 2101 .  coconut oil, 1 application, Topical, PRN, Dana Allan I, MD, 1 application at 39/03/00 2216 .  dextrose 50 % solution 25 mL, 25 mL, Intravenous, PRN, Belva Crome, MD, 25 mL at 03/05/19 0753 .  diphenhydrAMINE (BENADRYL) capsule 25 mg, 25 mg, Oral, Q6H PRN, Ogan, Okoronkwo U, MD, 25 mg at 03/26/19 2100 .  furosemide (LASIX) tablet 40 mg, 40 mg, Oral, Daily, Regalado, Belkys A, MD, 40 mg at 03/26/19 0918 .  guaiFENesin (ROBITUSSIN) 100 MG/5ML solution 100 mg, 5 mL, Oral, Q4H PRN, Jari Favre, Gorica, MD, 100 mg at 03/27/19 0827 .  hydrALAZINE (APRESOLINE) tablet 10 mg, 10 mg, Oral, BID, Emokpae, Courage, MD, 10 mg at 03/26/19 2100 .  CBG monitoring, , , Q4H **AND** insulin aspart (novoLOG) injection 0-24 Units, 0-24 Units, Subcutaneous, Q4H, Belva Crome, MD, 2 Units at 03/27/19 0827 .  insulin glargine (LANTUS) injection 15 Units, 15 Units, Subcutaneous, QHS, Alekh, Kshitiz, MD, 15 Units at 03/26/19 2100 .  ipratropium-albuterol (DUONEB) 0.5-2.5 (3) MG/3ML nebulizer solution 3 mL, 3 mL, Nebulization, Q4H PRN, Rigoberto Noel, MD, 3 mL at 03/24/19 0100 .  isosorbide mononitrate (IMDUR) 24 hr tablet 30 mg, 30 mg, Oral, Daily, Emokpae, Courage, MD .  lactulose (CHRONULAC) 10 GM/15ML solution 10 g, 10 g, Oral, Daily PRN, Skeet Simmer, RPH, 10 g at 03/26/19 1207 .  lidocaine (PF) (XYLOCAINE) 1 % injection, , , PRN, Sandi Mariscal, MD, 5 mL at 02/26/19 1304 .  LORazepam (ATIVAN) tablet 0.5 mg, 0.5 mg, Oral, Q6H PRN, Starla Link, Kshitiz, MD, 0.5 mg at 03/26/19 2100 .  losartan (COZAAR) tablet 25 mg, 25 mg, Oral, Daily, Skeet Simmer,  RPH, 25 mg at 03/26/19 9233 .  MEDLINE mouth rinse, 15 mL, Mouth Rinse, BID, Regalado, Belkys A, MD, 15 mL at 03/26/19 2102 .  montelukast (SINGULAIR) tablet 10 mg, 10 mg, Oral, QHS, Dana Allan I, MD, 10 mg at 03/26/19 2100 .  morphine 10 MG/5ML solution 2.5 mg, 2.5 mg, Oral, Q6H PRN, Dana Allan I, MD, 2.5 mg at 03/20/19 1119 .  multivitamin with minerals tablet 1 tablet, 1 tablet, Oral, Daily, Skeet Simmer, [facility], 1 tablet at 03/26/19 0915 .  ondansetron (ZOFRAN) injection 4 mg, 4 mg, Intravenous, Q6H PRN, Belva Crome, MD, 4 mg at 03/23/19 1139 .  pantoprazole sodium (PROTONIX) 40 mg/20 mL oral suspension 40 mg, 40 mg, Oral, Daily, Skeet Simmer, RPH, 40 mg at 03/26/19 1058 .  polyethylene glycol (MIRALAX / GLYCOLAX) packet 17 g, 17 g, Oral, BID, Svalina, Gorica, MD, 17 g at 03/26/19 2100 .  Resource ThickenUp Clear, , Oral, PRN, Mannam, Praveen, MD .  senna (SENOKOT) tablet 17.2  mg, 2 tablet, Oral, BID PRN, Skeet Simmer, [facility] .  sertraline (ZOLOFT) tablet 50 mg, 50 mg, Oral, Daily, Skeet Simmer, RPH, 50 mg at 03/26/19 7654 .  simethicone (MYLICON) chewable tablet 40 mg, 40 mg, Oral, Q6H PRN, Skeet Simmer, RPH, 40 mg at 03/26/19 1207 .  spironolactone (ALDACTONE) tablet 12.5 mg, 12.5 mg, Oral, Daily, Regalado, Belkys A, MD, 12.5 mg at 03/26/19 0917 .  traZODone (DESYREL) tablet 50 mg, 50 mg, Oral, QHS, Skeet Simmer, RPH, 50 mg at 03/26/19 2100  Patients Current Diet:  Diet Order            DIET DYS 2 Room service appropriate? Yes; Fluid consistency: Honey Thick  Diet effective now              Precautions / Restrictions Precautions Precautions: Fall Precaution Comments: trach, on 28%  Restrictions Weight Bearing Restrictions: No   Has the patient had 2 or more falls or a fall with injury in the past year?No  Prior Activity Level Limited Community (1-2x/wk): spends 1/2 the year in [place], 1/2 the year in the [place]; does not drive  Prior Functional  Level Prior Function Level of Independence: Independent Comments: pt visits the [place] 7 months out of the year, was at her daughter's x 10 days when she became ill  Self Care: Did the patient need help bathing, dressing, using the toilet or eating?  Independent  Indoor Mobility: Did the patient need assistance with walking from room to room (with or without device)? Independent  Stairs: Did the patient need assistance with internal or external stairs (with or without device)? Independent  Functional Cognition: Did the patient need help planning regular tasks such as shopping or remembering to take medications? Independent  Home Assistive Devices / Equipment Home Assistive Devices/Equipment: None  Prior Device Use: Indicate devices/aids used by the patient prior to current illness, exacerbation or injury? None of the above  Current Functional Level Cognition  Overall Cognitive Status: Difficult to assess Difficult to assess due to: Tracheostomy, Non-English speaking Orientation Level: Oriented X4 General Comments: Follows commands and answers questions through interpreter.    Extremity Assessment (includes Sensation/Coordination)  Upper Extremity Assessment: Generalized weakness RUE Deficits / Details: 3/5 RUE Coordination: decreased fine motor, decreased gross motor LUE Deficits / Details: 3/5 LUE Coordination: decreased fine motor, decreased gross motor  Lower Extremity Assessment: Generalized weakness    ADLs  Overall ADL's : Needs assistance/impaired Eating/Feeding: Sitting, Supervision/ safety Eating/Feeding Details (indicate cue type and reason): pt does not fully appreciate her swallowing precautions Grooming: Set up, Sitting Grooming Details (indicate cue type and reason): stood at sink for washing hands, sat abruptly to dry, fatigues easily Upper Body Bathing: Set up, Bed level Upper Body Bathing Details (indicate cue type and reason): for back EOB Lower Body Bathing:  Moderate assistance, Sitting/lateral leans, Bed level Toilet Transfer: Moderate assistance, RW, BSC Toilet Transfer Details (indicate cue type and reason): recliner to EOB Toileting- Clothing Manipulation and Hygiene: Total assistance, Sit to/from stand Toileting - Clothing Manipulation Details (indicate cue type and reason): 3rd person helpful for rear perianal care Functional mobility during ADLs: Minimal assistance, Rolling walker General ADL Comments: fatigues very quickly    Mobility  Overal bed mobility: Needs Assistance Bed Mobility: Sit to Supine Rolling: Min assist Sidelying to sit: Mod assist Supine to sit: Mod assist, HOB elevated Sit to supine: Min assist General bed mobility comments: sitting on BSC for RN    Transfers  Overall  transfer level: Needs assistance Equipment used: Rolling walker (2 wheeled) Transfers: Sit to/from Stand Sit to Stand: Min assist Stand pivot transfers: Mod assist, +2 physical assistance, +2 safety/equipment  Lateral/Scoot Transfers: Total assist General transfer comment: Assist for balance. Tactile cues for hand placement    Ambulation / Gait / Stairs / Wheelchair Mobility  Ambulation/Gait Ambulation/Gait assistance: Min Web designer (Feet): 40 Feet(x 2) Assistive device: Rolling walker (2 wheeled) Gait Pattern/deviations: Step-through pattern, Decreased stride length, Trunk flexed General Gait Details: Assist for balance and support. Pt amb on RA with SpO2 >92%. Pt with 1 sitting rest break of 2 minutes Gait velocity: decr Gait velocity interpretation: <1.31 ft/sec, indicative of household ambulator    Posture / Balance Dynamic Sitting Balance Sitting balance - Comments: brief sitting EOB with min guard to min assist staticall y Balance Overall balance assessment: Needs assistance Sitting-balance support: Feet supported, No upper extremity supported Sitting balance-Leahy Scale: Fair Sitting balance - Comments: brief sitting EOB  with min guard to min assist staticall y Postural control: Posterior lean Standing balance support: Bilateral upper extremity supported, During functional activity Standing balance-Leahy Scale: Fair Standing balance comment: walker and min guard for static standing    Special needs/care consideration BiPAP/CPAP no CPM no Continuous Drip IV no Dialysis no        Days n/a Life Vest no Oxygen 28% fiO2 5L via trach collar Special Bed no Trach Size 4 cuffless Wound Vac (area) no      Location n/a Skin ecchymosis to BUEs   Bowel mgmt: continent, last BM 03/24/2019 Bladder mgmt: continent, purewick  Diabetic mgmt: yes, insulin and oral Behavioral consideration no Chemo/radiation no     Previous Home Environment (from acute therapy documentation) Living Arrangements: Children, Other relatives  Lives With: Daughter Available Help at Discharge: Family, Available 24 hours/day Type of Home: House Home Layout: Two level, Able to live on main level with bedroom/bathroom Alternate Level Stairs-Rails: Right Alternate Level Stairs-Number of Steps: full flight Home Access: Level entry Bathroom Shower/Tub: Chiropodist: Standard Bathroom Accessibility: Yes How Accessible: Accessible via walker Home Care Services: No Additional Comments: unsure, pt is unable to report and family is not currently present.   Discharge Living Setting Plans for Discharge Living Setting: Lives with (comment)(daughter) Type of Home at Discharge: House Discharge Home Layout: Two level, Able to live on main level with bedroom/bathroom Alternate Level Stairs-Rails: Right Alternate Level Stairs-Number of Steps: full flight Discharge Home Access: Level entry Discharge Bathroom Shower/Tub: Tub/shower unit Discharge Bathroom Toilet: Standard Discharge Bathroom Accessibility: Yes How Accessible: Accessible via walker Does the patient have any problems obtaining your medications?:  No  Social/Family/Support Systems Anticipated Caregiver: daughter, Glorious Peach Anticipated Caregiver's Contact Information: [phone] Caregiver Availability: 24/7(verified that daughter can provide 24/7 at d/c) Discharge Plan Discussed with Primary Caregiver: Yes Is Caregiver In Agreement with Plan?: Yes Does Caregiver/Family have Issues with Lodging/Transportation while Pt is in Rehab?: No   Goals/Additional Needs Patient/Family Goal for Rehab: PT/OT/SLP supervision Expected length of stay: 14-18 days Cultural Considerations: speaks Bengali Dietary Needs: dys 2/honey thick Equipment Needs: tbd Special Service Needs: Bengali interpretter; please note, per [employer], no local vendors available for Bengali interpreting.  Please use [employer] (ipad) or contact the language line at [phone].   Pt/Family Agrees to Admission and willing to participate: Yes Program Orientation Provided & Reviewed with Pt/Caregiver Including Roles  & Responsibilities: Yes  Possible need for SNF placement upon discharge: no   Patient Condition: This patient's medical  and functional status has changed since the consult dated: 03/20/2019  in which the Rehabilitation Physician determined and documented that the patient's condition is appropriate for intensive rehabilitative care in an inpatient rehabilitation facility. See "History of Present Illness" (above) for medical update. Functional changes are: pt is min assist +2 for gait, min +1 for transfers and ADLs. Patient's medical and functional status update has been discussed with the Rehabilitation physician and patient remains appropriate for inpatient rehabilitation. Will admit to inpatient rehab today.  Preadmission Screen Completed By:  Michel Santee, PT, DPT 03/27/2019 9:54 AM ______________________________________________________________________   Discussed status with Dr. Letta Pate on 03/27/19 at 9:54 AM  and received approval for admission  today.  Admission Coordinator:  Michel Santee, PT, DPT time 9:54 AM Sudie Grumbling 03/27/19

## 2019-03-25 NOTE — Progress Notes (Signed)
  Speech Language Pathology Treatment: Gabrielle Wright Speaking valve  Patient Details Name: Gabrielle Wright MRN: [id number] DOB: [birth year] Today's Date: 03/25/2019 Time: 2518-9842 SLP Time Calculation (min) (ACUTE ONLY): 35 min  Assessment / Plan / Recommendation Clinical Impression  Patient seen to address speech goals with PMV in place, but dysphagia goals were not addressed secondary to patient declining PO's. She continues to be on trach collar, trach has been recently downsized to #4, cuffless and she has been tolerating the PMV during waking hours without difficulty. SpO2 % remained at 100 throughout session, respiratory rate did not increase and HR did not significantly change. Patient did exhibit noticeable fatigue with conversation with SLP and phone interpreter, and required more frequent pausing to take a breath in. Majority of session was spent talking to and reassuring patient as the attempted trach capping that RT performed yesterday created a lot of anxiety with patient and she is asking if there is "another test" that can be done instead of that one. Patient also just seems anxious in general, saying she doesn't like to be left alone too long, gets anxious if she is expecting a nurse or staff member to come back but they do not. Patient achieved good voicing with PMV and able to speak loudly and clearly enough that phone interpreter was able to hear and understand her approximately 80% of the time on the first attempt. Patient had requested water but then declined as she was getting tired. She does not seem to have a good appetite for the meals.    HPI HPI: 73 y.o. female admitted on 02/03/19 for fever and cough (to [facility]) and was transferred to [facility] on 02/04/19 for hypotension and respiratory distress (acute respiratory failure/pulmonary edema) requiring intubation 3/10.  She was visiting family from [place] and COVID 19 tests were negative.  She was found to have group A strep  bacteremia (septic shiock), rhinovirus,  and course complicated by NSTEMI and new LV dysfunction s/p cardiac cath on 02/09/19.  Other dx include ischemic cardiomyopathy, hyperkalemia, DM2- uncontrolled, and abdominal distention.  Pt with significant PMH of DM, HTN, anc CAD. Trach placed on 3/31      SLP Plan  Continue with current plan of care       Recommendations         Patient may use Passy-Muir Speech Valve: Intermittently with supervision;During all therapies with supervision;Caregiver trained to provide supervision;During PO intake/meals PMSV Supervision: Intermittent         General recommendations: Rehab consult Oral Care Recommendations: Oral care BID Follow up Recommendations: Inpatient Rehab SLP Visit Diagnosis: Dysarthria and anarthria (R47.1) Plan: Continue with current plan of care       GO                Pablo Lawrence 03/25/2019, 4:04 PM    Angela Nevin, MA, CCC-SLP Speech Therapy [facility] Acute Rehab Pager: [phone]

## 2019-03-25 NOTE — Progress Notes (Addendum)
NAME:  Gabrielle Wright, MRN:  [id number], DOB:  [birth year], LOS: 49 ADMISSION DATE:  02/03/2019, CONSULTATION DATE: 02/04/2019 REFERRING MD: Emergency department physician CHIEF COMPLAINT: Fever respiratory failure  Brief History   73 year old elderly woman visiting from [place], admitted 3/10 with fevers, hypotension, found to have group A strep bacteremia, required mechanical ventilation Course complicated by non-STEMI and new LV dysfunction ? Ischemic vs Takatsubo's Failed extubation 3/18 due to acute pulmonary edema. S/p trach 3/30.  Past Medical History  Hypertension Coronary artery disease Suspected diabetes.  Significant Hospital Is Events   02/04/2019 transfer from [facility] to [facility] required intubation and pressors. 3/16 off levophed 3/18 extubated briefly but reintubated due to pulmonary edema 3/19 started milrinone 3/20 Episode of respiratory distress,  Precedex changed to propofol Atrial fibrillation RVR >>amiodarone bolus >> sinus rhythm 3/21 rising creat , AF-RVR >> amio bolus 3/24:rising creatinine 3/30: reintubated 3/31 trach 4/1 PICC 4/22 on ATC 24 hours  4/24 trach downsized to 4 cuffless 4/27 capping trail started  Consults:  02/04/2019 ID  IR  Cardiology  Procedures:  ETT 3/10 > 3/18, 3/18 > 3/31 Trach 3/31 > CVL 3/10 > 4/3 PICC 4/1 >   Significant Diagnostic Tests:  Echo 3/15 decreased EF 35 to 40%, LVEDP 27, no significant MR Cardiac cath >> LVEDP 40, RCA totally occluded, patent OM1 stent but distally occluded, apical LAD diffusely diseased, no intervention Chest x-ray 3/22  bilateral lower lobe airspace disease versus layering effusions CXR 3/23> pulmonary edema and bilateral pleural effusions. Echo 3/26 limited> EF 35-40%, elevated LA and LV end-diastolic pressures, Diffuse hypokinesis of LV CXR 4/1> bibasilar opacities, small R pleural effusion, tracheostomy tube present and appropriately positioned  CXR 4/9> bilateral  infiltrates with R pleural effusion CXR 4/14> possible left sided infiltrate with a small effusion, right sided atelectasis CXR 4/7> L>R airspace disease, atelectasis vs pna; inc congestion  Micro Data:  02/04/2019 blood cultures x2>> Gr A strep 02/04/2019 sputum culture>> neg 02/04/2019 urine culture>>neg 02/04/2019 respiratory virus panel>> rhinovirus 02/04/2019 flu a and B>> neg coronavirus testing 02/04/2019>>neg resp 3/21 >> Klebsiella oxytoca, co-ag negative staph Resp 4/15 and 4/18>> neg  Antimicrobials:  02/04/2019 vancomycin>>3/ 10 02/04/2019 Zosyn>> 3/10 Pen G 3/10 >> 3/22 (plan) Ceftx 3/20 >>3/24 Cipro 3/24>> 3/31  Interim history/subjective:  No trach issues, tolerating size 4 cuffless trach well with PMV and on 28% FiO2.  She has tolerated capping x 24 hours without difficulty( capped 4/27)  She has been in line suctioned x 1 last 24 hours If she can tolerate another 24 hours can consider de-cannulation   Objective   Blood pressure (!) 105/54, pulse 81, temperature 98.1 F (36.7 C), temperature source Oral, resp. rate (!) 21, height 5\' 6"  (1.676 m), weight 74.8 kg, SpO2 100 %.    FiO2 (%):  [28 %] 28 %   Intake/Output Summary (Last 24 hours) at 03/25/2019 0857 Last data filed at 03/24/2019 2203 Gross per 24 hour  Intake 400 ml  Output 550 ml  Net -150 ml   Filed Weights   03/23/19 0441 03/24/19 0415 03/25/19 0430  Weight: 74.2 kg 74.6 kg 74.8 kg   Physical Exam: General:  Chronically ill appearing female sitting up in bedside chair in NAD, trach capped HEENT: MM pink/moist, midline 4 cuffless trach with PMV secure and intact, + good phonation. No LAD, No JVD Neuro: Alert, oriented, MAE x 4, follows commands CV: S1, S2, RRR, no murmur, rub, gallop PULM: Bilateral chest excursion, even/non-labored, lungs  bilaterally clear anteriorly, diminished posterior bases, scattered crackles TI:RWER, non-tender, bs active, obese  Extremities: warm/dry, trace LE edema  Skin:  no rashes , lesions  CXR 4/24 decreased vascular congestion , LLL opacity   Assessment & Plan:   Chronic respiratory failure s/p trach  Placed on 3/31. Tolerating trach collar  Downsized to #4 cuffless trach 4/24 Capped Trach 4/27 P:  Tolerating capping trial well,continue to  monitor closely, continuous pulse ox If she tolerates being capped for 48hrs, consider decannulation  Routine trach care PRN duonebs Aggressive pulmonary hygiene Diuresis per primary team OOB to chair  Hypoxemia P:  Titrate O2 for sat of 88-92% May need an ambulatory desaturation study prior to discharge  Dysphagia P: Dysphagia 2 diet  SLP following Downsized trach should additionally help  Atrial fibrillation Ischemic CM Anemia DM2 P:  Noted patient +6L (this is not accurate reflection given unmeasured output, weights overall stable, fluctuating 2-4 Lbs over last week) Consider CXR and diuresis Per primary  GI ppx: Protonix DVT ppx: SCDs  Diet: honey thick dys 2 Code Status: full, PMT seen / consulted per primary 4/27   Bevelyn Ngo, AGACNP-BC [facility] Pgr: [phone] or if no answer [phone] 03/25/2019, 8:57 AM

## 2019-03-26 ENCOUNTER — Inpatient Hospital Stay (HOSPITAL_COMMUNITY): Payer: Medicaid Other

## 2019-03-26 DIAGNOSIS — Z515 Encounter for palliative care: Secondary | ICD-10-CM

## 2019-03-26 DIAGNOSIS — Z7189 Other specified counseling: Secondary | ICD-10-CM

## 2019-03-26 LAB — CBC
HCT: 29.4 % — ABNORMAL LOW (ref 36.0–46.0)
Hemoglobin: 8.7 g/dL — ABNORMAL LOW (ref 12.0–15.0)
MCH: 25.8 pg — ABNORMAL LOW (ref 26.0–34.0)
MCHC: 29.6 g/dL — ABNORMAL LOW (ref 30.0–36.0)
MCV: 87.2 fL (ref 80.0–100.0)
Platelets: 279 10*3/uL (ref 150–400)
RBC: 3.37 MIL/uL — ABNORMAL LOW (ref 3.87–5.11)
RDW: 16.9 % — ABNORMAL HIGH (ref 11.5–15.5)
WBC: 7.7 10*3/uL (ref 4.0–10.5)
nRBC: 0 % (ref 0.0–0.2)

## 2019-03-26 LAB — BASIC METABOLIC PANEL
Anion gap: 14 (ref 5–15)
BUN: 19 mg/dL (ref 8–23)
CO2: 29 mmol/L (ref 22–32)
Calcium: 9.1 mg/dL (ref 8.9–10.3)
Chloride: 97 mmol/L — ABNORMAL LOW (ref 98–111)
Creatinine, Ser: 1.18 mg/dL — ABNORMAL HIGH (ref 0.44–1.00)
GFR calc Af Amer: 53 mL/min — ABNORMAL LOW (ref >60)
GFR calc non Af Amer: 46 mL/min — ABNORMAL LOW (ref >60)
Glucose, Bld: 113 mg/dL — ABNORMAL HIGH (ref 70–99)
Potassium: 3.8 mmol/L (ref 3.5–5.1)
Sodium: 140 mmol/L (ref 135–145)

## 2019-03-26 LAB — GLUCOSE, CAPILLARY
Glucose-Capillary: 128 mg/dL — ABNORMAL HIGH (ref 70–99)
Glucose-Capillary: 165 mg/dL — ABNORMAL HIGH (ref 70–99)
Glucose-Capillary: 174 mg/dL — ABNORMAL HIGH (ref 70–99)
Glucose-Capillary: 187 mg/dL — ABNORMAL HIGH (ref 70–99)
Glucose-Capillary: 201 mg/dL — ABNORMAL HIGH (ref 70–99)
Glucose-Capillary: 247 mg/dL — ABNORMAL HIGH (ref 70–99)
Glucose-Capillary: 83 mg/dL (ref 70–99)

## 2019-03-26 MED ORDER — HYDRALAZINE HCL 10 MG PO TABS
10.0000 mg | ORAL_TABLET | Freq: Two times a day (BID) | ORAL | Status: DC
  Administered 2019-03-26: 10 mg via ORAL
  Filled 2019-03-26: qty 1

## 2019-03-26 MED ORDER — ISOSORBIDE MONONITRATE ER 30 MG PO TB24: 30.0000 mg | ORAL_TABLET | Freq: Every day | ORAL | Status: DC

## 2019-03-26 NOTE — TOC Progression Note (Signed)
Transition of Care [facility]) - Progression Note    Patient Details  Name: Iwalani Soda MRN: [id number] Date of Birth: 06-Mar-1946  Transition of Care [facility]) CM/SW Contact  Leone Haven, RN Phone Number: 03/26/2019, 2:33 PM  Clinical Narrative:    Continues with capping trial for goal of decannulation prior to transiiton to CIR.   Expected Discharge Plan: IP Rehab Facility Barriers to Discharge: Continued Medical Work up  Expected Discharge Plan and Services Expected Discharge Plan: IP Rehab Facility In-house Referral: Clinical Social Work Discharge Planning Services: CM Consult Post Acute Care Choice: IP Rehab Living arrangements for the past 2 months: Single Family Home                 DME Arranged: N/A DME Agency: NA       HH Arranged: NA HH Agency: NA         Social Determinants of Health (SDOH) Interventions    Readmission Risk Interventions Readmission Risk Prevention Plan 03/25/2019  Transportation Screening Complete  Medication Review [employer]) Complete  HRI or Home Care Consult Not Complete  HRI or Home Care Consult Pt Refusal Comments going to CIR   SW Recovery Care/Counseling Consult Not Complete  SW Consult Not Complete Comments NA  Palliative Care Screening Not Applicable  Skilled Nursing Facility Complete

## 2019-03-26 NOTE — Progress Notes (Signed)
PMT brief progress note  Patient seen in the 2W hallway, walking with assistance from PT and a walker.  Does not appear to be in distress, has trach, no edema noted, no resp distress noted.  Chart reviewed BP 106/61 (BP Location: Left Arm)   Pulse 90   Temp 97.9 F (36.6 C) (Oral)   Resp 15   Ht 5\' 6"  (1.676 m)   Wt 74.8 kg   SpO2 100%   BMI 26.62 kg/m  Labs and imaging noted    73 year old elderly woman visiting from [place], admitted 3/10 with fevers, hypotension, found to have group A strep bacteremia, required mechanical ventilation Course complicated by non-STEMI and new LV dysfunction ? Ischemic vs Takatsubo's Failed extubation 3/18 due to acute pulmonary edema. S/p trach 3/30.  PMT consult for goals of care discussions.  Patient and family have elected full code, full scope Patient being considered for CIR soon PCCM also following.  No additional PMT specific recommendations at this time Continue current mode of care 15 minutes spent.  Rosalin Hawking MD [facility] palliative medicine team [phone]

## 2019-03-26 NOTE — Progress Notes (Addendum)
NAME:  Blanche Kimmins, MRN:  [id number], DOB:  [birth year], LOS: 50 ADMISSION DATE:  02/03/2019, CONSULTATION DATE: 02/04/2019 REFERRING MD: Emergency department physician CHIEF COMPLAINT: Fever respiratory failure  Brief History   73 year old elderly woman visiting from [place], admitted 3/10 with fevers, hypotension, found to have group A strep bacteremia, required mechanical ventilation Course complicated by non-STEMI and new LV dysfunction ? Ischemic vs Takatsubo's Failed extubation 3/18 due to acute pulmonary edema. S/p trach 3/30.  Past Medical History  Hypertension Coronary artery disease Suspected diabetes.  Significant Hospital Is Events   02/04/2019 transfer from [facility] to [facility] required intubation and pressors. 3/16 off levophed 3/18 extubated briefly but reintubated due to pulmonary edema 3/19 started milrinone 3/20 Episode of respiratory distress,  Precedex changed to propofol Atrial fibrillation RVR >>amiodarone bolus >> sinus rhythm 3/21 rising creat , AF-RVR >> amio bolus 3/24:rising creatinine 3/30: reintubated 3/31 trach 4/1 PICC 4/22 on ATC 24 hours  4/24 trach downsized to 4 cuffless 4/27 capping trail started  Consults:  02/04/2019 ID  IR  Cardiology  Procedures:  ETT 3/10 > 3/18, 3/18 > 3/31 Trach 3/31 > CVL 3/10 > 4/3 PICC 4/1 >   Significant Diagnostic Tests:  Echo 3/15 decreased EF 35 to 40%, LVEDP 27, no significant MR Cardiac cath >> LVEDP 40, RCA totally occluded, patent OM1 stent but distally occluded, apical LAD diffusely diseased, no intervention Chest x-ray 3/22  bilateral lower lobe airspace disease versus layering effusions CXR 3/23> pulmonary edema and bilateral pleural effusions. Echo 3/26 limited> EF 35-40%, elevated LA and LV end-diastolic pressures, Diffuse hypokinesis of LV CXR 4/1> bibasilar opacities, small R pleural effusion, tracheostomy tube present and appropriately positioned  CXR 4/9> bilateral  infiltrates with R pleural effusion CXR 4/14> possible left sided infiltrate with a small effusion, right sided atelectasis CXR 4/7> L>R airspace disease, atelectasis vs pna; inc congestion  Micro Data:  02/04/2019 blood cultures x2>> Gr A strep 02/04/2019 sputum culture>> neg 02/04/2019 urine culture>>neg 02/04/2019 respiratory virus panel>> rhinovirus 02/04/2019 flu a and B>> neg coronavirus testing 02/04/2019>>neg resp 3/21 >> Klebsiella oxytoca, co-ag negative staph Resp 4/15 and 4/18>> neg  Antimicrobials:  02/04/2019 vancomycin>>3/ 10 02/04/2019 Zosyn>> 3/10 Pen G 3/10 >> 3/22 (plan) Ceftx 3/20 >>3/24 Cipro 3/24>> 3/31  Interim history/subjective:  No trach issues, tolerating size 4 cuffless trach well with PMV and on 28% FiO2. ( 5 L) Trach was capped 4/27>> patient has not tolerated for any significant period of time last 24 hours. She prefers the PM valve. She understands in order to be advanced to CIR and home she must be able to tolerate capping trach 24-48 hours .  She remains anxious and  requests frequent suctioning, despite minimal secretions. Tolerating diet. Continues to complain of loneliness. Will need ongoing discussions regarding capping to progress to rehab and home  Objective   Blood pressure (!) 116/54, pulse 77, temperature 98.1 F (36.7 C), resp. rate (!) 28, height 5\' 6"  (1.676 m), weight 74.8 kg, SpO2 96 %.    FiO2 (%):  [28 %] 28 %   Intake/Output Summary (Last 24 hours) at 03/26/2019 0846 Last data filed at 03/25/2019 0900 Gross per 24 hour  Intake 500 ml  Output -  Net 500 ml   Filed Weights   03/23/19 0441 03/24/19 0415 03/25/19 0430  Weight: 74.2 kg 74.6 kg 74.8 kg   Physical Exam: General:  Chronically ill appearing female sitting up in bedside chair in NAD, PM valve in place,  HEENT: MM pink/moist, midline 4 cuffless trach with PMV secure and intact, + good phonation.  No LAD, No JVD Neuro: Alert, oriented, MAE x 4, follows commands despite  language barrier CV: S1, S2, RRR, no murmur, rub, gallop PULM: Bilateral chest excursion, even/non-labored, lungs bilaterally clear anteriorly, few rhonchi which clear with cough, diminished posterior bases, few scattered crackles RC:VKFM, non-tender, bs active, obese  Extremities: warm/dry, trace LE edema  Skin: no rashes , lesions  CXR 4/29>>Interval improvement left lower lobe airspace disease. Small left effusion   Assessment & Plan:   Chronic respiratory failure s/p trach  Placed on 3/31. Tolerating trach collar  Downsized to #4 cuffless trach 4/24 Capped Trach 4/27>>  Tolerates cap for short intervals , then wants PM valve. Gets panicked , but sats remain 100 % and RR  Remains WNL P:  Continue  capping trials ,continue to  monitor closely, continuous pulse ox Will need continued conversations with interpreter to explain and reinforce  importance of committing to capping x 24-48 hours to allow for decannulation and transfer to CIR Continue Routine trach care PRN duonebs Aggressive pulmonary hygiene Diuresis per primary team OOB to chair/ Mobilize. PT/OT  Hypoxemia P:  Titrate O2 for sat of 88-94% May need an ambulatory desaturation study prior to discharge  Dysphagia P: Continue Dysphagia 2 diet  SLP following>> appreciate assistance   Atrial fibrillation Ischemic CM Anemia DM2 P:  CXR without vascular congestion 4/29 Plan CXR prn Diuresis per primary team  GI ppx: Protonix DVT ppx: SCDs  Diet: honey thick dys 2 Code Status: full, PMT seen / consulted per primary 4/27  PCCM will continue to follow with goal of decanulation prior to transition to CIR   Bevelyn Ngo, AGACNP-BC [facility] Pgr: [phone] or if no answer [phone] 03/26/2019, 8:46 AM

## 2019-03-26 NOTE — Progress Notes (Signed)
-                                                                                Patient Demographics:    Gabrielle Wright, is a 73 y.o. female, DOB - 1946-03-24, UXL:[id number]  Admit date - 02/03/2019   Admitting Physician Anders Simmonds, MD  Outpatient Primary MD for the patient is Rudene Anda, MD  LOS - 49   Chief Complaint  Patient presents with  . 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 (HCC)   Septic shock (HCC)   Acute on chronic respiratory failure with hypoxia (HCC)   AKI (acute kidney injury) (HCC)   Abdominal distension (gaseous)   HCAP (healthcare-associated pneumonia)   Acute pulmonary edema (HCC)   Tracheostomy status ([place])   Palliative care by specialist   Goals of care, counseling/discussion  Brief Summary:- 73 year old elderly woman visiting from [place], admitted 3/10 with fevers, hypotension, found to have group A strep bacteremia, required intubation and mechanical ventilation on 02/04/2019, extubated briefly on 02/12/2019, but Reintubated on same date due to pulmonary edema Course complicated by non-STEMI and new LV dysfunction ? Ischemic vs Takatsubo's, Failed extubation 3/18 due to acute pulmonary edema. S/p trach 3/30.  A/p 1)Acute hypoxic respiratory failure--post tracheostomy--- respiratory failure was secondary to ischemic cardiomyopathy and pulmonary edema, currently on room air. Tolerating  cuff to 4 (placed 4/24).  Capping of tracheostomy Trial.  Per CCM defer decannulation beyond customary 24 hours due to prolong course but plan to decannulation prior to discharge  2)HFrEF/  Ischemic cardiomyopathy acute on chronic systolic and diastolic heart failure exacerbation: NSTEMI ----EF 35 to 40% with diffuse hypokinesis of the left ventricle, LHC without target vessels for revascularization, and comanagement with ACE, Aldactone and Lasix advised.  Continue aspirin 81 mg daily, Lipitor 40 mg daily and Coreg 3.125 twice daily, continue Lasix 40 mg daily, Aldactone 12.5 mg daily, losartan 25 mg daily, start isosorbide 30 mg daily, hydralazine 10 mg twice daily  3)Chronic respiratory failure s/p trach ---- Placed on 3/31. Tolerating trach collar  Downsized to #4 cuffless trach 03/21/19  4)Transient atrial fibrillation with RVR--- resolved patient currently in sinus rhythm, cardiology recommended against anticoagulation due to drop in H&H .  Continue amiodarone and aspirin  5)Anxiety--- continue clonazepam as needed, continue Zoloft scheduled  6)DM2--stable, continue Lantus insulin 15 units nightly along with sliding scale coverage  7)Generalized Weakness/Debility--- continue physical and occupational therapy, awaiting transfer to CIR  Disposition/Need for in-Hospital Stay- patient unable to be discharged at this time due to awaiting bed availability for transfer to CIR  Code Status : Full  Family Communication:   na  Procedures ETT 3/10 > 3/18, 3/18 > 3/31 Trach 3/31 > CVL 3/10 > 4/3 PICC 4/1 >  Significant acute events 02/04/2019 transfer from [facility] to [facility] required intubation and pressors. 3/16 off levophed 3/18 extubated briefly but reintubated due to pulmonary edema 3/19 started milrinone 3/20 Episode of respiratory distress,  Precedex changed to propofol Atrial fibrillation RVR >>amiodarone bolus >> sinus rhythm 3/21 rising creat , AF-RVR >> amio bolus 3/24:rising creatinine 3/30: reintubated 3/31 trach 4/1 PICC 4/22 on ATC 24 hours  4/24 trach downsized to 4 cuffless 4/27 capping trail started    Disposition Plan  :  CIR  Consults  :  PCCM/Cardiology  DVT Prophylaxis  :    SCDs    Lab Results  Component Value Date   PLT 279 03/26/2019    Inpatient Medications  Scheduled Meds: . amiodarone  200 mg Oral Daily  . aspirin  81 mg Oral Daily  . atorvastatin  40 mg Oral q1800  . carvedilol  3.125 mg Oral BID WC  . chlorhexidine gluconate (MEDLINE KIT)  15 mL Mouth Rinse BID  . furosemide  40 mg Oral Daily  . hydrALAZINE  10 mg Oral Q8H  . insulin aspart  0-24 Units Subcutaneous Q4H  . insulin glargine  15 Units Subcutaneous QHS  . isosorbide dinitrate  10 mg Oral TID  . losartan  25 mg Oral Daily  . mouth rinse  15 mL Mouth Rinse BID  . montelukast  10 mg Oral QHS  . multivitamin with minerals  1 tablet Oral Daily  . pantoprazole sodium  40 mg Oral Daily  . polyethylene glycol  17 g Oral BID  . sertraline  50 mg Oral Daily  . spironolactone  12.5 mg Oral Daily  . traZODone  50 mg Oral QHS   Continuous Infusions: . sodium chloride Stopped (03/04/19 0815)   PRN Meds:.acetaminophen, bisacodyl, coconut oil, dextrose, diphenhydrAMINE, guaiFENesin, ipratropium-albuterol, lactulose, lidocaine (PF), LORazepam, morphine, ondansetron (ZOFRAN) IV, Resource ThickenUp Clear, senna, simethicone    Anti-infectives (From admission, onward)   Start     Dose/Rate Route Frequency Ordered Stop   02/19/19 1200  ciprofloxacin (CIPRO) IVPB 400 mg     400 mg 200 mL/hr over 60 Minutes Intravenous Every 12 hours 02/19/19 1119 02/25/19 1321   02/18/19 1230  ciprofloxacin (CIPRO) IVPB 400 mg  Status:  Discontinued     400 mg 200 mL/hr over 60 Minutes Intravenous Every 24 hours 02/18/19 1127 02/19/19 1119   02/14/19 0900  cefTRIAXone (ROCEPHIN) 2 g in sodium chloride 0.9 % 100 mL IVPB     2 g 200 mL/hr over 30 Minutes Intravenous Every 24 hours 02/14/19 0846 02/16/19 1033   02/06/19 1900  penicillin G potassium 8 Million Units in dextrose 5 % 500 mL continuous infusion  Status:  Discontinued     8 Million  Units 41.7 mL/hr over 12 Hours Intravenous Every 12 hours 02/06/19 1900 02/14/19 0846   02/06/19 1100  penicillin G potassium 8 Million Units in dextrose 5 % 500 mL continuous infusion  Status:  Discontinued     8 Million Units 41.7 mL/hr over 12 Hours Intravenous Every 12 hours 02/06/19 1008 02/06/19 1900   02/05/19 2300  penicillin G potassium 4 Million Units in dextrose 5 % 250 mL IVPB  Status:  Discontinued     4 Million Units 250 mL/hr over 60 Minutes Intravenous Every 6 hours 02/04/19 2216 02/04/19 2237   02/04/19 2300  vancomycin (VANCOCIN) 1,500 mg in sodium chloride 0.9 % 500 mL IVPB     1,500 mg 250 mL/hr over 120  Minutes Intravenous  Once 02/04/19 2216 02/05/19 0308   02/04/19 2300  penicillin G potassium 4 Million Units in dextrose 5 % 250 mL IVPB  Status:  Discontinued     4 Million Units 250 mL/hr over 60 Minutes Intravenous Every 6 hours 02/04/19 2237 02/06/19 1008   02/04/19 2230  clindamycin (CLEOCIN) IVPB 600 mg  Status:  Discontinued     600 mg 100 mL/hr over 30 Minutes Intravenous Every 8 hours 02/04/19 2216 02/05/19 0917   02/04/19 0315  piperacillin-tazobactam (ZOSYN) IVPB 3.375 g     3.375 g 100 mL/hr over 30 Minutes Intravenous  Once 02/04/19 0303 02/04/19 0408   02/04/19 0315  vancomycin (VANCOCIN) IVPB 1000 mg/200 mL premix     1,000 mg 200 mL/hr over 60 Minutes Intravenous  Once 02/04/19 0303 02/04/19 0424        Objective:   Vitals:   03/26/19 0750 03/26/19 0859 03/26/19 1135 03/26/19 1144  BP: (!) 116/54 (!) 116/54  106/61  Pulse:  85 83 90  Resp:  16 16 15   Temp: 98.1 F (36.7 C)   97.9 F (36.6 C)  TempSrc:    Oral  SpO2:  100% 96% 100%  Weight:      Height:        Wt Readings from Last 3 Encounters:  03/25/19 74.8 kg     Intake/Output Summary (Last 24 hours) at 03/26/2019 1448 Last data filed at 03/26/2019 1300 Gross per 24 hour  Intake -  Output 195 ml  Net -195 ml     Physical Exam Patient is examined daily including today on  03/26/19 , exams remain the same as of yesterday except that has changed   Gen:- Awake Alert,  Obese, in no acute distress HEENT:- [place].AT, No sclera icterus Neck-Supple Neck, trach  lungs-  CTAB , fair symmetrical air movement CV- S1, S2 normal, regular  Abd-  +ve B.Sounds, Abd Soft, No tenderness,    Extremity/Skin:- , pedal pulses present  Psych-affect is appropriate, oriented x3 Neuro-no new focal deficits, no tremors   Data Review:   Micro Results No results found for this or any previous visit (from the past 240 hour(s)).  Radiology Reports Dg Chest 2 View  Result Date: 03/26/2019 CLINICAL DATA:  Tracheostomy.  Cough and congestion EXAM: CHEST - 2 VIEW COMPARISON:  03/21/2019 FINDINGS: Tracheostomy remains in good position. Improvement in left lower lobe airspace disease. Small left effusion. Right lung remains clear. Negative for heart failure or edema. IMPRESSION: Interval improvement left lower lobe airspace disease. Small left effusion. Electronically Signed   By: Franchot Gallo M.D.   On: 03/26/2019 08:05   Dg Abd 1 View  Result Date: 03/16/2019 CLINICAL DATA:  NG tube placement EXAM: ABDOMEN - 1 VIEW COMPARISON:  03/16/2019 FINDINGS: NG tube tip is in the distal stomach. IMPRESSION: NG tube tip in the distal stomach. Electronically Signed   By: Rolm Baptise M.D.   On: 03/16/2019 23:27   Dg Abd 1 View  Result Date: 03/16/2019 CLINICAL DATA:  NG tube placement EXAM: ABDOMEN - 1 VIEW COMPARISON:  Earlier same day FINDINGS: 1737 hours. Feeding tube no longer evident. NG tube tip is in the mid to distal stomach. Nonspecific bowel gas pattern within the visualized left abdomen. IMPRESSION: NG tube tip is in the mid to distal stomach. Electronically Signed   By: Misty Stanley M.D.   On: 03/16/2019 17:48   Dg Abd 1 View  Result Date: 03/16/2019 CLINICAL DATA:  Feeding  tube placement. EXAM: ABDOMEN - 1 VIEW COMPARISON:  None. FINDINGS: A small bore feeding tube is identified with tip  overlying the distal stomach. The visualized portions of the feeding tube are unremarkable. IMPRESSION: Small bore feeding tube with tip overlying the distal stomach. No abnormalities of the visualized feeding tube identified. Electronically Signed   By: Margarette Canada M.D.   On: 03/16/2019 14:22   Dg Abd 1 View  Result Date: 03/06/2019 CLINICAL DATA:  Ileus EXAM: ABDOMEN - 1 VIEW COMPARISON:  Yesterday FINDINGS: Feeding tube tip is at the pylorus. Gastric suction tube is at the stomach body. Haziness of the lower chest from pulmonary opacity and layering pleural effusions. The visualized bowel gas pattern is normal. Arterial calcification. IMPRESSION: Feeding tube tip at the pylorus and gastric suction tube tip at the gastric body-stable from yesterday Electronically Signed   By: Monte Fantasia M.D.   On: 03/06/2019 05:21   Dg Chest Port 1 View  Result Date: 03/21/2019 CLINICAL DATA:  73 year old female with shortness of breath. Trach collar. EXAM: PORTABLE CHEST 1 VIEW COMPARISON:  03/17/2019 and earlier. FINDINGS: Portable AP upright view at 1025 hours. Stable tracheostomy. Right PICC line has been removed. Enteric tube has been removed. Continued low lung volumes. Regressed pulmonary interstitial opacity/vascular congestion since 03/17/2019, but continued hypo ventilation at the left lung base with patchy opacity. Stable cardiac size and mediastinal contours. No pneumothorax. Paucity of bowel gas in the upper abdomen. IMPRESSION: 1. Regressed pulmonary vascular congestion but continued low lung volumes with left lung base atelectasis or consolidation. 2. Enteric tube removed. Right PICC line removed. Electronically Signed   By: Genevie Ann M.D.   On: 03/21/2019 10:39   Dg Chest Port 1 View  Result Date: 03/17/2019 CLINICAL DATA:  Shortness of breath and congestion EXAM: PORTABLE CHEST 1 VIEW COMPARISON:  March 15, 2019 FINDINGS: Tracheostomy catheter tip is 1.5 cm above the carina. Nasogastric tube tip and  side port are below the diaphragm. Central catheter tip is in the right atrium slightly distal to the superior vena cava-right atrium junction. No pneumothorax. There is cardiomegaly with pulmonary vascularity grossly normal. There is a left pleural effusion with left lower lobe consolidation. There is a smaller right pleural effusion. There is a slight degree of interstitial edema. IMPRESSION: Tube and catheter positions as described without pneumothorax. Stable cardiac prominence. Consolidation with left pleural effusion on the left. Suspect pneumonia, although alveolar edema could present in this manner. There is a smaller right pleural effusion. There is mild interstitial edema. A degree of superimposed congestive heart failure must be of concern. Electronically Signed   By: Lowella Grip III M.D.   On: 03/17/2019 10:02   Dg Chest Port 1 View  Result Date: 03/15/2019 CLINICAL DATA:  73 year old female with chronic respiratory failure EXAM: PORTABLE CHEST 1 VIEW COMPARISON:  Prior chest x-ray 03/14/2019 FINDINGS: The tracheostomy tube remains in good position. The tip is midline and at the level of the clavicles. A feeding tube is present, the tip is not identified but lies off the field of view below the diaphragm and likely within the stomach or small bowel. A right upper extremity PICC is present. The catheter tip overlies the upper right atrium. Stable cardiomegaly. Atherosclerotic calcifications again noted in the transverse aorta. Pulmonary vascular congestion with diffuse mild interstitial prominence and peripheral Kerley B-lines consistent with mild interstitial edema. These findings have progressed compared to yesterday. Patchy left basilar airspace opacity partially obscures the hemidiaphragm. No pneumothorax. No  acute osseous abnormality. IMPRESSION: 1. Slightly increased interstitial pulmonary edema. 2. Persistent left retrocardiac airspace opacity which may reflect atelectasis and/or  infiltrate. 3. Support apparatus in stable and satisfactory position. Electronically Signed   By: Jacqulynn Cadet M.D.   On: 03/15/2019 09:16   Dg Chest Port 1 View  Result Date: 03/14/2019 CLINICAL DATA:  Respiratory failure. EXAM: PORTABLE CHEST 1 VIEW COMPARISON:  One-view chest x-ray 03/11/2019 FINDINGS: Heart size is exaggerated by low lung volumes. Atherosclerotic calcifications are present at the aortic arch. Tracheostomy tube is stable. Right-sided PICC line is in satisfactory position. A feeding tube courses off the inferior border of the film. Left greater than right basilar airspace disease is stable. There is slight increase in mild pulmonary vascular congestion. IMPRESSION: 1. Left greater than right basilar airspace disease is similar the prior study. While this may represent atelectasis, infection is not excluded. 2. Slight increase in pulmonary vascular congestion. 3. Support apparatus is stable. Electronically Signed   By: San Morelle M.D.   On: 03/14/2019 08:15   Dg Chest Port 1 View  Result Date: 03/11/2019 CLINICAL DATA:  Shortness of breath EXAM: PORTABLE CHEST 1 VIEW COMPARISON:  March 06, 2019 FINDINGS: Tracheostomy catheter tip is 4.4 cm above the carina. Central catheter tip is in the right atrium slightly beyond the superior vena cava-right atrium junction. Feeding tube tip is below the diaphragm with tip not seen. No pneumothorax. There is consolidation in the left base with small left pleural effusion. There is mild right base atelectasis. Heart is mildly enlarged with pulmonary vascularity normal. There is aortic atherosclerosis. No adenopathy. No bone lesions. IMPRESSION: Tube and catheter positions as described without pneumothorax. Consolidation in a portion of the left lower lobe, likely pneumonia, with small left pleural effusion. Mild right base atelectasis. Stable cardiac silhouette.  Aortic Atherosclerosis (ICD10-I70.0). Electronically Signed   By: Lowella Grip III M.D.   On: 03/11/2019 10:19   Dg Chest Port 1 View  Result Date: 03/06/2019 CLINICAL DATA:  Pulmonary edema EXAM: PORTABLE CHEST 1 VIEW COMPARISON:  Three days ago FINDINGS: Haziness of the bilateral chest with layering pleural effusions. There is also lung opacity which could be infection or edema. Tracheostomy tube in place. The enteric tubes reache the stomach. Right PICC with tip at the upper cavoatrial junction. No pneumothorax. IMPRESSION: 1. Unremarkable hardware positioning. 2. Unchanged layering pleural effusions and pulmonary opacification. Electronically Signed   By: Monte Fantasia M.D.   On: 03/06/2019 05:37   Dg Chest Port 1 View  Result Date: 03/03/2019 CLINICAL DATA:  Chest pain EXAM: PORTABLE CHEST 1 VIEW COMPARISON:  02/27/2019 FINDINGS: Tracheostomy in good position. Feeding tube enters the stomach with the tip not visualized. Left-sided central line removed. Right-sided PICC tip in the mid right atrium. Mild improvement in diffuse bilateral airspace disease. Small bilateral effusions and bibasilar atelectasis improved. IMPRESSION: Interval improvement in bilateral airspace disease most likely pulmonary edema. Improvement in bibasilar atelectasis and effusion Right arm PICC tip advanced into the right atrium. Electronically Signed   By: Franchot Gallo M.D.   On: 03/03/2019 19:48   Dg Chest Port 1 View  Result Date: 02/27/2019 CLINICAL DATA:  Ventilator, tracheostomy EXAM: PORTABLE CHEST 1 VIEW COMPARISON:  02/25/2019 FINDINGS: Tracheostomy and left central line are unchanged. Interval placement of right PICC line with the tip in the SVC. NG tube enters the stomach. Cardiomegaly. Layering bilateral effusions with bilateral airspace disease, slightly worsening since prior study. No acute bony abnormality. IMPRESSION: Moderate layering  bilateral effusions. Worsening diffuse bilateral airspace disease which could reflect edema or infection. Electronically Signed   By: Rolm Baptise M.D.   On: 02/27/2019 07:55   Portable Chest X-ray  Result Date: 02/25/2019 CLINICAL DATA:  Tracheostomy tube, hypertension, diabetes mellitus EXAM: PORTABLE CHEST 1 VIEW COMPARISON:  Portable exam 1612 hours compared to 02/25/2019 FINDINGS: Olivia Mackie ostomy tube projects over tracheal air column. LEFT subclavian central venous catheter tip projects over RIGHT atrium. Nasogastric tube extends into stomach. Enlargement of cardiac silhouette post coronary stenting. Atherosclerotic calcification aorta. Bibasilar atelectasis versus consolidation. Upper lungs clear. Component of small RIGHT pleural effusion is present. No pneumothorax. IMPRESSION: Bibasilar atelectasis versus consolidation with small RIGHT pleural effusion. Electronically Signed   By: Lavonia Dana M.D.   On: 02/25/2019 16:27   Dg Chest Port 1 View  Result Date: 02/25/2019 CLINICAL DATA:  Respiratory failure EXAM: PORTABLE CHEST 1 VIEW COMPARISON:  02/24/2019 FINDINGS: Endotracheal tube and left subclavian central line are again seen and stable. Gastric catheter has been removed. Cardiac shadow is stable. Aortic calcifications are again noted. Persistent central vascular congestion and bilateral airspace opacities are seen. Bilateral effusions are noted as well. No new focal abnormality is noted. IMPRESSION: Stable appearance of central vascular congestion with patchy bilateral opacities and effusions. Electronically Signed   By: Inez Catalina M.D.   On: 02/25/2019 07:34   Dg Chest Port 1 View  Result Date: 02/24/2019 CLINICAL DATA:  Intubation EXAM: PORTABLE CHEST 1 VIEW COMPARISON:  02/24/2019 FINDINGS: Endotracheal tube tip is 2 cm above the inferior margin of the carina. Left subclavian approach central venous catheter tip projects over the right atrium. There is left basilar atelectasis in bilateral diffuse airspace opacity. IMPRESSION: 1. Endotracheal tube tip 2 cm above the inferior margin of the carina. Retraction of 3 cm would place  it at the level of the clavicular heads. 2. Unchanged diffuse airspace disease. Electronically Signed   By: Ulyses Jarred M.D.   On: 02/24/2019 19:38   Dg Abd Portable 1v  Result Date: 03/18/2019 CLINICAL DATA:  Feeding tube placed EXAM: PORTABLE ABDOMEN - 1 VIEW COMPARISON:  None. FINDINGS: Feeding tube with the tip projecting over the antrum of the stomach. There is no bowel dilatation to suggest obstruction. There is no evidence of pneumoperitoneum, portal venous gas or pneumatosis. There are no pathologic calcifications along the expected course of the ureters. The osseous structures are unremarkable. IMPRESSION: Feeding tube with the tip projecting over the antrum of the stomach. Electronically Signed   By: Kathreen Devoid   On: 03/18/2019 12:48   Dg Abd Portable 1v  Result Date: 03/05/2019 CLINICAL DATA:  NG tube placement EXAM: PORTABLE ABDOMEN - 1 VIEW COMPARISON:  02/24/2019 FINDINGS: NG tube is in the mid stomach. Feeding tube has been placed with the tip in the distal stomach or proximal duodenum. Nonobstructive bowel gas pattern. Diffuse airspace disease throughout the lungs with layering effusions. IMPRESSION: NG tube remains in the mid stomach. Feeding tube tip is in the peri pyloric region. Electronically Signed   By: Rolm Baptise M.D.   On: 03/05/2019 11:15   Dg Abd Portable 1v  Result Date: 02/24/2019 CLINICAL DATA:  73 year old female status post enteric tube placement. EXAM: PORTABLE ABDOMEN - 1 VIEW COMPARISON:  Abdominal radiograph dated 02/22/2019 FINDINGS: Partially visualized enteric tube with tip and side-port in the left hemiabdomen likely in the body of the stomach. No definite bowel dilatation. Partially visualized central venous line with tip over the right atrium.  Bilateral airspace opacities and probable small bilateral pleural effusions. There is atherosclerotic calcification of the aorta as well as coronary vascular calcifications. There is atherosclerotic calcification of  the splenic artery. The osseous structures and soft tissues appear unremarkable. IMPRESSION: Enteric tube with tip and side-port in the left hemiabdomen likely in the body of the stomach. Electronically Signed   By: Anner Crete M.D.   On: 02/24/2019 22:14   Dg Swallowing Func-speech Pathology  Result Date: 03/19/2019 Objective Swallowing Evaluation: Type of Study: MBS-Modified Barium Swallow Study  Patient Details Name: Lively Haberman MRN: [id number] Date of Birth: 1946-11-08 Today's Date: 03/19/2019 Time: SLP Start Time (ACUTE ONLY): 1030 -SLP Stop Time (ACUTE ONLY): 1050 SLP Time Calculation (min) (ACUTE ONLY): 20 min Past Medical History: Past Medical History: Diagnosis Date . Coronary artery disease  . Diabetes mellitus without complication ([facility])  . Hypertension  Past Surgical History: Past Surgical History: Procedure Laterality Date . CORONARY ANGIOPLASTY WITH STENT PLACEMENT   . CORONARY/GRAFT ACUTE MI REVASCULARIZATION N/A 02/09/2019  Procedure: Coronary/Graft Acute MI Revascularization;  Surgeon: Belva Crome, MD;  Location: [place] [facility];  Service: Cardiovascular;  Laterality: N/A; . LEFT HEART CATH AND CORONARY ANGIOGRAPHY N/A 02/09/2019  Procedure: LEFT HEART CATH AND CORONARY ANGIOGRAPHY;  Surgeon: Belva Crome, MD;  Location: [place] [facility];  Service: Cardiovascular;  Laterality: N/A; HPI: 73 y.o. female admitted on 02/03/19 for fever and cough (to [facility]) and was transferred to [facility] on 02/04/19 for hypotension and respiratory distress (acute respiratory failure/pulmonary edema) requiring intubation 3/10.  She was visiting family from [place] and [place] 19 tests were negative.  She was found to have group A strep bacteremia (septic shiock), rhinovirus,  and course complicated by NSTEMI and new LV dysfunction s/p cardiac cath on 02/09/19.  Other dx include ischemic cardiomyopathy, hyperkalemia, DM2- uncontrolled, and abdominal distention.  Pt with significant PMH of DM, HTN,  anc CAD. Trach placed on 3/31  Subjective: pt alert, anxious Assessment / Plan / Recommendation CHL IP CLINICAL IMPRESSIONS 03/19/2019 Clinical Impression Pt has a mild oropharyngeal dysphagia felt to be secondary to prolonged and repeated intubations and overall deconditioning. She has reduced lingual manipulation and slow, incomplete oral clearance, with lingual residue increasing as boluses become more solid. Base of tongue retraction, hyolaryngeal movement, and epiglottic inversion are all reduced. This results in decreased laryngeal vestibule closure, and pt has aspiration before and during the swallow with thin and nectar thick liquids that cannot be cleared fully given intermittent spontaneous coughing. Aforementioned weakness also results in trace vallecular residue across consistencies but also with the barium tablet remaining lodged in the valleculae, requiring multiple pureed and honey thick liquid boluses to clear. Considering the above as well as her mentation and language barrier, limited compensatory strategies were attempted (although also not found to be effective). Recommend starting Dys 2 diet and honey thick liquids with PMV in place and full supervision provided. Will f/u for tolerance and hopeful improvement given additional therapy and time post-extubation. SLP Visit Diagnosis Dysphagia, oropharyngeal phase (R13.12) Attention and concentration deficit following -- Frontal lobe and executive function deficit following -- Impact on safety and function Moderate aspiration risk;Mild aspiration risk   CHL IP TREATMENT RECOMMENDATION 03/19/2019 Treatment Recommendations Therapy as outlined in treatment plan below   Prognosis 03/19/2019 Prognosis for Safe Diet Advancement Good Barriers to Reach Goals -- Barriers/Prognosis Comment -- CHL IP DIET RECOMMENDATION 03/19/2019 SLP Diet Recommendations Dysphagia 2 (Fine chop) solids;Honey thick liquids Liquid Administration via  Cup;Straw Medication  Administration Crushed with puree Compensations Slow rate;Small sips/bites Postural Changes Seated upright at 90 degrees   CHL IP OTHER RECOMMENDATIONS 03/19/2019 Recommended Consults -- Oral Care Recommendations Oral care BID Other Recommendations Order thickener from pharmacy;Prohibited food (jello, ice cream, thin soups);Remove water pitcher;Place PMSV during PO intake   CHL IP FOLLOW UP RECOMMENDATIONS 03/19/2019 Follow up Recommendations Skilled Nursing facility   [facility] IP FREQUENCY AND DURATION 03/19/2019 Speech Therapy Frequency (ACUTE ONLY) min 2x/week Treatment Duration 2 weeks      CHL IP ORAL PHASE 03/19/2019 Oral Phase Impaired Oral - Pudding Teaspoon -- Oral - Pudding Cup -- Oral - Honey Teaspoon Reduced posterior propulsion;Lingual/palatal residue Oral - Honey Cup Reduced posterior propulsion;Lingual/palatal residue Oral - Nectar Teaspoon Reduced posterior propulsion;Lingual/palatal residue Oral - Nectar Cup -- Oral - Nectar Straw -- Oral - Thin Teaspoon Premature spillage Oral - Thin Cup -- Oral - Thin Straw -- Oral - Puree Reduced posterior propulsion;Lingual/palatal residue Oral - Mech Soft Reduced posterior propulsion;Lingual/palatal residue;Impaired mastication Oral - Regular -- Oral - Multi-Consistency -- Oral - Pill Delayed oral transit Oral Phase - Comment --  CHL IP PHARYNGEAL PHASE 03/19/2019 Pharyngeal Phase Impaired Pharyngeal- Pudding Teaspoon -- Pharyngeal -- Pharyngeal- Pudding Cup -- Pharyngeal -- Pharyngeal- Honey Teaspoon Reduced epiglottic inversion;Reduced anterior laryngeal mobility;Reduced laryngeal elevation;Reduced airway/laryngeal closure;Reduced tongue base retraction;Pharyngeal residue - valleculae Pharyngeal -- Pharyngeal- Honey Cup Reduced epiglottic inversion;Reduced anterior laryngeal mobility;Reduced laryngeal elevation;Reduced airway/laryngeal closure;Reduced tongue base retraction;Pharyngeal residue - valleculae Pharyngeal -- Pharyngeal- Nectar Teaspoon Reduced epiglottic  inversion;Reduced anterior laryngeal mobility;Reduced laryngeal elevation;Reduced airway/laryngeal closure;Reduced tongue base retraction;Penetration/Aspiration before swallow;Pharyngeal residue - valleculae Pharyngeal Material enters airway, passes BELOW cords and not ejected out despite cough attempt by patient Pharyngeal- Nectar Cup -- Pharyngeal -- Pharyngeal- Nectar Straw -- Pharyngeal -- Pharyngeal- Thin Teaspoon Reduced epiglottic inversion;Reduced anterior laryngeal mobility;Reduced laryngeal elevation;Reduced airway/laryngeal closure;Reduced tongue base retraction;Penetration/Aspiration during swallow Pharyngeal Material enters airway, passes BELOW cords and not ejected out despite cough attempt by patient Pharyngeal- Thin Cup -- Pharyngeal -- Pharyngeal- Thin Straw -- Pharyngeal -- Pharyngeal- Puree Reduced epiglottic inversion;Reduced anterior laryngeal mobility;Reduced laryngeal elevation;Reduced airway/laryngeal closure;Reduced tongue base retraction;Pharyngeal residue - valleculae Pharyngeal -- Pharyngeal- Mechanical Soft Reduced epiglottic inversion;Reduced anterior laryngeal mobility;Reduced laryngeal elevation;Reduced airway/laryngeal closure;Reduced tongue base retraction;Pharyngeal residue - valleculae Pharyngeal -- Pharyngeal- Regular -- Pharyngeal -- Pharyngeal- Multi-consistency -- Pharyngeal -- Pharyngeal- Pill Reduced epiglottic inversion;Reduced anterior laryngeal mobility;Reduced laryngeal elevation;Reduced airway/laryngeal closure;Reduced tongue base retraction;Pharyngeal residue - valleculae Pharyngeal -- Pharyngeal Comment --  CHL IP CERVICAL ESOPHAGEAL PHASE 03/19/2019 Cervical Esophageal Phase WFL Pudding Teaspoon -- Pudding Cup -- Honey Teaspoon -- Honey Cup -- Nectar Teaspoon -- Nectar Cup -- Nectar Straw -- Thin Teaspoon -- Thin Cup -- Thin Straw -- Puree -- Mechanical Soft -- Regular -- Multi-consistency -- Pill -- Cervical Esophageal Comment -- Venita Sheffield Nix 03/19/2019, 5:19 PM  Pollyann Glen, M.A. CCC-SLP Acute Rehabilitation Services Pager [phone] Office [phone]             Ir Picc Placement Right >5 Yrs Inc Img Guide  Result Date: 02/26/2019 INDICATION: Poor venous access. In need of durable intravenous access for medication administration and blood draws. EXAM: ULTRASOUND AND FLUOROSCOPIC GUIDED PICC LINE INSERTION MEDICATIONS: None. CONTRAST:  None FLUOROSCOPY TIME:  36 seconds (1.2 mGy) COMPLICATIONS: None immediate. TECHNIQUE: The procedure, risks, benefits, and alternatives were explained to the patient and informed written consent was obtained. A timeout was performed prior to the initiation of the procedure. The right upper extremity was prepped  with chlorhexidine in a sterile fashion, and a sterile drape was applied covering the operative field. Maximum barrier sterile technique with sterile gowns and gloves were used for the procedure. A timeout was performed prior to the initiation of the procedure. Local anesthesia was provided with 1% lidocaine. Under direct ultrasound guidance, the basilic vein was accessed with a micropuncture kit after the overlying soft tissues were anesthetized with 1% lidocaine. After the overlying soft tissues were anesthetized, a small venotomy incision was created and a micropuncture kit was utilized to access the right basilic vein. Real-time ultrasound guidance was utilized for vascular access including the acquisition of a permanent ultrasound image documenting patency of the accessed vessel. A guidewire was advanced to the level of the superior caval-atrial junction for measurement purposes and the PICC line was cut to length. A peel-away sheath was placed and a 40 cm, 5 [place], dual lumen was inserted to level of the superior caval-atrial junction. A post procedure spot fluoroscopic was obtained. The catheter easily aspirated and flushed and was sutured in place. A dressing was placed. The patient tolerated the procedure well without  immediate post procedural complication. FINDINGS: After catheter placement, the tip lies within the superior cavoatrial junction. The catheter aspirates and flushes normally and is ready for immediate use. IMPRESSION: Successful ultrasound and fluoroscopic guided placement of a right basilic vein approach, 40 cm, 5 French, dual lumen PICC with tip at the superior caval-atrial junction. The PICC line is ready for immediate use. Electronically Signed   By: Sandi Mariscal M.D.   On: 02/26/2019 14:36   [place] Ekg Site Rite  Result Date: 02/24/2019 If Site Rite image not attached, placement could not be confirmed due to current cardiac rhythm.    CBC Recent Labs  Lab 03/20/19 0736 03/21/19 0447 03/22/19 0736 03/24/19 0629 03/26/19 0620  WBC 11.0* 9.7 7.2 6.9 7.7  HGB 9.0* 8.6* 8.1* 8.3* 8.7*  HCT 30.8* 29.3* 27.0* 27.3* 29.4*  PLT 270 248 217 232 279  MCV 89.0 88.5 86.3 85.3 87.2  MCH 26.0 26.0 25.9* 25.9* 25.8*  MCHC 29.2* 29.4* 30.0 30.4 29.6*  RDW 16.9* 16.7* 16.6* 16.6* 16.9*  LYMPHSABS  --   --   --  2.2  --   MONOABS  --   --   --  0.7  --   EOSABS  --   --   --  0.1  --   BASOSABS  --   --   --  0.0  --     Chemistries  Recent Labs  Lab 03/20/19 0736 03/21/19 0447 03/22/19 0736 03/24/19 0629 03/26/19 0620  NA 147* 142 137 138 140  K 4.3 4.2 3.6 3.6 3.8  CL 105 101 100 98 97*  CO2 31 28 28 29 29   GLUCOSE 81 104* 95 148* 113*  BUN 52* 39* 30* 24* 19  CREATININE 1.28* 1.19* 1.07* 1.31* 1.18*  CALCIUM 9.1 9.0 8.3* 8.6* 9.1  MG  --   --   --  2.1  --    ------------------------------------------------------------------------------------------------------------------ No results for input(s): CHOL, HDL, LDLCALC, TRIG, CHOLHDL, LDLDIRECT in the last 72 hours.  Lab Results  Component Value Date   HGBA1C 10.1 (H) 02/04/2019   ------------------------------------------------------------------------------------------------------------------ No results for input(s): TSH, T4TOTAL,  T3FREE, THYROIDAB in the last 72 hours.  Invalid input(s): FREET3 ------------------------------------------------------------------------------------------------------------------ No results for input(s): VITAMINB12, FOLATE, FERRITIN, TIBC, IRON, RETICCTPCT in the last 72 hours.  Coagulation profile No results for input(s): INR, PROTIME in the last  168 hours.  No results for input(s): DDIMER in the last 72 hours.  Cardiac Enzymes No results for input(s): CKMB, TROPONINI, MYOGLOBIN in the last 168 hours.  Invalid input(s): CK ------------------------------------------------------------------------------------------------------------------    Component Value Date/Time   BNP 596.6 (H) 03/03/2019 2429     Roxan Hockey M.D on 03/26/2019 at 2:48 PM  Go to [email or web address] - for contact info  [facility] - Office  [phone]

## 2019-03-26 NOTE — Progress Notes (Signed)
Inpatient Rehab Admissions Coordinator:   Continuing to follow.  Per discussion with RN and documentation from PCCM, pt stable to transfer to CIR with trach and continue capping trials in this venue.  Will discuss with pt and daughter for possible rehab admission tomorrow pending bed availability.   Estill Dooms, PT, DPT Admissions Coordinator [phone] 03/26/19  11:58 AM

## 2019-03-26 NOTE — Progress Notes (Signed)
In last 15 min have suctioned pt 4 times. No results. Showed pt suction catheter so she could see no results. Pt coughed hard but still nonproductive. Pt given Ativan for anxiety. RT notified of above and will come see her. Explained to then assisted pt to recliner in rm

## 2019-03-26 NOTE — Progress Notes (Signed)
Occupational Therapy Treatment Patient Details Name: Gabrielle Wright MRN: [id number] DOB: 21-Jan-1946 Today's Date: 03/26/2019    History of present illness 73 y.o. female admitted on 02/03/19 for fever and cough (to [facility]) and was transferred to [facility] on 02/04/19 for hypotension and respiratory distress (acute respiratory failure/pulmonary edema) requiring intubation 3/10-current (time of PT eval 02/27/2019).  She was visiting family from [place] and COVID 19 tests were negative.  She was found to have group A strep bacteremia (septic shiock), rhinovirus,  and course complicated by NSTEMI and new LV dysfunction s/p cardiac cath on 02/09/19.  Cardiology following.  Other dx include ischemic cardiomyopathy, hyperkalemia, DM2- uncontrolled, and abdominal distention.  Pt with significant PMH of DM, HTN, anc CAD. Trach placed on 3/31   OT comments  Pt continues to improve for sitting at sink for light grooming tasks with suction and talking with MD about progress using stratus interpreter.  Pt performing tranfers and ADL functional mobility with minA to minguardA with RW and fair activity tolerance. Pt poorly motivated to more than the minimum, but showing some good progress today by standing at sink x1 min prior to sitting down and wanting to ambulate before sitting in recliner. Pt tolerating session well. O2 sats >92% on RA. Pt performing minguardA for toilet hygiene. Pt requiring minA overall for ADL and continues to require rest breaks to accomplish tasks. Pt would greatly benefit from continued OT skilled services for ADL, mobility and safety in CIR setting. OT to follow acutely.    Follow Up Recommendations  CIR;Supervision/Assistance - 24 hour(could possibly adjust to [facility] if pt continues to progress)    Equipment Recommendations       Recommendations for Other Services      Precautions / Restrictions Precautions Precautions: Fall Precaution Comments: trach, on 28%   Restrictions Weight Bearing Restrictions: No       Mobility Bed Mobility Overal bed mobility: Needs Assistance Bed Mobility: Sit to Supine Rolling: Min assist         General bed mobility comments: sitting on BSC for RN  Transfers Overall transfer level: Needs assistance Equipment used: Rolling walker (2 wheeled) Transfers: Sit to/from Stand Sit to Stand: Min assist         General transfer comment: Assist for balance. Tactile cues for hand placement    Balance Overall balance assessment: Needs assistance Sitting-balance support: Feet supported;No upper extremity supported Sitting balance-Leahy Scale: Fair Sitting balance - Comments: brief sitting EOB with min guard to min assist staticall y   Standing balance support: Bilateral upper extremity supported;During functional activity Standing balance-Leahy Scale: Fair Standing balance comment: walker and min guard for static standing                           ADL either performed or assessed with clinical judgement   ADL Overall ADL's : Needs assistance/impaired     Grooming: Set up;Sitting                               Functional mobility during ADLs: Minimal assistance;Rolling walker General ADL Comments: fatigues very quickly     Vision   Vision Assessment?: No apparent visual deficits   Perception     Praxis      Cognition Arousal/Alertness: Awake/alert Behavior During Therapy: WFL for tasks assessed/performed Overall Cognitive Status: Difficult to assess  General Comments: Follows commands and answers questions through interpreter.        Exercises     Shoulder Instructions       General Comments use dinterpreter: [employer] 100026.    Pertinent Vitals/ Pain       Pain Assessment: No/denies pain Pain Intervention(s): Monitored during session  Home Living                                           Prior Functioning/Environment              Frequency  Min 3X/week        Progress Toward Goals  OT Goals(current goals can now be found in the care plan section)  Progress towards OT goals: Progressing toward goals  Acute Rehab OT Goals Patient Stated Goal: to go home OT Goal Formulation: With family Time For Goal Achievement: 04/03/19 Potential to Achieve Goals: Good ADL Goals Pt Will Perform Grooming: with modified independence Pt Will Transfer to Toilet: with set-up;ambulating;regular height toilet Pt Will Perform Toileting - Clothing Manipulation and hygiene: with modified independence;sit to/from stand Pt/caregiver will Perform Home Exercise Program: Increased strength;Both right and left upper extremity;With written HEP provided Additional ADL Goal #1: Pt will tolerate sitting EOB during functional tasks for 10 minutes with min guard for safety. Additional ADL Goal #2: Pt will perform bed mobility with moderate assist in preparation for ADL. Additional ADL Goal #3: Pt will follow 1-2 step commands with 90% accuracy in order to optimize participation in ADLs. Additional ADL Goal #4: Pt will perform ADL functional mobility in room with standing x5 mins with supervisionA for safety.  Plan Discharge plan remains appropriate;Frequency remains appropriate;Discharge plan needs to be updated    Co-evaluation                 AM-PAC OT "6 Clicks" Daily Activity     Outcome Measure   Help from another person eating meals?: None Help from another person taking care of personal grooming?: A Little Help from another person toileting, which includes using toliet, bedpan, or urinal?: A Little Help from another person bathing (including washing, rinsing, drying)?: A Little Help from another person to put on and taking off regular upper body clothing?: A Little Help from another person to put on and taking off regular lower body clothing?: A Little 6 Click Score: 19     End of Session Equipment Utilized During Treatment: Rolling walker;Gait belt  OT Visit Diagnosis: Muscle weakness (generalized) (M62.81);Other symptoms and signs involving cognitive function   Activity Tolerance Patient tolerated treatment well   Patient Left in chair;with call bell/phone within reach;with chair alarm set   Nurse Communication Mobility status        Time: 5615-3794 OT Time Calculation (min): 27 min  Charges: OT General Charges $OT Visit: 1 Visit OT Treatments $Self Care/Home Management : 8-22 mins $Therapeutic Activity: 8-22 mins  Cristi Loron) Glendell Docker OTR/L Acute Rehabilitation Services Pager: [phone] Office: [phone]    Lonzo Cloud 03/26/2019, 2:55 PM

## 2019-03-26 NOTE — Progress Notes (Addendum)
Physical Therapy Treatment Patient Details Name: Gabrielle Wright MRN: [id number] DOB: 1946-05-04 Today's Date: 03/26/2019    History of Present Illness 73 y.o. female admitted on 02/03/19 for fever and cough (to [facility]) and was transferred to [facility] on 02/04/19 for hypotension and respiratory distress (acute respiratory failure/pulmonary edema) requiring intubation 3/10-current (time of PT eval 02/27/2019).  She was visiting family from [place] and COVID 19 tests were negative.  She was found to have group A strep bacteremia (septic shiock), rhinovirus,  and course complicated by NSTEMI and new LV dysfunction s/p cardiac cath on 02/09/19.  Cardiology following.  Other dx include ischemic cardiomyopathy, hyperkalemia, DM2- uncontrolled, and abdominal distention.  Pt with significant PMH of DM, HTN, anc CAD. Trach placed on 3/31    PT Comments    Pt making good progress. Showing incr activity tolerance.    Follow Up Recommendations  CIR;Supervision for mobility/OOB     Equipment Recommendations  Other (comment)    Recommendations for Other Services       Precautions / Restrictions Precautions Precautions: Fall Precaution Comments: trach, on 28%  Restrictions Weight Bearing Restrictions: No    Mobility  Bed Mobility               General bed mobility comments: Pt up with OT  Transfers Overall transfer level: Needs assistance Equipment used: Rolling walker (2 wheeled) Transfers: Sit to/from Stand Sit to Stand: Min assist         General transfer comment: Assist for balance. Tactile cues for hand placement  Ambulation/Gait Ambulation/Gait assistance: Min assist Gait Distance (Feet): 40 Feet(x 2) Assistive device: Rolling walker (2 wheeled) Gait Pattern/deviations: Step-through pattern;Decreased stride length;Trunk flexed Gait velocity: decr Gait velocity interpretation: <1.31 ft/sec, indicative of household ambulator General Gait Details: Assist for balance  and support. Pt amb on RA with SpO2 >92%. Pt with 1 sitting rest break of 2 minutes   Stairs             Wheelchair Mobility    Modified Rankin (Stroke Patients Only)       Balance Overall balance assessment: Needs assistance Sitting-balance support: Feet supported;No upper extremity supported Sitting balance-Leahy Scale: Fair     Standing balance support: Bilateral upper extremity supported;During functional activity Standing balance-Leahy Scale: Fair Standing balance comment: walker and min guard for static standing                            Cognition Arousal/Alertness: Awake/alert Behavior During Therapy: WFL for tasks assessed/performed Overall Cognitive Status: Difficult to assess                                        Exercises      General Comments        Pertinent Vitals/Pain Pain Assessment: Faces Faces Pain Scale: Hurts a little bit Pain Location: generalized Pain Descriptors / Indicators: Grimacing Pain Intervention(s): Monitored during session;Limited activity within patient's tolerance    Home Living                      Prior Function            PT Goals (current goals can now be found in the care plan section) Progress towards PT goals: Progressing toward goals    Frequency    Min 3X/week  PT Plan Current plan remains appropriate    Co-evaluation              AM-PAC PT "6 Clicks" Mobility   Outcome Measure  Help needed turning from your back to your side while in a flat bed without using bedrails?: A Little Help needed moving from lying on your back to sitting on the side of a flat bed without using bedrails?: A Little Help needed moving to and from a bed to a chair (including a wheelchair)?: A Little Help needed standing up from a chair using your arms (e.g., wheelchair or bedside chair)?: A Little Help needed to walk in hospital room?: A Little Help needed climbing 3-5 steps  with a railing? : Total 6 Click Score: 16    End of Session Equipment Utilized During Treatment: Gait belt Activity Tolerance: Patient tolerated treatment well Patient left: with call bell/phone within reach;in chair;with chair alarm set Nurse Communication: Mobility status PT Visit Diagnosis: Muscle weakness (generalized) (M62.81);Other abnormalities of gait and mobility (R26.89)     Time: 7939-6886 PT Time Calculation (min) (ACUTE ONLY): 9 min  Charges:  $Gait Training: 8-22 mins                     [facility] PT Acute Rehabilitation Services Pager [phone] Office [phone]    Angelina Ok [facility] 03/26/2019, 10:46 AM

## 2019-03-27 ENCOUNTER — Other Ambulatory Visit: Payer: Self-pay

## 2019-03-27 ENCOUNTER — Inpatient Hospital Stay (HOSPITAL_COMMUNITY)
Admission: RE | Admit: 2019-03-27 | Discharge: 2019-04-05 | DRG: 945 | Disposition: A | Discharge status: Home Health | Payer: Medicaid Other | Source: Intra-hospital | Attending: Physical Medicine & Rehabilitation | Admitting: Physical Medicine & Rehabilitation

## 2019-03-27 ENCOUNTER — Encounter (HOSPITAL_COMMUNITY): Payer: Self-pay

## 2019-03-27 DIAGNOSIS — Z794 Long term (current) use of insulin: Secondary | ICD-10-CM

## 2019-03-27 DIAGNOSIS — E785 Hyperlipidemia, unspecified: Secondary | ICD-10-CM | POA: Diagnosis present

## 2019-03-27 DIAGNOSIS — N183 Chronic kidney disease, stage 3 unspecified: Secondary | ICD-10-CM

## 2019-03-27 DIAGNOSIS — I4891 Unspecified atrial fibrillation: Secondary | ICD-10-CM | POA: Diagnosis present

## 2019-03-27 DIAGNOSIS — Z79899 Other long term (current) drug therapy: Secondary | ICD-10-CM

## 2019-03-27 DIAGNOSIS — Z955 Presence of coronary angioplasty implant and graft: Secondary | ICD-10-CM | POA: Diagnosis not present

## 2019-03-27 DIAGNOSIS — R1310 Dysphagia, unspecified: Secondary | ICD-10-CM | POA: Diagnosis present

## 2019-03-27 DIAGNOSIS — Z7902 Long term (current) use of antithrombotics/antiplatelets: Secondary | ICD-10-CM | POA: Diagnosis not present

## 2019-03-27 DIAGNOSIS — F411 Generalized anxiety disorder: Secondary | ICD-10-CM | POA: Diagnosis present

## 2019-03-27 DIAGNOSIS — I214 Non-ST elevation (NSTEMI) myocardial infarction: Secondary | ICD-10-CM | POA: Diagnosis present

## 2019-03-27 DIAGNOSIS — E669 Obesity, unspecified: Secondary | ICD-10-CM | POA: Diagnosis present

## 2019-03-27 DIAGNOSIS — E1122 Type 2 diabetes mellitus with diabetic chronic kidney disease: Secondary | ICD-10-CM | POA: Diagnosis present

## 2019-03-27 DIAGNOSIS — J9611 Chronic respiratory failure with hypoxia: Secondary | ICD-10-CM | POA: Diagnosis not present

## 2019-03-27 DIAGNOSIS — I5043 Acute on chronic combined systolic (congestive) and diastolic (congestive) heart failure: Secondary | ICD-10-CM

## 2019-03-27 DIAGNOSIS — I2510 Atherosclerotic heart disease of native coronary artery without angina pectoris: Secondary | ICD-10-CM | POA: Diagnosis present

## 2019-03-27 DIAGNOSIS — Z930 Tracheostomy status: Secondary | ICD-10-CM

## 2019-03-27 DIAGNOSIS — R5381 Other malaise: Secondary | ICD-10-CM | POA: Diagnosis present

## 2019-03-27 DIAGNOSIS — I130 Hypertensive heart and chronic kidney disease with heart failure and stage 1 through stage 4 chronic kidney disease, or unspecified chronic kidney disease: Secondary | ICD-10-CM | POA: Diagnosis present

## 2019-03-27 DIAGNOSIS — E1169 Type 2 diabetes mellitus with other specified complication: Secondary | ICD-10-CM | POA: Diagnosis not present

## 2019-03-27 DIAGNOSIS — I10 Essential (primary) hypertension: Secondary | ICD-10-CM

## 2019-03-27 DIAGNOSIS — I255 Ischemic cardiomyopathy: Secondary | ICD-10-CM | POA: Diagnosis present

## 2019-03-27 DIAGNOSIS — I482 Chronic atrial fibrillation, unspecified: Secondary | ICD-10-CM | POA: Diagnosis not present

## 2019-03-27 DIAGNOSIS — R4702 Dysphasia: Secondary | ICD-10-CM | POA: Diagnosis present

## 2019-03-27 DIAGNOSIS — Z7982 Long term (current) use of aspirin: Secondary | ICD-10-CM | POA: Diagnosis not present

## 2019-03-27 DIAGNOSIS — D649 Anemia, unspecified: Secondary | ICD-10-CM | POA: Diagnosis present

## 2019-03-27 DIAGNOSIS — F419 Anxiety disorder, unspecified: Secondary | ICD-10-CM | POA: Diagnosis present

## 2019-03-27 DIAGNOSIS — K219 Gastro-esophageal reflux disease without esophagitis: Secondary | ICD-10-CM | POA: Diagnosis present

## 2019-03-27 DIAGNOSIS — I5042 Chronic combined systolic (congestive) and diastolic (congestive) heart failure: Secondary | ICD-10-CM | POA: Diagnosis present

## 2019-03-27 DIAGNOSIS — R0602 Shortness of breath: Secondary | ICD-10-CM

## 2019-03-27 LAB — GLUCOSE, CAPILLARY
Glucose-Capillary: 123 mg/dL — ABNORMAL HIGH (ref 70–99)
Glucose-Capillary: 122 mg/dL — ABNORMAL HIGH (ref 70–99)
Glucose-Capillary: 254 mg/dL — ABNORMAL HIGH (ref 70–99)
Glucose-Capillary: 105 mg/dL — ABNORMAL HIGH (ref 70–99)
Glucose-Capillary: 184 mg/dL — ABNORMAL HIGH (ref 70–99)

## 2019-03-27 MED ORDER — ACETAMINOPHEN 325 MG PO TABS
650.0000 mg | ORAL_TABLET | Freq: Four times a day (QID) | ORAL | Status: DC | PRN
  Administered 2019-03-28 – 2019-04-04 (×13): 650 mg via ORAL
  Filled 2019-03-27 (×13): qty 2

## 2019-03-27 MED ORDER — RESOURCE THICKENUP CLEAR PO POWD
ORAL | Status: DC | PRN
  Filled 2019-03-27 (×2): qty 125

## 2019-03-27 MED ORDER — LACTULOSE 10 GM/15ML PO SOLN: 10.0000 g | Freq: Every day | ORAL | Status: DC | PRN

## 2019-03-27 MED ORDER — LORAZEPAM 0.5 MG PO TABS
0.5000 mg | ORAL_TABLET | Freq: Four times a day (QID) | ORAL | Status: DC | PRN
  Administered 2019-03-27 – 2019-04-03 (×10): 0.5 mg via ORAL
  Filled 2019-03-27 (×10): qty 1

## 2019-03-27 MED ORDER — HYDRALAZINE HCL 10 MG PO TABS
10.0000 mg | ORAL_TABLET | Freq: Two times a day (BID) | ORAL | Status: DC
  Administered 2019-03-27 – 2019-04-05 (×18): 10 mg via ORAL
  Filled 2019-03-27 (×18): qty 1

## 2019-03-27 MED ORDER — ISOSORBIDE MONONITRATE ER 30 MG PO TB24
30.0000 mg | ORAL_TABLET | Freq: Every day | ORAL | Status: DC
  Administered 2019-03-28 – 2019-04-05 (×9): 30 mg via ORAL
  Filled 2019-03-27 (×9): qty 1

## 2019-03-27 MED ORDER — GUAIFENESIN 100 MG/5ML PO SOLN
5.0000 mL | ORAL | Status: DC | PRN
  Administered 2019-03-27 – 2019-03-30 (×8): 100 mg via ORAL
  Filled 2019-03-27: qty 25
  Filled 2019-03-27 (×3): qty 5
  Filled 2019-03-27: qty 25
  Filled 2019-03-27 (×3): qty 5

## 2019-03-27 MED ORDER — INSULIN ASPART 100 UNIT/ML ~~LOC~~ SOLN
0.0000 [IU] | SUBCUTANEOUS | Status: DC
  Administered 2019-03-27: 4 [IU] via SUBCUTANEOUS
  Administered 2019-03-28: 2 [IU] via SUBCUTANEOUS
  Administered 2019-03-28: 4 [IU] via SUBCUTANEOUS

## 2019-03-27 MED ORDER — PANTOPRAZOLE SODIUM 40 MG PO PACK
40.0000 mg | PACK | Freq: Every day | ORAL | Status: DC
  Filled 2019-03-27: qty 20

## 2019-03-27 MED ORDER — DIPHENHYDRAMINE HCL 25 MG PO CAPS: 25.0000 mg | ORAL_CAPSULE | Freq: Four times a day (QID) | ORAL | 0 refills | Status: DC | PRN

## 2019-03-27 MED ORDER — MONTELUKAST SODIUM 10 MG PO TABS
10.0000 mg | ORAL_TABLET | Freq: Every day | ORAL | Status: DC
  Administered 2019-03-27 – 2019-04-04 (×9): 10 mg via ORAL
  Filled 2019-03-27 (×9): qty 1

## 2019-03-27 MED ORDER — POLYETHYLENE GLYCOL 3350 17 G PO PACK
17.0000 g | PACK | Freq: Two times a day (BID) | ORAL | Status: DC
  Administered 2019-03-27 – 2019-04-05 (×18): 17 g via ORAL
  Filled 2019-03-27 (×18): qty 1

## 2019-03-27 MED ORDER — SPIRONOLACTONE 25 MG PO TABS: 12.5000 mg | ORAL_TABLET | Freq: Every day | ORAL | 2 refills | Status: DC

## 2019-03-27 MED ORDER — SERTRALINE HCL 50 MG PO TABS: 50.0000 mg | ORAL_TABLET | Freq: Every day | ORAL | 2 refills | Status: DC

## 2019-03-27 MED ORDER — ADULT MULTIVITAMIN W/MINERALS CH
1.0000 | ORAL_TABLET | Freq: Every day | ORAL | Status: DC
  Administered 2019-03-28 – 2019-04-05 (×9): 1 via ORAL
  Filled 2019-03-27 (×9): qty 1

## 2019-03-27 MED ORDER — ADULT MULTIVITAMIN W/MINERALS CH: 1.0000 | ORAL_TABLET | Freq: Every day | ORAL | 3 refills | Status: AC

## 2019-03-27 MED ORDER — ACETAMINOPHEN 325 MG PO TABS: 650.0000 mg | ORAL_TABLET | Freq: Four times a day (QID) | ORAL | 1 refills | Status: AC | PRN

## 2019-03-27 MED ORDER — INSULIN GLARGINE 100 UNIT/ML ~~LOC~~ SOLN: 15.0000 [IU] | Freq: Every day | SUBCUTANEOUS | 11 refills | Status: DC

## 2019-03-27 MED ORDER — GUAIFENESIN 100 MG/5ML PO SOLN: 5.0000 mL | ORAL | 0 refills | Status: DC | PRN

## 2019-03-27 MED ORDER — ASPIRIN 81 MG PO CHEW
81.0000 mg | CHEWABLE_TABLET | Freq: Every day | ORAL | Status: DC
  Administered 2019-03-28 – 2019-04-05 (×9): 81 mg via ORAL
  Filled 2019-03-27 (×9): qty 1

## 2019-03-27 MED ORDER — SERTRALINE HCL 50 MG PO TABS
50.0000 mg | ORAL_TABLET | Freq: Every day | ORAL | Status: DC
  Administered 2019-03-28 – 2019-04-05 (×9): 50 mg via ORAL
  Filled 2019-03-27 (×9): qty 1

## 2019-03-27 MED ORDER — LOSARTAN POTASSIUM 25 MG PO TABS: 25.0000 mg | ORAL_TABLET | Freq: Every day | ORAL | 3 refills | Status: DC

## 2019-03-27 MED ORDER — INSULIN GLARGINE 100 UNIT/ML ~~LOC~~ SOLN
15.0000 [IU] | Freq: Every day | SUBCUTANEOUS | Status: DC
  Administered 2019-03-27 – 2019-04-04 (×9): 15 [IU] via SUBCUTANEOUS
  Filled 2019-03-27 (×11): qty 0.15

## 2019-03-27 MED ORDER — TRAZODONE HCL 50 MG PO TABS: 50.0000 mg | ORAL_TABLET | Freq: Every day | ORAL | 2 refills | Status: DC

## 2019-03-27 MED ORDER — ASPIRIN 81 MG PO CHEW: 81.0000 mg | CHEWABLE_TABLET | Freq: Every day | ORAL | 2 refills | Status: AC

## 2019-03-27 MED ORDER — LORAZEPAM 0.5 MG PO TABS: 0.5000 mg | ORAL_TABLET | Freq: Four times a day (QID) | ORAL | 0 refills | Status: DC | PRN

## 2019-03-27 MED ORDER — CARVEDILOL 3.125 MG PO TABS: 3.1250 mg | ORAL_TABLET | Freq: Two times a day (BID) | ORAL | 2 refills | Status: DC

## 2019-03-27 MED ORDER — ATORVASTATIN CALCIUM 40 MG PO TABS
40.0000 mg | ORAL_TABLET | Freq: Every day | ORAL | Status: DC
  Administered 2019-03-27 – 2019-04-04 (×9): 40 mg via ORAL
  Filled 2019-03-27 (×9): qty 1

## 2019-03-27 MED ORDER — AMIODARONE HCL 200 MG PO TABS: 200.0000 mg | ORAL_TABLET | Freq: Every day | ORAL | 2 refills | Status: DC

## 2019-03-27 MED ORDER — CARVEDILOL 3.125 MG PO TABS
3.1250 mg | ORAL_TABLET | Freq: Two times a day (BID) | ORAL | Status: DC
  Administered 2019-03-27 – 2019-04-05 (×17): 3.125 mg via ORAL
  Filled 2019-03-27 (×18): qty 1

## 2019-03-27 MED ORDER — SENNA 8.6 MG PO TABS: 2.0000 | ORAL_TABLET | Freq: Two times a day (BID) | ORAL | Status: DC | PRN

## 2019-03-27 MED ORDER — IPRATROPIUM-ALBUTEROL 0.5-2.5 (3) MG/3ML IN SOLN
3.0000 mL | RESPIRATORY_TRACT | Status: DC | PRN
  Administered 2019-03-27: 3 mL via RESPIRATORY_TRACT
  Filled 2019-03-27: qty 3

## 2019-03-27 MED ORDER — SIMETHICONE 80 MG PO CHEW
40.0000 mg | CHEWABLE_TABLET | Freq: Four times a day (QID) | ORAL | Status: DC | PRN
  Administered 2019-04-02: 40 mg via ORAL
  Filled 2019-03-27 (×2): qty 1

## 2019-03-27 MED ORDER — LOSARTAN POTASSIUM 50 MG PO TABS
25.0000 mg | ORAL_TABLET | Freq: Every day | ORAL | Status: DC
  Administered 2019-03-28 – 2019-04-05 (×9): 25 mg via ORAL
  Filled 2019-03-27 (×9): qty 1

## 2019-03-27 MED ORDER — TRAZODONE HCL 50 MG PO TABS
50.0000 mg | ORAL_TABLET | Freq: Every day | ORAL | Status: DC
  Administered 2019-03-27 – 2019-04-04 (×9): 50 mg via ORAL
  Filled 2019-03-27 (×9): qty 1

## 2019-03-27 MED ORDER — ISOSORBIDE MONONITRATE ER 30 MG PO TB24: 30.0000 mg | ORAL_TABLET | Freq: Every day | ORAL | 2 refills | Status: DC

## 2019-03-27 MED ORDER — AMIODARONE HCL 200 MG PO TABS
200.0000 mg | ORAL_TABLET | Freq: Every day | ORAL | Status: DC
  Administered 2019-03-28 – 2019-04-05 (×9): 200 mg via ORAL
  Filled 2019-03-27 (×9): qty 1

## 2019-03-27 MED ORDER — SPIRONOLACTONE 12.5 MG HALF TABLET
12.5000 mg | ORAL_TABLET | Freq: Every day | ORAL | Status: DC
  Administered 2019-03-28 – 2019-04-05 (×9): 12.5 mg via ORAL
  Filled 2019-03-27 (×9): qty 1

## 2019-03-27 MED ORDER — IPRATROPIUM-ALBUTEROL 0.5-2.5 (3) MG/3ML IN SOLN: 3.0000 mL | RESPIRATORY_TRACT | 1 refills | Status: DC | PRN

## 2019-03-27 MED ORDER — FUROSEMIDE 40 MG PO TABS
40.0000 mg | ORAL_TABLET | Freq: Every day | ORAL | Status: DC
  Administered 2019-03-28 – 2019-04-05 (×9): 40 mg via ORAL
  Filled 2019-03-27 (×9): qty 1

## 2019-03-27 NOTE — Progress Notes (Signed)
RT Note:  Was called and told patient was having SOB.  When I got to room, RN from dayshift had already pulled a Duoneb for patient.  Removed Passey Muir valve and administered treatment.  Rapid had translater explain that PM needs to be removed unless patient is using to talk.  Patient appears to be anxious about being moved to a different room, but sats are good and BS are somewhat diminished but patient sounds clear.  Patient will not lay back in bed to relax, has expressed it makes SOB worse.  Will continue to monitor patient.

## 2019-03-27 NOTE — Significant Event (Addendum)
Rapid Response Event Note  Overview:Called to room d/t pt saying she can't breathe Time Called: 1925 Arrival Time: 1930 Event Type: Respiratory  Initial Focused Assessment: Pt sitting on side of bed, very anxious, mildly tachypneic, lungs diminished t/o. PMV had been taken off pt.  Placed PMV on so pt could speak via translator. Per translator, pt concerned because she says she can't breath. She says she feels like there is something stuck in her throat. She is also concerned about moving rooms (pt moved to rehab around 1500 today) and that the room is too loud.  T-98.0, HR-76, BP-117/57, RR-24, Sp02-96% on TC .28. Pt concerned that there isn't anyone in the room with her and that she can't talk to anyone with her PMV off.  PTA RRT, bedside RN had suctioned trach and checked inner cannula.  Interventions: PRN Duoneb given 0.5mg  Ativan given PO Passey muir valve removed from patient after explaining need to leave it off while sleeping. Instructed pt (thru translator) how to call RN with the call light and that PMV can be put back on if pt needs to talk.  After these interventions, pt less anxious, RR: 18-20, and pt says she feels better.  Plan of Care (if not transferred): Continue to monitor pt. Call RRT if further assistance needed. Event Summary:   at      at    Outcome: Stayed in room and stabalized  Event End Time: 2030  Terrilyn Saver

## 2019-03-27 NOTE — Discharge Instructions (Signed)
1) speech pathologist to continue to work with patient 2) pulmonologist to continue to work with patient with regards to her tracheostomy---Capping of tracheostomy Trial. Per pulmonologist defer decannulation beyond customary 24 hours due to prolong course but plan to decannulation prior to discharge from acute rehab 3)Use Novolog/Humalog Sliding scale insulin with Accu-Cheks/Fingersticks as ordered

## 2019-03-27 NOTE — TOC Transition Note (Signed)
Transition of Care [facility]) - CM/SW Discharge Note   Patient Details  Name: Gabrielle Wright MRN: [id number] Date of Birth: Sep 17, 1946  Transition of Care [facility]) CM/SW Contact:  Leone Haven, RN Phone Number: 03/27/2019, 9:37 AM   Clinical Narrative:    Patient for dc to CIR today.   Final next level of care: IP Rehab Facility Barriers to Discharge: No Barriers Identified   Patient Goals and CMS Choice Patient states their goals for this hospitalization and ongoing recovery are:: Pt daughter would like her mother to attend rehab to get better CMS Medicare.gov Compare Post Acute Care list provided to:: Other (Comment Required)(NA) Choice offered to / list presented to : NA  Discharge Placement                       Discharge Plan and Services In-house Referral: Clinical Social Work Discharge Planning Services: CM Consult Post Acute Care Choice: IP Rehab          DME Arranged: N/A DME Agency: NA       HH Arranged: NA HH Agency: NA        Social Determinants of Health (SDOH) Interventions     Readmission Risk Interventions Readmission Risk Prevention Plan 03/25/2019  Transportation Screening Complete  Medication Review [facility]) Complete  HRI or Home Care Consult Not Complete  HRI or Home Care Consult Pt Refusal Comments going to CIR   SW Recovery Care/Counseling Consult Not Complete  SW Consult Not Complete Comments NA  Palliative Care Screening Not Applicable  Skilled Nursing Facility Complete

## 2019-03-27 NOTE — Progress Notes (Signed)
  Speech Language Pathology Treatment: Gabrielle Wright Speaking valve  Patient Details Name: Gabrielle Wright MRN: [id number] DOB: 1946/05/17 Today's Date: 03/27/2019 Time: 1135-1150 SLP Time Calculation (min) (ACUTE ONLY): 15 min  Assessment / Plan / Recommendation Clinical Impression  Pt remains anxious, communicating through [facility] interpreter that she feels like she cannot breathe, although SpO2 was at 100% and there was no air trapping when PMV was checked. Otherwise, PMV was tolerated well throughout session. Although still mildly dysphonic, her vocal quality and intensity have improved since this SLP last saw her for her MBS on 4/22. Encouragement was given to try to eat or drink from her lunch tray, but pt declined. Suspect that she is ready for repeat MBS, but given that she will not eat or drink for me at this time and she is scheduled to go to CIR today, will defer to their discretion.    HPI HPI: 73 y.o. female admitted on 02/03/19 for fever and cough (to [facility]) and was transferred to [facility] on 02/04/19 for hypotension and respiratory distress (acute respiratory failure/pulmonary edema) requiring intubation 3/10.  She was visiting family from [place] and COVID 19 tests were negative.  She was found to have group A strep bacteremia (septic shiock), rhinovirus,  and course complicated by NSTEMI and new LV dysfunction s/p cardiac cath on 02/09/19.  Other dx include ischemic cardiomyopathy, hyperkalemia, DM2- uncontrolled, and abdominal distention.  Pt with significant PMH of DM, HTN, anc CAD. Trach placed on 3/31      SLP Plan  Continue with current plan of care       Recommendations  Diet recommendations: Dysphagia 2 (fine chop);Honey-thick liquid Liquids provided via: Cup;Straw Medication Administration: Crushed with puree Supervision: Patient able to self feed;Intermittent supervision to cue for compensatory strategies Compensations: Slow rate;Small sips/bites Postural  Changes and/or Swallow Maneuvers: Seated upright 90 degrees      Patient may use Passy-Muir Speech Valve: Intermittently with supervision;During all therapies with supervision;Caregiver trained to provide supervision;During PO intake/meals PMSV Supervision: Intermittent MD: Please consider changing trach tube to : Cuffless;Smaller size         Oral Care Recommendations: Oral care BID Follow up Recommendations: Inpatient Rehab 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.1) Plan: Continue with current plan of care       GO                [name] Slayde Brault 03/27/2019, 11:54 AM  [name], M.A. CCC-SLP Acute Herbalist [phone] Office [phone]

## 2019-03-27 NOTE — H&P (Addendum)
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  Per chart review patient is from [place] was visiting family in the area.  Reportedly independent prior to admission.  Presented 02/03/2019 with fever as well as cough to [facility] and was transferred to [facility] 02/04/2019 for hypertension/septic shock, respiratory distress requiring intubation.  She was maintained on IV pressors.  Lactic acid 1.7.  COVID-19 test negative.  She was found to have group A strep bacteremia with septic shock.  Hospital course complicated by non-STEMI identified per EKG.  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  Underwent cardiac catheterization 02/09/2019 per Dr. Verdis Prime showing 20 to 30% diffuse narrowing within the proximal LAD stent and diffuse narrowing in the distal LAD apical segment, patent stent in the mid to distal obtuse marginal with 89% stenosis distal to the stent with active thrombus noted and currently maintained on aspirin.  Bouts of atrial fibrillation with RVR continued on aspirin therapy as well as the addition of amiodarone.  Due to ongoing respiratory failure with hypoxemia with failed extubation patient underwent tracheostomy tube placement 02/24/2019 per Dr. Molli Knock and has been downsized to a #4 cuffless.  Maintained on a dysphagia #2 honey thick liquid diet.  Patient currently undergoing capping with ultimate plans to decannulate.  Acute on chronic combined systolic and diastolic congestive heart failure monitoring for  any signs of fluid overload and remains on Lasix therapy.  Acute on chronic anemia hemoglobin ranging from 8.1-9.0.  Palliative care has been consulted for establishing goals of care.  Therapy evaluations completed and patient was admitted for a comprehensive rehab program   Review of Systems  Unable to perform ROS: Language        Past Medical History:  Diagnosis Date   Coronary artery disease     Diabetes mellitus without complication (HCC)     Hypertension           Past Surgical History:  Procedure Laterality Date   CORONARY ANGIOPLASTY WITH STENT PLACEMENT       CORONARY/GRAFT ACUTE MI REVASCULARIZATION N/A 02/09/2019    Procedure: Coronary/Graft Acute MI Revascularization;  Surgeon: Lyn Records, MD;  Location: [facility] INVASIVE CV LAB;  Service: Cardiovascular;  Laterality: N/A;   LEFT HEART CATH AND CORONARY ANGIOGRAPHY N/A 02/09/2019    Procedure: LEFT HEART CATH AND CORONARY ANGIOGRAPHY;  Surgeon: Lyn Records, MD;  Location: [facility] INVASIVE CV LAB;  Service: Cardiovascular;  Laterality: N/A;    History reviewed. No pertinent family history. Social History:  reports that she has never smoked. She has never used smokeless tobacco. She reports that she does not drink alcohol or use drugs. Allergies: No Known Allergies       Medications Prior to Admission  Medication Sig Dispense Refill   albuterol (VENTOLIN HFA) 108 (90 Base) MCG/ACT inhaler Inhale 2 puffs into the lungs every 6 (six) hours as needed for wheezing or shortness of breath.       atorvastatin (LIPITOR) 80 MG tablet Take 80 mg by mouth at bedtime.       bisoprolol (ZEBETA) 5 MG tablet  Take 5 mg by mouth daily.       Calcium Carbonate-Vitamin D3 (CALCIUM 600+D3) 600-400 MG-UNIT TABS Take 1 tablet by mouth daily.       clopidogrel (PLAVIX) 75 MG tablet Take 75 mg by mouth daily.       febuxostat (ULORIC) 40 MG tablet Take 40 mg by mouth daily.       ferrous sulfate 325 (65 FE) MG tablet Take 325 mg by mouth  daily with breakfast.       furosemide (LASIX) 40 MG tablet Take 40 mg by mouth daily.       Insulin Isophane & Regular Human (HUMULIN 70/30 KWIKPEN) (70-30) 100 UNIT/ML PEN Inject 28-30 Units into the skin 2 (two) times daily.       insulin regular (HUMULIN R) 100 units/mL injection Inject 10 Units into the skin daily with lunch.       linaGLIPtin-metFORMIN HCl 2.5-500 MG TABS Take 1 tablet by mouth daily.       montelukast (SINGULAIR) 10 MG tablet Take 10 mg by mouth at bedtime.       NON FORMULARY Take 1 tablet by mouth See admin instructions. NEVRALIP 600 RETARD- (Nevralip 600 retard is a Food Supplement containing Lipoic Acid, Chromium Picolinate, Vitamins and Minerals. Selenium, Zinc and Vitamin E contribute to the protection of cells from oxidative stress): Take 1 tablet by mouth once a day or as otherwise directed       pantoprazole (PROTONIX) 40 MG tablet Take 40 mg by mouth daily before breakfast.       pregabalin (LYRICA) 75 MG capsule Take 75 mg by mouth 2 (two) times daily.       PRESCRIPTION MEDICATION Take 16 mg by mouth See admin instructions. BETAHISTINE DIHYDROCHLORIDE 16 MG TABLETS: Take 16 mg by mouth daily or as otherwise directed       spironolactone (ALDACTONE) 50 MG tablet Take 50 mg by mouth daily.       telmisartan (MICARDIS) 80 MG tablet Take 80 mg by mouth daily.          Drug Regimen Review Drug regimen was reviewed and remains appropriate with no significant issues identified   Home: Home Living Family/patient expects to be discharged to:: Private residence Living Arrangements: Children, Other relatives Available Help at Discharge: Family, Available 24 hours/day Type of Home: House Home Access: Level entry Home Layout: Two level, Able to live on main level with bedroom/bathroom Alternate Level Stairs-Number of Steps: full flight Alternate Level Stairs-Rails: Right Bathroom Shower/Tub: Engineer, manufacturing systems: Standard Bathroom  Accessibility: Yes Additional Comments: unsure, pt is unable to report and family is not currently present.   Lives With: Daughter   Functional History: Prior Function Level of Independence: Independent Comments: pt visits the [place] 7 months out of the year, was at her daughter's x 10 days when she became ill   Functional Status:  Mobility: Bed Mobility Overal bed mobility: Needs Assistance Bed Mobility: Sit to Supine Rolling: Min assist Sidelying to sit: Mod assist Supine to sit: Mod assist, HOB elevated Sit to supine: Min assist General bed mobility comments: sitting on BSC for RN Transfers Overall transfer level: Needs assistance Equipment used: Rolling walker (2 wheeled) Transfers: Sit to/from Stand Sit to Stand: Min assist Stand pivot transfers: Mod assist, +2 physical assistance, +2 safety/equipment  Lateral/Scoot Transfers: Total assist General transfer comment: Assist for balance. Tactile cues for hand placement Ambulation/Gait Ambulation/Gait assistance: Min assist Gait Distance (Feet): 40 Feet(x 2) Assistive device: Rolling walker (  2 wheeled) Gait Pattern/deviations: Step-through pattern, Decreased stride length, Trunk flexed General Gait Details: Assist for balance and support. Pt amb on RA with SpO2 >92%. Pt with 1 sitting rest break of 2 minutes Gait velocity: decr Gait velocity interpretation: <1.31 ft/sec, indicative of household ambulator   ADL: ADL Overall ADL's : Needs assistance/impaired Eating/Feeding: Sitting, Supervision/ safety Eating/Feeding Details (indicate cue type and reason): pt does not fully appreciate her swallowing precautions Grooming: Set up, Sitting Grooming Details (indicate cue type and reason): stood at sink for washing hands, sat abruptly to dry, fatigues easily Upper Body Bathing: Set up, Bed level Upper Body Bathing Details (indicate cue type and reason): for back EOB Lower Body Bathing: Moderate assistance, Sitting/lateral leans,  Bed level Toilet Transfer: Moderate assistance, RW, BSC Toilet Transfer Details (indicate cue type and reason): recliner to EOB Toileting- Clothing Manipulation and Hygiene: Total assistance, Sit to/from stand Toileting - Clothing Manipulation Details (indicate cue type and reason): 3rd person helpful for rear perianal care Functional mobility during ADLs: Minimal assistance, Rolling walker General ADL Comments: fatigues very quickly   Cognition: Cognition Overall Cognitive Status: Difficult to assess Orientation Level: Oriented X4 Cognition Arousal/Alertness: Awake/alert Behavior During Therapy: WFL for tasks assessed/performed Overall Cognitive Status: Difficult to assess General Comments: Follows commands and answers questions through interpreter. Difficult to assess due to: Tracheostomy, Non-English speaking   Physical Exam: Blood pressure (!) 119/56, pulse 85, temperature 98.6 F (37 C), temperature source Oral, resp. rate 16, height  (1.676 m), weight 74.8 kg, SpO2 100 %. Physical Exam  Neck:  Tracheostomy tube in place  Neurological:  Alert.  Sitting up in chair.  Makes good eye contact with examiner.  Follows simple demonstrated commands.  Patient does not speak English but appears to understand some simple words      General: No acute distress Mood and affect are appropriate Heart: Regular rate and rhythm no rubs murmurs or extra sounds Lungs: Clear to auscultation, breathing unlabored, no rales or wheezes Abdomen: Positive bowel sounds, soft nontender to palpation, nondistended Extremities: No clubbing, cyanosis, 1+ pretibial edema Skin: No evidence of breakdown, no evidence of rash Neurologic: Cranial nerves II through XII intact, motor strength is 4/5 in bilateral deltoid, bicep, tricep, grip, hip flexor, knee extensors, ankle dorsiflexor and plantar flexor Sensory exam difficult to perform on lower extremities due to being ticklish Cerebellar exam normal finger  to nose to finger as well as heel to shin in bilateral upper and lower extremities Musculoskeletal: Full range of motion in all 4 extremities. No joint swelling Lab Results Last 48 Hours        Results for orders placed or performed during the hospital encounter of 02/03/19 (from the past 48 hour(s))  Glucose, capillary     Status: Abnormal    Collection Time: 03/25/19  8:20 AM  Result Value Ref Range    Glucose-Capillary 123 (H) 70 - 99 mg/dL  Glucose, capillary     Status: Abnormal    Collection Time: 03/25/19 12:21 PM  Result Value Ref Range    Glucose-Capillary 241 (H) 70 - 99 mg/dL  Glucose, capillary     Status: Abnormal    Collection Time: 03/25/19  4:23 PM  Result Value Ref Range    Glucose-Capillary 195 (H) 70 - 99 mg/dL  Glucose, capillary     Status: Abnormal    Collection Time: 03/25/19  8:58 PM  Result Value Ref Range    Glucose-Capillary 108 (H) 70 - 99 mg/dL  Glucose,  capillary     Status: Abnormal    Collection Time: 03/26/19 12:26 AM  Result Value Ref Range    Glucose-Capillary 165 (H) 70 - 99 mg/dL  Glucose, capillary     Status: Abnormal    Collection Time: 03/26/19  4:00 AM  Result Value Ref Range    Glucose-Capillary 174 (H) 70 - 99 mg/dL  CBC     Status: Abnormal    Collection Time: 03/26/19  6:20 AM  Result Value Ref Range    WBC 7.7 4.0 - 10.5 K/uL    RBC 3.37 (L) 3.87 - 5.11 MIL/uL    Hemoglobin 8.7 (L) 12.0 - 15.0 g/dL    HCT 78.229.4 (L) 95.636.0 - 46.0 %    MCV 87.2 80.0 - 100.0 fL    MCH 25.8 (L) 26.0 - 34.0 pg    MCHC 29.6 (L) 30.0 - 36.0 g/dL    RDW 21.316.9 (H) 08.611.5 - 15.5 %    Platelets 279 150 - 400 K/uL    nRBC 0.0 0.0 - 0.2 %      Comment: Performed at [facility], [address] [place], [place] [address]  Basic metabolic panel     Status: Abnormal    Collection Time: 03/26/19  6:20 AM  Result Value Ref Range    Sodium 140 135 - 145 mmol/L    Potassium 3.8 3.5 - 5.1 mmol/L    Chloride 97 (L) 98 - 111 mmol/L    CO2 29 22 - 32 mmol/L     Glucose, Bld 113 (H) 70 - 99 mg/dL    BUN 19 8 - 23 mg/dL    Creatinine, Ser 9.621.18 (H) 0.44 - 1.00 mg/dL    Calcium 9.1 8.9 - 95.210.3 mg/dL    GFR calc non Af Amer 46 (L) >60 mL/min    GFR calc Af Amer 53 (L) >60 mL/min    Anion gap 14 5 - 15      Comment: Performed at [facility], [address] [place], [place] [address]  Glucose, capillary     Status: None    Collection Time: 03/26/19  7:54 AM  Result Value Ref Range    Glucose-Capillary 83 70 - 99 mg/dL  Glucose, capillary     Status: Abnormal    Collection Time: 03/26/19 11:43 AM  Result Value Ref Range    Glucose-Capillary 201 (H) 70 - 99 mg/dL  Glucose, capillary     Status: Abnormal    Collection Time: 03/26/19  5:36 PM  Result Value Ref Range    Glucose-Capillary 187 (H) 70 - 99 mg/dL  Glucose, capillary     Status: Abnormal    Collection Time: 03/26/19  7:44 PM  Result Value Ref Range    Glucose-Capillary 247 (H) 70 - 99 mg/dL  Glucose, capillary     Status: Abnormal    Collection Time: 03/26/19 11:36 PM  Result Value Ref Range    Glucose-Capillary 128 (H) 70 - 99 mg/dL  Glucose, capillary     Status: Abnormal    Collection Time: 03/27/19  4:19 AM  Result Value Ref Range    Glucose-Capillary 123 (H) 70 - 99 mg/dL       Imaging Results (Last 48 hours)  Dg Chest 2 View   Result Date: 03/26/2019 CLINICAL DATA:  Tracheostomy.  Cough and congestion EXAM: CHEST - 2 VIEW COMPARISON:  03/21/2019 FINDINGS: Tracheostomy remains in good position. Improvement in left lower lobe airspace disease. Small left  effusion. Right lung remains clear. Negative for heart failure or edema. IMPRESSION: Interval improvement left lower lobe airspace disease. Small left effusion. Electronically Signed   By: Marlan Palau M.D.   On: 03/26/2019 08:05             Medical Problem List and Plan: 1.  Debility secondary to septic shock/acute on chronic combined systolic diastolic congestive heart failure/respiratory failure /non-STEMI 2.   Antithrombotics: -DVT/anticoagulation: SCDs             -antiplatelet therapy: Aspirin 81 mg daily 3. Pain Management: Tylenol as needed 4. Mood: Zoloft 50 mg daily, trazodone 50 mg nightly, Ativan 0.5 mg as needed             -antipsychotic agents: N/A 5. Neuropsych: This patient is capable of making decisions on her own behalf. 6. Skin/Wound Care: Routine skin checks 7. Fluids/Electrolytes/Nutrition: Routine in and outs with follow-up chemistries 8.  Respiratory failure.  Status post tracheostomy 02/24/2019 downsized to #4 cuffless.  Awaiting plan by critical care for decannulation soon 9.  Non-STEMI.  Continue aspirin.  Follow-up cardiology services 10.  Acute on chronic combined diastolic congestive heart failure.  Continue Lasix 40 mg daily.  Monitor for any signs of fluid overload 11.  Atrial fibrillation with RVR.  Amiodarone 200 mg daily.  Cardiac rate controlled 12.  Hypertension.  Coreg 3.125 mg twice daily, hydralazine 10 mg twice daily, Imdur 30 mg daily, Cozaar 25 mg daily, Aldactone 12.5 mg daily 13.  Dysphagia.  Dysphasia #2 honey thick liquids.  Follow-up speech therapy.  Monitor hydration 14.  CKD stage III.  Follow-up chemistries 15.  Acute on chronic anemia.  Follow-up CBC 16.  Diabetes mellitus.  Hemoglobin A1c 10.1.  Lantus insulin 15 units nightly.  Check blood sugars before meals and at bedtime 17.  Hyperlipidemia.  Lipitor   Post Admission Physician Evaluation: 1. Functional deficits secondary  to Debility. 2. Patient admitted to receive collaborative, interdisciplinary care between the physiatrist, rehab nursing staff, and therapy team. 3. Patient's level of medical complexity and substantial therapy needs in context of that medical necessity cannot be provided at a lesser intensity of care. 4. Patient has experienced substantial functional loss from his/her baseline. Upon functional assessment at the time of the preadmission screening, patient was +2 Mod ADL, +2 min  Mobility.  Upon most recent functional evaluation, patient was MinG mobility and Min A ADL.  Judging by the patient's diagnosis, physical exam, and functional history, the patient has potential for functional progress which will result in measurable gains while on inpatient rehab.  These gains will be of substantial and practical use upon discharge in facilitating mobility and self-care at the household level. 5. Physiatrist will provide 24 hour management of medical needs as well as oversight of the therapy plan/treatment and provide guidance as appropriate regarding the interaction of the two. 6. 24 hour rehab nursing will assist in the management of  bladder management, bowel management, safety, skin/wound care, disease management, medication administration, pain management and patient education  and help integrate therapy concepts, techniques,education, etc. 7. PT will assess and treat for:pre gait, gait training, endurance , safety, equipment, neuromuscular re education  .  Goals are:Sup/ independent with assistive device. 8. OT will assess and treat for  .ADLs, Cognitive perceptual skills, Neuromuscular re education, safety, endurance, equipment  Goals are:Sup/ independent with assistive device.  9. SLP will assess and treat for swallowing, goals to advance diet to nectar thick  .  Goals are:  independent with increased time, adequate intake, goals to upgrade to D3. 10. Case Management and Social Worker will assess and treat for psychological issues and discharge planning. 11. Team conference will be held weekly to assess progress toward goals and to determine barriers to discharge. 12.  Patient will receive at least 3 hours of therapy per day at least 5 days per week. 13. ELOS and Prognosis: 7 to 10 days good  "I have personally performed a face to face diagnostic evaluation of this patient.  Additionally, I have reviewed and concur with the physician assistant's documentation above."  Erick Colace M.D. [facility] FAAPM&R (Sports Med, Neuromuscular Med) Diplomate Am Board of Electrodiagnostic Med     Charlton Amor, PA-C 03/27/2019

## 2019-03-27 NOTE — Progress Notes (Signed)
Inpatient Rehab Admissions Coordinator:   I have approval from Dr. Mariea Clonts for pt to admit to CIR today.  I will let CM, RN, and family know.    Estill Dooms, PT, DPT Admissions Coordinator [phone] 03/27/19  9:27 AM

## 2019-03-27 NOTE — Progress Notes (Signed)
Called by pt's daughter via telephone stating pt c/o difficulty breathing. This Clinical research associate and oncoming nurse went to pt room. Pt sitting upright on side of bed. Breathing labored at 26. VSS and noted. Lungs generally diminshed. Also c/o sore throat. Denies any other symptoms. Did suction trach again and scant amount of thin, clear, secretions returned. No occlusive in inner trach cannula. Removed passey muir valve to increase breathing. Rapid nurse responded and at pt side. Given oral ativan. RT arrived and at bedside assessing pt. Care to be continued by ongoing nurse. Utilized translator [phone] to assess/communicate with pt.

## 2019-03-27 NOTE — Progress Notes (Signed)
Michel Santee, PT  Rehab Admission Coordinator  Physical Medicine and Rehabilitation  PMR Pre-admission  Addendum  Date of Service:  03/25/2019 11:36 AM       Related encounter: ED to Hosp-Admission (Current) from 02/03/2019 in [place] [facility]         Show:Clear all [x] Manual[x] Template[x] Copied  Added by: [x] Michel Santee, PT  [] Hover for details PMR Admission Coordinator Pre-Admission Assessment  Patient: Gabrielle Wright is an 72 y.o., female MRN: [id number] DOB: [birth year] Height: 5' 6"  (167.6 cm) Weight: 74.8 kg                                                                                                                                                  Insurance Information HMO:     PPO:      PCP:      IPA:      80/20:      OTHER:  PRIMARY: Medicaid [place] Access      Policy#: [id number]      Subscriber: patient Verified via [employer] and over phone on 03/24/2019 Coverage Code [id number]  Medicaid Application Date:       Case Manager:  Disability Application Date:       Case Worker:   The "Data Collection Information Summary" for patients in Inpatient Rehabilitation Facilities with attached "Privacy Act [place] Records" was provided and verbally reviewed with: N/A  Emergency Contact Information         Contact Information    Name Relation Home Work Mobile   [place] Daughter   [phone]   Dina Rich   [phone]     Current Medical History  Patient Admitting Diagnosis: Debility after exacerbation of CHF as well as respiratory failure  History of Present Illness: Gabrielle Wright a 73 y.o.right handed non-English-speakingfemalewith history of CAD with STEMI/stenting 2018 maintained on Plavix, diabetes mellitus,CKD stage III, hypertension.  Admitted 02/03/2019 with fever as well as cough to [facility] and was transferred to [facility] 02/04/2019 for hypertension,  respiratory distress requiring intubation. Patient was placed on IV pressors. Covid 19 test negative. She was found to have group A strep bacteremia with septic shock. Hospital course complicated by non-STEMI,identified per EKG. 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 Underwent cardiac catheterization 02/09/2019 per Dr. Daneen Schick. Showing 20-30% diffuse Nehring within the proximal LAD stent and diffuse narrowing in the distal LAD apical segment. Patient's stent in the mid to distal obtuse marginal with 80-90% stenosis distal to the stent with active thrombus noted and currently maintained on aspirin. Bouts of atrial fibrillation with RVR remained on aspirin therapy. Due to ongoing respiratory failure with hypoxemia tracheostomy tube was placed 02/24/2019 per Dr. Nelda Marseille. She has been downsized to a size 4 cuffless trach and tolerating on 28% fiO2 5L at rest, but  able to complete limited mobilization on room air and maintain good O2 sats.  Capping trials are ongoing, pt is limited by anxiety with capping.  Per PCCM, pt okay to continue capping trials on CIR.  Goal for tolerating trach capped at 72 hours prior to decannulation. Patient is currently on a dysphagia #2 honey thick liquid diet and speech therapy follow-up evaluating swallow as well as possible need for gastrostomy tube for nutritional support. Acute on chronic anemia 9.0 and monitored.  Glasgow Coma Scale Score: (!) 20  Past Medical History      Past Medical History:  Diagnosis Date  . Coronary artery disease   . Diabetes mellitus without complication ([place])   . Hypertension     Family History  family history is not on file.  Prior Rehab/Hospitalizations:  Has the patient had prior rehab or hospitalizations prior to admission? No  Has the patient had major surgery during 100 days prior to admission? Yes, Cardiac cath on 02/09/2019 and trach placed on 02/24/2019  Current  Medications   Current Facility-Administered Medications:  .  0.9 %  sodium chloride infusion, 250 mL, Intravenous, Continuous, Belva Crome, MD, Stopped at 03/04/19 0815 .  acetaminophen (TYLENOL) tablet 650 mg, 650 mg, Oral, Q6H PRN, Skeet Simmer, RPH, 650 mg at 03/27/19 0827 .  amiodarone (PACERONE) tablet 200 mg, 200 mg, Oral, Daily, Skeet Simmer, RPH, 200 mg at 03/26/19 6195 .  aspirin chewable tablet 81 mg, 81 mg, Oral, Daily, Skeet Simmer, RPH, 81 mg at 03/26/19 0932 .  atorvastatin (LIPITOR) tablet 40 mg, 40 mg, Oral, q1800, Skeet Simmer, RPH, 40 mg at 03/26/19 1818 .  bisacodyl (DULCOLAX) suppository 10 mg, 10 mg, Rectal, Daily PRN, Bowser, Laurel Dimmer, NP .  carvedilol (COREG) tablet 3.125 mg, 3.125 mg, Oral, BID WC, Skeet Simmer, RPH, 3.125 mg at 03/25/19 1648 .  chlorhexidine gluconate (MEDLINE KIT) (PERIDEX) 0.12 % solution 15 mL, 15 mL, Mouth Rinse, BID, Simpson, Paula B, NP, 15 mL at 03/26/19 2101 .  coconut oil, 1 application, Topical, PRN, Dana Allan I, MD, 1 application at 67/12/45 2216 .  dextrose 50 % solution 25 mL, 25 mL, Intravenous, PRN, Belva Crome, MD, 25 mL at 03/05/19 0753 .  diphenhydrAMINE (BENADRYL) capsule 25 mg, 25 mg, Oral, Q6H PRN, Ogan, Okoronkwo U, MD, 25 mg at 03/26/19 2100 .  furosemide (LASIX) tablet 40 mg, 40 mg, Oral, Daily, Regalado, Belkys A, MD, 40 mg at 03/26/19 0918 .  guaiFENesin (ROBITUSSIN) 100 MG/5ML solution 100 mg, 5 mL, Oral, Q4H PRN, Jari Favre, Gorica, MD, 100 mg at 03/27/19 0827 .  hydrALAZINE (APRESOLINE) tablet 10 mg, 10 mg, Oral, BID, Emokpae, Courage, MD, 10 mg at 03/26/19 2100 .  CBG monitoring, , , Q4H **AND** insulin aspart (novoLOG) injection 0-24 Units, 0-24 Units, Subcutaneous, Q4H, Belva Crome, MD, 2 Units at 03/27/19 0827 .  insulin glargine (LANTUS) injection 15 Units, 15 Units, Subcutaneous, QHS, Alekh, Kshitiz, MD, 15 Units at 03/26/19 2100 .  ipratropium-albuterol (DUONEB) 0.5-2.5 (3) MG/3ML nebulizer  solution 3 mL, 3 mL, Nebulization, Q4H PRN, Rigoberto Noel, MD, 3 mL at 03/24/19 0100 .  isosorbide mononitrate (IMDUR) 24 hr tablet 30 mg, 30 mg, Oral, Daily, Emokpae, Courage, MD .  lactulose (CHRONULAC) 10 GM/15ML solution 10 g, 10 g, Oral, Daily PRN, Skeet Simmer, RPH, 10 g at 03/26/19 1207 .  lidocaine (PF) (XYLOCAINE) 1 % injection, , , PRN, Sandi Mariscal, MD, 5 mL at 02/26/19 1304 .  LORazepam (ATIVAN) tablet 0.5 mg, 0.5 mg, Oral, Q6H PRN, Starla Link, Kshitiz, MD, 0.5 mg at 03/26/19 2100 .  losartan (COZAAR) tablet 25 mg, 25 mg, Oral, Daily, Skeet Simmer, RPH, 25 mg at 03/26/19 0737 .  MEDLINE mouth rinse, 15 mL, Mouth Rinse, BID, Regalado, Belkys A, MD, 15 mL at 03/26/19 2102 .  montelukast (SINGULAIR) tablet 10 mg, 10 mg, Oral, QHS, Dana Allan I, MD, 10 mg at 03/26/19 2100 .  morphine 10 MG/5ML solution 2.5 mg, 2.5 mg, Oral, Q6H PRN, Dana Allan I, MD, 2.5 mg at 03/20/19 1119 .  multivitamin with minerals tablet 1 tablet, 1 tablet, Oral, Daily, Skeet Simmer, [facility], 1 tablet at 03/26/19 0915 .  ondansetron (ZOFRAN) injection 4 mg, 4 mg, Intravenous, Q6H PRN, Belva Crome, MD, 4 mg at 03/23/19 1139 .  pantoprazole sodium (PROTONIX) 40 mg/20 mL oral suspension 40 mg, 40 mg, Oral, Daily, Skeet Simmer, RPH, 40 mg at 03/26/19 1058 .  polyethylene glycol (MIRALAX / GLYCOLAX) packet 17 g, 17 g, Oral, BID, Svalina, Gorica, MD, 17 g at 03/26/19 2100 .  Resource ThickenUp Clear, , Oral, PRN, Mannam, Praveen, MD .  senna (SENOKOT) tablet 17.2 mg, 2 tablet, Oral, BID PRN, Skeet Simmer, RPH .  sertraline (ZOLOFT) tablet 50 mg, 50 mg, Oral, Daily, Skeet Simmer, RPH, 50 mg at 03/26/19 1062 .  simethicone (MYLICON) chewable tablet 40 mg, 40 mg, Oral, Q6H PRN, Skeet Simmer, RPH, 40 mg at 03/26/19 1207 .  spironolactone (ALDACTONE) tablet 12.5 mg, 12.5 mg, Oral, Daily, Regalado, Belkys A, MD, 12.5 mg at 03/26/19 0917 .  traZODone (DESYREL) tablet 50 mg, 50 mg, Oral, QHS, Skeet Simmer, RPH, 50 mg at 03/26/19 2100  Patients Current Diet:     Diet Order                  DIET DYS 2 Room service appropriate? Yes; Fluid consistency: Honey Thick  Diet effective now               Precautions / Restrictions Precautions Precautions: Fall Precaution Comments: trach, on 28%  Restrictions Weight Bearing Restrictions: No   Has the patient had 2 or more falls or a fall with injury in the past year?No  Prior Activity Level Limited Community (1-2x/wk): spends 1/2 the year in [place], 1/2 the year in the [place]; does not drive  Prior Functional Level Prior Function Level of Independence: Independent Comments: pt visits the [place] 7 months out of the year, was at her daughter's x 10 days when she became ill  Self Care: Did the patient need help bathing, dressing, using the toilet or eating?  Independent  Indoor Mobility: Did the patient need assistance with walking from room to room (with or without device)? Independent  Stairs: Did the patient need assistance with internal or external stairs (with or without device)? Independent  Functional Cognition: Did the patient need help planning regular tasks such as shopping or remembering to take medications? Independent  Home Assistive Devices / Equipment Home Assistive Devices/Equipment: None  Prior Device Use: Indicate devices/aids used by the patient prior to current illness, exacerbation or injury? None of the above  Current Functional Level Cognition  Overall Cognitive Status: Difficult to assess Difficult to assess due to: Tracheostomy, Non-English speaking Orientation Level: Oriented X4 General Comments: Follows commands and answers questions through interpreter.    Extremity Assessment (includes Sensation/Coordination)  Upper Extremity Assessment: Generalized weakness RUE Deficits / Details: 3/5 RUE  Coordination: decreased fine motor, decreased gross motor LUE Deficits / Details: 3/5  LUE Coordination: decreased fine motor, decreased gross motor  Lower Extremity Assessment: Generalized weakness    ADLs  Overall ADL's : Needs assistance/impaired Eating/Feeding: Sitting, Supervision/ safety Eating/Feeding Details (indicate cue type and reason): pt does not fully appreciate her swallowing precautions Grooming: Set up, Sitting Grooming Details (indicate cue type and reason): stood at sink for washing hands, sat abruptly to dry, fatigues easily Upper Body Bathing: Set up, Bed level Upper Body Bathing Details (indicate cue type and reason): for back EOB Lower Body Bathing: Moderate assistance, Sitting/lateral leans, Bed level Toilet Transfer: Moderate assistance, RW, BSC Toilet Transfer Details (indicate cue type and reason): recliner to EOB Toileting- Clothing Manipulation and Hygiene: Total assistance, Sit to/from stand Toileting - Clothing Manipulation Details (indicate cue type and reason): 3rd person helpful for rear perianal care Functional mobility during ADLs: Minimal assistance, Rolling walker General ADL Comments: fatigues very quickly    Mobility  Overal bed mobility: Needs Assistance Bed Mobility: Sit to Supine Rolling: Min assist Sidelying to sit: Mod assist Supine to sit: Mod assist, HOB elevated Sit to supine: Min assist General bed mobility comments: sitting on BSC for RN    Transfers  Overall transfer level: Needs assistance Equipment used: Rolling walker (2 wheeled) Transfers: Sit to/from Stand Sit to Stand: Min assist Stand pivot transfers: Mod assist, +2 physical assistance, +2 safety/equipment  Lateral/Scoot Transfers: Total assist General transfer comment: Assist for balance. Tactile cues for hand placement    Ambulation / Gait / Stairs / Wheelchair Mobility  Ambulation/Gait Ambulation/Gait assistance: Min Web designer (Feet): 40 Feet(x 2) Assistive device: Rolling walker (2 wheeled) Gait Pattern/deviations: Step-through  pattern, Decreased stride length, Trunk flexed General Gait Details: Assist for balance and support. Pt amb on RA with SpO2 >92%. Pt with 1 sitting rest break of 2 minutes Gait velocity: decr Gait velocity interpretation: <1.31 ft/sec, indicative of household ambulator    Posture / Balance Dynamic Sitting Balance Sitting balance - Comments: brief sitting EOB with min guard to min assist staticall y Balance Overall balance assessment: Needs assistance Sitting-balance support: Feet supported, No upper extremity supported Sitting balance-Leahy Scale: Fair Sitting balance - Comments: brief sitting EOB with min guard to min assist staticall y Postural control: Posterior lean Standing balance support: Bilateral upper extremity supported, During functional activity Standing balance-Leahy Scale: Fair Standing balance comment: walker and min guard for static standing    Special needs/care consideration BiPAP/CPAP no CPM no Continuous Drip IV no Dialysis no        Days n/a Life Vest no Oxygen 28% fiO2 5L via trach collar Special Bed no Trach Size 4 cuffless Wound Vac (area) no      Location n/a Skin ecchymosis to BUEs   Bowel mgmt: continent, last BM 03/24/2019 Bladder mgmt: continent, purewick  Diabetic mgmt: yes, insulin and oral Behavioral consideration no Chemo/radiation no     Previous Home Environment (from acute therapy documentation) Living Arrangements: Children, Other relatives  Lives With: Daughter Available Help at Discharge: Family, Available 24 hours/day Type of Home: House Home Layout: Two level, Able to live on main level with bedroom/bathroom Alternate Level Stairs-Rails: Right Alternate Level Stairs-Number of Steps: full flight Home Access: Level entry Bathroom Shower/Tub: Chiropodist: Standard Bathroom Accessibility: Yes How Accessible: Accessible via walker Home Care Services: No Additional Comments: unsure, pt is unable to report and  family is not currently present.   Discharge Living Setting Plans  for Discharge Living Setting: Lives with (comment)(daughter) Type of Home at Discharge: House Discharge Home Layout: Two level, Able to live on main level with bedroom/bathroom Alternate Level Stairs-Rails: Right Alternate Level Stairs-Number of Steps: full flight Discharge Home Access: Level entry Discharge Bathroom Shower/Tub: Tub/shower unit Discharge Bathroom Toilet: Standard Discharge Bathroom Accessibility: Yes How Accessible: Accessible via walker Does the patient have any problems obtaining your medications?: No  Social/Family/Support Systems Anticipated Caregiver: daughter, Glorious Peach Anticipated Caregiver's Contact Information: [phone] Caregiver Availability: 24/7(verified that daughter can provide 24/7 at d/c) Discharge Plan Discussed with Primary Caregiver: Yes Is Caregiver In Agreement with Plan?: Yes Does Caregiver/Family have Issues with Lodging/Transportation while Pt is in Rehab?: No   Goals/Additional Needs Patient/Family Goal for Rehab: PT/OT/SLP supervision Expected length of stay: 14-18 days Cultural Considerations: speaks Bengali Dietary Needs: dys 2/honey thick Equipment Needs: tbd Special Service Needs: Bengali interpretter; please note, per [employer], no local vendors available for Bengali interpreting.  Please use [employer] (ipad) or contact the language line at [phone].   Pt/Family Agrees to Admission and willing to participate: Yes Program Orientation Provided & Reviewed with Pt/Caregiver Including Roles  & Responsibilities: Yes  Possible need for SNF placement upon discharge: no   Patient Condition: This patient's medical and functional status has changed since the consult dated: 03/20/2019  in which the Rehabilitation Physician determined and documented that the patient's condition is appropriate for intensive rehabilitative care in an inpatient rehabilitation  facility. See "History of Present Illness" (above) for medical update. Functional changes are: pt is min assist +2 for gait, min +1 for transfers and ADLs. Patient's medical and functional status update has been discussed with the Rehabilitation physician and patient remains appropriate for inpatient rehabilitation. Will admit to inpatient rehab today.  Preadmission Screen Completed By:  Michel Santee, PT, DPT 03/27/2019 9:54 AM ______________________________________________________________________   Discussed status with Dr. Letta Pate on 03/27/19 at 9:54 AM  and received approval for admission today.  Admission Coordinator:  Michel Santee, PT, DPT time 9:54 AM Sudie Grumbling 03/27/19      Cosigned by: Charlett Blake, MD at 03/27/2019 10:28 AM  Revision History

## 2019-03-27 NOTE — Discharge Summary (Signed)
Gabrielle Wright, is a 73 y.o. female  DOB 09/05/1946  MRN [id number].  Admission date:  02/03/2019  Admitting Physician  Anders Simmonds, MD  Discharge Date:  03/27/2019   Primary MD  Rudene Anda, MD  Recommendations for primary care physician for things to follow:   1)Speech Pathologist to continue to work with patient 2)Pulmonologist to continue to work with patient with regards to her tracheostomy---Capping of tracheostomy Trial. Per pulmonologist defer decannulation beyond customary 24 hours due to prolong course but plan to decannulation prior to discharge from acute rehab  3)Use Novolog/Humalog Sliding scale insulin with Accu-Cheks/Fingersticks as ordered  Admission Diagnosis  SIRS (systemic inflammatory response syndrome) ([place]) [R65.10] AKI (acute kidney injury) ([place]) [N17.9] Septic shock ([place]) [A41.9, R65.21] Sepsis ([place]) [A41.9]   Discharge Diagnosis  SIRS (systemic inflammatory response syndrome) ([place]) [R65.10] AKI (acute kidney injury) ([place]) [N17.9] Septic shock ([place]) [A41.9, R65.21] Sepsis ([place]) [A41.9]    Principal Problem:   CAD (coronary artery disease), native coronary artery Active Problems:   Sepsis ([place])   Hypotension   Acute respiratory failure with hypoxemia ([place])   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 ([place])   Septic shock ([place])   Acute on chronic respiratory failure with hypoxia ([place])   AKI (acute kidney injury) ([place])   Abdominal distension (gaseous)   HCAP (healthcare-associated pneumonia)   Acute pulmonary edema ([place])   Tracheostomy status ([place])   Palliative care by specialist   Goals of care, counseling/discussion      Past Medical History:  Diagnosis Date   Coronary artery disease    Diabetes mellitus without complication ([place])    Hypertension     Past Surgical History:  Procedure Laterality Date    CORONARY ANGIOPLASTY WITH STENT PLACEMENT     CORONARY/GRAFT ACUTE MI REVASCULARIZATION N/A 02/09/2019   Procedure: Coronary/Graft Acute MI Revascularization;  Surgeon: Belva Crome, MD;  Location: [facility];  Service: Cardiovascular;  Laterality: N/A;   LEFT HEART CATH AND CORONARY ANGIOGRAPHY N/A 02/09/2019   Procedure: LEFT HEART CATH AND CORONARY ANGIOGRAPHY;  Surgeon: Belva Crome, MD;  Location: [facility];  Service: Cardiovascular;  Laterality: N/A;     HPI  from the history and physical done on the day of admission:   73 year old female is visiting from [place].  Noted to have a high fever 103.9.  Reported to have cough.  Taken to [facility] per family.  Was supported to have a urinary tract infection although her urine was clean.  She never filled her prescription for antibiotics.  Due to persistent hypotension and refractory nature of her hypertension she is transferred to [facility] 02/04/2019.  She was in respiratory distress for abdominal chest wall paradoxus.  She required intubation and central line placement.  She had maxed out on peripheral IV vasopressors therefore central line was placed.  Pulmonary critical care will be responsible for care at this time.  She was placed in isolation since  she came from [place] with recent reports of coronavirus and that country.  Note [place] is not on the list of infected countries.  Infectious disease was called ... 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 (HCC)   Septic shock (HCC)   Acute on chronic respiratory failure with hypoxia (HCC)   AKI  (acute kidney injury) (HCC)   Abdominal distension (gaseous)   HCAP (healthcare-associated pneumonia)   Acute pulmonary edema (HCC)   Tracheostomy status ([place])   Palliative care by specialist   Goals of care, counseling/discussion  Brief Summary:- 73 year old elderly woman visiting from [place], admitted 3/10 with fevers, hypotension, found to have group A strep bacteremia, required intubation and mechanical ventilation on 02/04/2019, extubated briefly on 02/12/2019, but Reintubated on same date due to pulmonary edema Course complicated by non-STEMI and new LV dysfunction ? Ischemic vs Takatsubo's, Failed extubation 3/18 due to acute pulmonary edema. S/p trach 02/24/19 Pulmonologist to continue to work with patient with regards to her tracheostomy---Capping of tracheostomy Trial. Per pulmonologist defer decannulation beyond customary 24 hours due to prolong course but plan to decannulation prior to discharge from acute rehab  A/p 1)Acute hypoxic respiratory failure--post tracheostomy--- respiratory failure was secondary to ischemic cardiomyopathy and pulmonary edema, currently on room air. Tolerating cuff to 4 (placed 4/24). Capping of tracheostomy Trial. Per CCM defer decannulation beyond customary 24 hours due to prolong course but plan to decannulation prior to discharge  2)HFrEF/ Ischemic cardiomyopathy acute on chronic systolic and diastolic heart failure exacerbation:NSTEMI ----EF 35 to 40% with diffuse hypokinesis of the left ventricle, LHC without target vessels for revascularization,  medical management advised.  Continue aspirin 81 mg daily, plavix, Lipitor and Coreg 3.125 twice daily, continue Lasix 40 mg daily, Aldactone 12.5 mg daily, losartan 25 mg daily, continue isosorbide 30 mg daily, hydralazine 10 mg twice daily  3)Chronic respiratory failure s/p trach ---- Placed on 3/31. Tolerating trach collar  Downsizedto #4 cuffless trach 03/21/19----Pulmonologist to continue  to work with patient with regards to her tracheostomy---Capping of tracheostomy Trial. Per pulmonologist defer decannulation beyond customary 24 hours due to prolong course but plan to decannulation prior to discharge from acute rehab   4)Transient atrial fibrillation with RVR--- resolved patient currently in sinus rhythm, cardiology recommended against anticoagulation due to drop in H&H .  Continue Coreg and amiodarone for rate control and aspirin  5)Anxiety--- continue clonazepam as needed, continue Zoloft scheduled  6)DM2--stable, continue Lantus insulin 15 units nightly along with sliding scale coverage. Use Novolog/Humalog Sliding scale insulin with Accu-Cheks/Fingersticks as ordered   7)Generalized Weakness/Debility--- continue physical and occupational therapy, awaiting transfer to CIR  Disposition/Need for in-Hospital Stay- patient unable to be discharged at this time due to awaiting bed availability for transfer to CIR  Code Status : Full  Family Communication:   na  Procedures ETT 3/10 > 3/18, 3/18 > 3/31 Trach 3/31 > CVL 3/10 > 4/3 PICC 4/1 >  Significant acute events 02/04/2019 transfer from [facility] required intubation and pressors. 3/16 off levophed 3/18 extubated briefly but reintubated due to pulmonary edema 3/19 started milrinone 3/20 Episode of respiratory distress, Precedex changed to propofol Atrial fibrillation RVR >>  amiodarone bolus >> sinus rhythm 3/21 rising creat , AF-RVR >> amio bolus 3/24:rising creatinine 3/30: reintubated 3/31 trach 4/1 PICC 4/22 on ATC 24 hours  4/24 trach downsized to 4 cuffless 4/27 capping trail started  Disposition Plan  : CIR  Consults  :  PCCM/Cardiology  Discharge Condition: improving  Follow UP--- pulmonologist  Diet and Activity recommendation:  As advised  Discharge Instructions    Discharge Instructions    Call MD for:  difficulty breathing, headache or visual  disturbances   Complete by:  As directed    Call MD for:  persistant dizziness or light-headedness   Complete by:  As directed    Call MD for:  persistant nausea and vomiting   Complete by:  As directed    Call MD for:  redness, tenderness, or signs of infection (pain, swelling, redness, odor or green/yellow discharge around incision site)   Complete by:  As directed    Call MD for:  severe uncontrolled pain   Complete by:  As directed    Call MD for:  temperature >100.4   Complete by:  As directed    Diet - low sodium heart healthy   Complete by:  As directed    Discharge instructions   Complete by:  As directed    1) speech pathologist to continue to work with patient 2) pulmonologist to continue to work with patient with regards to her tracheostomy---Capping of tracheostomy Trial. Per pulmonologist defer decannulation beyond customary 24 hours due to prolong course but plan to decannulation prior to discharge from acute rehab 3)Use Novolog/Humalog Sliding scale insulin with Accu-Cheks/Fingersticks as ordered   Increase activity slowly   Complete by:  As directed        Discharge Medications     Allergies as of 03/27/2019   No Known Allergies     Medication List    STOP taking these medications   bisoprolol 5 MG tablet Commonly known as:  ZEBETA   HumuLIN 70/30 KwikPen (70-30) 100 UNIT/ML PEN Generic drug:  Insulin Isophane & Regular Human   HumuLIN R 100 units/mL injection Generic drug:  insulin regular   linaGLIPtin-metFORMIN HCl 2.5-500 MG Tabs   NON FORMULARY   PRESCRIPTION MEDICATION   telmisartan 80 MG tablet Commonly known as:  MICARDIS     TAKE these medications   acetaminophen 325 MG tablet Commonly known as:  TYLENOL Take 2 tablets (650 mg total) by mouth every 6 (six) hours as needed for mild pain or fever.   amiodarone 200 MG tablet Commonly known as:  PACERONE Take 1 tablet (200 mg total) by mouth daily. Start taking on:  Mar 28, 2019     aspirin 81 MG chewable tablet Chew 1 tablet (81 mg total) by mouth daily. Start taking on:  Mar 28, 2019   atorvastatin 80 MG tablet Commonly known as:  LIPITOR Take 80 mg by mouth at bedtime.   Calcium 600+D3 600-400 MG-UNIT Tabs Generic drug:  Calcium Carbonate-Vitamin D3 Take 1 tablet by mouth daily.   carvedilol 3.125 MG tablet Commonly known as:  COREG Take 1 tablet (3.125 mg total) by mouth 2 (two) times daily with a meal.   clopidogrel 75 MG tablet Commonly known as:  PLAVIX Take 75 mg by mouth daily.   diphenhydrAMINE 25 mg capsule Commonly known as:  BENADRYL Take 1 capsule (25 mg total) by mouth every 6 (six) hours as needed for itching.   ferrous sulfate 325 (65 FE) MG tablet Take 325  mg by mouth daily with breakfast.   furosemide 40 MG tablet Commonly known as:  LASIX Take 40 mg by mouth daily.   guaiFENesin 100 MG/5ML Soln Commonly known as:  ROBITUSSIN Take 5 mLs (100 mg total) by mouth every 4 (four) hours as needed for cough or to loosen phlegm.   insulin glargine 100 UNIT/ML injection Commonly known as:  LANTUS Inject 0.15 mLs (15 Units total) into the skin at bedtime.   ipratropium-albuterol 0.5-2.5 (3) MG/3ML Soln Commonly known as:  DUONEB Take 3 mLs by nebulization every 4 (four) hours as needed.   isosorbide mononitrate 30 MG 24 hr tablet Commonly known as:  IMDUR Take 1 tablet (30 mg total) by mouth daily. Start taking on:  Mar 28, 2019   LORazepam 0.5 MG tablet Commonly known as:  ATIVAN Take 1 tablet (0.5 mg total) by mouth every 6 (six) hours as needed for anxiety.   losartan 25 MG tablet Commonly known as:  COZAAR Take 1 tablet (25 mg total) by mouth daily. Start taking on:  Mar 28, 2019   montelukast 10 MG tablet Commonly known as:  SINGULAIR Take 10 mg by mouth at bedtime.   multivitamin with minerals Tabs tablet Take 1 tablet by mouth daily. Start taking on:  Mar 28, 2019   pantoprazole 40 MG tablet Commonly known as:   PROTONIX Take 40 mg by mouth daily before breakfast.   pregabalin 75 MG capsule Commonly known as:  LYRICA Take 75 mg by mouth 2 (two) times daily.   sertraline 50 MG tablet Commonly known as:  ZOLOFT Take 1 tablet (50 mg total) by mouth daily. Start taking on:  Mar 28, 2019   spironolactone 25 MG tablet Commonly known as:  ALDACTONE Take 0.5 tablets (12.5 mg total) by mouth daily. Start taking on:  Mar 28, 2019 What changed:    medication strength  how much to take   traZODone 50 MG tablet Commonly known as:  DESYREL Take 1 tablet (50 mg total) by mouth at bedtime.   Uloric 40 MG tablet Generic drug:  febuxostat Take 40 mg by mouth daily.   Ventolin HFA 108 (90 Base) MCG/ACT inhaler Generic drug:  albuterol Inhale 2 puffs into the lungs every 6 (six) hours as needed for wheezing or shortness of breath.      Major procedures and Radiology Reports - PLEASE review detailed and final reports for all details, in brief -   Dg Chest 2 View  Result Date: 03/26/2019 CLINICAL DATA:  Tracheostomy.  Cough and congestion EXAM: CHEST - 2 VIEW COMPARISON:  03/21/2019 FINDINGS: Tracheostomy remains in good position. Improvement in left lower lobe airspace disease. Small left effusion. Right lung remains clear. Negative for heart failure or edema. IMPRESSION: Interval improvement left lower lobe airspace disease. Small left effusion. Electronically Signed   By: Franchot Gallo M.D.   On: 03/26/2019 08:05   Dg Abd 1 View  Result Date: 03/16/2019 CLINICAL DATA:  NG tube placement EXAM: ABDOMEN - 1 VIEW COMPARISON:  03/16/2019 FINDINGS: NG tube tip is in the distal stomach. IMPRESSION: NG tube tip in the distal stomach. Electronically Signed   By: Rolm Baptise M.D.   On: 03/16/2019 23:27   Dg Abd 1 View  Result Date: 03/16/2019 CLINICAL DATA:  NG tube placement EXAM: ABDOMEN - 1 VIEW COMPARISON:  Earlier same day FINDINGS: 1737 hours. Feeding tube no longer evident. NG tube tip is in the  mid to distal stomach. Nonspecific bowel gas pattern  within the visualized left abdomen. IMPRESSION: NG tube tip is in the mid to distal stomach. Electronically Signed   By: Misty Stanley M.D.   On: 03/16/2019 17:48   Dg Abd 1 View  Result Date: 03/16/2019 CLINICAL DATA:  Feeding tube placement. EXAM: ABDOMEN - 1 VIEW COMPARISON:  None. FINDINGS: A small bore feeding tube is identified with tip overlying the distal stomach. The visualized portions of the feeding tube are unremarkable. IMPRESSION: Small bore feeding tube with tip overlying the distal stomach. No abnormalities of the visualized feeding tube identified. Electronically Signed   By: Margarette Canada M.D.   On: 03/16/2019 14:22   Dg Abd 1 View  Result Date: 03/06/2019 CLINICAL DATA:  Ileus EXAM: ABDOMEN - 1 VIEW COMPARISON:  Yesterday FINDINGS: Feeding tube tip is at the pylorus. Gastric suction tube is at the stomach body. Haziness of the lower chest from pulmonary opacity and layering pleural effusions. The visualized bowel gas pattern is normal. Arterial calcification. IMPRESSION: Feeding tube tip at the pylorus and gastric suction tube tip at the gastric body-stable from yesterday Electronically Signed   By: Monte Fantasia M.D.   On: 03/06/2019 05:21   Dg Chest Port 1 View  Result Date: 03/21/2019 CLINICAL DATA:  73 year old female with shortness of breath. Trach collar. EXAM: PORTABLE CHEST 1 VIEW COMPARISON:  03/17/2019 and earlier. FINDINGS: Portable AP upright view at 1025 hours. Stable tracheostomy. Right PICC line has been removed. Enteric tube has been removed. Continued low lung volumes. Regressed pulmonary interstitial opacity/vascular congestion since 03/17/2019, but continued hypo ventilation at the left lung base with patchy opacity. Stable cardiac size and mediastinal contours. No pneumothorax. Paucity of bowel gas in the upper abdomen. IMPRESSION: 1. Regressed pulmonary vascular congestion but continued low lung volumes with left  lung base atelectasis or consolidation. 2. Enteric tube removed. Right PICC line removed. Electronically Signed   By: Genevie Ann M.D.   On: 03/21/2019 10:39   Dg Chest Port 1 View  Result Date: 03/17/2019 CLINICAL DATA:  Shortness of breath and congestion EXAM: PORTABLE CHEST 1 VIEW COMPARISON:  March 15, 2019 FINDINGS: Tracheostomy catheter tip is 1.5 cm above the carina. Nasogastric tube tip and side port are below the diaphragm. Central catheter tip is in the right atrium slightly distal to the superior vena cava-right atrium junction. No pneumothorax. There is cardiomegaly with pulmonary vascularity grossly normal. There is a left pleural effusion with left lower lobe consolidation. There is a smaller right pleural effusion. There is a slight degree of interstitial edema. IMPRESSION: Tube and catheter positions as described without pneumothorax. Stable cardiac prominence. Consolidation with left pleural effusion on the left. Suspect pneumonia, although alveolar edema could present in this manner. There is a smaller right pleural effusion. There is mild interstitial edema. A degree of superimposed congestive heart failure must be of concern. Electronically Signed   By: Lowella Grip III M.D.   On: 03/17/2019 10:02   Dg Chest Port 1 View  Result Date: 03/15/2019 CLINICAL DATA:  73 year old female with chronic respiratory failure EXAM: PORTABLE CHEST 1 VIEW COMPARISON:  Prior chest x-ray 03/14/2019 FINDINGS: The tracheostomy tube remains in good position. The tip is midline and at the level of the clavicles. A feeding tube is present, the tip is not identified but lies off the field of view below the diaphragm and likely within the stomach or small bowel. A right upper extremity PICC is present. The catheter tip overlies the upper right atrium. Stable cardiomegaly. Atherosclerotic  calcifications again noted in the transverse aorta. Pulmonary vascular congestion with diffuse mild interstitial prominence and  peripheral Kerley B-lines consistent with mild interstitial edema. These findings have progressed compared to yesterday. Patchy left basilar airspace opacity partially obscures the hemidiaphragm. No pneumothorax. No acute osseous abnormality. IMPRESSION: 1. Slightly increased interstitial pulmonary edema. 2. Persistent left retrocardiac airspace opacity which may reflect atelectasis and/or infiltrate. 3. Support apparatus in stable and satisfactory position. Electronically Signed   By: Jacqulynn Cadet M.D.   On: 03/15/2019 09:16   Dg Chest Port 1 View  Result Date: 03/14/2019 CLINICAL DATA:  Respiratory failure. EXAM: PORTABLE CHEST 1 VIEW COMPARISON:  One-view chest x-ray 03/11/2019 FINDINGS: Heart size is exaggerated by low lung volumes. Atherosclerotic calcifications are present at the aortic arch. Tracheostomy tube is stable. Right-sided PICC line is in satisfactory position. A feeding tube courses off the inferior border of the film. Left greater than right basilar airspace disease is stable. There is slight increase in mild pulmonary vascular congestion. IMPRESSION: 1. Left greater than right basilar airspace disease is similar the prior study. While this may represent atelectasis, infection is not excluded. 2. Slight increase in pulmonary vascular congestion. 3. Support apparatus is stable. Electronically Signed   By: San Morelle M.D.   On: 03/14/2019 08:15   Dg Chest Port 1 View  Result Date: 03/11/2019 CLINICAL DATA:  Shortness of breath EXAM: PORTABLE CHEST 1 VIEW COMPARISON:  March 06, 2019 FINDINGS: Tracheostomy catheter tip is 4.4 cm above the carina. Central catheter tip is in the right atrium slightly beyond the superior vena cava-right atrium junction. Feeding tube tip is below the diaphragm with tip not seen. No pneumothorax. There is consolidation in the left base with small left pleural effusion. There is mild right base atelectasis. Heart is mildly enlarged with pulmonary  vascularity normal. There is aortic atherosclerosis. No adenopathy. No bone lesions. IMPRESSION: Tube and catheter positions as described without pneumothorax. Consolidation in a portion of the left lower lobe, likely pneumonia, with small left pleural effusion. Mild right base atelectasis. Stable cardiac silhouette.  Aortic Atherosclerosis (ICD10-I70.0). Electronically Signed   By: Lowella Grip III M.D.   On: 03/11/2019 10:19   Dg Chest Port 1 View  Result Date: 03/06/2019 CLINICAL DATA:  Pulmonary edema EXAM: PORTABLE CHEST 1 VIEW COMPARISON:  Three days ago FINDINGS: Haziness of the bilateral chest with layering pleural effusions. There is also lung opacity which could be infection or edema. Tracheostomy tube in place. The enteric tubes reache the stomach. Right PICC with tip at the upper cavoatrial junction. No pneumothorax. IMPRESSION: 1. Unremarkable hardware positioning. 2. Unchanged layering pleural effusions and pulmonary opacification. Electronically Signed   By: Monte Fantasia M.D.   On: 03/06/2019 05:37   Dg Chest Port 1 View  Result Date: 03/03/2019 CLINICAL DATA:  Chest pain EXAM: PORTABLE CHEST 1 VIEW COMPARISON:  02/27/2019 FINDINGS: Tracheostomy in good position. Feeding tube enters the stomach with the tip not visualized. Left-sided central line removed. Right-sided PICC tip in the mid right atrium. Mild improvement in diffuse bilateral airspace disease. Small bilateral effusions and bibasilar atelectasis improved. IMPRESSION: Interval improvement in bilateral airspace disease most likely pulmonary edema. Improvement in bibasilar atelectasis and effusion Right arm PICC tip advanced into the right atrium. Electronically Signed   By: Franchot Gallo M.D.   On: 03/03/2019 19:48   Dg Chest Port 1 View  Result Date: 02/27/2019 CLINICAL DATA:  Ventilator, tracheostomy EXAM: PORTABLE CHEST 1 VIEW COMPARISON:  02/25/2019 FINDINGS: Tracheostomy  and left central line are unchanged. Interval  placement of right PICC line with the tip in the SVC. NG tube enters the stomach. Cardiomegaly. Layering bilateral effusions with bilateral airspace disease, slightly worsening since prior study. No acute bony abnormality. IMPRESSION: Moderate layering bilateral effusions. Worsening diffuse bilateral airspace disease which could reflect edema or infection. Electronically Signed   By: Rolm Baptise M.D.   On: 02/27/2019 07:55   Portable Chest X-ray  Result Date: 02/25/2019 CLINICAL DATA:  Tracheostomy tube, hypertension, diabetes mellitus EXAM: PORTABLE CHEST 1 VIEW COMPARISON:  Portable exam 1612 hours compared to 02/25/2019 FINDINGS: Olivia Mackie ostomy tube projects over tracheal air column. LEFT subclavian central venous catheter tip projects over RIGHT atrium. Nasogastric tube extends into stomach. Enlargement of cardiac silhouette post coronary stenting. Atherosclerotic calcification aorta. Bibasilar atelectasis versus consolidation. Upper lungs clear. Component of small RIGHT pleural effusion is present. No pneumothorax. IMPRESSION: Bibasilar atelectasis versus consolidation with small RIGHT pleural effusion. Electronically Signed   By: Lavonia Dana M.D.   On: 02/25/2019 16:27   Dg Abd Portable 1v  Result Date: 03/18/2019 CLINICAL DATA:  Feeding tube placed EXAM: PORTABLE ABDOMEN - 1 VIEW COMPARISON:  None. FINDINGS: Feeding tube with the tip projecting over the antrum of the stomach. There is no bowel dilatation to suggest obstruction. There is no evidence of pneumoperitoneum, portal venous gas or pneumatosis. There are no pathologic calcifications along the expected course of the ureters. The osseous structures are unremarkable. IMPRESSION: Feeding tube with the tip projecting over the antrum of the stomach. Electronically Signed   By: Kathreen Devoid   On: 03/18/2019 12:48   Dg Abd Portable 1v  Result Date: 03/05/2019 CLINICAL DATA:  NG tube placement EXAM: PORTABLE ABDOMEN - 1 VIEW COMPARISON:  02/24/2019  FINDINGS: NG tube is in the mid stomach. Feeding tube has been placed with the tip in the distal stomach or proximal duodenum. Nonobstructive bowel gas pattern. Diffuse airspace disease throughout the lungs with layering effusions. IMPRESSION: NG tube remains in the mid stomach. Feeding tube tip is in the peri pyloric region. Electronically Signed   By: Rolm Baptise M.D.   On: 03/05/2019 11:15   Dg Swallowing Func-speech Pathology  Result Date: 03/19/2019 Objective Swallowing Evaluation: Type of Study: MBS-Modified Barium Swallow Study  Patient Details Name: Shanzay Hepworth MRN: [id number] Date of Birth: [birth year] Today's Date: 03/19/2019 Time: SLP Start Time (ACUTE ONLY): 64 -SLP Stop Time (ACUTE ONLY): 1050 SLP Time Calculation (min) (ACUTE ONLY): 20 min Past Medical History: Past Medical History: Diagnosis Date  Coronary artery disease   Diabetes mellitus without complication ([place])   Hypertension  Past Surgical History: Past Surgical History: Procedure Laterality Date  CORONARY ANGIOPLASTY WITH STENT PLACEMENT    CORONARY/GRAFT ACUTE MI REVASCULARIZATION N/A 02/09/2019  Procedure: Coronary/Graft Acute MI Revascularization;  Surgeon: Belva Crome, MD;  Location: [facility];  Service: Cardiovascular;  Laterality: N/A;  LEFT HEART CATH AND CORONARY ANGIOGRAPHY N/A 02/09/2019  Procedure: LEFT HEART CATH AND CORONARY ANGIOGRAPHY;  Surgeon: Belva Crome, MD;  Location: [facility];  Service: Cardiovascular;  Laterality: N/A; HPI: 73 y.o. female admitted on 02/03/19 for fever and cough (to [facility]) and was transferred to [facility] on 02/04/19 for hypotension and respiratory distress (acute respiratory failure/pulmonary edema) requiring intubation 3/10.  She was visiting family from [place] and [place] 19 tests were negative.  She was found to have group A strep bacteremia (septic shiock), rhinovirus,  and course complicated by NSTEMI and  new LV dysfunction s/p cardiac cath on 02/09/19.   Other dx include ischemic cardiomyopathy, hyperkalemia, DM2- uncontrolled, and abdominal distention.  Pt with significant PMH of DM, HTN, anc CAD. Trach placed on 3/31  Subjective: pt alert, anxious Assessment / Plan / Recommendation CHL IP CLINICAL IMPRESSIONS 03/19/2019 Clinical Impression Pt has a mild oropharyngeal dysphagia felt to be secondary to prolonged and repeated intubations and overall deconditioning. She has reduced lingual manipulation and slow, incomplete oral clearance, with lingual residue increasing as boluses become more solid. Base of tongue retraction, hyolaryngeal movement, and epiglottic inversion are all reduced. This results in decreased laryngeal vestibule closure, and pt has aspiration before and during the swallow with thin and nectar thick liquids that cannot be cleared fully given intermittent spontaneous coughing. Aforementioned weakness also results in trace vallecular residue across consistencies but also with the barium tablet remaining lodged in the valleculae, requiring multiple pureed and honey thick liquid boluses to clear. Considering the above as well as her mentation and language barrier, limited compensatory strategies were attempted (although also not found to be effective). Recommend starting Dys 2 diet and honey thick liquids with PMV in place and full supervision provided. Will f/u for tolerance and hopeful improvement given additional therapy and time post-extubation. SLP Visit Diagnosis Dysphagia, oropharyngeal phase (R13.12) Attention and concentration deficit following -- Frontal lobe and executive function deficit following -- Impact on safety and function Moderate aspiration risk;Mild aspiration risk   CH[facility] IP TREATMENT RECOMMENDATION 03/19/2019 Treatment Recommendations Therapy as outlined in treatment plan below   Prognosis 03/19/2019 Prognosis for Safe Diet Advancement Good Barriers to Reach Goals -- Barriers/Prognosis Comment -- [facility] IP DIET RECOMMENDATION  03/19/2019 SLP Diet Recommendations Dysphagia 2 (Fine chop) solids;Honey thick liquids Liquid Administration via Cup;Straw Medication Administration Crushed with puree Compensations Slow rate;Small sips/bites Postural Changes Seated upright at 90 degrees   CHL IP OTHER RECOMMENDATIONS 03/19/2019 Recommended Consults -- Oral Care Recommendations Oral care BID Other Recommendations Order thickener from pharmacy;Prohibited food (jello, ice cream, thin soups);Remove water pitcher;Place PMSV during PO intake   CHL IP FOLLOW UP RECOMMENDATIONS 03/19/2019 Follow up Recommendations Skilled Nursing facility   [facility] IP FREQUENCY AND DURATION 03/19/2019 Speech Therapy Frequency (ACUTE ONLY) min 2x/week Treatment Duration 2 weeks      CHL IP ORAL PHASE 03/19/2019 Oral Phase Impaired Oral - Pudding Teaspoon -- Oral - Pudding Cup -- Oral - Honey Teaspoon Reduced posterior propulsion;Lingual/palatal residue Oral - Honey Cup Reduced posterior propulsion;Lingual/palatal residue Oral - Nectar Teaspoon Reduced posterior propulsion;Lingual/palatal residue Oral - Nectar Cup -- Oral - Nectar Straw -- Oral - Thin Teaspoon Premature spillage Oral - Thin Cup -- Oral - Thin Straw -- Oral - Puree Reduced posterior propulsion;Lingual/palatal residue Oral - Mech Soft Reduced posterior propulsion;Lingual/palatal residue;Impaired mastication Oral - Regular -- Oral - Multi-Consistency -- Oral - Pill Delayed oral transit Oral Phase - Comment --  CHL IP PHARYNGEAL PHASE 03/19/2019 Pharyngeal Phase Impaired Pharyngeal- Pudding Teaspoon -- Pharyngeal -- Pharyngeal- Pudding Cup -- Pharyngeal -- Pharyngeal- Honey Teaspoon Reduced epiglottic inversion;Reduced anterior laryngeal mobility;Reduced laryngeal elevation;Reduced airway/laryngeal closure;Reduced tongue base retraction;Pharyngeal residue - valleculae Pharyngeal -- Pharyngeal- Honey Cup Reduced epiglottic inversion;Reduced anterior laryngeal mobility;Reduced laryngeal elevation;Reduced airway/laryngeal  closure;Reduced tongue base retraction;Pharyngeal residue - valleculae Pharyngeal -- Pharyngeal- Nectar Teaspoon Reduced epiglottic inversion;Reduced anterior laryngeal mobility;Reduced laryngeal elevation;Reduced airway/laryngeal closure;Reduced tongue base retraction;Penetration/Aspiration before swallow;Pharyngeal residue - valleculae Pharyngeal Material enters airway, passes BELOW cords and not ejected out despite cough attempt by patient Pharyngeal- Nectar Cup -- Pharyngeal -- Pharyngeal-  Nectar Straw -- Pharyngeal -- Pharyngeal- Thin Teaspoon Reduced epiglottic inversion;Reduced anterior laryngeal mobility;Reduced laryngeal elevation;Reduced airway/laryngeal closure;Reduced tongue base retraction;Penetration/Aspiration during swallow Pharyngeal Material enters airway, passes BELOW cords and not ejected out despite cough attempt by patient Pharyngeal- Thin Cup -- Pharyngeal -- Pharyngeal- Thin Straw -- Pharyngeal -- Pharyngeal- Puree Reduced epiglottic inversion;Reduced anterior laryngeal mobility;Reduced laryngeal elevation;Reduced airway/laryngeal closure;Reduced tongue base retraction;Pharyngeal residue - valleculae Pharyngeal -- Pharyngeal- Mechanical Soft Reduced epiglottic inversion;Reduced anterior laryngeal mobility;Reduced laryngeal elevation;Reduced airway/laryngeal closure;Reduced tongue base retraction;Pharyngeal residue - valleculae Pharyngeal -- Pharyngeal- Regular -- Pharyngeal -- Pharyngeal- Multi-consistency -- Pharyngeal -- Pharyngeal- Pill Reduced epiglottic inversion;Reduced anterior laryngeal mobility;Reduced laryngeal elevation;Reduced airway/laryngeal closure;Reduced tongue base retraction;Pharyngeal residue - valleculae Pharyngeal -- Pharyngeal Comment --  CHL IP CERVICAL ESOPHAGEAL PHASE 03/19/2019 Cervical Esophageal Phase WFL Pudding Teaspoon -- Pudding Cup -- Honey Teaspoon -- Honey Cup -- Nectar Teaspoon -- Nectar Cup -- Nectar Straw -- Thin Teaspoon -- Thin Cup -- Thin Straw --  Puree -- Mechanical Soft -- Regular -- Multi-consistency -- Pill -- Cervical Esophageal Comment -- Venita Sheffield Nix 03/19/2019, 5:19 PM  Pollyann Glen, M.A. CCC-SLP Acute Rehabilitation Services Pager [phone] Office [phone]             Ir Picc Placement Right >5 Yrs Inc Img Guide  Result Date: 02/26/2019 INDICATION: Poor venous access. In need of durable intravenous access for medication administration and blood draws. EXAM: ULTRASOUND AND FLUOROSCOPIC GUIDED PICC LINE INSERTION MEDICATIONS: None. CONTRAST:  None FLUOROSCOPY TIME:  36 seconds (1.2 mGy) COMPLICATIONS: None immediate. TECHNIQUE: The procedure, risks, benefits, and alternatives were explained to the patient and informed written consent was obtained. A timeout was performed prior to the initiation of the procedure. The right upper extremity was prepped with chlorhexidine in a sterile fashion, and a sterile drape was applied covering the operative field. Maximum barrier sterile technique with sterile gowns and gloves were used for the procedure. A timeout was performed prior to the initiation of the procedure. Local anesthesia was provided with 1% lidocaine. Under direct ultrasound guidance, the basilic vein was accessed with a micropuncture kit after the overlying soft tissues were anesthetized with 1% lidocaine. After the overlying soft tissues were anesthetized, a small venotomy incision was created and a micropuncture kit was utilized to access the right basilic vein. Real-time ultrasound guidance was utilized for vascular access including the acquisition of a permanent ultrasound image documenting patency of the accessed vessel. A guidewire was advanced to the level of the superior caval-atrial junction for measurement purposes and the PICC line was cut to length. A peel-away sheath was placed and a 40 cm, 5 [place], dual lumen was inserted to level of the superior caval-atrial junction. A post procedure spot fluoroscopic was obtained. The  catheter easily aspirated and flushed and was sutured in place. A dressing was placed. The patient tolerated the procedure well without immediate post procedural complication. FINDINGS: After catheter placement, the tip lies within the superior cavoatrial junction. The catheter aspirates and flushes normally and is ready for immediate use. IMPRESSION: Successful ultrasound and fluoroscopic guided placement of a right basilic vein approach, 40 cm, 5 French, dual lumen PICC with tip at the superior caval-atrial junction. The PICC line is ready for immediate use. Electronically Signed   By: Sandi Mariscal M.D.   On: 02/26/2019 14:36   Today   Subjective    Safiya Gaines today has no new complaints,      IPad interpreter used   Patient has been seen and  examined prior to discharge   Objective   Blood pressure (!) 120/48, pulse 86, temperature 97.9 F (36.6 C), temperature source Oral, resp. rate 14, height _0  (1.676 m), weight 74.8 kg, SpO2 100 %.   Intake/Output Summary (Last 24 hours) at 03/27/2019 1211 Last data filed at 03/27/2019 0800 Gross per 24 hour  Intake 440 ml  Output 170 ml  Net 270 ml    Exam Gen:- Awake Alert,  Obese, in no acute distress HEENT:- [place].AT, No sclera icterus Neck-  tracheostomy  lungs-  CTAB , fair symmetrical air movement CV- S1, S2 normal, regular  Abd-  +ve B.Sounds, Abd Soft, No tenderness,    Extremity/Skin:- , pedal pulses present  Psych-affect is appropriate, oriented x3 Neuro-generalized weakness, but no new focal deficits, no tremors   Data Review   CBC w Diff:  Lab Results  Component Value Date   WBC 7.7 03/26/2019   HGB 8.7 (L) 03/26/2019   HCT 29.4 (L) 03/26/2019   PLT 279 03/26/2019   LYMPHOPCT 31 03/24/2019   MONOPCT 10 03/24/2019   EOSPCT 1 03/24/2019   BASOPCT 0 03/24/2019    CMP:  Lab Results  Component Value Date   NA 140 03/26/2019   K 3.8 03/26/2019   CL 97 (L) 03/26/2019   CO2 29 03/26/2019   BUN 19 03/26/2019    CREATININE 1.18 (H) 03/26/2019   PROT 6.3 (L) 02/28/2019   ALBUMIN 2.4 (L) 03/12/2019   BILITOT 0.5 02/28/2019   ALKPHOS 53 02/28/2019   AST 22 02/28/2019   ALT 11 02/28/2019  . Total Discharge time is about 33 minutes  Roxan Hockey M.D on 03/27/2019 at 12:11 PM  Go to [email or web address] -  for contact info  [facility] - Office  [phone]

## 2019-03-27 NOTE — Progress Notes (Signed)
Pt arrived to unit. Transferred from w/c to bed. x2 nurse completed skin assessment. Introduced to staff, surrounding, call bell system, and safety measures. Reconnected trach collar to humidified air. Passy muir valve is place. Brantley Persons interpreter to complete admission questions. Bed in lowest position, fall mats each side of bed, call bell in reach, offering toileting. Unable to reach daughter to update on transfer. Will try again.

## 2019-03-27 NOTE — Progress Notes (Signed)
Gabrielle Colace, MD  Physician  Physical Medicine and Rehabilitation  Consult Note  Signed  Date of Service:  03/20/2019 12:55 PM       Related encounter: ED to Hosp-Admission (Current) from 02/03/2019 in [place] [facility] Progressive [facility]      Signed      Expand All Collapse All    Show:Clear all Manual[x] Template[] Copied  Added by: Angiulli, Mcarthur Rossetti, PA-C[x] Kirsteins, Victorino Sparrow, MD  Hover for details      Physical Medicine and Rehabilitation Consult Reason for Consult: Decreased functional mobility Referring Physician: [facility]   HPI: Gabrielle Wright is a 73 y.o.right handed non-English-speaking female with history of CAD with STEMI/stenting 2018 maintained on Plavix, diabetes mellitus,CKD stage III, hypertension. Per chart review patient is from [place] and was visiting family. Reportedly independent prior to admission. Result 02/03/2019 with fever as well as cough 2 [facility] and was transferred to [facility] 02/04/2019 for hypertension respiratory distress requiring intubation Patient was placed on IV pressors.. Covid 19 test negative. She was found to have group A strep bacteremia with septic shock. 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 Underwent cardiac catheterization 02/09/2019 per Dr. Verdis Prime. Showing 20-30% diffuse Nehring within the proximal LAD stent and diffuse narrowing in the distal LAD apical segment.patient's stent in the mid to distal obtuse marginal with 80-90% stenosis distal to the stent with active thrombus noted and currently maintained on aspirin. Bouts of atrial fibrillation with RVR remained on aspirin therapy.Due to ongoing respiratory failure with hypoxemia tracheostomy tube was placed 02/24/2019 per Dr. Molli Knock. Patient is currently on a dysphagia #2 honey thick liquid diet and speech therapy follow-up  evaluating swallow as well as possible need for gastrostomy tube for nutritional support. Acute on chronic anemia 9.0 and monitored. Therapy evaluations have been completed. M.D. has requested physical medicine rehabilitation consult.  Used Bengali interpreter via iPad, because of tracheostomy and low vocal volume it was difficult to interpret clearly at times  Review of Systems  Unable to perform ROS: Language       Past Medical History:  Diagnosis Date   Coronary artery disease    Diabetes mellitus without complication (HCC)    Hypertension         Past Surgical History:  Procedure Laterality Date   CORONARY ANGIOPLASTY WITH STENT PLACEMENT     CORONARY/GRAFT ACUTE MI REVASCULARIZATION N/A 02/09/2019   Procedure: Coronary/Graft Acute MI Revascularization;  Surgeon: Lyn Records, MD;  Location: [facility] INVASIVE CV LAB;  Service: Cardiovascular;  Laterality: N/A;   LEFT HEART CATH AND CORONARY ANGIOGRAPHY N/A 02/09/2019   Procedure: LEFT HEART CATH AND CORONARY ANGIOGRAPHY;  Surgeon: Lyn Records, MD;  Location: [facility] INVASIVE CV LAB;  Service: Cardiovascular;  Laterality: N/A;   History reviewed. No pertinent family history. Social History:  reports that she has never smoked. She has never used smokeless tobacco. She reports that she does not drink alcohol or use drugs. Allergies: No Known Allergies       Medications Prior to Admission  Medication Sig Dispense Refill   albuterol (VENTOLIN HFA) 108 (90 Base) MCG/ACT inhaler Inhale 2 puffs into the lungs every 6 (six) hours as needed for wheezing or shortness of breath.     atorvastatin (LIPITOR) 80 MG tablet Take 80 mg by mouth at bedtime.     bisoprolol (ZEBETA) 5 MG tablet Take 5 mg by mouth daily.  Calcium Carbonate-Vitamin D3 (CALCIUM 600+D3) 600-400 MG-UNIT TABS Take 1 tablet by mouth daily.     clopidogrel (PLAVIX) 75 MG tablet Take 75 mg by mouth daily.     febuxostat (ULORIC) 40 MG tablet Take  40 mg by mouth daily.     ferrous sulfate 325 (65 FE) MG tablet Take 325 mg by mouth daily with breakfast.     furosemide (LASIX) 40 MG tablet Take 40 mg by mouth daily.     Insulin Isophane & Regular Human (HUMULIN 70/30 KWIKPEN) (70-30) 100 UNIT/ML PEN Inject 28-30 Units into the skin 2 (two) times daily.     insulin regular (HUMULIN R) 100 units/mL injection Inject 10 Units into the skin daily with lunch.     linaGLIPtin-metFORMIN HCl 2.5-500 MG TABS Take 1 tablet by mouth daily.     montelukast (SINGULAIR) 10 MG tablet Take 10 mg by mouth at bedtime.     NON FORMULARY Take 1 tablet by mouth See admin instructions. NEVRALIP 600 RETARD- (Nevralip 600 retard is a Food Supplement containing Lipoic Acid, Chromium Picolinate, Vitamins and Minerals. Selenium, Zinc and Vitamin E contribute to the protection of cells from oxidative stress): Take 1 tablet by mouth once a day or as otherwise directed     pantoprazole (PROTONIX) 40 MG tablet Take 40 mg by mouth daily before breakfast.     pregabalin (LYRICA) 75 MG capsule Take 75 mg by mouth 2 (two) times daily.     PRESCRIPTION MEDICATION Take 16 mg by mouth See admin instructions. BETAHISTINE DIHYDROCHLORIDE 16 MG TABLETS: Take 16 mg by mouth daily or as otherwise directed     spironolactone (ALDACTONE) 50 MG tablet Take 50 mg by mouth daily.     telmisartan (MICARDIS) 80 MG tablet Take 80 mg by mouth daily.      Home: Home Living Family/patient expects to be discharged to:: Private residence Living Arrangements: Children, Other relatives Available Help at Discharge: Family, Available 24 hours/day Additional Comments: unsure, pt is unable to report and family is not currently present.   Functional History: Prior Function Level of Independence: Independent Comments: pt visits the [place] 7 months out of the year, was at her daughter's x 10 days when she became ill Functional Status:  Mobility: Bed  Mobility Overal bed mobility: Needs Assistance Bed Mobility: Sit to Supine Rolling: Mod assist Sidelying to sit: Mod assist Supine to sit: Mod assist, HOB elevated Sit to supine: +2 for physical assistance, Total assist General bed mobility comments: pt up in chair upon PT arrival Transfers Overall transfer level: Needs assistance Equipment used: Rolling walker (2 wheeled) Transfers: Sit to/from Stand Sit to Stand: Mod assist Stand pivot transfers: Mod assist, +2 physical assistance, +2 safety/equipment  Lateral/Scoot Transfers: Total assist General transfer comment: v/c's to push up from arm rests not pull up on walker, completed 4 sit to stands Ambulation/Gait Ambulation/Gait assistance: Min assist, +2 safety/equipment Gait Distance (Feet): 12 Feet(x3) Assistive device: Rolling walker (2 wheeled) Gait Pattern/deviations: Step-through pattern, Decreased stride length General Gait Details: pt with inc SOB and onset of fatigue limited amb tolerance however much improved from monday's session. Pt completed 3 bouts of 12 feet with max encouragement. Pt SpO2 >90 while on RA. took 2 min rest breaks in between bouts Gait velocity: dec Gait velocity interpretation: <1.31 ft/sec, indicative of household ambulator  ADL: ADL Overall ADL's : Needs assistance/impaired Eating/Feeding: NPO Grooming: Oral care, Sitting, Supervision/safety Grooming Details (indicate cue type and reason): suction swab for oral care with  setup assistance, while seated supported  Upper Body Bathing: Maximal assistance, Sitting Upper Body Bathing Details (indicate cue type and reason): for back EOB Toilet Transfer: +2 for physical assistance, +2 for safety/equipment, Stand-pivot, Moderate assistance Toilet Transfer Details (indicate cue type and reason): recliner to EOB Toileting- Clothing Manipulation and Hygiene: Maximal assistance, +2 for physical assistance, +2 for safety/equipment, Sit to/from stand Toileting -  Clothing Manipulation Details (indicate cue type and reason): 3rd person helpful for rear perianal care Functional mobility during ADLs: Maximal assistance, +2 for physical assistance, +2 for safety/equipment General ADL Comments: fatigues very quickly  Cognition: Cognition Overall Cognitive Status: Difficult to assess Orientation Level: Intubated/Tracheostomy - Unable to assess Cognition Arousal/Alertness: Awake/alert Behavior During Therapy: WFL for tasks assessed/performed Overall Cognitive Status: Difficult to assess General Comments: pt perseverating on asking for something to drink despite saying no/shaking head no Difficult to assess due to: Tracheostomy, Non-English speaking  Blood pressure 117/60, pulse 81, temperature 98.6 F (37 C), temperature source Oral, resp. rate 12, height  (1.676 m), weight 73.4 kg, SpO2 100 %. Physical Exam  Nursing note and vitals reviewed. Constitutional: She appears well-developed and well-nourished. No distress.  HENT:  Head: Normocephalic and atraumatic.  Eyes: Pupils are equal, round, and reactive to light. Conjunctivae and EOM are normal.  Cardiovascular: Normal rate. An irregularly irregular rhythm present.  No murmur heard. Respiratory: Effort normal. No respiratory distress. She has wheezes.  GI: Soft. Bowel sounds are normal. She exhibits no distension. There is no abdominal tenderness.  Genitourinary:    Genitourinary Comments: Peri-wick   Musculoskeletal:     Comments: No pain with upper limb or lower limb range of motion  Neurological: She is alert.  Patient sitting up in bed. Non-English-speaking. Followed limited motor commands. Tracheostomy tube was in place overall exam was limited due to language barrier and tracheostomy tube  The patient is oriented to [facility], hospital but not [facility] or [employer].  The patient is oriented to 2020 but not to month.  She states she entered the hospital in February  Strength  is 4/5 bilateral deltoid, bicep, tricep, grip, hip flexor, knee extensor, ankle dorsiflexor  Skin: Skin is warm and dry. She is not diaphoretic.  Psychiatric: She has a normal mood and affect.    LabResultsLast24Hours  Results for orders placed or performed during the hospital encounter of 02/03/19 (from the past 24 hour(s))  Glucose, capillary     Status: Abnormal   Collection Time: 03/19/19  3:51 PM  Result Value Ref Range   Glucose-Capillary 192 (H) 70 - 99 mg/dL  Glucose, capillary     Status: Abnormal   Collection Time: 03/19/19  7:42 PM  Result Value Ref Range   Glucose-Capillary 227 (H) 70 - 99 mg/dL  Glucose, capillary     Status: Abnormal   Collection Time: 03/20/19 12:12 AM  Result Value Ref Range   Glucose-Capillary 243 (H) 70 - 99 mg/dL  Glucose, capillary     Status: Abnormal   Collection Time: 03/20/19  3:40 AM  Result Value Ref Range   Glucose-Capillary 135 (H) 70 - 99 mg/dL  CBC     Status: Abnormal   Collection Time: 03/20/19  7:36 AM  Result Value Ref Range   WBC 11.0 (H) 4.0 - 10.5 K/uL   RBC 3.46 (L) 3.87 - 5.11 MIL/uL   Hemoglobin 9.0 (L) 12.0 - 15.0 g/dL   HCT 82.9 (L) 56.2 - 13.0 %   MCV 89.0 80.0 - 100.0 fL  MCH 26.0 26.0 - 34.0 pg   MCHC 29.2 (L) 30.0 - 36.0 g/dL   RDW 74.7 (H) 34.0 - 37.0 %   Platelets 270 150 - 400 K/uL   nRBC 0.0 0.0 - 0.2 %  Basic metabolic panel     Status: Abnormal   Collection Time: 03/20/19  7:36 AM  Result Value Ref Range   Sodium 147 (H) 135 - 145 mmol/L   Potassium 4.3 3.5 - 5.1 mmol/L   Chloride 105 98 - 111 mmol/L   CO2 31 22 - 32 mmol/L   Glucose, Bld 81 70 - 99 mg/dL   BUN 52 (H) 8 - 23 mg/dL   Creatinine, Ser 9.64 (H) 0.44 - 1.00 mg/dL   Calcium 9.1 8.9 - 38.3 mg/dL   GFR calc non Af Amer 42 (L) >60 mL/min   GFR calc Af Amer 48 (L) >60 mL/min   Anion gap 11 5 - 15  Glucose, capillary     Status: None   Collection Time: 03/20/19  8:53 AM  Result Value Ref Range    Glucose-Capillary 88 70 - 99 mg/dL  Glucose, capillary     Status: Abnormal   Collection Time: 03/20/19 12:11 PM  Result Value Ref Range   Glucose-Capillary 170 (H) 70 - 99 mg/dL   Comment 1 Notify RN    Comment 2 Document in Chart       ImagingResults(Last48hours)  Dg Swallowing Func-speech Pathology  Result Date: 03/19/2019 Objective Swallowing Evaluation: Type of Study: MBS-Modified Barium Swallow Study  Patient Details Name: Radley Abramson MRN: [id number] Date of Birth: 20-Nov-1946 Today's Date: 03/19/2019 Time: SLP Start Time (ACUTE ONLY): 1030 -SLP Stop Time (ACUTE ONLY): 1050 SLP Time Calculation (min) (ACUTE ONLY): 20 min Past Medical History: Past Medical History: Diagnosis Date  Coronary artery disease   Diabetes mellitus without complication (HCC)   Hypertension  Past Surgical History: Past Surgical History: Procedure Laterality Date  CORONARY ANGIOPLASTY WITH STENT PLACEMENT    CORONARY/GRAFT ACUTE MI REVASCULARIZATION N/A 02/09/2019  Procedure: Coronary/Graft Acute MI Revascularization;  Surgeon: Lyn Records, MD;  [facility]: [facility] CV [facility];  Service: Cardiovascular;  Laterality: N/A;  LEFT HEART CATH AND CORONARY ANGIOGRAPHY N/A 02/09/2019  Procedure: LEFT HEART CATH AND CORONARY ANGIOGRAPHY;  Surgeon: Lyn Records, MD;  Location: [facility] INVAS[facility] CV [facility];  Service: Cardiovascular;  Laterality: N/A; HPI: 73 y.o. female admitted on 02/03/19 for fever and cough (to [facility]) and was transferred to [facility] on 02/04/19 for hypotension and respiratory distress (acute respiratory failure/pulmonary edema) requiring intubation 3/10.  She was visiting family from [place] and COVID 19 tests were negative.  She was found to have group A strep bacteremia (septic shiock), rhinovirus,  and course complicated by NSTEMI and new LV dysfunction s/p cardiac cath on 02/09/19.  Other dx include ischemic cardiomyopathy, hyperkalemia, DM2- uncontrolled, and abdominal distention.  Pt with  significant PMH of DM, HTN, anc CAD. Trach placed on 3/31  Subjective: pt alert, anxious Assessment / Plan / Recommendation CHL IP CLINICAL IMPRESSIONS 03/19/2019 Clinical Impression Pt has a mild oropharyngeal dysphagia felt to be secondary to prolonged and repeated intubations and overall deconditioning. She has reduced lingual manipulation and slow, incomplete oral clearance, with lingual residue increasing as boluses become more solid. Base of tongue retraction, hyolaryngeal movement, and epiglottic inversion are all reduced. This results in decreased laryngeal vestibule closure, and pt has aspiration before and during the swallow with thin and nectar thick liquids that cannot be cleared fully  given intermittent spontaneous coughing. Aforementioned weakness also results in trace vallecular residue across consistencies but also with the barium tablet remaining lodged in the valleculae, requiring multiple pureed and honey thick liquid boluses to clear. Considering the above as well as her mentation and language barrier, limited compensatory strategies were attempted (although also not found to be effective). Recommend starting Dys 2 diet and honey thick liquids with PMV in place and full supervision provided. Will f/u for tolerance and hopeful improvement given additional therapy and time post-extubation. SLP Visit Diagnosis Dysphagia, oropharyngeal phase (R13.12) Attention and concentration deficit following -- Frontal lobe and executive function deficit following -- Impact on safety and function Moderate aspiration risk;Mild aspiration risk   CHL IP TREATMENT RECOMMENDATION 03/19/2019 Treatment Recommendations Therapy as outlined in treatment plan below   Prognosis 03/19/2019 Prognosis for Safe Diet Advancement Good Barriers to Reach Goals -- Barriers/Prognosis Comment -- CHL IP DIET RECOMMENDATION 03/19/2019 SLP Diet Recommendations Dysphagia 2 (Fine chop) solids;Honey thick liquids Liquid Administration via  Cup;Straw Medication Administration Crushed with puree Compensations Slow rate;Small sips/bites Postural Changes Seated upright at 90 degrees   CHL IP OTHER RECOMMENDATIONS 03/19/2019 Recommended Consults -- Oral Care Recommendations Oral care BID Other Recommendations Order thickener from pharmacy;Prohibited food (jello, ice cream, thin soups);Remove water pitcher;Place PMSV during PO intake   CHL IP FOLLOW UP RECOMMENDATIONS 03/19/2019 Follow up Recommendations Skilled Nursing facility   [facility]CHL IP FREQUENCY AND DURATION 03/19/2019 Speech Therapy Frequency (ACUTE ONLY) min 2x/week Treatment Duration 2 weeks      CHL IP ORAL PHASE 03/19/2019 Oral Phase Impaired Oral - Pudding Teaspoon -- Oral - Pudding Cup -- Oral - Honey Teaspoon Reduced posterior propulsion;Lingual/palatal residue Oral - Honey Cup Reduced posterior propulsion;Lingual/palatal residue Oral - Nectar Teaspoon Reduced posterior propulsion;Lingual/palatal residue Oral - Nectar Cup -- Oral - Nectar Straw -- Oral - Thin Teaspoon Premature spillage Oral - Thin Cup -- Oral - Thin Straw -- Oral - Puree Reduced posterior propulsion;Lingual/palatal residue Oral - Mech Soft Reduced posterior propulsion;Lingual/palatal residue;Impaired mastication Oral - Regular -- Oral - Multi-Consistency -- Oral - Pill Delayed oral transit Oral Phase - Comment --  CHL IP PHARYNGEAL PHASE 03/19/2019 Pharyngeal Phase Impaired Pharyngeal- Pudding Teaspoon -- Pharyngeal -- Pharyngeal- Pudding Cup -- Pharyngeal -- Pharyngeal- Honey Teaspoon Reduced epiglottic inversion;Reduced anterior laryngeal mobility;Reduced laryngeal elevation;Reduced airway/laryngeal closure;Reduced tongue base retraction;Pharyngeal residue - valleculae Pharyngeal -- Pharyngeal- Honey Cup Reduced epiglottic inversion;Reduced anterior laryngeal mobility;Reduced laryngeal elevation;Reduced airway/laryngeal closure;Reduced tongue base retraction;Pharyngeal residue - valleculae Pharyngeal -- Pharyngeal- Nectar Teaspoon  Reduced epiglottic inversion;Reduced anterior laryngeal mobility;Reduced laryngeal elevation;Reduced airway/laryngeal closure;Reduced tongue base retraction;Penetration/Aspiration before swallow;Pharyngeal residue - valleculae Pharyngeal Material enters airway, passes BELOW cords and not ejected out despite cough attempt by patient Pharyngeal- Nectar Cup -- Pharyngeal -- Pharyngeal- Nectar Straw -- Pharyngeal -- Pharyngeal- Thin Teaspoon Reduced epiglottic inversion;Reduced anterior laryngeal mobility;Reduced laryngeal elevation;Reduced airway/laryngeal closure;Reduced tongue base retraction;Penetration/Aspiration during swallow Pharyngeal Material enters airway, passes BELOW cords and not ejected out despite cough attempt by patient Pharyngeal- Thin Cup -- Pharyngeal -- Pharyngeal- Thin Straw -- Pharyngeal -- Pharyngeal- Puree Reduced epiglottic inversion;Reduced anterior laryngeal mobility;Reduced laryngeal elevation;Reduced airway/laryngeal closure;Reduced tongue base retraction;Pharyngeal residue - valleculae Pharyngeal -- Pharyngeal- Mechanical Soft Reduced epiglottic inversion;Reduced anterior laryngeal mobility;Reduced laryngeal elevation;Reduced airway/laryngeal closure;Reduced tongue base retraction;Pharyngeal residue - valleculae Pharyngeal -- Pharyngeal- Regular -- Pharyngeal -- Pharyngeal- Multi-consistency -- Pharyngeal -- Pharyngeal- Pill Reduced epiglottic inversion;Reduced anterior laryngeal mobility;Reduced laryngeal elevation;Reduced airway/laryngeal closure;Reduced tongue base retraction;Pharyngeal residue - valleculae Pharyngeal -- Pharyngeal Comment --  CHL IP  CERVICAL ESOPHAGEAL PHASE 03/19/2019 Cervical Esophageal Phase WFL Pudding Teaspoon -- Pudding Cup -- Honey Teaspoon -- Honey Cup -- Nectar Teaspoon -- Nectar Cup -- Nectar Straw -- Thin Teaspoon -- Thin Cup -- Thin Straw -- Puree -- Mechanical Soft -- Regular -- Multi-consistency -- Pill -- Cervical Esophageal Comment -- [name] Nix  03/19/2019, 5:19 PM  [name], M.A. CCC-SLP Acute Rehabilitation Services Pager [phone] Office [phone]                Assessment/Plan: Diagnosis: Debility after exacerbation of CHF as well as respiratory failure 1. Does the need for close, 24 hr/day medical supervision in concert with the patient's rehab needs make it unreasonable for this patient to be served in a less intensive setting? Yes 2. Co-Morbidities requiring supervision/potential complications: Coronary artery disease status post coronary stents with distal reocclusion, A. fib RVR, dysphasia, diabetes, CKD 3 due to bladder management, bowel management, safety, skin/wound care, disease management, medication administration, pain management, patient education and Trach care, does the patient require 24 hr/day rehab nursing? Yes 3. Does the patient require coordinated care of a physician, rehab nurse, PT (1-2 hrs/day, 5 days/week), OT (1-2 hrs/day, 5 days/week) and SLP (.5-1 hrs/day, 5 days/week) to address physical and functional deficits in the context of the above medical diagnosis(es)? Yes, speech recommended for swallowing as well as for cognition Addressing deficits in the following areas: balance, endurance, locomotion, strength, transferring, bowel/bladder control, bathing, dressing, feeding, grooming, toileting, cognition, speech, swallowing and psychosocial support 4. Can the patient actively participate in an intensive therapy program of at least 3 hrs of therapy per day at least 5 days per week? Potentially 5. The potential for patient to make measurable gains while on inpatient rehab is fair 6. Anticipated functional outcomes upon discharge from inpatient rehab are supervision  with PT, supervision with OT, supervision with SLP. 7. Estimated rehab length of stay to reach the above functional goals is: 14 to 18 days 8. Anticipated D/C setting: Home 9. Anticipated post D/C treatments: HH therapy 10. Overall  Rehab/Functional Prognosis: good  RECOMMENDATIONS: This patient's condition is appropriate for continued rehabilitative care in the following setting: CIR Patient has agreed to participate in recommended program. No Note that insurance prior authorization may be required for reimbursement for recommended care.  Comment: Patient states that she refuses to be transferred to another part of the hospital.  She states that she will go home from this unit.  "Do not send me to rehab "  "I have personally performed a face to face diagnostic evaluation of this patient.  Additionally, I have reviewed and concur with the physician assistant's documentation above."  Gabrielle Wright M.D. [facility] FAAPM&R (Sports Med, Neuromuscular Med) Diplomate Am Board of Electrodiagnostic Med  Lynnae Prude 03/20/2019        Revision History                   Routing History

## 2019-03-28 ENCOUNTER — Inpatient Hospital Stay (HOSPITAL_COMMUNITY): Payer: Medicaid Other

## 2019-03-28 ENCOUNTER — Inpatient Hospital Stay (HOSPITAL_COMMUNITY): Payer: Medicaid Other | Admitting: Occupational Therapy

## 2019-03-28 ENCOUNTER — Inpatient Hospital Stay (HOSPITAL_COMMUNITY): Payer: Medicaid Other | Admitting: Physical Therapy

## 2019-03-28 ENCOUNTER — Inpatient Hospital Stay (HOSPITAL_COMMUNITY): Payer: Medicaid Other | Admitting: Speech Pathology

## 2019-03-28 DIAGNOSIS — I5043 Acute on chronic combined systolic (congestive) and diastolic (congestive) heart failure: Secondary | ICD-10-CM

## 2019-03-28 DIAGNOSIS — I482 Chronic atrial fibrillation, unspecified: Secondary | ICD-10-CM

## 2019-03-28 DIAGNOSIS — Z930 Tracheostomy status: Secondary | ICD-10-CM

## 2019-03-28 DIAGNOSIS — I10 Essential (primary) hypertension: Secondary | ICD-10-CM

## 2019-03-28 DIAGNOSIS — R5381 Other malaise: Principal | ICD-10-CM

## 2019-03-28 LAB — CBC WITH DIFFERENTIAL/PLATELET
Abs Immature Granulocytes: 0.04 10*3/uL (ref 0.00–0.07)
Basophils Absolute: 0 10*3/uL (ref 0.0–0.1)
Basophils Relative: 1 %
Eosinophils Absolute: 0.1 10*3/uL (ref 0.0–0.5)
Eosinophils Relative: 1 %
HCT: 30.8 % — ABNORMAL LOW (ref 36.0–46.0)
Hemoglobin: 9.3 g/dL — ABNORMAL LOW (ref 12.0–15.0)
Immature Granulocytes: 1 %
Lymphocytes Relative: 33 %
Lymphs Abs: 2.6 10*3/uL (ref 0.7–4.0)
MCH: 25.9 pg — ABNORMAL LOW (ref 26.0–34.0)
MCHC: 30.2 g/dL (ref 30.0–36.0)
MCV: 85.8 fL (ref 80.0–100.0)
Monocytes Absolute: 0.6 10*3/uL (ref 0.1–1.0)
Monocytes Relative: 7 %
Neutro Abs: 4.6 10*3/uL (ref 1.7–7.7)
Neutrophils Relative %: 57 %
Platelets: 302 10*3/uL (ref 150–400)
RBC: 3.59 MIL/uL — ABNORMAL LOW (ref 3.87–5.11)
RDW: 16.9 % — ABNORMAL HIGH (ref 11.5–15.5)
WBC: 7.9 10*3/uL (ref 4.0–10.5)
nRBC: 0 % (ref 0.0–0.2)

## 2019-03-28 LAB — GLUCOSE, CAPILLARY
Glucose-Capillary: 113 mg/dL — ABNORMAL HIGH (ref 70–99)
Glucose-Capillary: 153 mg/dL — ABNORMAL HIGH (ref 70–99)
Glucose-Capillary: 159 mg/dL — ABNORMAL HIGH (ref 70–99)
Glucose-Capillary: 176 mg/dL — ABNORMAL HIGH (ref 70–99)
Glucose-Capillary: 207 mg/dL — ABNORMAL HIGH (ref 70–99)
Glucose-Capillary: 260 mg/dL — ABNORMAL HIGH (ref 70–99)

## 2019-03-28 LAB — COMPREHENSIVE METABOLIC PANEL
ALT: 11 U/L (ref 0–44)
AST: 19 U/L (ref 15–41)
Albumin: 2.9 g/dL — ABNORMAL LOW (ref 3.5–5.0)
Alkaline Phosphatase: 57 U/L (ref 38–126)
Anion gap: 11 (ref 5–15)
BUN: 13 mg/dL (ref 8–23)
CO2: 28 mmol/L (ref 22–32)
Calcium: 9 mg/dL (ref 8.9–10.3)
Chloride: 98 mmol/L (ref 98–111)
Creatinine, Ser: 1.1 mg/dL — ABNORMAL HIGH (ref 0.44–1.00)
GFR calc Af Amer: 58 mL/min — ABNORMAL LOW (ref >60)
GFR calc non Af Amer: 50 mL/min — ABNORMAL LOW (ref >60)
Glucose, Bld: 178 mg/dL — ABNORMAL HIGH (ref 70–99)
Potassium: 4.1 mmol/L (ref 3.5–5.1)
Sodium: 137 mmol/L (ref 135–145)
Total Bilirubin: 0.8 mg/dL (ref 0.3–1.2)
Total Protein: 6.9 g/dL (ref 6.5–8.1)

## 2019-03-28 MED ORDER — INSULIN ASPART 100 UNIT/ML ~~LOC~~ SOLN
0.0000 [IU] | Freq: Three times a day (TID) | SUBCUTANEOUS | Status: DC
  Administered 2019-03-28: 2 [IU] via SUBCUTANEOUS
  Administered 2019-03-28: 12 [IU] via SUBCUTANEOUS
  Administered 2019-03-28 – 2019-03-29 (×2): 8 [IU] via SUBCUTANEOUS
  Administered 2019-03-29 – 2019-03-30 (×4): 4 [IU] via SUBCUTANEOUS
  Administered 2019-03-30: 2 [IU] via SUBCUTANEOUS
  Administered 2019-03-30: 8 [IU] via SUBCUTANEOUS
  Administered 2019-03-30: 2 [IU] via SUBCUTANEOUS
  Administered 2019-03-31: 4 [IU] via SUBCUTANEOUS
  Administered 2019-03-31: 8 [IU] via SUBCUTANEOUS
  Administered 2019-03-31: 4 [IU] via SUBCUTANEOUS
  Administered 2019-03-31: 12 [IU] via SUBCUTANEOUS
  Administered 2019-04-01: 2 [IU] via SUBCUTANEOUS
  Administered 2019-04-01: 4 [IU] via SUBCUTANEOUS
  Administered 2019-04-01: 8 [IU] via SUBCUTANEOUS
  Administered 2019-04-01 – 2019-04-02 (×3): 4 [IU] via SUBCUTANEOUS
  Administered 2019-04-02 – 2019-04-03 (×4): 2 [IU] via SUBCUTANEOUS
  Administered 2019-04-03: 8 [IU] via SUBCUTANEOUS
  Administered 2019-04-04: 2 [IU] via SUBCUTANEOUS
  Administered 2019-04-04 (×2): 4 [IU] via SUBCUTANEOUS
  Administered 2019-04-05: 2 [IU] via SUBCUTANEOUS
  Administered 2019-04-05: 4 [IU] via SUBCUTANEOUS

## 2019-03-28 MED ORDER — PANTOPRAZOLE SODIUM 40 MG PO TBEC
40.0000 mg | DELAYED_RELEASE_TABLET | Freq: Every day | ORAL | Status: DC
  Administered 2019-03-28 – 2019-04-05 (×9): 40 mg via ORAL
  Filled 2019-03-28 (×9): qty 1

## 2019-03-28 NOTE — Progress Notes (Addendum)
Respiratory Event  03/28/2019 0030: NT alerts RN that pt is in distress concerning tracheostomy. Upon assessment, pt is sitting on side of bed, anxious, complaining of SOB with productive cough. Pt is suctioned and inner cannula cleansed. Pt feels some relief. NT takes SpO2 sat and reading is 100%. Upon further inspection, RN does not see tracheostomy checklist and equipment behind HOB. RN calls 2W and the equipment and checklist are brought from that unit to inpatient rehab. Equipment and checklist are now posted behind HOB. Continuous pulse oximeter is placed on R 2nd metacarpal. Pt is now resting with call light at side. RN will continue to monitor.

## 2019-03-28 NOTE — Evaluation (Signed)
Occupational Therapy Assessment and Plan  Patient Details  Name: Gabrielle Wright MRN: [id number] Date of Birth: August 08, 1946  OT Diagnosis: acute pain and muscle weakness (generalized) Rehab Potential: Rehab Potential (ACUTE ONLY): Good ELOS: 7-10 days   Today's Date: 03/28/2019 OT Individual Time: 6256-3893 OT Individual Time Calculation (min): 47 min  and Today's Date: 03/28/2019 OT Missed Time: 13 Minutes Missed Time Reason: Patient fatigue;Other (comment)(anxiety)    Problem List:  Patient Active Problem List   Diagnosis Date Noted  . Debility 03/27/2019  . Palliative care by specialist   . Goals of care, counseling/discussion   . Tracheostomy status ([place])   . HCAP (healthcare-associated pneumonia)   . Acute pulmonary edema (H[facility])   . Abdominal distension (gaseous)   . Acute on chronic respiratory failure with hypoxia ([place])   . AKI (acute kidney injury) ([place])   . Septic shock ([place])   . CAD (coronary artery disease), native coronary artery 02/09/2019  . 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 ([place])   . Sepsis ([place]) 02/04/2019  . Hypotension 02/04/2019  . Acute respiratory failure with hypoxemia [facility])     Past Medical History:  Past Medical History:  Diagnosis Date  . Coronary artery disease   . Diabetes mellitus without complication ([place])   . Hypertension    Past Surgical History:  Past Surgical History:  Procedure Laterality Date  . CORONARY ANGIOPLASTY WITH STENT PLACEMENT    . CORONARY/GRAFT ACUTE MI REVASCULARIZATION N/A 02/09/2019   Procedure: Coronary/Graft Acute MI Revascularization;  Surgeon: Belva Crome, MD;  Location: [place] [facility];  Service: Cardiovascular;  Laterality: N/A;  . LEFT HEART CATH AND CORONARY ANGIOGRAPHY N/A 02/09/2019   Procedure: LEFT HEART CATH AND CORONARY ANGIOGRAPHY;  Surgeon: Belva Crome, MD;  Location: [place] [facility];  Service: Cardiovascular;  Laterality: N/A;    Assessment &  Plan Clinical Impression: Patient is a 73 y.o. 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 Per chart review patient is from [place] was visiting family in the area. Reportedly independent prior to admission. Presented 02/03/2019 with fever as well as cough to [facility] and was transferred to [facility] 02/04/2019 for hypertension/septic shock, respiratory distress requiring intubation. She was maintained on IV pressors. Lactic acid 1.7. COVID-19 test negative. She was found to have group A strep bacteremia with septic shock. Hospital course complicated by non-STEMI identified per EKG. 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 Underwent cardiac catheterization 02/09/2019 per Dr. Daneen Schick showing 20 to 30% diffuse narrowing within the proximal LAD stent and diffuse narrowing in the distal LAD apical segment, patent stent in the mid to distal obtuse marginal with 89% stenosis distal to the stent with active thrombus noted and currently maintained on aspirin. Bouts of atrial fibrillation with RVR continued on aspirin therapy as well as the addition of amiodarone. Due to ongoing respiratory failure with hypoxemia with failed extubation patient underwent tracheostomy tube placement3/30/2020 per Dr. Nelda Marseille and has been downsized to a #4 cuffless. Maintained on a dysphagia #2 honey thick liquid diet.Patient currently undergoing capping with ultimate plans to decannulate. Acute on chronic combined systolic and diastolic congestive heart failure monitoring for any signs of fluid overload and remains on Lasix therapy. Acute on chronic anemia hemoglobin ranging from 8.1-9.0. Palliative care has been consulted for establishing  goals of care. Therapy evaluations  completed and patient was admitted for a comprehensive rehab program.   Patient transferred to CIR on 03/27/2019 .    Patient currently requires min with basic self-care skills secondary to muscle weakness, decreased cardiorespiratoy endurance and decreased oxygen support and decreased balance strategies.  Prior to hospitalization, patient could complete ADLs with independent .  Patient will benefit from skilled intervention to increase independence with basic self-care skills prior to discharge home with care partner.  Anticipate patient will require 24 hour supervision and follow up home health.  OT - End of Session Activity Tolerance: Tolerates 30+ min activity with multiple rests Endurance Deficit: Yes Endurance Deficit Description: limited participation due to fatigue and anxiety OT Assessment Rehab Potential (ACUTE ONLY): Good OT Barriers to Discharge: Trach OT Patient demonstrates impairments in the following area(s): Balance;Endurance;Motor;Pain;Safety OT Basic ADL's Functional Problem(s): Grooming;Bathing;Dressing;Toileting OT Transfers Functional Problem(s): Toilet;Tub/Shower OT Additional Impairment(s): None OT Plan OT Intensity: Minimum of 1-2 x/day, 45 to 90 minutes OT Frequency: Total of 15 hours over 7 days of combined therapies OT Duration/Estimated Length of Stay: 7-10 days OT Treatment/Interventions: Balance/vestibular training;Community reintegration;Discharge planning;Disease mangement/prevention;DME/adaptive equipment instruction;Functional mobility training;Pain management;Patient/family education;Psychosocial support;Self Care/advanced ADL retraining;Therapeutic Activities;Therapeutic Exercise;UE/LE Strength taining/ROM;UE/LE Coordination activities OT Basic Self-Care Anticipated Outcome(s): Supervision OT Toileting Anticipated Outcome(s): Supervision OT Bathroom Transfers Anticipated Outcome(s): Supervision OT Recommendation Patient destination: Home Follow Up  Recommendations: Home health OT Equipment Recommended: Tub/shower bench   Skilled Therapeutic Intervention OT eval completed with discussion of rehab process, OT purpose, POC, and goals.  Pt declined ADL assessment due to fatigue from previous therapy sessions and reporting anxiety with increased mobility.  Pt stated "tomorrow" when encouraged to engage in bathing and dressing tasks.  Engaged in sit > stand from recliner with min/hand held assist and short distance ambulation in room with hand held assist.  Pt declined any transfers, stating she preferred to sit upright in recliner.  Engaged in discussion regarding therapy goals for increased activity tolerance/endurance.  Pt remained upright in recliner with all needs in reach and chair alarm on.  [employer] video interpreting used throughout session.  OT Evaluation Precautions/Restrictions  Precautions Precautions: Fall General OT Amount of Missed Time: 13 Minutes Vital Signs Therapy Vitals Temp: 7 F (36.7 C) Temp Source: Oral Pulse Rate: 76 Resp: 19 BP: (!) 103/48 Patient Position (if appropriate): Sitting Oxygen Therapy SpO2: 100 % O2 Device: Tracheostomy Collar O2 Flow Rate (L/min): 3 L/min FiO2 (%): 28 % Pain Pain Assessment Pain Scale: 0-10 Pain Score: 0-No pain Home Living/Prior Functioning Home Living Family/patient expects to be discharged to:: Private residence Living Arrangements: Children Available Help at Discharge: Family, Available 24 hours/day Type of Home: House Home Access: Level entry Home Layout: Two level, Able to live on main level with bedroom/bathroom Alternate Level Stairs-Number of Steps: full flight Bathroom Shower/Tub: Gaffer, Chiropodist: Standard  Lives With: Daughter IADL History Homemaking Responsibilities: Yes Meal Prep Responsibility: Building surveyor Responsibility: Secondary Cleaning Responsibility: Secondary Homemaking Comments: pt reports that she  would help out with cooking and cleaning Education: 10th grade Prior Function Level of Independence: Independent with basic ADLs Vocation: Retired Comments: pt visits the [place] 7 months out of the year, was at her daughter's x 10 days when she became ill Vision Baseline Vision/History: Wears glasses;Cataracts(had cataract surgery on one eye) Wears Glasses: At all times Vision Assessment?: No apparent visual deficits Cognition Overall Cognitive Status: Within Functional Limits for tasks assessed Arousal/Alertness: Awake/alert Orientation Level: Person;Situation Memory:  Appears intact Immediate Memory Recall: Sock;Blue;Bed Attention: Selective;Sustained Focused Attention: Appears intact Sustained Attention: Appears intact Selective Attention: Appears intact Awareness: Appears intact Safety/Judgment: Appears intact Sensation Sensation Light Touch: Appears Intact Extremity/Trunk Assessment RUE Assessment RUE Assessment: Within Functional Limits LUE Assessment LUE Assessment: Within Functional Limits     Refer to Care Plan for Long Term Goals  Recommendations for other services: None    Discharge Criteria: Patient will be discharged from OT if patient refuses treatment 3 consecutive times without medical reason, if treatment goals not met, if there is a change in medical status, if patient makes no progress towards goals or if patient is discharged from hospital.  The above assessment, treatment plan, treatment alternatives and goals were discussed and mutually agreed upon: by patient  Simonne Come 03/28/2019, 3:07 PM

## 2019-03-28 NOTE — Progress Notes (Signed)
Patient's trach was capped per MD order. Patient was placed on a 3L [place] with SAT's of 100%. Patient began to have anxiety & was assured by RT with the help of the translator that she was doing well with capping. Before translator ended conversation patient denied any shortness of breath and stated that she did not have any further questions for RT. Will make RN aware that patient's trach has been capped at this time. Will uncap trach QHS per MD order.

## 2019-03-28 NOTE — Progress Notes (Signed)
Patient reported chest pain/palpatations to physical therapy after returning from therapy. O2 saturation at 99% on 5L, 28%, Trach collar. Radial pulse is 86 bpm. BP is 114/56. Patient complains of headache however, received 650 mg.Tylenol at 1030. Deatra Ina, PA-C aware of patient status and await orders from MD.

## 2019-03-28 NOTE — IPOC Note (Addendum)
Overall Plan of Care [facility]) Patient Details Name: Gabrielle Wright MRN: [id number] DOB: 1946/02/27  Admitting Diagnosis: <principal problem not specified>  Hospital Problems: Active Problems:   Debility     Functional Problem List: Nursing Medication Management, Safety, Endurance, Nutrition  PT Balance, Motor, Safety, Behavior, Pain, Endurance  OT Balance, Endurance, Motor, Pain, Safety  SLP    TR         Basic ADL's: OT Grooming, Bathing, Dressing, Toileting     Advanced  ADL's: OT       Transfers: PT Bed Mobility, Bed to Chair, Car, Occupational psychologist, Research scientist (life sciences): PT Ambulation, Educational psychologist, Psychologist, prison and probation services     Additional Impairments: OT None  SLP        TR      Anticipated Outcomes Item Anticipated Outcome  Self Feeding    Swallowing  Mod I   Basic self-care  Marketing executive Transfers Supervision  Bowel/Bladder     Transfers  supervision with LRAD  Locomotion  supervision with LRAD  Communication  independent with PMSV use and care, if not decannulated  Cognition     Pain     Safety/Judgment  Maintain safety with cues/reminders   Therapy Plan: PT Intensity: Minimum of 1-2 x/day ,45 to 90 minutes PT Frequency: 5 out of 7 days PT Duration Estimated Length of Stay: 7-10 days OT Intensity: Minimum of 1-2 x/day, 45 to 90 minutes OT Frequency: Total of 15 hours over 7 days of combined therapies OT Duration/Estimated Length of Stay: 7-10 days SLP Intensity: Minumum of 1-2 x/day, 30 to 90 minutes SLP Frequency: 3 to 5 out of 7 days SLP Duration/Estimated Length of Stay: 1-2 weeks   Due to the current state of emergency, patients may not be receiving their 3-hours of Medicare-mandated therapy.   Team Interventions: Nursing Interventions Patient/Family Education, Discharge Planning, Medication Management, Dysphagia/Aspiration Precaution Training, Disease Management/Prevention  PT interventions  Ambulation/gait training, Cognitive remediation/compensation, Discharge planning, DME/adaptive equipment instruction, Functional mobility training, Pain management, Psychosocial support, Therapeutic Activities, UE/LE Strength taining/ROM, Wheelchair propulsion/positioning, UE/LE Coordination activities, Therapeutic Exercise, Stair training, Neuromuscular re-education, Patient/family education, Skin care/wound management, Disease management/prevention, Firefighter, Warden/ranger  OT Interventions Warden/ranger, Firefighter, Discharge planning, Disease mangement/prevention, Fish farm manager, Functional mobility training, Pain management, Patient/family education, Psychosocial support, Self Care/advanced ADL retraining, Therapeutic Activities, Therapeutic Exercise, UE/LE Strength taining/ROM, UE/LE Coordination activities  SLP Interventions Functional tasks, Environmental controls, Dysphagia/aspiration precaution training, Patient/family education  TR Interventions    SW/CM Interventions Discharge Planning, Psychosocial Support, Patient/Family Education   Barriers to Discharge MD  Medical stability  Nursing      PT Trach, Incontinence    OT Trach    SLP (language barrier - communicates well with audio interpreter)    SW       Team Discharge Planning: Destination: PT-Home ,OT- Home , SLP-Home Projected Follow-up: PT-Home health PT, 24 hour supervision/assistance, OT-  Home health OT, SLP-(TBD) Projected Equipment Needs: PT-Rolling walker with 5" wheels, OT- Tub/shower bench, SLP-To be determined Equipment Details: PT-transport w/c for community use, OT-  Patient/family involved in discharge planning: PT- Patient unable/family or caregiver not available,  OT-Patient unable/family or caregiver not available, SLP-Patient  MD ELOS: 8-11 days Medical Rehab Prognosis:  Good Assessment: The patient has been admitted for CIR  therapies with the diagnosis of debility after sepsis and multiple medical. The team will be addressing functional mobility, strength, stamina, balance, safety, adaptive techniques and  equipment, self-care, bowel and bladder mgt, patient and caregiver education, NMR, activity tolerance, trach mgt, speech, swallowing, community reentry. Goals have been set at supervision with self-care and mobility, supervision to mod I with speech and communication.  Due to the current state of emergency, patients may not be receiving their 3 hours per day of Medicare-mandated therapy.    Ranelle Oyster, MD, FAAPMR      See Team Conference Notes for weekly updates to the plan of care

## 2019-03-28 NOTE — Evaluation (Signed)
Physical Therapy Assessment and Plan  Patient Details  Name: Gabrielle Wright MRN: [id number] Date of Birth: [birth year]  PT Diagnosis: Abnormal posture, Abnormality of gait, Cognitive deficits, Coordination disorder, Difficulty walking, Impaired cognition and Muscle weakness Rehab Potential: Good ELOS: 7-10 days   Today's Date: 03/28/2019 PT Individual Time: 6967-8938 PT Individual Time Calculation (min): 76 min    Problem List:  Patient Active Problem List   Diagnosis Date Noted  . Debility 03/27/2019  . Palliative care by specialist   . Goals of care, counseling/discussion   . Tracheostomy status ([place])   . HCAP (healthcare-associated pneumonia)   . Acute pulmonary edema (HCC)   . Abdominal distension (gaseous)   . Acute on chronic respiratory failure with hypoxia ([place])   . AKI (acute kidney injury) ([place])   . Septic shock ([place])   . CAD (coronary artery disease), native coronary artery 02/09/2019  . 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 ([place])   . Sepsis ([place]) 02/04/2019  . Hypotension 02/04/2019  . Acute respiratory failure with hypoxemia [facility])     Past Medical History:  Past Medical History:  Diagnosis Date  . Coronary artery disease   . Diabetes mellitus without complication ([place])   . Hypertension    Past Surgical History:  Past Surgical History:  Procedure Laterality Date  . CORONARY ANGIOPLASTY WITH STENT PLACEMENT    . CORONARY/GRAFT ACUTE MI REVASCULARIZATION N/A 02/09/2019   Procedure: Coronary/Graft Acute MI Revascularization;  Surgeon: Belva Crome, MD;  Location: [place] [facility];  Service: Cardiovascular;  Laterality: N/A;  . LEFT HEART CATH AND CORONARY ANGIOGRAPHY N/A 02/09/2019   Procedure: LEFT HEART CATH AND CORONARY ANGIOGRAPHY;  Surgeon: Belva Crome, MD;  Location: [place] [facility];  Service: Cardiovascular;  Laterality: N/A;    Assessment & Plan Clinical Impression: Patient is a 73 y.o. 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 Per chart review patient is from [place] was visiting family in the area. Reportedly independent prior to admission. Presented 02/03/2019 with fever as well as cough to [facility] and was [facility] for hypertension/septic shock, respiratory distress requiring intubation. She was maintained on IV pressors. Lactic acid 1.7. COVID-19 test negative. She was found to have group A strep bacteremia with septic shock. Hospital course complicated by non-STEMI identified per EKG. 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 Underwent cardiac catheterization 02/09/2019 per Dr. Daneen Schick showing 20 to 30% diffuse narrowing within the proximal LAD stent and diffuse narrowing in the distal LAD apical segment, patent stent in the mid to distal obtuse marginal with 89% stenosis distal to the stent with active thrombus noted and currently maintained on aspirin. Bouts of atrial fibrillation with RVR continued on aspirin therapy as well as the addition of amiodarone. Due to ongoing respiratory failure with hypoxemia with failed extubation patient underwent tracheostomy tube placement3/30/2020 per Dr. Nelda Marseille and has been downsized to a #4 cuffless. Maintained on a dysphagia #2 honey thick liquid diet.Patient currently undergoing capping with ultimate plans to decannulate. Acute on chronic combined systolic and diastolic congestive heart failure monitoring for any signs of fluid overload and remains on Lasix therapy. Acute on chronic anemia hemoglobin ranging from 8.1-9.0. Palliative care has been consulted for establishing goals of care. Therapy evaluations completed and patient  was admitted for a  comprehensive rehab program.  Patient transferred to CIR on 03/27/2019 .   Patient currently requires min with mobility secondary to muscle weakness, decreased cardiorespiratoy endurance and decreased oxygen support, decreased coordination,  decreased safety awareness and decreased memory, and decreased standing balance, decreased postural control and decreased balance strategies.  Prior to hospitalization, patient was independent  with mobility and lived with Daughter in a House home.  Home access is  Level entry.  Patient will benefit from skilled PT intervention to maximize safe functional mobility, minimize fall risk and decrease caregiver burden for planned discharge home with 24 hour assist.  Anticipate patient will benefit from follow up [facility] at discharge.  PT - End of Session Activity Tolerance: Tolerates 30+ min activity with multiple rests Endurance Deficit: Yes Endurance Deficit Description: fatigue, poor cardiopulmonary endurance PT Assessment Rehab Potential (ACUTE/IP ONLY): Good PT Barriers to Discharge: Trach;Incontinence PT Patient demonstrates impairments in the following area(s): Balance;Motor;Safety;Behavior;Pain;Endurance PT Transfers Functional Problem(s): Bed Mobility;Bed to Chair;Car;Furniture PT Locomotion Functional Problem(s): Ambulation;Stairs;Wheelchair Mobility PT Plan PT Intensity: Minimum of 1-2 x/day ,45 to 90 minutes PT Frequency: 5 out of 7 days PT Duration Estimated Length of Stay: 7-10 days PT Treatment/Interventions: Ambulation/gait training;Cognitive remediation/compensation;Discharge planning;DME/adaptive equipment instruction;Functional mobility training;Pain management;Psychosocial support;Therapeutic Activities;UE/LE Strength taining/ROM;Wheelchair propulsion/positioning;UE/LE Coordination activities;Therapeutic Exercise;Stair training;Neuromuscular re-education;Patient/family education;Skin care/wound management;Disease management/prevention;Community  reintegration;Balance/vestibular training PT Transfers Anticipated Outcome(s): supervision with LRAD PT Locomotion Anticipated Outcome(s): supervision with LRAD PT Recommendation Recommendations for Other Services: Speech consult Follow Up Recommendations: Home health PT;24 hour supervision/assistance Patient destination: Home Equipment Recommended: Rolling walker with 5" wheels Equipment Details: transport w/c for community use  Skilled Therapeutic Intervention Patient received in recliner with RN present administering meds. Pt on 2.5L/min 28% FiO2 when she was supposed to be on 5L/min for 28% FiO2 with therapist adjusting & bringing this to RN's attention. SpO2 remains >90% throughout session on 28% FiO2 via trach collar (5L/min flow rate when attached to wall oxygen, 3L/min when attached to portable tank). Pt asking about trach being removed and therapist educated her on purpose of trach & progression to having it removed. Provided pt with w/c cushion to prevent skin breakdown when OOB. Pt ambulates 15 ft without AD and min assist, negotiates 4 steps (6") with B rails and min assist, and completes bed mobility with supervision & use of bed rails & HOB elevated. Pt propels w/c 30 ft with mod assist for steering before becoming fatigued. Pt does report shortness of breath after stair negotiation with SpO2 >90%. At end of session pt assisted to bed and reports "heart palpitations" with HR = ~100 bpm decreasing down to 89 bpm with rest. RN made aware & in to assess pt. Pt left in bed in care of RN.  Utilized [employer] video interpreter to communicate with pt during session.   PT Evaluation Precautions/Restrictions Precautions Precautions: Fall Precaution Comments: trach collar Restrictions Weight Bearing Restrictions: No  General Chart Reviewed: Yes Additional Pertinent History: CHF, CAD with STEMI, DM, CKD stage 3, HTN, a-fib with RVR Response to Previous Treatment: Patient with no complaints  from previous session. Family/Caregiver Present: No   Pain Pt c/o throat pain 2/2 coughing & trach. RN administered meds at beginning of session.  Home Living/Prior Functioning Home Living Available Help at Discharge: Family;Available 24 hours/day Type of Home: House Home Access: Level entry Home Layout: Two level;Able to live on main level with bedroom/bathroom Alternate Level Stairs-Number of Steps: full flight Alternate Level Stairs-Rails: Right Bathroom Shower/Tub: Walk-in  shower;Tub/shower unit Bathroom Toilet: Standard Bathroom Accessibility: Yes Additional Comments: unsure, pt is unable to report and family is not currently present.   Lives With: Daughter Prior Function Level of Independence: Independent with basic ADLs;Independent with homemaking with ambulation;Independent with gait;Independent with transfers  Able to Take Stairs?: Yes Vocation: Retired Comments: pt visits the [place] 7 months out of the year, was at her daughter's x 10 days when she became ill  Vision/Perception  Pt wears glasses at baseline. Pt with hx of cataract surgery in 1 eye.  No apparent visual deficits.   Cognition Overall Cognitive Status: Difficult to assess Arousal/Alertness: Awake/alert Orientation Level: Oriented X4(oriented to year only (2020) not oriented to month, day) Attention: Selective;Sustained Sustained Attention: Appears intact Selective Attention: Appears intact Memory: Impaired Awareness: Impaired Awareness Impairment: Anticipatory impairment Behaviors: Perseveration(perseverative on trach being removed) Safety/Judgment: Impaired  Sensation Sensation Light Touch: Not tested Proprioception: Not tested Coordination Gross Motor Movements are Fluid and Coordinated: Yes Fine Motor Movements are Fluid and Coordinated: Not tested  Motor  Motor Motor: Abnormal postural alignment and control Motor - Skilled Clinical Observations: generalized deconditioning   Mobility Bed  Mobility Bed Mobility: Sit to Supine;Supine to Sit(with HOB elevated, bed rails) Supine to Sit: Supervision/Verbal cueing Sit to Supine: Supervision/Verbal cueing Transfers Transfers: Sit to Stand;Stand to Sit Sit to Stand: Minimal Assistance - Patient > 75% Stand to Sit: Minimal Assistance - Patient > 75%  Locomotion  Gait Ambulation: Yes Gait Assistance: Minimal Assistance - Patient > 75% Gait Distance (Feet): 15 Feet Assistive device: None Gait Gait: Yes Gait Pattern: (lateral sway) Gait velocity: decreased Stairs / Additional Locomotion Stairs: Yes Stairs Assistance: Minimal Assistance - Patient > 75% Stair Management Technique: Two rails Number of Stairs: 4 Height of Stairs: 6(inches) Product manager Mobility: Yes Wheelchair Assistance: Moderate Assistance - Patient 50 - 74% Wheelchair Propulsion: Both upper extremities Wheelchair Parts Management: Needs assistance Distance: 30 ft    Trunk/Postural Assessment  Lumbar Assessment Lumbar Assessment: Exceptions to WFL(posterior pelvic tilt) Postural Control Postural Control: Deficits on evaluation Righting Reactions: delayed Protective Responses: delayed   Balance Balance Balance Assessed: Yes Dynamic Standing Balance Dynamic Standing - Balance Support: No upper extremity supported Dynamic Standing - Level of Assistance: 4: Min assist Dynamic Standing - Balance Activities: (gait without AD)  Extremity Assessment  RUE Assessment RUE Assessment: Within Functional Limits LUE Assessment LUE Assessment: Within Functional Limits RLE Assessment RLE Assessment: Within Functional Limits General Strength Comments: grossly 3+5, no buckling noted in weight bearing LLE Assessment LLE Assessment: Within Functional Limits General Strength Comments: grossly 3+5, no buckling noted in weight bearing    Refer to Care Plan for Long Term Goals  Recommendations for other services: None   Discharge Criteria:  Patient will be discharged from PT if patient refuses treatment 3 consecutive times without medical reason, if treatment goals not met, if there is a change in medical status, if patient makes no progress towards goals or if patient is discharged from hospital.  The above assessment, treatment plan, treatment alternatives and goals were discussed and mutually agreed upon: by patient  Waunita Schooner 03/28/2019, 4:27 PM

## 2019-03-28 NOTE — Progress Notes (Addendum)
Respiratory Event  03/28/2019 0215: Pt sitting up in bed pointing at tracheostomy site. Pt suctioned by RN and pt feels some relief. PMV is placed so that pt may speak with interpreter. Per translator, pt states that she is "having trouble breathing." RN instructs pt to cough via translator, pt complies, and feels some relief. Pt asks to be toileted via BSC. Pt voids and coughs while sitting in upright position. Mucous plug is expelled from tracheostomy site. RN points to recliner and pt nods "yes" that she would like to sit in the recliner. Pt is resting in recliner with call light at side. RN will continue to monitor.

## 2019-03-28 NOTE — Evaluation (Signed)
Speech Language Pathology Assessment and Plan  Patient Details  Name: Gabrielle Wright MRN: [id number] Date of Birth: [birth year]  SLP Diagnosis: Dysphagia;Voice disorder  Rehab Potential: Good ELOS: 1-2 weeks    Today's Date: 03/28/2019 SLP Individual Time: 2778-2423 SLP Individual Time Calculation (min): 50 min   Problem List:  Patient Active Problem List   Diagnosis Date Noted  . Debility 03/27/2019  . Palliative care by specialist   . Goals of care, counseling/discussion   . Tracheostomy status ([place])   . HCAP (healthcare-associated pneumonia)   . Acute pulmonary edema (HCC)   . Abdominal distension (gaseous)   . Acute on chronic respiratory failure with hypoxia ([place])   . AKI (acute kidney injury) ([place])   . Septic shock ([place])   . CAD (coronary artery disease), native coronary artery 02/09/2019  . 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 ([place])   . Sepsis ([place]) 02/04/2019  . Hypotension 02/04/2019  . Acute respiratory failure with hypoxemia [facility])    Past Medical History:  Past Medical History:  Diagnosis Date  . Coronary artery disease   . Diabetes mellitus without complication ([place])   . Hypertension    Past Surgical History:  Past Surgical History:  Procedure Laterality Date  . CORONARY ANGIOPLASTY WITH STENT PLACEMENT    . CORONARY/GRAFT ACUTE MI REVASCULARIZATION N/A 02/09/2019   Procedure: Coronary/Graft Acute MI Revascularization;  Surgeon: Belva Crome, MD;  Location: [place] [facility];  Service: Cardiovascular;  Laterality: N/A;  . LEFT HEART CATH AND CORONARY ANGIOGRAPHY N/A 02/09/2019   Procedure: LEFT HEART CATH AND CORONARY ANGIOGRAPHY;  Surgeon: Belva Crome, MD;  Location: [place] [facility];  Service: Cardiovascular;  Laterality: N/A;    Assessment / Plan / Recommendation Clinical Impression 73 year old female is visiting from [place].  Noted to have a high fever 103.9 on admit, and reported to have  cough. Taken to [facility] per family. Was supported to have a urinary tract infection although her urine was clean. She never filled her prescription for antibiotics. Due to persistent hypotension and refractory nature of her hypertension she was transferred to [facility] 02/04/2019. She was in respiratory distress for abdominal chest wall paradoxus.  She required extended intubation and central line placement. Pt was transferred to CIR for rehabilitation and seen for evaluation this date. Audio interpreter was utilized during this session, given that pt speaks limited [place]. Primary language is Bengali.   BSE: Pt was observed during breakfast of Dys2 solids and Honey thick liquids. Pt was able to self feed, and was observed taking small bites at a slow rate. Liquids were consumed by teaspoon bolus. Pt exhibits normal oral motor strength and function, and has adequate dentition. No obvious oral deficits or overt s/s aspiration observed during this assessment. Recommend repeat MBS to determine appropriateness to advance diet.  COM/COG: Audio interpreter assisted with administration of the Mini-Mental State Examination (MMSE). Pt reports living alone in [place], and holding a 10th grade education. Pt scored 27/27 on this assessment. Reading and writing tasks not administered due to language barrier. This score indicates functional com/cog status. Interpreter indicated no difficulty understanding pt or communicating with her. Limited English was fully intelligible.  PMSV: Pt currently has a cuffless trach, and was wearing PMSV upon arrival of SLP. Pt was able to communicate verbally, and exhibits a strong cough. She coughed the valve off at one point. Tether was attached to the valve and neck straps  to minimize risk of loss. Pt reported having difficulty breathing, however, O2 sats were steady in the upper 90's throughout SLP assessment (96%-98%). Pt indicated she felt like she had  something in her throat, and wanted that addressed. SLP provided education regarding clear strong voicing and appropriate O2 sats, and reminded pt that the trach was in her throat to assist with her breathing. Pt maintained that she wanted it removed. MD notified. SLP will continue to follow pt for education regarding care and use of PMSV.   Skilled Therapeutic Interventions          Pt was seen for bedside swallow evaluation, PMSV evaluation, and cognitive-linguistic evaluation. Recommend repeat MBS to determine least restrictive diet.    SLP Assessment  Patient will need skilled Speech Language Pathology Services during CIR admission    Recommendations  Patient may use Passy-Muir Speech Valve: During all waking hours (remove during sleep) PMSV Supervision: Intermittent SLP Diet Recommendations: Honey;Dysphagia 2 (Fine chop) Liquid Administration via: Cup;Spoon Medication Administration: Crushed with puree Supervision: Patient able to self feed;Intermittent supervision to cue for compensatory strategies Compensations: Slow rate;Small sips/bites;Minimize environmental distractions Postural Changes and/or Swallow Maneuvers: Seated upright 90 degrees;Out of bed for meals Oral Care Recommendations: Oral care BID Patient destination: Home Follow up Recommendations: (TBD) Equipment Recommended: To be determined    SLP Frequency 3 to 5 out of 7 days   SLP Duration  SLP Intensity  SLP Treatment/Interventions 1-2 weeks  Minumum of 1-2 x/day, 30 minutes/day  Functional tasks;Environmental controls;Dysphagia/aspiration precaution training;Patient/family education    Pain Pain Assessment Pain Scale: 0-10 Pain Score: 0-No pain  Prior Functioning Cognitive/Linguistic Baseline: Within functional limits Type of Home: House  Lives With: Daughter Available Help at Discharge: Family;Available 24 hours/day Education: 10th grade Vocation: Retired  Industrial/product designer Term Goals: Week 1: SLP Short Term  Goal 1 (Week 1): Pt will tolerate mech soft diet and nectar thick liquids without overt s/s aspiration or decline in respiratory status SLP Short Term Goal 2 (Week 1): Pt will tolerate trials of thin liquid with limited bolus size and rate, without overt s/s aspiration or decline in respiratory status. SLP Short Term Goal 3 (Week 1): Pt will demonstrate ability to don/doff and clean PMSV with minA  Refer to Care Plan for Long Term Goals  Recommendations for other services: None   Discharge Criteria: Patient will be discharged from SLP if patient refuses treatment 3 consecutive times without medical reason, if treatment goals not met, if there is a change in medical status, if patient makes no progress towards goals or if patient is discharged from hospital.  The above assessment, treatment plan, treatment alternatives and goals were discussed and mutually agreed upon: by patient   Gabrielle Wright, [facility], CCC-SLP Speech Language Pathologist  Gabrielle Wright 03/28/2019, 11:37 AM

## 2019-03-28 NOTE — Progress Notes (Signed)
[place] PHYSICAL MEDICINE & REHABILITATION PROGRESS NOTE   Subjective/Complaints: Didn't sleep well. Feels that something is stuck in her throat. Wants to know why she needs tube because her oxygen is "100%"  ROS: Limited due to language  Objective:   No results found. Recent Labs    03/26/19 0620 03/28/19 0522  WBC 7.7 7.9  HGB 8.7* 9.3*  HCT 29.4* 30.8*  PLT 279 302   Recent Labs    03/26/19 0620 03/28/19 0522  NA 140 137  K 3.8 4.1  CL 97* 98  CO2 29 28  GLUCOSE 113* 178*  BUN 19 13  CREATININE 1.18* 1.10*  CALCIUM 9.1 9.0    Intake/Output Summary (Last 24 hours) at 03/28/2019 0920 Last data filed at 03/27/2019 1818 Gross per 24 hour  Intake 240 ml  Output -  Net 240 ml     Physical Exam: Vital Signs Blood pressure (!) 125/49, pulse 87, temperature 98.2 F (36.8 C), temperature source Oral, resp. rate 20, height 5' 4.02" (1.626 m), weight 75.6 kg, SpO2 98 %.  General: Alert  HEENT: Head is normocephalic, atraumatic, PERRLA, EOMI, sclera anicteric, oral mucosa pink and moist, dentition intact, ext ear canals clear,  Neck: #4 trach with PMV Heart: Reg rate and rhythm. No murmurs rubs or gallops Chest: CTA bilaterally without wheezes, rales, or rhonchi; no distress Abdomen: Soft, non-tender, non-distended, bowel sounds positive. Extremities: No clubbing, cyanosis, or edema. Pulses are 2+ Skin: Clean and intact without signs of breakdown Neuro: Pt is cognitively appropriate with normal insight, memory, and awareness. Cranial nerves 2-12 are intact. Sensory exam is normal. Reflexes are 2+ in all 4's. Fine motor coordination is intact. No tremors. Motor function is grossly 4/5 throughout Musculoskeletal: Full ROM, No pain with AROM or PROM in the neck, trunk, or extremities. Posture appropriate Psych: anxious      Assessment/Plan:  1. Functional deficits secondary to debility after sepsis which require 3+ hours per day of interdisciplinary therapy in a  comprehensive inpatient rehab setting.  Physiatrist is providing close team supervision and 24 hour management of active medical problems listed below.  Physiatrist and rehab team continue to assess barriers to discharge/monitor patient progress toward functional and medical goals  Care Tool:  Bathing              Bathing assist Assist Level: Minimal Assistance - Patient > 75%     Upper Body Dressing/Undressing Upper body dressing   What is the patient wearing?: Hospital gown only    Upper body assist Assist Level: Minimal Assistance - Patient > 75%    Lower Body Dressing/Undressing Lower body dressing      What is the patient wearing?: Incontinence brief     Lower body assist Assist for lower body dressing: Minimal Assistance - Patient > 75%     Toileting Toileting    Toileting assist Assist for toileting: Minimal Assistance - Patient > 75%     Transfers Chair/bed transfer  Transfers assist     Chair/bed transfer assist level: Minimal Assistance - Patient > 75%     Locomotion Ambulation   Ambulation assist              Walk 10 feet activity   Assist           Walk 50 feet activity   Assist           Walk 150 feet activity   Assist  Walk 10 feet on uneven surface  activity   Assist           Wheelchair     Assist               Wheelchair 50 feet with 2 turns activity    Assist            Wheelchair 150 feet activity     Assist          Medical Problem List and Plan: 1.Debilitysecondary to septic shock/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 Antithrombotics: -DVT/anticoagulation:SCDs -antiplatelet therapy: Aspirin 81 mg daily 3. Pain Management:Tylenol as needed 4. Mood:Zoloft 50 mg daily, trazodone 50 mg nightly, Ativan 0.5 mg as needed -antipsychotic  agents: N/A  -pt needs CONSTANT reassurance, anxiety is high 5. Neuropsych: This patientiscapable of making decisions onherown behalf. 6. Skin/Wound Care:Routine skin checks 7. Fluids/Electrolytes/Nutrition:encourage PO 8.Respiratory failure. 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  -plugging trials during day  -pt needs regular reassurance  -decannulate soon per CCM  -wean oxygen as able 9.Non-STEMI. Continue aspirin. Follow-up cardiology services 10.Acute on chronic combined diastolic congestive heart failure. Continue Lasix 40 mg daily.   -monitor daily weights Filed Weights   03/27/19 1543 03/28/19 0423  Weight: 75.2 kg 75.6 kg    11.Atrial fibrillation with RVR. Amiodarone 200 mg daily. Cardiac rate controlled at present 12.Hypertension. Coreg 3.125 mg twice daily, hydralazine 10 mg twice daily, Imdur 30 mg daily, Cozaar 25 mg daily, Aldactone 12.5 mg daily 13. Dysphagia. Dysphagia #2 honey thick liquids.   -MBS today 14. CKD stage III.   -I personally reviewed the patient's labs today.  Cr 1.1 5/1 15. Acute on chronic anemia. Follow-up hgb 9.3 5/1 16. Diabetes mellitus. Hemoglobin A1c 10.1. Lantus insulin 15 units nightly.    -fair control at present  -monitor for pattern 17. Hyperlipidemia. Lipitor    Over 35 minutes of time spent with pt and in decision making.     LOS: 1 days A FACE TO FACE EVALUATION WAS PERFORMED  Ranelle Oyster 03/28/2019, 9:20 AM

## 2019-03-28 NOTE — Progress Notes (Signed)
Modified Barium Swallow Progress Note  Patient Details  Name: Gabrielle Wright MRN: [id number] Date of Birth: [birth year]  Today's Date: 03/28/2019  Modified Barium Swallow completed.  Full report located under Chart Review in the Imaging Section.  Brief recommendations include the following:  Clinical Impression  Pt demonstrates improvement since prior MBS completed 03/19/2019. Improvements noted in oral and pharyngeal function and safety Oral phase is now within normal limits, with adequate oral prep and no residue across all consistencies. Pharyngeally, pt exhibits delayed swallow reflex, with trigger noted at the pyriform sinus on thin and nectar thick liquids, and at the vallecula on puree, solid, and barium tablet. Pt had difficulty following command to take one small sip of thin liquids, and so exhibited penetration to the vocal cords with no reflexive cough. Pt was able to demonstrate strong volitional cough on command, which cleared penetrate from laryngeal vestibule. Pt was challenged to take multiple boluses of nectar thick liquid via cup and straw, and exhibited intermittent flash penetration during the swallow, which cleared spontaneously and was not aspirated. There was no post-swallow residue on any consistency. Puree, solid, and barium tablet (given with puree) were tolerated well. Recommend advancing diet to mechanical soft with nectar thick liquids, meds whole with puree, intermittent supervision/assist, with PMSV in place during meals. These results were relayed to MD and RN, and safe swallow precautions were posted in pt room along with PMSV use information (wear all waking hours and all po intake).    Swallow Evaluation Recommendations       SLP Diet Recommendations: Dysphagia 3 (Mech soft) solids;Nectar thick liquid   Liquid Administration via: Cup;Straw   Medication Administration: Whole meds with puree   Supervision: Patient able to self feed;Intermittent supervision to cue for  compensatory strategies   Compensations: Slow rate;Small sips/bites;Minimize environmental distractions       Oral Care Recommendations: Oral care BID        Leigh Aurora 03/28/2019,12:23 PM

## 2019-03-28 NOTE — Progress Notes (Signed)
Social Work  Social Work Assessment and Plan  Patient Details  Name: Gabrielle Wright MRN: [id number] Date of Birth: 10/30/1946  Today's Date: 03/28/2019  Problem List:  Patient Active Problem List   Diagnosis Date Noted  . Diabetes mellitus type 2 in obese (HCC)   . Acute on chronic anemia   . Chronic kidney disease (CKD), stage III (moderate) (HCC)   . Dysphagia   . Essential hypertension   . Acute on chronic combined systolic (congestive) and diastolic (congestive) heart failure (HCC)   . Anxiety state   . Debility 03/27/2019  . Palliative care by specialist   . Goals of care, counseling/discussion   . Tracheostomy status (HCC)   . HCAP (healthcare-associated pneumonia)   . Acute pulmonary edema (HCC)   . Abdominal distension (gaseous)   . Acute on chronic respiratory failure with hypoxia (HCC)   . AKI (acute kidney injury) (HCC)   . Septic shock (HCC)   . CAD (coronary artery disease), native coronary artery 02/09/2019  . 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 Sepsis (HCC) 02/04/2019  . Hypotension 02/04/2019  . Acute respiratory failure with hypoxemia [facility])    Past Medical History:  Past Medical History:  Diagnosis Date  . Coronary artery disease   . Diabetes mellitus without complication (HCC)   . Hypertension    Past Surgical History:  Past Surgical History:  Procedure Laterality Date  . CORONARY ANGIOPLASTY WITH STENT PLACEMENT    . CORONARY/GRAFT ACUTE MI REVASCULARIZATION N/A 02/09/2019   Procedure: Coronary/Graft Acute MI Revascularization;  Surgeon: Lyn Records, MD;  Location: [facility] INVASIVE CV LAB;  Service: Cardiovascular;  Laterality: N/A;  . LEFT HEART CATH AND CORONARY ANGIOGRAPHY N/A 02/09/2019   Procedure: LEFT HEART CATH AND CORONARY ANGIOGRAPHY;  Surgeon: Lyn Records, MD;  Location: [facility] INVASIVE CV LAB;  Service: Cardiovascular;  Laterality: N/A;   Social History:  reports that she has never  smoked. She has never used smokeless tobacco. She reports that she does not drink alcohol or use drugs.  Family / Support Systems Marital Status: Widow/Widower How Long?: 2017 Patient Roles: Parent, Other (Comment)(grandparent) Children: daughter, Gabrielle Wright @ [phone] - living in [place].  Another daughter living in [place]. Anticipated Caregiver: daughter, Gabrielle Wright Caregiver Availability: 24/7 Family Dynamics: Daughter reports that she will be primary caregiver to mother at d/c.    Social History Preferred language: Bengali Religion: Muslim Cultural Background: Pt from [place] and divides her year between [place] (with other daughter) and [place] Read: Yes(in her native language) Write: Yes Employment Status: Retired Marine scientist Issues: None Guardian/Conservator: None - per MD, pt is capable of making decisions on her own behalf.   Abuse/Neglect Abuse/Neglect Assessment Can Be Completed: Yes Physical Abuse: Denies Verbal Abuse: Denies Sexual Abuse: Denies Exploitation of patient/patient's resources: Denies Self-Neglect: Denies  Emotional Status Pt's affect, behavior and adjustment status: Pt sitting up in w/c and was able to briefly introduce myself while she was on the phone with daughter living in [place].  Attempted to utilize [employer] service, however, this would not open up so I gathered information from local daughter via phone.  Therapies and MD describing pt as "very anxious" and repeats questions/ concerns.  Very anxious about her trach.  Will refer for neuropsychology if translation equipment is working.Marland Kitchen Recent Psychosocial Issues: None Psychiatric History: none Substance Abuse History: none  Patient / Family Perceptions, Expectations & Goals Pt/Family understanding of  illness & functional limitations: Pt and family with very basic understanding of her debility following respiratory failure, trach and need for CIR. Premorbid pt/family  roles/activities: Pt was indepedent PTA. Anticipated changes in roles/activities/participation: Per goals of supervision, daughter to assume primary caregiver role. Pt/family expectations/goals: "I want this out (trach)."  [employer]: None Premorbid Home Care/DME Agencies: None Transportation available at discharge: yes Resource referrals recommended: Neuropsychology  Discharge Planning Living Arrangements: Children Support Systems: Children, Other relatives Type of Residence: Private residence Insurance Resources: [employer] (specify county) Architect: [employer]SI, Family Youth worker Screen Referred: No Living Expenses: Lives with family Money Management: Family Does the patient have any problems obtaining your medications?: No Home Management: pt/ family Patient/Family Preliminary Plans: Pt to return home with daughter/ family in [place].  Daughter not currently working due to [employer] and can provide 24/7 supervision. Social Work Anticipated Follow Up Needs: HH/OP Expected length of stay: 7-10 days  Clinical Impression Pleasant woman who is from [place] and needs interpreter assistance - here for debility following resp failure and now with trach which is causing her much anxiety.  Living with local daughter and her family with plan that dtr to provide 24/7 supervision at d/c.  TEam has set supervision goals and hopeful she will be able to d/c trach by time of d/c per MD.  Will follow for support and d/c planning needs.  Alisia Vanengen 03/28/2019, 4:33 PM

## 2019-03-28 NOTE — Progress Notes (Signed)
Occupational Therapy Session Note  Patient Details  Name: Zierra Laroque MRN: [id number] Date of Birth: 1946/11/09  Today's Date: 03/28/2019 OT Individual Time: 1430-1500 OT Individual Time Calculation (min): 30 min    Short Term Goals: Week 1:  OT Short Term Goal 1 (Week 1): STG = LTG due to ELOS  Skilled Therapeutic Interventions/Progress Updates:  1:1. Pt utilized teleinterpreter throughout session. Pt completes toileting via stand pivo to [facility] with CGA. Pt able to manage clothing. Pt requires VC for using interpreter to ask for needs instead of motioning. Pt completes bathing with A to wash B feet and min A to steady to wash buttocks. Pt dons hospital gown and OT dons B socks. Pt uses suction toothbrush with VC for suction feature. Exited session with chair alarm on, call light in reach and all needs met   Therapy Documentation Precautions:  Precautions Precautions: Fall Restrictions Weight Bearing Restrictions: No General:   Vital Signs: Therapy Vitals Temp: 98 F (36.7 C) Temp Source: Oral Pulse Rate: 76 Resp: 19 BP: (!) 103/48 Patient Position (if appropriate): Sitting Oxygen Therapy SpO2: 100 % O2 Device: Tracheostomy Collar O2 Flow Rate (L/min): 3 L/min FiO2 (%): 28 % Pain: Pain Assessment Pain Scale: 0-10 Pain Score: 0-No pain ADL:   Vision Baseline Vision/History: Wears glasses;Cataracts(had cataract surgery on one eye) Wears Glasses: At all times Vision Assessment?: No apparent visual deficits Perception    Praxis   Exercises:   Other Treatments:     Therapy/Group: Individual Therapy  Tonny Branch 03/28/2019, 3:01 PM

## 2019-03-28 NOTE — Progress Notes (Signed)
Patient information reviewed and entered into [facility] System by Becky Adekunle Rohrbach, PPS coordinator. Information including medical coding, function ability, and quality indicators will be reviewed and updated through discharge.   

## 2019-03-29 ENCOUNTER — Inpatient Hospital Stay (HOSPITAL_COMMUNITY): Payer: Medicaid Other | Admitting: Speech Pathology

## 2019-03-29 ENCOUNTER — Inpatient Hospital Stay (HOSPITAL_COMMUNITY): Payer: Medicaid Other

## 2019-03-29 ENCOUNTER — Inpatient Hospital Stay (HOSPITAL_COMMUNITY): Payer: Medicaid Other | Admitting: Physical Therapy

## 2019-03-29 DIAGNOSIS — R1310 Dysphagia, unspecified: Secondary | ICD-10-CM

## 2019-03-29 DIAGNOSIS — N183 Chronic kidney disease, stage 3 unspecified: Secondary | ICD-10-CM

## 2019-03-29 DIAGNOSIS — I5043 Acute on chronic combined systolic (congestive) and diastolic (congestive) heart failure: Secondary | ICD-10-CM

## 2019-03-29 DIAGNOSIS — F411 Generalized anxiety disorder: Secondary | ICD-10-CM

## 2019-03-29 DIAGNOSIS — E669 Obesity, unspecified: Secondary | ICD-10-CM

## 2019-03-29 DIAGNOSIS — D649 Anemia, unspecified: Secondary | ICD-10-CM

## 2019-03-29 DIAGNOSIS — E1169 Type 2 diabetes mellitus with other specified complication: Secondary | ICD-10-CM

## 2019-03-29 DIAGNOSIS — E119 Type 2 diabetes mellitus without complications: Secondary | ICD-10-CM

## 2019-03-29 DIAGNOSIS — I10 Essential (primary) hypertension: Secondary | ICD-10-CM

## 2019-03-29 LAB — GLUCOSE, CAPILLARY
Glucose-Capillary: 173 mg/dL — ABNORMAL HIGH (ref 70–99)
Glucose-Capillary: 166 mg/dL — ABNORMAL HIGH (ref 70–99)
Glucose-Capillary: 237 mg/dL — ABNORMAL HIGH (ref 70–99)
Glucose-Capillary: 180 mg/dL — ABNORMAL HIGH (ref 70–99)

## 2019-03-29 MED ORDER — ALUM & MAG HYDROXIDE-SIMETH 200-200-20 MG/5ML PO SUSP
30.0000 mL | Freq: Four times a day (QID) | ORAL | Status: DC | PRN
  Administered 2019-03-29 – 2019-04-04 (×2): 30 mL via ORAL
  Filled 2019-03-29 (×2): qty 30

## 2019-03-29 NOTE — Progress Notes (Signed)
[place] PHYSICAL MEDICINE & REHABILITATION PROGRESS NOTE   Subjective/Complaints: Patient seen laying in bed this morning.  She did not sleep well overnight due to need for being suctioned.  Discussed with nurse tech.  ROS: Limited due to language  Objective:   Dg Swallowing Func-speech Pathology  Result Date: 03/28/2019 Objective Swallowing Evaluation: Type of Study: MBS-Modified Barium Swallow Study  Patient Details Name: Gabrielle Wright MRN: [id number] Date of Birth: March 07, 1946 Today's Date: 03/28/2019 Time: SLP Start Time (ACUTE ONLY): 1135 -SLP Stop Time (ACUTE ONLY): 1150 SLP Time Calculation (min) (ACUTE ONLY): 15 min Past Medical History: Past Medical History: Diagnosis Date . Coronary artery disease  . Diabetes mellitus without complication (HCC)  . Hypertension  Past Surgical History: Past Surgical History: Procedure Laterality Date . CORONARY ANGIOPLASTY WITH STENT PLACEMENT   . CORONARY/GRAFT ACUTE MI REVASCULARIZATION N/A 02/09/2019  Procedure: Coronary/Graft Acute MI Revascularization;  Surgeon: Lyn Records, MD;  Location: [facility] INVASIVE CV LAB;  Service: Cardiovascular;  Laterality: N/A; . LEFT HEART CATH AND CORONARY ANGIOGRAPHY N/A 02/09/2019  Procedure: LEFT HEART CATH AND CORONARY ANGIOGRAPHY;  Surgeon: Lyn Records, MD;  Location: [facility] INVASIVE CV LAB;  Service: Cardiovascular;  Laterality: N/A; HPI: 73 y.o. female admitted on 02/03/19 for fever and cough (to [facility]) and was transferred to [facility] on 02/04/19 for hypotension and respiratory distress (acute respiratory failure/pulmonary edema) requiring intubation 3/10.  She was visiting family from [place] and COVID19 tests were negative.  She was found to have group A strep bacteremia (septic shock), and rhinovirus. Course complicated by NSTEMI and new LV dysfunction s/p cardiac cath on 02/09/19.  Other dx include ischemic cardiomyopathy, hyperkalemia, DM2- uncontrolled, and abdominal distention.  Pt with significant PMH of DM,  HTN, anc CAD. Trach placed on 3/31.  Subjective: pt alert, anxious Assessment / Plan / Recommendation CHL IP CLINICAL IMPRESSIONS 03/28/2019 Clinical Impression Pt demonstrates improvement since prior MBS completed 03/19/2019. Improvements noted in oral and pharyngeal function and safety Oral phase is now within normal limits, with adequate oral prep and no residue across all consistencies. Pharyngeally, pt exhibits delayed swallow reflex, with trigger noted at the pyriform sinus on thin and nectar thick liquids, and at the vallecula on puree, solid, and barium tablet. Pt had difficulty following command to take one small sip of thin liquids, and so exhibited penetration to the vocal cords with no reflexive cough. Pt was able to demonstrate strong volitional cough on command, which cleared penetrate from laryngeal vestibule. Pt was challenged to take multiple boluses of nectar thick liquid via cup and straw, and exhibited intermittent flash penetration during the swallow, which cleared spontaneously and was not aspirated. There was no post-swallow residue on any consistency. Puree, solid, and barium tablet (given with puree) were tolerated well. Recommend advancing diet to mechanical soft with nectar thick liquids, meds whole with puree, intermittent supervision/assist, with PMSV in place during meals. These results were relayed to MD and RN, and safe swallow precautions were posted in pt room along with PMSV use information (wear all waking hours and all po intake).  SLP Visit Diagnosis Dysphagia, pharyngeal phase (R13.13) Attention and concentration deficit following -- Frontal lobe and executive function deficit following -- Impact on safety and function Mild aspiration risk;Moderate aspiration risk   CHL IP TREATMENT RECOMMENDATION 03/28/2019 Treatment Recommendations Therapy as outlined in treatment plan below   Prognosis 03/28/2019 Prognosis for Safe Diet Advancement Good Barriers to Reach Goals (No Data)  Barriers/Prognosis Comment -- CHL IP DIET  RECOMMENDATION 03/28/2019 SLP Diet Recommendations Dysphagia 3 (Mech soft) solids;Nectar thick liquid Liquid Administration via Cup;Straw Medication Administration Whole meds with puree Compensations Slow rate;Small sips/bites;Minimize environmental distractions Postural Changes --   CHL IP OTHER RECOMMENDATIONS 03/28/2019 Recommended Consults -- Oral Care Recommendations Oral care BID Other Recommendations --   CHL IP FOLLOW UP RECOMMENDATIONS 03/28/2019 Follow up Recommendations None   CHL IP FREQUENCY AND DURATION 03/19/2019 Speech Therapy Frequency (ACUTE ONLY) min 2x/week Treatment Duration 2 weeks      CHL IP ORAL PHASE 03/28/2019 Oral Phase WFL Oral - Pudding Teaspoon -- Oral - Pudding Cup -- Oral - Honey Teaspoon -- Oral - Honey Cup -- Oral - Nectar Teaspoon -- Oral - Nectar Cup -- Oral - Nectar Straw -- Oral - Thin Teaspoon -- Oral - Thin Cup -- Oral - Thin Straw -- Oral - Puree -- Oral - Mech Soft -- Oral - Regular -- Oral - Multi-Consistency -- Oral - Pill -- Oral Phase - Comment --  CHL IP PHARYNGEAL PHASE 03/28/2019 Pharyngeal Phase Impaired Pharyngeal- Pudding Teaspoon -- Pharyngeal -- Pharyngeal- Pudding Cup -- Pharyngeal -- Pharyngeal- Honey Teaspoon NT Pharyngeal -- Pharyngeal- Honey Cup NT Pharyngeal -- Pharyngeal- Nectar Teaspoon Delayed swallow initiation-pyriform sinuses Pharyngeal -- Pharyngeal- Nectar Cup Delayed swallow initiation-pyriform sinuses;Penetration/Aspiration during swallow Pharyngeal Material does not enter airway;Material enters airway, remains ABOVE vocal cords then ejected out Pharyngeal- Nectar Straw -- Pharyngeal -- Pharyngeal- Thin Teaspoon Delayed swallow initiation-pyriform sinuses Pharyngeal -- Pharyngeal- Thin Cup Delayed swallow initiation-pyriform sinuses;Penetration/Aspiration during swallow Pharyngeal Material enters airway, CONTACTS cords and not ejected out Pharyngeal- Thin Straw -- Pharyngeal -- Pharyngeal- Puree Delayed swallow  initiation-vallecula Pharyngeal -- Pharyngeal- Mechanical Soft Delayed swallow initiation-vallecula Pharyngeal -- Pharyngeal- Regular -- Pharyngeal -- Pharyngeal- Multi-consistency -- Pharyngeal -- Pharyngeal- Pill Delayed swallow initiation-vallecula Pharyngeal -- Pharyngeal Comment --  CHL IP CERVICAL ESOPHAGEAL PHASE 03/19/2019 Cervical Esophageal Phase WFL Pudding Teaspoon -- Pudding Cup -- Honey Teaspoon -- Honey Cup -- Nectar Teaspoon -- Nectar Cup -- Nectar Straw -- Thin Teaspoon -- Thin Cup -- Thin Straw -- Puree -- Mechanical Soft -- Regular -- Multi-consistency -- Pill -- Cervical Esophageal Comment -- Leigh Aurora 03/28/2019, 12:24 PM              Recent Labs    03/28/19 0522  WBC 7.9  HGB 9.3*  HCT 30.8*  PLT 302   Recent Labs    03/28/19 0522  NA 137  K 4.1  CL 98  CO2 28  GLUCOSE 178*  BUN 13  CREATININE 1.10*  CALCIUM 9.0   No intake or output data in the 24 hours ending 03/29/19 0911   Physical Exam: Vital Signs Blood pressure 122/65, pulse 85, temperature 98.4 F (36.9 C), resp. rate 18, height 5' 4.02" (1.626 m), weight 75.6 kg, SpO2 98 %. Constitutional: No distress . Vital signs reviewed. HENT: Normocephalic.  Atraumatic. Neck: #4 trach Eyes: EOMI. No discharge. Cardiovascular: No JVD. Respiratory: Increased work of breathing GI: Non-distended. Musc: No edema or tenderness in extremities. Neuro: Alert Motor: Grossly 4-/5 throughout Psych: Anxious  Assessment/Plan:  1. Functional deficits secondary to debility after sepsis which require 3+ hours per day of interdisciplinary therapy in a comprehensive inpatient rehab setting.  Physiatrist is providing close team supervision and 24 hour management of active medical problems listed below.  Physiatrist and rehab team continue to assess barriers to discharge/monitor patient progress toward functional and medical goals  Care Tool:  Bathing  Bathing activity did not occur: Refused Body  parts bathed  by patient: Right arm, Left arm, Chest, Abdomen, Front perineal area, Buttocks, Right upper leg, Left upper leg, Face   Body parts bathed by helper: Right lower leg, Left lower leg     Bathing assist Assist Level: Minimal Assistance - Patient > 75%     Upper Body Dressing/Undressing Upper body dressing   What is the patient wearing?: Hospital gown only    Upper body assist Assist Level: Minimal Assistance - Patient > 75%    Lower Body Dressing/Undressing Lower body dressing      What is the patient wearing?: Incontinence brief     Lower body assist Assist for lower body dressing: Minimal Assistance - Patient > 75%     Toileting Toileting    Toileting assist Assist for toileting: Minimal Assistance - Patient > 75%     Transfers Chair/bed transfer  Transfers assist     Chair/bed transfer assist level: Minimal Assistance - Patient > 75%     Locomotion Ambulation   Ambulation assist      Assist level: Minimal Assistance - Patient > 75% Assistive device: No Device Max distance: 15 ft   Walk 10 feet activity   Assist     Assist level: Minimal Assistance - Patient > 75% Assistive device: No Device   Walk 50 feet activity   Assist Walk 50 feet with 2 turns activity did not occur: Safety/medical concerns         Walk 150 feet activity   Assist Walk 150 feet activity did not occur: Safety/medical concerns         Walk 10 feet on uneven surface  activity   Assist Walk 10 feet on uneven surfaces activity did not occur: Safety/medical concerns         Wheelchair     Assist Will patient use wheelchair at discharge?: No Type of Wheelchair: Manual    Wheelchair assist level: Moderate Assistance - Patient 50 - 74% Max wheelchair distance: 30 ft     Wheelchair 50 feet with 2 turns activity    Assist    Wheelchair 50 feet with 2 turns activity did not occur: Safety/medical concerns       Wheelchair 150 feet activity      Assist Wheelchair 150 feet activity did not occur: Safety/medical concerns        Medical Problem List and Plan: 1.Debilitysecondary to septic shock/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 Antithrombotics: -DVT/anticoagulation:SCDs -antiplatelet therapy: Aspirin 81 mg daily 3. Pain Management:Tylenol as needed 4. Mood:Zoloft 50 mg daily, trazodone 50 mg nightly, Ativan 0.5 mg as needed -antipsychotic agents: N/A  -pt needs significant reassurance, anxiety is high 5. Neuropsych: This patientiscapable of making decisions onherown behalf. 6. Skin/Wound Care:Routine skin checks 7. Fluids/Electrolytes/Nutrition:encourage PO 8.Respiratory failure. 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  -plugging trials during day  -pt needs regular reassurance  -decannulate soon per CCM  -wean supplemental oxygen as tolerated    Requires frequent suctioning- anxiety component 9.Non-STEMI. Continue aspirin. Follow-up cardiology services 10.Acute on chronic combined congestive heart failure. Continue Lasix 40 mg daily.   Filed Weights   03/27/19 1543 03/28/19 0423  Weight: 75.2 kg 75.6 kg   Stable on 5/1 11.Atrial fibrillation with RVR. Amiodarone 200 mg daily. Cardiac rate controlled at present 12.Hypertension. Coreg 3.125 mg twice daily, hydralazine 10 mg twice daily, Imdur 30 mg daily, Cozaar 25 mg daily, Aldactone 12.5 mg daily  Relatively controlled on 5/2 13. Dysphagia.  Dysphagia #2 honey thick liquids, advanced to D3 nectars  Further advance as tolerated 14. CKD stage III.   Creatinine 1.10 on 5/1  Continue to monitor 15. Acute on chronic anemia.   Hemoglobin 9.3 on 5/1  Continue to monitor 16. Diabetes mellitus. Hemoglobin A1c 10.1. Lantus insulin 15 units nightly.    Labile on 5/2, monitor for trend 17. Hyperlipidemia.  Lipitor   LOS: 2 days A FACE TO FACE EVALUATION WAS PERFORMED   Karis Juba 03/29/2019, 9:11 AM

## 2019-03-29 NOTE — Progress Notes (Signed)
Occupational Therapy Session Note  Patient Details  Name: Gabrielle Wright MRN: [id number] Date of Birth: Dec 07, 1945  Today's Date: 03/29/2019 OT Individual Time: 3570-1779 and 700-715 OT Individual Time Calculation (min): 43 min    Short Term Goals: Week 1:  OT Short Term Goal 1 (Week 1): STG = LTG due to ELOS  Skilled Therapeutic Interventions/Progress Updates:    1:1. Pt received seated EOB with NT present. Tablet interpreter not plugged in, therefore not charged (OT charges for next session). Pt visibly upset with high RR d/t anxiety around mucus in trachea. OT demo coughing with suction in hand for external suctioning however pt shake head no and motion for dep suction. RN just deep suctioned and demo coughing (pt attempts to cough and clears mucus that OT suctions externally). Pt pointing to recliner and OT provides CGA to transfer to recliner with [place] A for safe reach back. Pt shaking head no to toilet and shaking hands "no" to any other activity. RR decreased after coughing. Exited session with pt in recliner, call light in reach and all needs met  Session 2: 43 min Pt received on commode with RN in room. Pt finishing voiding BM and standing posterior hygiene/clothing management with MIN A. Pt received clothing from daughter and agreeable to change out of hospital gown. Pt requires A to thread BLE into pants despite VC for techniques (using tele interpreter). Pt perseverates on OT triming nails despite OT explaining she is unable to d/t risk of cutting/infection. Pt able to don pull over shirt with S. Pt completes grooming with suction tooth brush with supervision and face washing with set up. Pt requires VC for using interpreter instead of gesturing when asking for items. Pt demo learned helpless refusing to brush hair and insisting OT complete task. As OT leaves room, pt trimming nails on her own seated in recliner, call lightin reach and exit alarm on Therapy Documentation Precautions:   Precautions Precautions: Fall Precaution Comments: trach collar Restrictions Weight Bearing Restrictions: No General:   Vital Signs: Therapy Vitals Temp: 98.4 F (36.9 C) Pulse Rate: 85 Resp: 18 BP: 122/65 Patient Position (if appropriate): Sitting Oxygen Therapy SpO2: 99 % O2 Device: Nasal Cannula O2 Flow Rate (L/min): 5 L/min Pain: Pain Assessment Pain Scale: Faces Faces Pain Scale: Hurts little more Pain Location: Head Pain Descriptors / Indicators: Aching Pain Intervention(s): Medication (See eMAR) ADL:   Vision   Perception    Praxis   Exercises:   Other Treatments:     Therapy/Group: Individual Therapy  Tonny Branch 03/29/2019, 7:17 AM

## 2019-03-29 NOTE — Progress Notes (Signed)
Physical Therapy Session Note  Patient Details  Name: Gabrielle Wright MRN: [id number] Date of Birth: 03-14-1946  Today's Date: 03/29/2019 PT Individual Time: 1600-1701 PT Individual Time Calculation (min): 61 min   Short Term Goals: Week 1:  PT Short Term Goal 1 (Week 1): STG = LTG due to short ELOS.  Skilled Therapeutic Interventions/Progress Updates:    [employer] Interpreter [id number]) used throughout session. Pt received supine in bed and agreeable to therapy session. Pt maintained on 3L of O2 FiO2 28% via trach collar throughout session with SpO2 monitored and maintained at 100% with activity. Pt reports feeling "weak" throughout session requiring increased time for rest breaks. Supine to sit, HOB elevated and using bedrails, with close supervision for safety. Performed stand pivot transfer EOB to w/c with min assist for balance. Pt transported in w/c to ortho gym for time management and energy conservation. Performed stand pivot car transfer, no AD, with min assist for balance and eccentric descent into car. Pt ambulated ~53ft, no AD and intermittent R HHA, x2 (seated break between) with min assist for balance throughout - pt demonstrated increased respiratory rate at end of walk with cuing for slow, deep breaths. Pt having productive cough frequently throughout session. Pt transported back to room and requesting to sit in recliner. Performed stand pivot transfer, R HHA, w/c to recliner with min assist for balance. Pt educated on importance of continuing to increase activity level daily for improved endurance. Pt left sitting in recliner with needs in reach, lines intact, and chair alarm on.   Therapy Documentation Precautions:  Precautions Precautions: Fall Precaution Comments: trach collar Restrictions Weight Bearing Restrictions: No  Pain: Reports throat pain during session with RN present and aware.    Therapy/Group: Individual Therapy  Ginny Forth, PT, DPT 03/29/2019, 3:59 PM

## 2019-03-29 NOTE — Progress Notes (Signed)
Speech Language Pathology Daily Session Note  Patient Details  Name: Gabrielle Wright MRN: [id number] Date of Birth: 1946/08/12  Today's Date: 03/29/2019 SLP Individual Time: 0720-0815 SLP Individual Time Calculation (min): 55 min  Short Term Goals: Week 1: SLP Short Term Goal 1 (Week 1): Pt will tolerate mech soft diet and nectar thick liquids without overt s/s aspiration or decline in respiratory status SLP Short Term Goal 2 (Week 1): Pt will tolerate trials of thin liquid with limited bolus size and rate, without overt s/s aspiration or decline in respiratory status. SLP Short Term Goal 3 (Week 1): Pt will demonstrate ability to don/doff and clean PMSV with minA  Skilled Therapeutic Interventions: Skilled treatment session focused on communication and dysphagia goals.  Upon arrival, patient was upright in the recliner and appeared uncomfortable and anxious while attempting to cough up secretions without the plug or PMSV in place. SLP donned PMSV which allowed increased strength for coughing and ability to clear secretions. Video interpreter was used and patient reported discomfort due to coughing. SLP provided emotional support and educated on deep, diaphragmatic breathing. Patient agreeable to consume her breakfast meal of Dys. 3 textures with nectar-thick liquids. Patient consumed meal without overt s/s of aspiration with intermittent supervision verbal cues needed for small bites. Recommend patient continue current diet. PMSV remained in place throughout session with all vitals remaining WFL. Patient left upright in recliner with alarm on and all needs within reach. Continue with current plan of care.      Pain Pain Assessment Pain Scale: Faces Pain Score: 0-No pain  Therapy/Group: Individual Therapy  Gabrielle Wright 03/29/2019, 2:27 PM

## 2019-03-29 NOTE — Progress Notes (Signed)
RN called Respiratory therapist to clarify orders for red cap,as per RT pt. Did not tolerated well yesterday so he considered that the pt. Is not ready for it.Keep assessing pt.'s needs closely.

## 2019-03-30 ENCOUNTER — Inpatient Hospital Stay (HOSPITAL_COMMUNITY): Payer: Medicaid Other

## 2019-03-30 LAB — GLUCOSE, CAPILLARY
Glucose-Capillary: 121 mg/dL — ABNORMAL HIGH (ref 70–99)
Glucose-Capillary: 208 mg/dL — ABNORMAL HIGH (ref 70–99)
Glucose-Capillary: 125 mg/dL — ABNORMAL HIGH (ref 70–99)
Glucose-Capillary: 170 mg/dL — ABNORMAL HIGH (ref 70–99)

## 2019-03-30 MED ORDER — GUAIFENESIN 100 MG/5ML PO SOLN
5.0000 mL | Freq: Three times a day (TID) | ORAL | Status: DC | PRN
  Administered 2019-03-30 – 2019-04-03 (×4): 100 mg via ORAL
  Filled 2019-03-30 (×4): qty 5

## 2019-03-30 MED ORDER — GUAIFENESIN ER 600 MG PO TB12
600.0000 mg | ORAL_TABLET | Freq: Two times a day (BID) | ORAL | Status: DC
  Administered 2019-03-30 – 2019-04-05 (×13): 600 mg via ORAL
  Filled 2019-03-30 (×13): qty 1

## 2019-03-30 NOTE — Progress Notes (Signed)
[facility] PHYSICAL MEDICINE & REHABILITATION PROGRESS NOTE   Subjective/Complaints: Patient seen sitting up in her chair this morning.  She appears to indicate that she did not sleep well overnight.  Yesterday informed by nursing regarding significant anxiety complains of reflux.  PRN meds offered.  Patient appears to be coughing, encourage patient to suction, however she called for the nurse.  Discussed with nursing regarding secretions.  ROS: Limited due to language  Objective:   Dg Swallowing Func-speech Pathology  Result Date: 03/28/2019 Objective Swallowing Evaluation: Type of Study: MBS-Modified Barium Swallow Study  Patient Details Name: Gabrielle Wright MRN: [id number] Date of Birth: 14-Nov-1946 Today's Date: 03/28/2019 Time: SLP Start Time (ACUTE ONLY): 1135 -SLP Stop Time (ACUTE ONLY): 1150 SLP Time Calculation (min) (ACUTE ONLY): 15 min Past Medical History: Past Medical History: Diagnosis Date . Coronary artery disease  . Diabetes mellitus without complication (HCC)  . Hypertension  Past Surgical History: Past Surgical History: Procedure Laterality Date . CORONARY ANGIOPLASTY WITH STENT PLACEMENT   . CORONARY/GRAFT ACUTE MI REVASCULARIZATION N/A 02/09/2019  Procedure: Coronary/Graft Acute MI Revascularization;  Surgeon: Lyn Records, MD;  Location: [facility] INVASIVE CV LAB;  Service: Cardiovascular;  Laterality: N/A; . LEFT HEART CATH AND CORONARY ANGIOGRAPHY N/A 02/09/2019  Procedure: LEFT HEART CATH AND CORONARY ANGIOGRAPHY;  Surgeon: Lyn Records, MD;  Location: [facility] INVASIVE CV LAB;  Service: Cardiovascular;  Laterality: N/A; HPI: 73 y.o. female admitted on 02/03/19 for fever and cough (to [facility]) and was transferred to [facility] on 02/04/19 for hypotension and respiratory distress (acute respiratory failure/pulmonary edema) requiring intubation 3/10.  She was visiting family from [place] and COVID19 tests were negative.  She was found to have group A strep bacteremia (septic shock), and  rhinovirus. Course complicated by NSTEMI and new LV dysfunction s/p cardiac cath on 02/09/19.  Other dx include ischemic cardiomyopathy, hyperkalemia, DM2- uncontrolled, and abdominal distention.  Pt with significant PMH of DM, HTN, anc CAD. Trach placed on 3/31.  Subjective: pt alert, anxious Assessment / Plan / Recommendation CHL IP CLINICAL IMPRESSIONS 03/28/2019 Clinical Impression Pt demonstrates improvement since prior MBS completed 03/19/2019. Improvements noted in oral and pharyngeal function and safety Oral phase is now within normal limits, with adequate oral prep and no residue across all consistencies. Pharyngeally, pt exhibits delayed swallow reflex, with trigger noted at the pyriform sinus on thin and nectar thick liquids, and at the vallecula on puree, solid, and barium tablet. Pt had difficulty following command to take one small sip of thin liquids, and so exhibited penetration to the vocal cords with no reflexive cough. Pt was able to demonstrate strong volitional cough on command, which cleared penetrate from laryngeal vestibule. Pt was challenged to take multiple boluses of nectar thick liquid via cup and straw, and exhibited intermittent flash penetration during the swallow, which cleared spontaneously and was not aspirated. There was no post-swallow residue on any consistency. Puree, solid, and barium tablet (given with puree) were tolerated well. Recommend advancing diet to mechanical soft with nectar thick liquids, meds whole with puree, intermittent supervision/assist, with PMSV in place during meals. These results were relayed to MD and RN, and safe swallow precautions were posted in pt room along with PMSV use information (wear all waking hours and all po intake).  SLP Visit Diagnosis Dysphagia, pharyngeal phase (R13.13) Attention and concentration deficit following -- Frontal lobe and executive function deficit following -- Impact on safety and function Mild aspiration risk;Moderate  aspiration risk   CHL IP TREATMENT  RECOMMENDATION 03/28/2019 Treatment Recommendations Therapy as outlined in treatment plan below   Prognosis 03/28/2019 Prognosis for Safe Diet Advancement Good Barriers to Reach Goals (No Data) Barriers/Prognosis Comment -- CHL IP DIET RECOMMENDATION 03/28/2019 SLP Diet Recommendations Dysphagia 3 (Mech soft) solids;Nectar thick liquid Liquid Administration via Cup;Straw Medication Administration Whole meds with puree Compensations Slow rate;Small sips/bites;Minimize environmental distractions Postural Changes --   CHL IP OTHER RECOMMENDATIONS 03/28/2019 Recommended Consults -- Oral Care Recommendations Oral care BID Other Recommendations --   CHL IP FOLLOW UP RECOMMENDATIONS 03/28/2019 Follow up Recommendations None   CHL IP FREQUENCY AND DURATION 03/19/2019 Speech Therapy Frequency (ACUTE ONLY) min 2x/week Treatment Duration 2 weeks      CHL IP ORAL PHASE 03/28/2019 Oral Phase WFL Oral - Pudding Teaspoon -- Oral - Pudding Cup -- Oral - Honey Teaspoon -- Oral - Honey Cup -- Oral - Nectar Teaspoon -- Oral - Nectar Cup -- Oral - Nectar Straw -- Oral - Thin Teaspoon -- Oral - Thin Cup -- Oral - Thin Straw -- Oral - Puree -- Oral - Mech Soft -- Oral - Regular -- Oral - Multi-Consistency -- Oral - Pill -- Oral Phase - Comment --  CHL IP PHARYNGEAL PHASE 03/28/2019 Pharyngeal Phase Impaired Pharyngeal- Pudding Teaspoon -- Pharyngeal -- Pharyngeal- Pudding Cup -- Pharyngeal -- Pharyngeal- Honey Teaspoon NT Pharyngeal -- Pharyngeal- Honey Cup NT Pharyngeal -- Pharyngeal- Nectar Teaspoon Delayed swallow initiation-pyriform sinuses Pharyngeal -- Pharyngeal- Nectar Cup Delayed swallow initiation-pyriform sinuses;Penetration/Aspiration during swallow Pharyngeal Material does not enter airway;Material enters airway, remains ABOVE vocal cords then ejected out Pharyngeal- Nectar Straw -- Pharyngeal -- Pharyngeal- Thin Teaspoon Delayed swallow initiation-pyriform sinuses Pharyngeal -- Pharyngeal- Thin Cup  Delayed swallow initiation-pyriform sinuses;Penetration/Aspiration during swallow Pharyngeal Material enters airway, CONTACTS cords and not ejected out Pharyngeal- Thin Straw -- Pharyngeal -- Pharyngeal- Puree Delayed swallow initiation-vallecula Pharyngeal -- Pharyngeal- Mechanical Soft Delayed swallow initiation-vallecula Pharyngeal -- Pharyngeal- Regular -- Pharyngeal -- Pharyngeal- Multi-consistency -- Pharyngeal -- Pharyngeal- Pill Delayed swallow initiation-vallecula Pharyngeal -- Pharyngeal Comment --  CHL IP CERVICAL ESOPHAGEAL PHASE 03/19/2019 Cervical Esophageal Phase WFL Pudding Teaspoon -- Pudding Cup -- Honey Teaspoon -- Honey Cup -- Nectar Teaspoon -- Nectar Cup -- Nectar Straw -- Thin Teaspoon -- Thin Cup -- Thin Straw -- Puree -- Mechanical Soft -- Regular -- Multi-consistency -- Pill -- Cervical Esophageal Comment -- Leigh Aurora 03/28/2019, 12:24 PM              Recent Labs    03/28/19 0522  WBC 7.9  HGB 9.3*  HCT 30.8*  PLT 302   Recent Labs    03/28/19 0522  NA 137  K 4.1  CL 98  CO2 28  GLUCOSE 178*  BUN 13  CREATININE 1.10*  CALCIUM 9.0    Intake/Output Summary (Last 24 hours) at 03/30/2019 0940 Last data filed at 03/30/2019 0745 Gross per 24 hour  Intake 480 ml  Output 1 ml  Net 479 ml     Physical Exam: Vital Signs Blood pressure (!) 124/53, pulse 85, temperature 98 F (36.7 C), temperature source Oral, resp. rate 16, height 5' 4.02" (1.626 m), weight 75.6 kg, SpO2 100 %. Constitutional: No distress . Vital signs reviewed. HENT: Normal cephalic.  Atraumatic. Neck: #4 trach Eyes: EOMI.  No discharge. Cardiovascular: No JVD. Respiratory: Increased work of breathing GI: Non-distended. Musc: No edema or tenderness in extremities. Neuro: Alert Motor: Grossly 4-/5 throughout Psych: Very anxious  Assessment/Plan:  1. Functional deficits secondary to debility after sepsis which require  3+ hours per day of interdisciplinary therapy in a comprehensive  inpatient rehab setting.  Physiatrist is providing close team supervision and 24 hour management of active medical problems listed below.  Physiatrist and rehab team continue to assess barriers to discharge/monitor patient progress toward functional and medical goals  Care Tool:  Bathing  Bathing activity did not occur: Refused Body parts bathed by patient: Right arm, Left arm, Chest, Abdomen, Front perineal area, Buttocks, Right upper leg, Left upper leg, Face   Body parts bathed by helper: Right lower leg, Left lower leg     Bathing assist Assist Level: Minimal Assistance - Patient > 75%     Upper Body Dressing/Undressing Upper body dressing   What is the patient wearing?: Pull over shirt    Upper body assist Assist Level: Supervision/Verbal cueing    Lower Body Dressing/Undressing Lower body dressing      What is the patient wearing?: Pants     Lower body assist Assist for lower body dressing: Maximal Assistance - Patient 25 - 49%     Toileting Toileting    Toileting assist Assist for toileting: Minimal Assistance - Patient > 75%     Transfers Chair/bed transfer  Transfers assist     Chair/bed transfer assist level: Minimal Assistance - Patient > 75%     Locomotion Ambulation   Ambulation assist      Assist level: Minimal Assistance - Patient > 75% Assistive device: No Device Max distance: 25ft   Walk 10 feet activity   Assist     Assist level: Minimal Assistance - Patient > 75% Assistive device: No Device   Walk 50 feet activity   Assist Walk 50 feet with 2 turns activity did not occur: Safety/medical concerns         Walk 150 feet activity   Assist Walk 150 feet activity did not occur: Safety/medical concerns         Walk 10 feet on uneven surface  activity   Assist Walk 10 feet on uneven surfaces activity did not occur: Safety/medical concerns         Wheelchair     Assist Will patient use wheelchair at  discharge?: No Type of Wheelchair: Manual    Wheelchair assist level: Moderate Assistance - Patient 50 - 74% Max wheelchair distance: 30 ft     Wheelchair 50 feet with 2 turns activity    Assist    Wheelchair 50 feet with 2 turns activity did not occur: Safety/medical concerns       Wheelchair 150 feet activity     Assist Wheelchair 150 feet activity did not occur: Safety/medical concerns        Medical Problem List and Plan: 1.Debilitysecondary to septic shock/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 Antithrombotics: -DVT/anticoagulation:SCDs -antiplatelet therapy: Aspirin 81 mg daily 3. Pain Management:Tylenol as needed 4. Mood:Zoloft 50 mg daily, trazodone 50 mg nightly, Ativan 0.5 mg as needed   Will consider scheduling anxiolytics -antipsychotic agents: N/A  -pt needs significant reassurance, anxiety is high 5. Neuropsych: This patientiscapable of making decisions onherown behalf. 6. Skin/Wound Care:Routine skin checks 7. Fluids/Electrolytes/Nutrition:encourage PO 8.Respiratory failure. 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  -plugging trials during day  -pt needs regular reassurance  -decannulate soon per CCM  -wean supplemental oxygen as tolerated    Requires frequent suctioning- anxiety component  Mucinex ordered 9.Non-STEMI. Continue aspirin. Follow-up cardiology services 10.Acute on chronic combined congestive heart failure. Continue Lasix 40 mg  daily.   Daily weights ordered again [facility] Weights   03/27/19 1543 03/28/19 0423  Weight: 75.2 kg 75.6 kg   Stable on 5/1 11.Atrial fibrillation with RVR. Amiodarone 200 mg daily. Cardiac rate controlled at present 12.Hypertension. Coreg 3.125 mg twice daily, hydralazine 10 mg twice daily, Imdur 30 mg daily, Cozaar 25 mg daily, Aldactone 12.5 mg  daily  Slightly labile on 5/3 13. Dysphagia. Dysphagia #2 honey thick liquids, advanced to D3 nectars  Continue to advance as tolerated 14. CKD stage III.   Creatinine 1.10 on 5/1  Continue to monitor 15. Acute on chronic anemia.   Hemoglobin 9.3 on 5/1  Continue to monitor 16. Diabetes mellitus. Hemoglobin A1c 10.1. Lantus insulin 15 units nightly.    Labile on 5/3,?  Improving 17. Hyperlipidemia. Lipitor   LOS: 3 days A FACE TO FACE EVALUATION WAS PERFORMED  Tymia Streb Karis Juba 03/30/2019, 9:39 AM

## 2019-03-30 NOTE — Progress Notes (Signed)
Occupational Therapy Session Note  Patient Details  Name: Gabrielle Wright MRN: [id number] Date of Birth: 1946/07/12  Today's Date: 03/30/2019 OT Individual Time: 7972-8206 OT Individual Time Calculation (min): 85 min    Short Term Goals: Week 1:  OT Short Term Goal 1 (Week 1): STG = LTG due to ELOS  Skilled Therapeutic Interventions/Progress Updates:    Pt received in recliner with no c/o pain, on 28% FiO2 via trach, sating at 100%. Stratus interpretor used throughout session. Pt declined participation in any ADLs this session. Pt completed stand pivot transfer to w/c with min A. Pt was transported to therapy gym. Pt used RW to complete ~5 ft of functional mobility to therapy mat where she sat with no LOB throughout session. Pt completed standing level functional stepping activity with BUE support on RW, CGA provided throughout, with min A required on last trials d/t fatigue. Pt then completed several trials of sit <> stand with no UE support, with CGA provided throughout. Extensive discussion with pt via interpretor re bathing at home, recommendation of TTB and family (S). Pt had several questions re HHOT follow up and expectations re trach removal. Pt returned to room and completed SPT to [facility] with CGA. Pt able to complete 3/3 toileting tasks with CGA for standing balance support.  Throughout session pt with SOB following exertion but SpO2 remained >98%. Pt frequently coughing up secretions and requesting RN suction at end of session. Pt left sitting up in recliner with all needs met and chair pad alarm set. Pt intermittently c/o back pain but requesting no intervention.   Therapy Documentation Precautions:  Precautions Precautions: Fall Precaution Comments: trach collar Restrictions Weight Bearing Restrictions: No   Therapy/Group: Individual Therapy  Curtis Sites 03/30/2019, 7:22 AM

## 2019-03-31 ENCOUNTER — Inpatient Hospital Stay (HOSPITAL_COMMUNITY): Payer: Medicaid Other | Admitting: Speech Pathology

## 2019-03-31 ENCOUNTER — Inpatient Hospital Stay (HOSPITAL_COMMUNITY): Payer: Medicaid Other | Admitting: Occupational Therapy

## 2019-03-31 ENCOUNTER — Inpatient Hospital Stay (HOSPITAL_COMMUNITY): Payer: Medicaid Other | Admitting: Physical Therapy

## 2019-03-31 ENCOUNTER — Inpatient Hospital Stay (HOSPITAL_COMMUNITY): Payer: Medicaid Other

## 2019-03-31 DIAGNOSIS — R1312 Dysphagia, oropharyngeal phase: Secondary | ICD-10-CM

## 2019-03-31 LAB — GLUCOSE, CAPILLARY
Glucose-Capillary: 166 mg/dL — ABNORMAL HIGH (ref 70–99)
Glucose-Capillary: 203 mg/dL — ABNORMAL HIGH (ref 70–99)
Glucose-Capillary: 163 mg/dL — ABNORMAL HIGH (ref 70–99)
Glucose-Capillary: 292 mg/dL — ABNORMAL HIGH (ref 70–99)

## 2019-03-31 MED ORDER — ALPRAZOLAM 0.25 MG PO TABS
0.2500 mg | ORAL_TABLET | Freq: Two times a day (BID) | ORAL | Status: DC
  Administered 2019-03-31 – 2019-04-05 (×11): 0.25 mg via ORAL
  Filled 2019-03-31 (×11): qty 1

## 2019-03-31 NOTE — Progress Notes (Signed)
RT note: RT capped trach per RN and MD request. RN and PT at bedside. Vital signs stable at this time. Patient is clearing her secretion with good productive cough.

## 2019-03-31 NOTE — Care Management (Signed)
Inpatient Rehabilitation Center Individual Statement of Services  Patient Name:  Gabrielle Wright  Date:  03/31/2019  Welcome to the Inpatient Rehabilitation Center.  Our goal is to provide you with an individualized program based on your diagnosis and situation, designed to meet your specific needs.  With this comprehensive rehabilitation program, you will be expected to participate in at least 3 hours of rehabilitation therapies Monday-Friday, with modified therapy programming on the weekends.  Your rehabilitation program will include the following services:  Physical Therapy (PT), Occupational Therapy (OT), Speech Therapy (ST), 24 hour per day rehabilitation nursing, Therapeutic Recreaction (TR), Case Management (Social Worker), Rehabilitation Medicine, Nutrition Services and Pharmacy Services  Weekly team conferences will be held on Tuesdays to discuss your progress.  Your Social Worker will talk with you frequently to get your input and to update you on team discussions.  Team conferences with you and your family in attendance may also be held.  Expected length of stay: 7-10 days   Overall anticipated outcome: supervision  Depending on your progress and recovery, your program may change. Your Social Worker will coordinate services and will keep you informed of any changes. Your Social Worker's name and contact numbers are listed  below.  The following services may also be recommended but are not provided by the Inpatient Rehabilitation Center:   Driving Evaluations  Home Health Rehabiltiation Services  Outpatient Rehabilitation Services   Arrangements will be made to provide these services after discharge if needed.  Arrangements include referral to agencies that provide these services.  Your insurance has been verified to be:  Medicaid Your primary doctor is:  Regino Schultze  Pertinent information will be shared with your doctor and your insurance company.  Social Worker:  [place], [place]  [phone] or ([phone]   Information discussed with and copy given to patient by: Amada Jupiter, 03/31/2019, 3:52 PM

## 2019-03-31 NOTE — Progress Notes (Signed)
Occupational Therapy Session Note  Patient Details  Name: Gabrielle Wright MRN: [id number] Date of Birth: Apr 19, 1946  Today's Date: 03/31/2019 OT Individual Time: 1050-1120 OT Individual Time Calculation (min): 30 min  and Today's Date: 03/31/2019 OT Missed Time: 15 Minutes Missed Time Reason: Patient fatigue;Other (comment)(anxiety)   Short Term Goals: Week 1:  OT Short Term Goal 1 (Week 1): STG = LTG due to ELOS  Skilled Therapeutic Interventions/Progress Updates:    Treatment session with focus on activity tolerance and education regarding self-care tasks and increased participation to increase independence.  Pt received upright in recliner reporting headache, however declining pain medication.  Pt refused any activity stating via interpreter that she had already walked this morning and this is "too much".  Therapist changed pt socks, per pt request.  Pt stating that her daughter or granddaughter would assist her with donning socks when she was unable.  Engaged in education via interpreter regarding increased independence and possible use of AE to increase independence with self-care tasks.  Also engaged in education regarding increased activity and endurance in preparation for decannulation.  Pt asking questions about trach and decannulation as she is anxious about removal.  O2 sats remained 97-98% throughout session on room air.  Pt remained upright in recliner with all needs in reach, per pt request.  Stratus video interpreter used throughout session.  Therapy Documentation Precautions:  Precautions Precautions: Fall Precaution Comments: trach capped on 5/4 (use nasal cannula if pt needs supplemental oxygen) Restrictions Weight Bearing Restrictions: No General: General OT Amount of Missed Time: 15 Minutes Vital Signs: Therapy Vitals Pulse Rate: 89 Resp: 16 Pain: Pain Assessment Pain Scale: Faces Faces Pain Scale: No hurt   Therapy/Group: Individual Therapy  Rosalio Loud 03/31/2019, 11:42 AM

## 2019-03-31 NOTE — Progress Notes (Signed)
Patient coughing a lot this morning when she woke up. Slept on and off during the night. Tracheal suction performed by RN. Encouraged patient to use yaunker to suction oral secretions. Toileted patient to beside commode and requested to sit in the lounge chair after. Patient continues to cough more than normal. Respiratory therapist called to check on the patient. RT came and suctioned her again with minimal secretions per R. RN asked patient if she has any pain and she denied. Prn ativan given. Offered sips of nectar thick and she coughs after few sips. Checked on patient couple of times and she was sitting quietly on the lounge chair. Chair alarm on.

## 2019-03-31 NOTE — Progress Notes (Signed)
[place] PHYSICAL MEDICINE & REHABILITATION PROGRESS NOTE   Subjective/Complaints: Still complaining of cough, thick secretions at times. Minimal secretions reported by RN this morning. Suction at bedside. I'm unclear how often she was capped this weekend. She indicates that she was last capped Friday  ROS: limited due to language/communication    Objective:   No results found. No results for input(s): WBC, HGB, HCT, PLT in the last 72 hours. No results for input(s): NA, K, CL, CO2, GLUCOSE, BUN, CREATININE, CALCIUM in the last 72 hours.  Intake/Output Summary (Last 24 hours) at 03/31/2019 0848 Last data filed at 03/31/2019 0700 Gross per 24 hour  Intake 360 ml  Output -  Net 360 ml     Physical Exam: Vital Signs Blood pressure (!) 110/53, pulse 86, temperature 98.2 F (36.8 C), temperature source Oral, resp. rate 14, height 5' 4.02" (1.626 m), weight 76.1 kg, SpO2 99 %. Constitutional: No distress . Vital signs reviewed. HEENT: EOMI, oral membranes moist Neck: supple, #4 trach with PMV, trach site clean without abnl discharge Cardiovascular: RRR without murmur. No JVD    Respiratory: normal effort. No wheezes or rhonchi, mostly upper airway sounds. Secretions all clear GI: BS +, non-tender, non-distended  Musc: No edema or tenderness in extremities. Neuro: Alert Motor: Grossly 4-/5 throughout Psych: remains anxious  Assessment/Plan:  1. Functional deficits secondary to debility after sepsis which require 3+ hours per day of interdisciplinary therapy in a comprehensive inpatient rehab setting.  Physiatrist is providing close team supervision and 24 hour management of active medical problems listed below.  Physiatrist and rehab team continue to assess barriers to discharge/monitor patient progress toward functional and medical goals  Care Tool:  Bathing  Bathing activity did not occur: Refused Body parts bathed by patient: Right arm, Left arm, Chest, Abdomen, Front  perineal area, Buttocks, Right upper leg, Left upper leg, Face   Body parts bathed by helper: Right lower leg, Left lower leg     Bathing assist Assist Level: Minimal Assistance - Patient > 75%     Upper Body Dressing/Undressing Upper body dressing   What is the patient wearing?: Pull over shirt    Upper body assist Assist Level: Supervision/Verbal cueing    Lower Body Dressing/Undressing Lower body dressing      What is the patient wearing?: Pants     Lower body assist Assist for lower body dressing: Maximal Assistance - Patient 25 - 49%     Toileting Toileting    Toileting assist Assist for toileting: Minimal Assistance - Patient > 75%     Transfers Chair/bed transfer  Transfers assist     Chair/bed transfer assist level: Minimal Assistance - Patient > 75%     Locomotion Ambulation   Ambulation assist      Assist level: Minimal Assistance - Patient > 75% Assistive device: No Device Max distance: 35ft   Walk 10 feet activity   Assist     Assist level: Minimal Assistance - Patient > 75% Assistive device: No Device   Walk 50 feet activity   Assist Walk 50 feet with 2 turns activity did not occur: Safety/medical concerns         Walk 150 feet activity   Assist Walk 150 feet activity did not occur: Safety/medical concerns         Walk 10 feet on uneven surface  activity   Assist Walk 10 feet on uneven surfaces activity did not occur: Safety/medical concerns  Wheelchair     Assist Will patient use wheelchair at discharge?: No Type of Wheelchair: Manual    Wheelchair assist level: Moderate Assistance - Patient 50 - 74% Max wheelchair distance: 30 ft     Wheelchair 50 feet with 2 turns activity    Assist    Wheelchair 50 feet with 2 turns activity did not occur: Safety/medical concerns       Wheelchair 150 feet activity     Assist Wheelchair 150 feet activity did not occur: Safety/medical  concerns        Medical Problem List and Plan: 1.Debilitysecondary to septic shock/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 Antithrombotics: -DVT/anticoagulation:SCDs -antiplatelet therapy: Aspirin 81 mg daily 3. Pain Management:Tylenol as needed 4. Mood:Zoloft 50 mg daily, trazodone 50 mg nightly, Ativan 0.5 mg as needed   Will consider scheduling anxiolytics -antipsychotic agents: N/A  -pt needs significant reassurance, anxiety is high 5. Neuropsych: This patientiscapable of making decisions onherown behalf. 6. Skin/Wound Care:Routine skin checks 7. Fluids/Electrolytes/Nutrition:encourage PO 8.Respiratory failure. 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  -I want plugging trials during day  -will add scheduled xanax to help with anxiety  -pt needs regular reassurance  -decannulate soon per CCM but need capping first  -wean supplemental oxygen as tolerated    -education provided today  Mucinex ordered this weekend 9.Non-STEMI. Continue aspirin. Follow-up cardiology services 10.Acute on chronic combined congestive heart failure. Continue Lasix 40 mg daily.   Daily weights ordered again [facility] Weights   03/27/19 1543 03/28/19 0423 03/31/19 0407  Weight: 75.2 kg 75.6 kg 76.1 kg   Stable on 5/4 11.Atrial fibrillation with RVR. Amiodarone 200 mg daily. Cardiac rate controlled at present 12.Hypertension. Coreg 3.125 mg twice daily, hydralazine 10 mg twice daily, Imdur 30 mg daily, Cozaar 25 mg daily, Aldactone 12.5 mg daily  Slightly labile on 5/4 13. Dysphagia. currently on D3 nectars  Continue to advance as tolerated 14. CKD stage III.   Creatinine 1.10 on 5/1  Continue to monitor 15. Acute on chronic anemia.   Hemoglobin 9.3 on 5/1  Continue to monitor 16. Diabetes mellitus. Hemoglobin A1c 10.1. Lantus insulin 15 units  nightly.    Improving control 17. Hyperlipidemia. Lipitor   LOS: 4 days A FACE TO FACE EVALUATION WAS PERFORMED  Ranelle Oyster 03/31/2019, 8:48 AM

## 2019-03-31 NOTE — Progress Notes (Signed)
Speech Language Pathology Daily Session Note  Patient Details  Name: Gabrielle Wright MRN: [id number] Date of Birth: 06-22-1946  Today's Date: 03/31/2019 SLP Individual Time: 0915-1000 SLP Individual Time Calculation (min): 45 min  Short Term Goals: Week 1: SLP Short Term Goal 1 (Week 1): Pt will tolerate mech soft diet and nectar thick liquids without overt s/s aspiration or decline in respiratory status SLP Short Term Goal 2 (Week 1): Pt will tolerate trials of thin liquid with limited bolus size and rate, without overt s/s aspiration or decline in respiratory status. SLP Short Term Goal 3 (Week 1): Pt will demonstrate ability to don/doff and clean PMSV with minA  Skilled Therapeutic Interventions:  Skilled treatment session focused on dysphagia and education with pt. SLP received pt upright in recliner with trach currently capped. Pt's sats were 98% with no supplemental O2. Pt has container of clear secretions that she has expelled on bedside table. Secretions appeared thin and clear. SLP facilitated session by providing skilled observation of pt consuming room temperature thin water via tsp (to regulate bolus size). Pt with appearance of swift swallow and no change in vitals throughout consumption. Pt with 1 cough throughout consumption. While coughing, pt's O2 remained stable at 97% but pt appeared to be gasping for air. With cues, pt able to engage in deep breathing and regain control of breathing. Pt's breathing was calm and WFL when SLP left pt upright in recliner. Education provided to pt on control of breathing, need to have trach capped prior to decannulating.      Pain Pain Assessment Pain Scale: Faces Faces Pain Scale: No hurt  Therapy/Group: Individual Therapy  Gabrielle Wright 03/31/2019, 9:57 AM

## 2019-03-31 NOTE — Progress Notes (Signed)
Physical Therapy Session Note  Patient Details  Name: Gabrielle Wright MRN: [id number] Date of Birth: 1946-10-18  Today's Date: 03/31/2019 PT Individual Time: 0803-0903 PT Individual Time Calculation (min): 60 min   Short Term Goals: Week 1:  PT Short Term Goal 1 (Week 1): STG = LTG due to short ELOS.  Skilled Therapeutic Interventions/Progress Updates:  Pt received in recliner & agreeable to tx. Pt on 28% FiO2 via trach collar for majority of session until respiratory therapist arrived & capped trach. MD arrived in room & MD & PT extensively educated pt on progression of capping then removing trach. Pt continues to report she coughs a lot, which limits how much sleep she gets, and is ready to have trach out. Provided pt with RW & pt completes sit<>stand transfers with supervision with cuing for hand placement. Pt ambulates 75 ft with RW & supervision with decreased gait speed and initial instructional cuing for proper use of RW. Respiratory therapist arrived towards end of session to cap pt's trach. Pt voiced fear of activity after trach capped but therapist provided encouragement & education regarding monitoring of oxygen levels, as well as need for pt to perform pursed lip breathing. Pt then agreeable to ambulating to door & back with RW & supervision and after task SpO2 = 88% but quickly increased to >90% & RN & respiratory therapist made aware (respiratory therapist recommends pt use nasal cannula if she needs supplemental oxygen). Pt's SpO2 >90% at end of session on room air. Pt left sitting in recliner with BLE elevated, call bell & all needs in reach, chair pad alarm set.    Communicated with pt via stratus interpreter throughout session.   Therapy Documentation Precautions:  Precautions Precautions: Fall Precaution Comments: trach capped on 5/4 (use nasal cannula if pt needs supplemental oxygen) Restrictions Weight Bearing Restrictions: No  Pain: Pt c/o cough & throat issues 2/2 trach &  MD made aware.    Therapy/Group: Individual Therapy  Sandi Mariscal 03/31/2019, 9:08 AM

## 2019-03-31 NOTE — Progress Notes (Signed)
Occupational Therapy Session Note  Patient Details  Name: Gabrielle Wright MRN: [id number] Date of Birth: [birth year]  Today's Date: 03/31/2019 OT Individual Time: 1400-1515 OT Individual Time Calculation (min): 75 min    Short Term Goals: Week 1:  OT Short Term Goal 1 (Week 1): STG = LTG due to ELOS  Skilled Therapeutic Interventions/Progress Updates:    Interpretor used throughout session. Pt received in recliner c/o cough and pain in her head "when she lays down". Pt on RA, trach capped, SpO2 98%. Pt used RW to transfer into w/c with CGA. Cueing required for UE placement. Pt transported to tub room. Demo provided re use of TTB and edu re indication for use. Pt returned demo with CGA. Pt began coughing up mucus and required cueing to slow breath/calm down. SpO2 and HR WNL. Pt transferred to [facility] and completed BUE reaching in standing to increase functional activity tolerance and BUE strength. Pt required frequent rest breaks throughout session. Pt consumed full honey thick water (no nectar thick available) with no s/s of aspiration. Pt completed item retrieval from floor with min A- CGA for 2 trials. Pt ended session with NuStep intervals to increase functional activity tolerance. Frequent cueing required to continue pedaling and to challenge cardiorespiratory endurance. Pt returned to room and was left sitting up with all needs met.   Therapy Documentation Precautions:  Precautions Precautions: Fall Precaution Comments: trach collar Restrictions Weight Bearing Restrictions: No   Therapy/Group: Individual Therapy  Curtis Sites 03/31/2019, 7:09 AM

## 2019-04-01 ENCOUNTER — Inpatient Hospital Stay (HOSPITAL_COMMUNITY): Payer: Medicaid Other | Admitting: Physical Therapy

## 2019-04-01 ENCOUNTER — Inpatient Hospital Stay (HOSPITAL_COMMUNITY): Payer: Medicaid Other

## 2019-04-01 ENCOUNTER — Inpatient Hospital Stay (HOSPITAL_COMMUNITY): Payer: Medicaid Other | Admitting: Speech Pathology

## 2019-04-01 LAB — GLUCOSE, CAPILLARY
Glucose-Capillary: 181 mg/dL — ABNORMAL HIGH (ref 70–99)
Glucose-Capillary: 244 mg/dL — ABNORMAL HIGH (ref 70–99)
Glucose-Capillary: 135 mg/dL — ABNORMAL HIGH (ref 70–99)
Glucose-Capillary: 194 mg/dL — ABNORMAL HIGH (ref 70–99)

## 2019-04-01 MED ORDER — INSULIN GLARGINE 100 UNIT/ML ~~LOC~~ SOLN
5.0000 [IU] | Freq: Every day | SUBCUTANEOUS | Status: DC
  Administered 2019-04-01 – 2019-04-05 (×5): 5 [IU] via SUBCUTANEOUS
  Filled 2019-04-01 (×6): qty 0.05

## 2019-04-01 NOTE — Progress Notes (Signed)
Physical Therapy Session Note  Patient Details  Name: Gabrielle Wright MRN: [id number] Date of Birth: [birth year]  Today's Date: 04/01/2019 PT Individual Time: 8325-4982 and 6415-8309 PT Individual Time Calculation (min): 42 min and 57 min  Short Term Goals: Week 1:  PT Short Term Goal 1 (Week 1): STG = LTG due to short ELOS.  Skilled Therapeutic Interventions/Progress Updates:  Treatment 1: Pt received in recliner reporting significant fatigue and requiring encouragement to participate - pt agreeable to exercises in the room. Pt with audible breathing (RN made aware) and therapist provides cuing for pursed lip breathing throughout session. SpO2 remains >96% on 2L/min supplemental oxygen via nasal cannula throughout session. Therapist continues to educate pt on trach. Pt transfers sit<>stand with supervision and while standing with BUE support on RW performs the following exercises with instructional cuing for BLE strengthening: standing marches & heel raises. Pt then begins to appear more fatigued but agreeable to seated exercises. Provided pt with 3# ankle weights & pt performs BLE long arc quads, hip flexion, and hip adduction pillow squeezes. At end of session pt left sitting in recliner with all needs in reach & chair alarm set.    Therapist provides education regarding course of admission and progression of plan of care in regards to trach and purpose of physical therapy. Therapist also encouraged pt to ambulate to bathroom when needing to toilet with assistance from nursing staff & RN made aware.    Treatment 2: Pt received in recliner, NT reports pt had just been in bathroom but pt ambulated out without assistance - therapist educated pt on need to use call bell & for nursing staff to assist her when she's finished toileting & instructed her on how to use call bell. Pt on room air upon PT arrival & SpO2 >90%; pt kept on room air throughout session with SpO2 remaining >90%. Pt requires cuing to  retreive items from EOB independently as pt initially asking PT to pick up items for her - continued to educate pt on need to increase independence as long as pt is safe to do so. Pt denied c/o pain & agreeable to going to gym. Pt transfers to w/c with RW & supervision assist. Pt ambulates 75 ft + 100 ft + 125 ft with RW & supervision with multiple standing rest breaks during each trial 2/2 fatigue. Pt denies SOB with each trial & SpO2 WNL. Pt negotiates 12 steps (6" + 3") with B rails and supervision. At end of session pt left in recliner with chair alarm donned & all needs at hand.  Pt continues to report she is irritated by her cough. RN made aware pt is on room air.   Communicated with pt via stratus interpreter during both sessions.   Therapy Documentation Precautions:  Precautions Precautions: Fall Precaution Comments: trach capped on 5/4 (use nasal cannula if pt needs supplemental oxygen) Restrictions Weight Bearing Restrictions: No      Therapy/Group: Individual Therapy  Sandi Mariscal 04/01/2019, 2:40 PM

## 2019-04-01 NOTE — Progress Notes (Signed)
Speech Language Pathology Daily Session Note  Patient Details  Name: Gabrielle Wright MRN: [id number] Date of Birth: 30-Jun-1946  Today's Date: 04/01/2019 SLP Individual Time: 1130-1155 SLP Individual Time Calculation (min): 25 min  Short Term Goals: Week 1: SLP Short Term Goal 1 (Week 1): Pt will tolerate mech soft diet and nectar thick liquids without overt s/s aspiration or decline in respiratory status SLP Short Term Goal 2 (Week 1): Pt will tolerate trials of thin liquid with limited bolus size and rate, without overt s/s aspiration or decline in respiratory status. SLP Short Term Goal 3 (Week 1): Pt will demonstrate ability to don/doff and clean PMSV with minA  Skilled Therapeutic Interventions: Skilled treatment session focused on speech and dysphagia goals. Upon arrival, patient was capped and her vitals remained [facility] throughout session. Patient was 100% intelligible (per video interpreter) but continues to demonstrate a hoarse vocal quality. Patient performed oral care via the suction toothbrush with supervision verbal cues for problem solving and consumed trials of room tempeature water via tsp. Patient's swallow appeared timely with only 1 cough noted.  Recommend continued trials with SLP. Patient left upright in recliner with alarm on and all needs within reach. Continue with current plan of care.      Pain Pain Assessment Pain Scale: 0-10 Pain Score: 0-No pain  Therapy/Group: Individual Therapy  Ericberto Padget 04/01/2019, 12:40 PM

## 2019-04-01 NOTE — Progress Notes (Signed)
Physical Therapy Session Note  Patient Details  Name: Gabrielle Wright MRN: [id number] Date of Birth: July 07, 1946  Today's Date: 04/01/2019 PT Individual Time: 3817-7116 PT Individual Time Calculation (min): 32 min   Short Term Goals: Week 1:  PT Short Term Goal 1 (Week 1): STG = LTG due to short ELOS.  Skilled Therapeutic Interventions/Progress Updates:    Stratus interpreter for [place] language used throughout session (interpreter: Tamala Julian [phone]). Pt received sitting in recliner reporting she sleeps here because it helps decrease her cough, but reports she is still getting very little sleep. Pt agreeable to therapy session. Pt's trach capped during session and she was on 2L of O2 via nasal cannula with SpO2 monitored via pulse oximeter throughout session. Performed sit<>stand from recliner/chair to RW with close supervision for safety throughout session. Pt ambulated ~155ft x2 using RW, seated break between, with close supervision for safety and ~3 standing rest breaks (<20seconds each) during each walk - focus on increased activity tolerance and endurance. SpO2 monitored throughout and noted to decrease to 96% with cuing for proper breathing technique and HR noted to be 110bpm during 2nd walk with it returning to 88bpm at end with seated rest break. Pt left sitting in recliner with needs in reach, lines intact, and chair alarm on.  Therapy Documentation Precautions:  Precautions Precautions: Fall Precaution Comments: trach capped on 5/4 (use nasal cannula if pt needs supplemental oxygen) Restrictions Weight Bearing Restrictions: No  Pain:  Reports no pain during session.   Therapy/Group: Individual Therapy  Ginny Forth, PT, DPT 04/01/2019, 8:42 AM

## 2019-04-01 NOTE — Progress Notes (Signed)
Occupational Therapy Session Note  Patient Details  Name: Gabrielle Wright MRN: [id number] Date of Birth: 07/15/1946  Today's Date: 04/01/2019 OT Individual Time: 5910-2890 OT Individual Time Calculation (min): 40 min    Short Term Goals: Week 1:  OT Short Term Goal 1 (Week 1): STG = LTG due to ELOS  Skilled Therapeutic Interventions/Progress Updates:    Session focused on ADLs at sink level. Stratus interpretor used throughout (privacy used when needed). Pt completed brief functional mobility to sink with RW (S) level. Pt stood at sink and doffed UB clothing with close (S) for standing balance. Pt washed UB and LB with (S) sit <> stand. Pt cued on rest breaks and activity pacing throughout. Pt able to don pants (S) with edu provided re having UE support when standing. Pt had questions re d/c planning and trach removal, all of which were answered as thoroughly as possible. No c/o pain throughout session. All VSS throughout. Pt returned to recliner and was left sitting up with all needs met.   Therapy Documentation Precautions:  Precautions Precautions: Fall Precaution Comments: trach capped on 5/4 (use nasal cannula if pt needs supplemental oxygen) Restrictions Weight Bearing Restrictions: No   Therapy/Group: Individual Therapy  Curtis Sites 04/01/2019, 7:09 AM

## 2019-04-01 NOTE — Progress Notes (Signed)
[place] PHYSICAL MEDICINE & REHABILITATION PROGRESS NOTE   Subjective/Complaints: Had a good night. Pointed to her trach being capped and said "24 hours!!" seems to be in good spirits  ROS: Patient denies fever, rash, sore throat, blurred vision, nausea, vomiting, diarrhea, cough, shortness of breath or chest pain, joint or back pain, headache, or mood change.   Objective:   No results found. No results for input(s): WBC, HGB, HCT, PLT in the last 72 hours. No results for input(s): NA, K, CL, CO2, GLUCOSE, BUN, CREATININE, CALCIUM in the last 72 hours.  Intake/Output Summary (Last 24 hours) at 04/01/2019 0837 Last data filed at 04/01/2019 0830 Gross per 24 hour  Intake 600 ml  Output -  Net 600 ml     Physical Exam: Vital Signs Blood pressure (!) 113/46, pulse 81, temperature 97.8 F (36.6 C), temperature source Oral, resp. rate 16, height 5' 4.02" (1.626 m), weight 77.1 kg, SpO2 98 %. Constitutional: No distress . Vital signs reviewed. HEENT: EOMI, oral membranes moist Neck: supple, trach capped. No secretions. Site normal Cardiovascular: RRR without murmur. No JVD    Respiratory: CTA Bilaterally without wheezes or rales. Normal effort    GI: BS +, non-tender, non-distended   Musc: No edema or tenderness in extremities. Neuro: Alert Motor: Grossly 4-/5 throughout Psych: pleasant and smiling today  Assessment/Plan:  1. Functional deficits secondary to debility after sepsis which require 3+ hours per day of interdisciplinary therapy in a comprehensive inpatient rehab setting.  Physiatrist is providing close team supervision and 24 hour management of active medical problems listed below.  Physiatrist and rehab team continue to assess barriers to discharge/monitor patient progress toward functional and medical goals  Care Tool:  Bathing  Bathing activity did not occur: Refused Body parts bathed by patient: Right arm, Left arm, Chest, Abdomen, Front perineal area,  Buttocks, Right upper leg, Left upper leg, Face   Body parts bathed by helper: Right lower leg, Left lower leg     Bathing assist Assist Level: Minimal Assistance - Patient > 75%     Upper Body Dressing/Undressing Upper body dressing   What is the patient wearing?: Pull over shirt    Upper body assist Assist Level: Supervision/Verbal cueing    Lower Body Dressing/Undressing Lower body dressing      What is the patient wearing?: Pants     Lower body assist Assist for lower body dressing: Maximal Assistance - Patient 25 - 49%     Toileting Toileting    Toileting assist Assist for toileting: Minimal Assistance - Patient > 75%     Transfers Chair/bed transfer  Transfers assist     Chair/bed transfer assist level: Minimal Assistance - Patient > 75%     Locomotion Ambulation   Ambulation assist      Assist level: Supervision/Verbal cueing Assistive device: Walker-rolling Max distance: 75 ft    Walk 10 feet activity   Assist     Assist level: Supervision/Verbal cueing Assistive device: Walker-rolling   Walk 50 feet activity   Assist Walk 50 feet with 2 turns activity did not occur: Safety/medical concerns  Assist level: Supervision/Verbal cueing Assistive device: Walker-rolling    Walk 150 feet activity   Assist Walk 150 feet activity did not occur: Safety/medical concerns         Walk 10 feet on uneven surface  activity   Assist Walk 10 feet on uneven surfaces activity did not occur: Safety/medical concerns         Wheelchair  Assist Will patient use wheelchair at discharge?: No Type of Wheelchair: Manual    Wheelchair assist level: Moderate Assistance - Patient 50 - 74% Max wheelchair distance: 30 ft     Wheelchair 50 feet with 2 turns activity    Assist    Wheelchair 50 feet with 2 turns activity did not occur: Safety/medical concerns       Wheelchair 150 feet activity     Assist Wheelchair 150 feet  activity did not occur: Safety/medical concerns        Medical Problem List and Plan: 1.Debilitysecondary to septic shock/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 Antithrombotics: -DVT/anticoagulation:SCDs -antiplatelet therapy: Aspirin 81 mg daily 3. Pain Management:Tylenol as needed 4. Mood:Zoloft 50 mg daily, trazodone 50 mg nightly, Ativan 0.5 mg as needed   Scheduled xanax added which seems to have helped greatly -antipsychotic agents: N/A  -pt needs significant reassurance, anxiety is high 5. Neuropsych: This patientiscapable of making decisions onherown behalf. 6. Skin/Wound Care:Routine skin checks 7. Fluids/Electrolytes/Nutrition:encourage PO 8.Respiratory failure. 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  -pt plugged for 24 hours. No issues.   -scheduled xanax to help with anxiety  -pt needs regular reassurance  -would like to decannulate today---will contact CCM  -wean supplemental oxygen as tolerated    -education provided today  Mucinex   9.Non-STEMI. Continue aspirin. Follow-up cardiology services 10.Acute on chronic combined congestive heart failure. Continue Lasix 40 mg daily.   Daily weights ordered, trending up?  -does not appear volume overloaded--continue to monitor Filed Weights   03/28/19 0423 03/31/19 0407 04/01/19 0521  Weight: 75.6 kg 76.1 kg 77.1 kg    11.Atrial fibrillation with RVR. Amiodarone 200 mg daily. Cardiac rate controlled at present 12.Hypertension. Coreg 3.125 mg twice daily, hydralazine 10 mg twice daily, Imdur 30 mg daily, Cozaar 25 mg daily, Aldactone 12.5 mg daily  Slightly labile on 5/5 13. Dysphagia. currently on D3 nectars  Continue to advance as tolerated 14. CKD stage III.   Creatinine 1.10 on 5/1  Continue to monitor 15. Acute on chronic anemia.    Hemoglobin 9.3 on 5/1  Continue to monitor 16. Diabetes mellitus. Hemoglobin A1c 10.1. Lantus insulin 15 units nightly.    change lantus to 5u am and 15u pm 17. Hyperlipidemia. Lipitor   LOS: 5 days A FACE TO FACE EVALUATION WAS PERFORMED  Ranelle Oyster 04/01/2019, 8:37 AM

## 2019-04-02 ENCOUNTER — Inpatient Hospital Stay (HOSPITAL_COMMUNITY): Payer: Medicaid Other | Admitting: Occupational Therapy

## 2019-04-02 ENCOUNTER — Inpatient Hospital Stay (HOSPITAL_COMMUNITY): Payer: Medicaid Other | Admitting: Speech Pathology

## 2019-04-02 ENCOUNTER — Inpatient Hospital Stay (HOSPITAL_COMMUNITY): Payer: Medicaid Other

## 2019-04-02 ENCOUNTER — Inpatient Hospital Stay (HOSPITAL_COMMUNITY): Payer: Medicaid Other | Admitting: Physical Therapy

## 2019-04-02 LAB — GLUCOSE, CAPILLARY
Glucose-Capillary: 194 mg/dL — ABNORMAL HIGH (ref 70–99)
Glucose-Capillary: 154 mg/dL — ABNORMAL HIGH (ref 70–99)
Glucose-Capillary: 166 mg/dL — ABNORMAL HIGH (ref 70–99)
Glucose-Capillary: 132 mg/dL — ABNORMAL HIGH (ref 70–99)

## 2019-04-02 NOTE — Progress Notes (Signed)
Speech Language Pathology Daily Session Note  Patient Details  Name: Gabrielle Wright MRN: [id number] Date of Birth: 02-03-1946  Today's Date: 04/02/2019 SLP Individual Time: 3154-0086 SLP Individual Time Calculation (min): 40 min  Short Term Goals: Week 1: SLP Short Term Goal 1 (Week 1): Pt will tolerate mech soft diet and nectar thick liquids without overt s/s aspiration or decline in respiratory status SLP Short Term Goal 2 (Week 1): Pt will tolerate trials of thin liquid with limited bolus size and rate, without overt s/s aspiration or decline in respiratory status. SLP Short Term Goal 3 (Week 1): Pt will demonstrate ability to don/doff and clean PMSV with minA  Skilled Therapeutic Interventions: Skilled treatment session focused on dysphagia goals. SLP facilitated session by providing supervision verbal cues for oral care via the suction toothbrush and provided trials of thin liquids. Patient consumed trials without overt s/s of aspiration and swallow initiation appeared timely. Recommend repeat MBS tomorrow to assess swallow function and possible upgrade. Throughout session, patient asking appropriate question in regards to procedures for decannulation and d/c planning. Patient left upright in recliner with alarm on and all needs within reach. Continue with current plan of care.      Pain No/Denies Pain   Therapy/Group: Individual Therapy  Micaela Stith 04/02/2019, 2:10 PM

## 2019-04-02 NOTE — Progress Notes (Signed)
Occupational Therapy Session Note  Patient Details  Name: Gabrielle Wright MRN: [id number] Date of Birth: 1946-03-24  Today's Date: 04/02/2019 OT Individual Time: 6090-1698 OT Individual Time Calculation (min): 57 min    Short Term Goals: Week 1:  OT Short Term Goal 1 (Week 1): STG = LTG due to ELOS  Skilled Therapeutic Interventions/Progress Updates:    1:1. Pt received in recliner verbalizing, "bathroom." Pt ambulates with RW to bathroom with S overall to complete toileting. Utilized tele interpreter to discuss advantages/disadvantages/safety concerns with rolator use. Pt ambulation with rollator and max fading to min VC for lock/unlocking brakes about 6x40-60 feet per round. Pt would benefit from continued reinforcement of RW management and brakes. Exited session with pt seated in w/c, call light in reach and all needs met.    Therapy Documentation Precautions:  Precautions Precautions: Fall Precaution Comments: trach capped on 5/4 (use nasal cannula if pt needs supplemental oxygen) Restrictions Weight Bearing Restrictions: No General:   Vital Signs: Therapy Vitals Pulse Rate: 81 Resp: 17 Patient Position (if appropriate): Sitting Oxygen Therapy SpO2: 96 % O2 Device: Room Air Pain:   ADL:   Vision   Perception    Praxis   Exercises:   Other Treatments:     Therapy/Group: Individual Therapy  Tonny Branch 04/02/2019, 2:31 PM

## 2019-04-02 NOTE — Progress Notes (Signed)
Occupational Therapy Session Note  Patient Details  Name: Gabrielle Wright MRN: [id number] Date of Birth: 1946-11-15  Today's Date: 04/02/2019 OT Individual Time: 1505-6979 OT Individual Time Calculation (min): 45 min    Short Term Goals: Week 1:  OT Short Term Goal 1 (Week 1): STG = LTG due to ELOS  Skilled Therapeutic Interventions/Progress Updates:    Upon entering the room, pt seated in recliner chair with NT exited the room and pt eating breakfast. OT utilized [employer] interpreter system for communication throughout session. Pt verbalized headache and RN provided medication this session. Pt initially on 2 L O2 when entering the room but left on room air throughout session with O2 remaining at 95% or better. Pt with no coughing with meal and taking small bites without cuing this session. Pt ambulating with RW into bathroom with close supervision for toilet transfer. Pt required steady assistance for balance when standing for clothing management and hygiene. Pt standing at sink for grooming tasks with supervision. Pt returning to recliner chair per pt request. Chair alarm activated and call bell within reach upon exiting the room.   Therapy Documentation Precautions:  Precautions Precautions: Fall Precaution Comments: trach capped on 5/4 (use nasal cannula if pt needs supplemental oxygen) Restrictions Weight Bearing Restrictions: No Vital Signs: Therapy Vitals Pulse Rate: 81 Resp: 17 Patient Position (if appropriate): Sitting Oxygen Therapy SpO2: 96 % O2 Device: Room Air Pain: Pain Assessment Pain Scale: 0-10 Pain Score: 3  Pain Type: Acute pain Pain Location: Head Pain Descriptors / Indicators: Headache Pain Frequency: Intermittent Pain Onset: On-going Pain Intervention(s): Refused ADL:   Vision   Perception    Praxis   Exercises:   Other Treatments:     Therapy/Group: Individual Therapy  Alen Bleacher 04/02/2019, 12:16 PM

## 2019-04-02 NOTE — Progress Notes (Addendum)
Physical Therapy Session Note  Patient Details  Name: Gabrielle Wright MRN: [id number] Date of Birth: 1946/06/28  Today's Date: 04/02/2019 PT Individual Time: 0917-1017 PT Individual Time Calculation (min): 60 min   Short Term Goals: Week 1:  PT Short Term Goal 1 (Week 1): STG = LTG due to short ELOS.  Skilled Therapeutic Interventions/Progress Updates:  Pt received asleep in recliner but easily awakened & agreeable to tx, denying c/o pain. Pt on room air throughout session & intermittently monitored during session with SpO2 remaining >90% throughout. Pt ambulates in room with RW with cuing to walk sideways PRN in small spaces, pt requires supervision for mobility in room to obtain shawl. Transported pt to gym via w/c dependent assist. Pt completes car transfer at sedan simulated height multiple times with as little as supervision with RW with max cuing to sit then place BLE in/out of car vs stepping in with pt demonstrating poor awareness of safe hand placement despite education & cuing. In dayroom, pt ambulates a total distance of 80 ft + 40 ft with supervision but multiple, frequent standing rest breaks 2/2 fatigue & pt denying SOB (SpO2 WNL after gait). Continued to educate pt on need to stay mobile at home and f/u HHPT. At end of session pt left in recliner with chair alarm set & on room air, all needs in reach.  Pt asking to drink regular water during session with therapist educating her on reasoning behind need to consume thickened liquids at this time.   Educated pt to perform pursed lip breathing throughout session.   Utilized [employer] interpreter to communicate with pt during session.   Therapy Documentation Precautions:  Precautions Precautions: Fall Precaution Comments: trach capped on 5/4 (use nasal cannula if pt needs supplemental oxygen) Restrictions Weight Bearing Restrictions: No    Therapy/Group: Individual Therapy  Sandi Mariscal 04/02/2019, 10:28 AM

## 2019-04-02 NOTE — Progress Notes (Signed)
[place] PHYSICAL MEDICINE & REHABILITATION PROGRESS NOTE   Subjective/Complaints: Seems to be in good spirits. sats have been in high 90's to 100. Occasional cough, but she's able to spit sputum up.   ROS: limited due to language/communication   Objective:   No results found. No results for input(s): WBC, HGB, HCT, PLT in the last 72 hours. No results for input(s): NA, K, CL, CO2, GLUCOSE, BUN, CREATININE, CALCIUM in the last 72 hours.  Intake/Output Summary (Last 24 hours) at 04/02/2019 0834 Last data filed at 04/02/2019 0802 Gross per 24 hour  Intake 630 ml  Output -  Net 630 ml     Physical Exam: Vital Signs Blood pressure 120/62, pulse 75, temperature 97.8 F (36.6 C), temperature source Oral, resp. rate 18, height 5' 4.02" (1.626 m), weight 74.1 kg, SpO2 100 %. Constitutional: No distress . Vital signs reviewed. HEENT: EOMI, oral membranes moist Neck: supple, buttoned trach #4 Cardiovascular: RRR without murmur. No JVD    Respiratory: CTA Bilaterally without wheezes or rales. Coughed clear sputum up into dish.    GI: BS +, non-tender, non-distended  Musc: No edema or tenderness in extremities. Neuro: Alert Motor: Grossly 4-/5 throughout Psych: in good spirits.   Assessment/Plan:  1. Functional deficits secondary to debility after sepsis which require 3+ hours per day of interdisciplinary therapy in a comprehensive inpatient rehab setting.  Physiatrist is providing close team supervision and 24 hour management of active medical problems listed below.  Physiatrist and rehab team continue to assess barriers to discharge/monitor patient progress toward functional and medical goals  Care Tool:  Bathing  Bathing activity did not occur: Refused Body parts bathed by patient: Right arm, Left arm, Chest, Abdomen, Front perineal area, Buttocks, Right upper leg, Left upper leg, Face, Right lower leg, Left lower leg   Body parts bathed by helper: Right lower leg, Left lower  leg     Bathing assist Assist Level: Supervision/Verbal cueing     Upper Body Dressing/Undressing Upper body dressing   What is the patient wearing?: Pull over shirt    Upper body assist Assist Level: Supervision/Verbal cueing    Lower Body Dressing/Undressing Lower body dressing      What is the patient wearing?: Pants     Lower body assist Assist for lower body dressing: Supervision/Verbal cueing     Toileting Toileting    Toileting assist Assist for toileting: Contact Guard/Touching assist     Transfers Chair/bed transfer  Transfers assist     Chair/bed transfer assist level: Supervision/Verbal cueing     Locomotion Ambulation   Ambulation assist      Assist level: Supervision/Verbal cueing Assistive device: Walker-rolling Max distance: 174ft   Walk 10 feet activity   Assist     Assist level: Supervision/Verbal cueing Assistive device: Walker-rolling   Walk 50 feet activity   Assist Walk 50 feet with 2 turns activity did not occur: Safety/medical concerns  Assist level: Supervision/Verbal cueing Assistive device: Walker-rolling    Walk 150 feet activity   Assist Walk 150 feet activity did not occur: Safety/medical concerns         Walk 10 feet on uneven surface  activity   Assist Walk 10 feet on uneven surfaces activity did not occur: Safety/medical concerns         Wheelchair     Assist Will patient use wheelchair at discharge?: No Type of Wheelchair: Manual    Wheelchair assist level: Moderate Assistance - Patient 50 - 74% Max wheelchair  distance: 30 ft     Wheelchair 50 feet with 2 turns activity    Assist    Wheelchair 50 feet with 2 turns activity did not occur: Safety/medical concerns       Wheelchair 150 feet activity     Assist Wheelchair 150 feet activity did not occur: Safety/medical concerns        Medical Problem List and Plan: 1.Debilitysecondary to septic shock/acute on  chronic combined systolic diastolic congestive heart failure/respiratory failure/non-STEMI  Continue CIR  -making continuous functional gains 2. Antithrombotics: -DVT/anticoagulation:SCDs -antiplatelet therapy: Aspirin 81 mg daily 3. Pain Management:Tylenol as needed 4. Mood:Zoloft 50 mg daily, trazodone 50 mg nightly, Ativan 0.5 mg as needed   Scheduled xanax added which seems to have helped greatly -antipsychotic agents: N/A    5. Neuropsych: This patientiscapable of making decisions onherown behalf. 6. Skin/Wound Care:Routine skin checks 7. Fluids/Electrolytes/Nutrition:encourage PO 8.Respiratory failure. 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  -pt plugged for 48 hours. No issues.   -scheduled xanax to help with anxiety  -spoke with CCM yesterday--decannulate after 72 hours if no issues  -wean supplemental oxygen as tolerated    -education provided today  Mucinex   9.Non-STEMI. Continue aspirin. Follow-up cardiology services 10.Acute on chronic combined congestive heart failure. Continue Lasix 40 mg daily.   Daily weights ordered, inconsistent readings  -does not appear volume overloaded--weight down today Filed Weights   03/31/19 0407 04/01/19 0521 04/02/19 0500  Weight: 76.1 kg 77.1 kg 74.1 kg    11.Atrial fibrillation with RVR. Amiodarone 200 mg daily. Cardiac rate controlled at present 12.Hypertension. Coreg 3.125 mg twice daily, hydralazine 10 mg twice daily, Imdur 30 mg daily, Cozaar 25 mg daily, Aldactone 12.5 mg daily  Controlled 5/6 13. Dysphagia. currently on D3 nectars  Continue to advance as tolerated 14. CKD stage III.   Creatinine 1.10 on 5/1  Continue to monitor 15. Acute on chronic anemia.   Hemoglobin 9.3 on 5/1  Continue to monitor 16. Diabetes mellitus. Hemoglobin A1c 10.1. Lantus insulin 15 units nightly.    changed lantus to 5u am and 15u pm yesterday---observe for pattern  today 17. Hyperlipidemia. Lipitor   LOS: 6 days A FACE TO FACE EVALUATION WAS PERFORMED  Ranelle Oyster 04/02/2019, 8:34 AM

## 2019-04-02 NOTE — Patient Care Conference (Signed)
Inpatient RehabilitationTeam Conference and Plan of Care Update Date: 04/01/2019   Time: 2:40 PM    Patient Name: Gabrielle Wright      Medical Record Number: [id number]  Date of Birth: [birth year] Sex: Female         Room/Bed: 4W04C/4W04C-01 Payor Info: Payor: MEDICAID [place] / Plan: MEDICAID [place] ACCESS / Product Type: *No Product type* /    Admitting Diagnosis: debility  Admit Date/Time:  03/27/2019  3:36 PM Admission Comments: No comment available   Primary Diagnosis:  <principal problem not specified> Principal Problem: <principal problem not specified>  Patient Active Problem List   Diagnosis Date Noted  . Diabetes mellitus type 2 in obese (HCC)   . Acute on chronic anemia   . Chronic kidney disease (CKD), stage III (moderate) (HCC)   . Dysphagia   . Essential hypertension   . Acute on chronic combined systolic (congestive) and diastolic (congestive) heart failure (HCC)   . Anxiety state   . Debility 03/27/2019  . Palliative care by specialist   . Goals of care, counseling/discussion   . Tracheostomy status (HCC)   . HCAP (healthcare-associated pneumonia)   . Acute pulmonary edema (HCC)   . Abdominal distension (gaseous)   . Acute on chronic respiratory failure with hypoxia (HCC)   . AKI (acute kidney injury) (HCC)   . Septic shock (HCC)   . CAD (coronary artery disease), native coronary artery 02/09/2019  . 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 Sepsis (HCC) 02/04/2019  . Hypotension 02/04/2019  . Acute respiratory failure with hypoxemia [facility])     Expected Discharge Date: Expected Discharge Date: 04/05/19  Team Members Present: Physician leading conference: Dr. Faith Rogue Social Worker Present: Amada Jupiter, LCSW Nurse Present: Kennyth Arnold, RN PT Present: Aleda Grana, PT OT Present: Other (comment)(Sandra Earlene Plater, OT) SLP Present: Feliberto Gottron, SLP PPS Coordinator present : Fae Pippin      Current Status/Progress Goal Weekly Team Focus  Medical   debilitated after respiratory failure. trac--anxiety/discomfort---now capped.  eating well  improve activity tolerance,   trach wean, removal; anxiety mgt, education   Bowel/Bladder   Continent of B/B. LBM 5/4  Maintain bladder and bowel function  Assist with toileting without injury   Swallow/Nutrition/ Hydration   Dys. 3 textures with nectar-thick liquids, Supervision  Mod I  trials of thin liquids, eventual MBS   ADL's   CGA transfers, CGA LB ADLs, (S) UB ADLs, requires frequent rest breaks however SpO2 has been >95% throughout all activity on RA  Supervision ADLs  functional activity tolerance, ADL retraining, BUE strengthening, ADL transfers   Mobility   supervision with transfer from bed to [facility]  supervision overall  Balance, gait, transfers   Communication             Safety/Cognition/ Behavioral Observations  Alert and oriented X4. Trach capped. Desats without oxygen at night, currently on 2L02 [place]. Calls appropriately for assistance  Reinforce and educate to call for assistance  Monitor need for oxygen, and titrate as needed   Pain   No C/O pain while sitting up but reports HA with lying down. She sat on the chair for the most part on this shift  Pain scale of <3 on pain scale of 1/10  Asssess for pain every 4 hrs and intervene as necessary   Skin   No skin issues noted  Maintain skin intergrity  Assess skin for breakdown and document every shift  Rehab Goals Patient on target to meet rehab goals: Yes *See Care Plan and progress notes for long and short-term goals.     Barriers to Discharge  Current Status/Progress Possible Resolutions Date Resolved   Physician    Medical stability        removal of trach should relieve much of her anxiety      Nursing  Effie Berkshire is capped, pt C/O SOB/anxiety          PT  Trach;Incontinence                 OT                  SLP                SW                 Discharge Planning/Teaching Needs:  Planning home with daughter and her family with daughter to provide 24/7 supervision.  need to schedule prior to d/c.   Team Discussion:  Debility; trach is capped and hoping to pull trach if stable with capped x 72 hours.  Cont b/b;  Good po intake.  Better with O2 sats.  Supervision to CGA overall with therapies and goals for supervision.  Revisions to Treatment Plan:  NA    Continued Need for Acute Rehabilitation Level of Care: The patient requires daily medical management by a physician with specialized training in physical medicine and rehabilitation for the following conditions: Daily direction of a multidisciplinary physical rehabilitation program to ensure safe treatment while eliciting the highest outcome that is of practical value to the patient.: Yes Daily medical management of patient stability for increased activity during participation in an intensive rehabilitation regime.: Yes Daily analysis of laboratory values and/or radiology reports with any subsequent need for medication adjustment of medical intervention for : Pulmonary problems;Wound care problems   I attest that I was present, lead the team conference, and concur with the assessment and plan of the team.   Azariya Freeman 04/02/2019, 11:10 AM   Team conference was held via web/ teleconference due to COVID - 19

## 2019-04-03 ENCOUNTER — Inpatient Hospital Stay (HOSPITAL_COMMUNITY): Payer: Medicaid Other

## 2019-04-03 ENCOUNTER — Inpatient Hospital Stay (HOSPITAL_COMMUNITY): Payer: Medicaid Other | Admitting: Speech Pathology

## 2019-04-03 ENCOUNTER — Encounter (HOSPITAL_COMMUNITY): Payer: Medicaid Other | Admitting: Speech Pathology

## 2019-04-03 LAB — GLUCOSE, CAPILLARY
Glucose-Capillary: 153 mg/dL — ABNORMAL HIGH (ref 70–99)
Glucose-Capillary: 211 mg/dL — ABNORMAL HIGH (ref 70–99)
Glucose-Capillary: 126 mg/dL — ABNORMAL HIGH (ref 70–99)
Glucose-Capillary: 45 mg/dL — ABNORMAL LOW (ref 70–99)
Glucose-Capillary: 148 mg/dL — ABNORMAL HIGH (ref 70–99)

## 2019-04-03 MED ORDER — PANTOPRAZOLE SODIUM 40 MG PO TBEC: 40.0000 mg | DELAYED_RELEASE_TABLET | Freq: Every day | ORAL | 0 refills | Status: AC

## 2019-04-03 MED ORDER — SPIRONOLACTONE 25 MG PO TABS: 12.5000 mg | ORAL_TABLET | Freq: Every day | ORAL | 2 refills | Status: AC

## 2019-04-03 MED ORDER — FUROSEMIDE 40 MG PO TABS: 40.0000 mg | ORAL_TABLET | Freq: Every day | ORAL | 0 refills | Status: AC

## 2019-04-03 MED ORDER — ISOSORBIDE MONONITRATE ER 30 MG PO TB24: 30.0000 mg | ORAL_TABLET | Freq: Every day | ORAL | 2 refills | Status: AC

## 2019-04-03 MED ORDER — CLOPIDOGREL BISULFATE 75 MG PO TABS: 75.0000 mg | ORAL_TABLET | Freq: Every day | ORAL | 0 refills | Status: AC

## 2019-04-03 MED ORDER — ALPRAZOLAM 0.25 MG PO TABS: 0.2500 mg | ORAL_TABLET | Freq: Two times a day (BID) | ORAL | 0 refills | Status: AC

## 2019-04-03 MED ORDER — CARVEDILOL 3.125 MG PO TABS: 3.1250 mg | ORAL_TABLET | Freq: Two times a day (BID) | ORAL | 2 refills | Status: AC

## 2019-04-03 MED ORDER — GUAIFENESIN ER 600 MG PO TB12: 600.0000 mg | ORAL_TABLET | Freq: Two times a day (BID) | ORAL | 0 refills | Status: AC

## 2019-04-03 MED ORDER — AMIODARONE HCL 200 MG PO TABS: 200.0000 mg | ORAL_TABLET | Freq: Every day | ORAL | 2 refills | Status: AC

## 2019-04-03 MED ORDER — SERTRALINE HCL 50 MG PO TABS: 50.0000 mg | ORAL_TABLET | Freq: Every day | ORAL | 2 refills | Status: AC

## 2019-04-03 MED ORDER — LOSARTAN POTASSIUM 25 MG PO TABS: 25.0000 mg | ORAL_TABLET | Freq: Every day | ORAL | 3 refills | Status: AC

## 2019-04-03 MED ORDER — TRAZODONE HCL 50 MG PO TABS: 50.0000 mg | ORAL_TABLET | Freq: Every day | ORAL | 2 refills | Status: AC

## 2019-04-03 MED ORDER — ATORVASTATIN CALCIUM 80 MG PO TABS: 80.0000 mg | ORAL_TABLET | Freq: Every day | ORAL | 0 refills | Status: AC

## 2019-04-03 MED ORDER — MONTELUKAST SODIUM 10 MG PO TABS: 10.0000 mg | ORAL_TABLET | Freq: Every day | ORAL | 0 refills | Status: AC

## 2019-04-03 MED ORDER — HYDRALAZINE HCL 10 MG PO TABS: 10.0000 mg | ORAL_TABLET | Freq: Two times a day (BID) | ORAL | 0 refills | Status: AC

## 2019-04-03 NOTE — Significant Event (Signed)
Hypoglycemic Event  CBG: 45  Treatment: 4 oz juice/soda  Symptoms: Sweaty, Shaky and Nervous/irritable  Follow-up CBG: Time:1653 CBG Result:126  Possible Reasons for Event: Unknown  Comments/MD notified:4oz Coke given, Dinner tray given, patient feeling better and dozing back to sleep.    Marlyne Beards, Suzi Roots

## 2019-04-03 NOTE — Progress Notes (Signed)
Speech Language Pathology Daily Session Note  Patient Details  Name: Gabrielle Wright MRN: [id number] Date of Birth: 24-Mar-1946  Today's Date: 04/03/2019 SLP Individual Time: 1315-1330 SLP Individual Time Calculation (min): 15 min  Short Term Goals: Week 1: SLP Short Term Goal 1 (Week 1): Pt will tolerate mech soft diet and nectar thick liquids without overt s/s aspiration or decline in respiratory status SLP Short Term Goal 1 - Progress (Week 1): Met SLP Short Term Goal 2 (Week 1): Pt will tolerate trials of thin liquid with limited bolus size and rate, without overt s/s aspiration or decline in respiratory status. SLP Short Term Goal 2 - Progress (Week 1): Met SLP Short Term Goal 3 (Week 1): Pt will demonstrate ability to don/doff and clean PMSV with minA SLP Short Term Goal 3 - Progress (Week 1): Discontinued (comment)(trach capped)  Skilled Therapeutic Interventions: Skilled treatment session focused on dysphagia goals. SLP facilitated session by providing skilled observation with lunch meal of Dys. 2 textures with thin liquids. Patient consumed meal without overt s/s of aspiration with overall Mod I. Patient with appropriate questions in regards to stoma and its impact on swallowing function. Patient declined any more of her meal and reported, "I don't feel like talking right now." Therefore, patient missed remaining 15 mins of session. Patient left upright in recliner with alarm on and all needs within reach. Continue with current plan of care.      Pain Pain Assessment Pain Score: 1   Therapy/Group: Individual Therapy  Gabrielle Wright 04/03/2019, 2:33 PM

## 2019-04-03 NOTE — Progress Notes (Signed)
Modified Barium Swallow Progress Note  Patient Details  Name: Gabrielle Wright MRN: [id number] Date of Birth: 1946/03/13  Today's Date: 04/03/2019  Modified Barium Swallow completed.  Full report located under Chart Review in the Imaging Section.  Brief recommendations include the following:  Clinical Impression  Pt continues to demonstrate improvement in pharyngeal swallow since prior MBS 03/28/2019. Oral phase continues to be [facility]. Pharyngeal stage mildly impaired, with delayed swallow reflex noted on thin liquids only. Penetration was seen before the swallow (x1 on initial straw sip), and during the swallow (intermittently on large consecutive cup sips), however, penetrate cleared spontaneously and completely, and was not aspirated.  Smaller sips were not penetrated. No post-swallow residue on any consistency. Barium tablet was given with water, and cleared the oropharynx without difficulty or delay. Recommend advancing diet to regular solids and thin liquids. Pt will require education regarding the importance of taking small sips of liquids. [name] is currently capped, with plan to decannulate pt today. SLP will follow to provide education regarding advanced diet and safe swallow precautions. Updated precautions posted in room, and team notified of changes.   Swallow Evaluation Recommendations  SLP Diet Recommendations: Regular solids;Thin liquid   Liquid Administration via: Cup;Straw   Medication Administration: Whole meds with liquid   Supervision: Patient able to self feed;Intermittent supervision to cue for compensatory strategies   Compensations: Minimize environmental distractions;Slow rate;Small sips/bites   Postural Changes: Remain semi-upright after after feeds/meals (Comment)   Oral Care Recommendations: Oral care BID      Celia B. Murvin Natal, [facility], CCC-SLP Speech Language Pathologist  Leigh Aurora 04/03/2019,10:00 AM

## 2019-04-03 NOTE — Progress Notes (Signed)
Patient unavailable x 2 attempts. Will assess patient & remove trach once patient returns to room.

## 2019-04-03 NOTE — Progress Notes (Signed)
Occupational Therapy Session Note  Patient Details  Name: Gabrielle Wright MRN: [id number] Date of Birth: 22-May-1946  Today's Date: 04/03/2019 OT Individual Time: 0702-0759 OT Individual Time Calculation (min): 57 min    Short Term Goals: Week 1:  OT Short Term Goal 1 (Week 1): STG = LTG due to ELOS  Skilled Therapeutic Interventions/Progress Updates:    1:1. Pt received in recliner with no c/o pain. Pt declines bathing and dressing this session. Pt completes standing grooming at sink with vitals staying >92% on RA with supervision. Pt able to walk from room to dayroom without seated rest breaks (3 standing breaks) and use of rollator. Pt requires min question cues for locking/unlocking brakes. Pt sits at high low table in dayroom and has discussion with OT about home set up, DME, and bathroom safety while pt consumes breakfast with supervision. Pt ambulates to chair in hallway to practice managing brakes on rolator and then bak to room with standing rest breaks PRN. Exited session with pt setaed in recliner, call light in reach and all needs met.   Therapy Documentation Precautions:  Precautions Precautions: Fall Precaution Comments: trach capped on 5/4 (use nasal cannula if pt needs supplemental oxygen) Restrictions Weight Bearing Restrictions: No General:   Vital Signs: Therapy Vitals Temp: (!) 97.5 F (36.4 C) Temp Source: Oral Pulse Rate: 96 Resp: 18 BP: 132/60 Patient Position (if appropriate): Sitting Oxygen Therapy SpO2: 99 % O2 Device: Nasal Cannula O2 Flow Rate (L/min): 1 L/min Pain:   ADL:   Vision   Perception    Praxis   Exercises:   Other Treatments:     Therapy/Group: Individual Therapy  Tonny Branch 04/03/2019, 7:33 AM

## 2019-04-03 NOTE — Progress Notes (Signed)
[facility] PHYSICAL MEDICINE & REHABILITATION PROGRESS NOTE   Subjective/Complaints: In good spirits. Ready for trach to come out. Reports occasional cough  ROS: Patient denies fever, rash, sore throat, blurred vision, nausea, vomiting, diarrhea, cough, shortness of breath or chest pain, joint or back pain, headache, or mood change.   Objective:   No results found. No results for input(s): WBC, HGB, HCT, PLT in the last 72 hours. No results for input(s): NA, K, CL, CO2, GLUCOSE, BUN, CREATININE, CALCIUM in the last 72 hours.  Intake/Output Summary (Last 24 hours) at 04/03/2019 0859 Last data filed at 04/03/2019 0825 Gross per 24 hour  Intake 338 ml  Output -  Net 338 ml     Physical Exam: Vital Signs Blood pressure 132/60, pulse 96, temperature (!) 97.5 F (36.4 C), temperature source Oral, resp. rate 18, height 5' 4.02" (1.626 m), weight 74.1 kg, SpO2 99 %. Constitutional: No distress . Vital signs reviewed. HEENT: EOMI, oral membranes moist Neck: supple Cardiovascular: RRR without murmur. No JVD    Respiratory: CTA Bilaterally without wheezes or rales. Normal effort    GI: BS +, non-tender, non-distended  Musc: No edema or tenderness in extremities. Neuro: Alert Motor: Grossly 4-/5 throughout Psych: in good spirits.   Assessment/Plan:  1. Functional deficits secondary to debility after sepsis which require 3+ hours per day of interdisciplinary therapy in a comprehensive inpatient rehab setting.  Physiatrist is providing close team supervision and 24 hour management of active medical problems listed below.  Physiatrist and rehab team continue to assess barriers to discharge/monitor patient progress toward functional and medical goals  Care Tool:  Bathing  Bathing activity did not occur: Refused Body parts bathed by patient: Right arm, Left arm, Chest, Abdomen, Front perineal area, Buttocks, Right upper leg, Left upper leg, Face, Right lower leg, Left lower leg   Body  parts bathed by helper: Right lower leg, Left lower leg     Bathing assist Assist Level: Supervision/Verbal cueing     Upper Body Dressing/Undressing Upper body dressing   What is the patient wearing?: Pull over shirt    Upper body assist Assist Level: Supervision/Verbal cueing    Lower Body Dressing/Undressing Lower body dressing      What is the patient wearing?: Pants     Lower body assist Assist for lower body dressing: Supervision/Verbal cueing     Toileting Toileting    Toileting assist Assist for toileting: Contact Guard/Touching assist     Transfers Chair/bed transfer  Transfers assist     Chair/bed transfer assist level: Supervision/Verbal cueing(RW)     Locomotion Ambulation   Ambulation assist      Assist level: Supervision/Verbal cueing Assistive device: Walker-rolling Max distance: 80 ft   Walk 10 feet activity   Assist     Assist level: Supervision/Verbal cueing Assistive device: Walker-rolling   Walk 50 feet activity   Assist Walk 50 feet with 2 turns activity did not occur: Safety/medical concerns  Assist level: Supervision/Verbal cueing Assistive device: Walker-rolling    Walk 150 feet activity   Assist Walk 150 feet activity did not occur: Safety/medical concerns         Walk 10 feet on uneven surface  activity   Assist Walk 10 feet on uneven surfaces activity did not occur: Safety/medical concerns         Wheelchair     Assist Will patient use wheelchair at discharge?: No Type of Wheelchair: Manual    Wheelchair assist level: Moderate Assistance - Patient  50 - 74% Max wheelchair distance: 30 ft     Wheelchair 50 feet with 2 turns activity    Assist    Wheelchair 50 feet with 2 turns activity did not occur: Safety/medical concerns       Wheelchair 150 feet activity     Assist Wheelchair 150 feet activity did not occur: Safety/medical concerns        Medical Problem List and  Plan: 1.Debilitysecondary to septic shock/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 Antithrombotics: -DVT/anticoagulation:SCDs -antiplatelet therapy: Aspirin 81 mg daily 3. Pain Management:Tylenol as needed 4. Mood:Zoloft 50 mg daily, trazodone 50 mg nightly, Ativan 0.5 mg as needed   Scheduled xanax added which seems to have helped greatly -antipsychotic agents: N/A    5. Neuropsych: This patientiscapable of making decisions onherown behalf. 6. Skin/Wound Care:Routine skin checks 7. Fluids/Electrolytes/Nutrition:encourage PO 8.Respiratory failure. 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  -pt plugged for 72 hours without issues  -removal of trach should help intermittent cough  -dc trach today.   -scheduled xanax to help with anxiety    9.Non-STEMI. Continue aspirin. Follow-up cardiology services 10.Acute on chronic combined congestive heart failure. Continue Lasix 40 mg daily.   =appears euvolemic   -weights stable Filed Weights   03/31/19 0407 04/01/19 0521 04/02/19 0500  Weight: 76.1 kg 77.1 kg 74.1 kg    11.Atrial fibrillation with RVR. Amiodarone 200 mg daily. Cardiac rate controlled at present 12.Hypertension. Coreg 3.125 mg twice daily, hydralazine 10 mg twice daily, Imdur 30 mg daily, Cozaar 25 mg daily, Aldactone 12.5 mg daily  Controlled 5/7 13. Dysphagia. currently on D3 nectars  Continue to advance as tolerated 14. CKD stage III.   Creatinine 1.10 on 5/1  Continue to monitor 15. Acute on chronic anemia.   Hemoglobin 9.3 on 5/1  Continue to monitor 16. Diabetes mellitus. Hemoglobin A1c 10.1. Lantus insulin 15 units nightly.    changed lantus to 5u am and 15u pm yesterday with improvement   -no changes today 17. Hyperlipidemia. Lipitor   LOS: 7 days A FACE TO FACE  EVALUATION WAS PERFORMED  Ranelle Oyster 04/03/2019, 8:59 AM

## 2019-04-03 NOTE — Discharge Summary (Signed)
Physician Discharge Summary  Patient ID: Gabrielle Wright MRN: [id number] DOB/AGE: 1945/12/13 73 y.o.  Admit date: 03/27/2019 Discharge date: 04/05/2019  Discharge Diagnoses:  Active Problems:   Debility   Diabetes mellitus type 2 in obese (HCC)   Acute on chronic anemia   Chronic kidney disease (CKD), stage III (moderate) (HCC)   Dysphagia   Essential hypertension   Acute on chronic combined systolic (congestive) and diastolic (congestive) heart failure (HCC)   Anxiety state DVT prophylaxis Mood Respiratory failure Non-STEMI  Discharged Condition: Stable  Significant Diagnostic Studies: Dg Chest 2 View  Result Date: 03/26/2019 CLINICAL DATA:  Tracheostomy.  Cough and congestion EXAM: CHEST - 2 VIEW COMPARISON:  03/21/2019 FINDINGS: Tracheostomy remains in good position. Improvement in left lower lobe airspace disease. Small left effusion. Right lung remains clear. Negative for heart failure or edema. IMPRESSION: Interval improvement left lower lobe airspace disease. Small left effusion. Electronically Signed   By: Marlan Palau M.D.   On: 03/26/2019 08:05   Dg Abd 1 View  Result Date: 03/16/2019 CLINICAL DATA:  NG tube placement EXAM: ABDOMEN - 1 VIEW COMPARISON:  03/16/2019 FINDINGS: NG tube tip is in the distal stomach. IMPRESSION: NG tube tip in the distal stomach. Electronically Signed   By: Charlett Nose M.D.   On: 03/16/2019 23:27   Dg Abd 1 View  Result Date: 03/16/2019 CLINICAL DATA:  NG tube placement EXAM: ABDOMEN - 1 VIEW COMPARISON:  Earlier same day FINDINGS: 1737 hours. Feeding tube no longer evident. NG tube tip is in the mid to distal stomach. Nonspecific bowel gas pattern within the visualized left abdomen. IMPRESSION: NG tube tip is in the mid to distal stomach. Electronically Signed   By: Kennith Center M.D.   On: 03/16/2019 17:48   Dg Abd 1 View  Result Date: 03/16/2019 CLINICAL DATA:  Feeding tube placement. EXAM: ABDOMEN - 1 VIEW COMPARISON:  None. FINDINGS: A  small bore feeding tube is identified with tip overlying the distal stomach. The visualized portions of the feeding tube are unremarkable. IMPRESSION: Small bore feeding tube with tip overlying the distal stomach. No abnormalities of the visualized feeding tube identified. Electronically Signed   By: Harmon Pier M.D.   On: 03/16/2019 14:22   Dg Abd 1 View  Result Date: 03/06/2019 CLINICAL DATA:  Ileus EXAM: ABDOMEN - 1 VIEW COMPARISON:  Yesterday FINDINGS: Feeding tube tip is at the pylorus. Gastric suction tube is at the stomach body. Haziness of the lower chest from pulmonary opacity and layering pleural effusions. The visualized bowel gas pattern is normal. Arterial calcification. IMPRESSION: Feeding tube tip at the pylorus and gastric suction tube tip at the gastric body-stable from yesterday Electronically Signed   By: Marnee Spring M.D.   On: 03/06/2019 05:21   Dg Chest Port 1 View  Result Date: 03/21/2019 CLINICAL DATA:  73 year old female with shortness of breath. Trach collar. EXAM: PORTABLE CHEST 1 VIEW COMPARISON:  03/17/2019 and earlier. FINDINGS: Portable AP upright view at 1025 hours. Stable tracheostomy. Right PICC line has been removed. Enteric tube has been removed. Continued low lung volumes. Regressed pulmonary interstitial opacity/vascular congestion since 03/17/2019, but continued hypo ventilation at the left lung base with patchy opacity. Stable cardiac size and mediastinal contours. No pneumothorax. Paucity of bowel gas in the upper abdomen. IMPRESSION: 1. Regressed pulmonary vascular congestion but continued low lung volumes with left lung base atelectasis or consolidation. 2. Enteric tube removed. Right PICC line removed. Electronically Signed   By: Odessa Fleming  M.D.   On: 03/21/2019 10:39   Dg Chest Port 1 View  Result Date: 03/17/2019 CLINICAL DATA:  Shortness of breath and congestion EXAM: PORTABLE CHEST 1 VIEW COMPARISON:  March 15, 2019 FINDINGS: Tracheostomy catheter tip is 1.5  cm above the carina. Nasogastric tube tip and side port are below the diaphragm. Central catheter tip is in the right atrium slightly distal to the superior vena cava-right atrium junction. No pneumothorax. There is cardiomegaly with pulmonary vascularity grossly normal. There is a left pleural effusion with left lower lobe consolidation. There is a smaller right pleural effusion. There is a slight degree of interstitial edema. IMPRESSION: Tube and catheter positions as described without pneumothorax. Stable cardiac prominence. Consolidation with left pleural effusion on the left. Suspect pneumonia, although alveolar edema could present in this manner. There is a smaller right pleural effusion. There is mild interstitial edema. A degree of superimposed congestive heart failure must be of concern. Electronically Signed   By: Bretta Bang III M.D.   On: 03/17/2019 10:02   Dg Chest Port 1 View  Result Date: 03/15/2019 CLINICAL DATA:  73 year old female with chronic respiratory failure EXAM: PORTABLE CHEST 1 VIEW COMPARISON:  Prior chest x-ray 03/14/2019 FINDINGS: The tracheostomy tube remains in good position. The tip is midline and at the level of the clavicles. A feeding tube is present, the tip is not identified but lies off the field of view below the diaphragm and likely within the stomach or small bowel. A right upper extremity PICC is present. The catheter tip overlies the upper right atrium. Stable cardiomegaly. Atherosclerotic calcifications again noted in the transverse aorta. Pulmonary vascular congestion with diffuse mild interstitial prominence and peripheral Kerley B-lines consistent with mild interstitial edema. These findings have progressed compared to yesterday. Patchy left basilar airspace opacity partially obscures the hemidiaphragm. No pneumothorax. No acute osseous abnormality. IMPRESSION: 1. Slightly increased interstitial pulmonary edema. 2. Persistent left retrocardiac airspace opacity  which may reflect atelectasis and/or infiltrate. 3. Support apparatus in stable and satisfactory position. Electronically Signed   By: Malachy Moan M.D.   On: 03/15/2019 09:16   Dg Chest Port 1 View  Result Date: 03/14/2019 CLINICAL DATA:  Respiratory failure. EXAM: PORTABLE CHEST 1 VIEW COMPARISON:  One-view chest x-ray 03/11/2019 FINDINGS: Heart size is exaggerated by low lung volumes. Atherosclerotic calcifications are present at the aortic arch. Tracheostomy tube is stable. Right-sided PICC line is in satisfactory position. A feeding tube courses off the inferior border of the film. Left greater than right basilar airspace disease is stable. There is slight increase in mild pulmonary vascular congestion. IMPRESSION: 1. Left greater than right basilar airspace disease is similar the prior study. While this may represent atelectasis, infection is not excluded. 2. Slight increase in pulmonary vascular congestion. 3. Support apparatus is stable. Electronically Signed   By: Marin Roberts M.D.   On: 03/14/2019 08:15   Dg Chest Port 1 View  Result Date: 03/11/2019 CLINICAL DATA:  Shortness of breath EXAM: PORTABLE CHEST 1 VIEW COMPARISON:  March 06, 2019 FINDINGS: Tracheostomy catheter tip is 4.4 cm above the carina. Central catheter tip is in the right atrium slightly beyond the superior vena cava-right atrium junction. Feeding tube tip is below the diaphragm with tip not seen. No pneumothorax. There is consolidation in the left base with small left pleural effusion. There is mild right base atelectasis. Heart is mildly enlarged with pulmonary vascularity normal. There is aortic atherosclerosis. No adenopathy. No bone lesions. IMPRESSION: Tube and catheter positions  as described without pneumothorax. Consolidation in a portion of the left lower lobe, likely pneumonia, with small left pleural effusion. Mild right base atelectasis. Stable cardiac silhouette.  Aortic Atherosclerosis (ICD10-I70.0).  Electronically Signed   By: Bretta Bang III M.D.   On: 03/11/2019 10:19   Dg Chest Port 1 View  Result Date: 03/06/2019 CLINICAL DATA:  Pulmonary edema EXAM: PORTABLE CHEST 1 VIEW COMPARISON:  Three days ago FINDINGS: Haziness of the bilateral chest with layering pleural effusions. There is also lung opacity which could be infection or edema. Tracheostomy tube in place. The enteric tubes reache the stomach. Right PICC with tip at the upper cavoatrial junction. No pneumothorax. IMPRESSION: 1. Unremarkable hardware positioning. 2. Unchanged layering pleural effusions and pulmonary opacification. Electronically Signed   By: Marnee Spring M.D.   On: 03/06/2019 05:37   Dg Abd Portable 1v  Result Date: 03/18/2019 CLINICAL DATA:  Feeding tube placed EXAM: PORTABLE ABDOMEN - 1 VIEW COMPARISON:  None. FINDINGS: Feeding tube with the tip projecting over the antrum of the stomach. There is no bowel dilatation to suggest obstruction. There is no evidence of pneumoperitoneum, portal venous gas or pneumatosis. There are no pathologic calcifications along the expected course of the ureters. The osseous structures are unremarkable. IMPRESSION: Feeding tube with the tip projecting over the antrum of the stomach. Electronically Signed   By: Elige Ko   On: 03/18/2019 12:48   Dg Abd Portable 1v  Result Date: 03/05/2019 CLINICAL DATA:  NG tube placement EXAM: PORTABLE ABDOMEN - 1 VIEW COMPARISON:  02/24/2019 FINDINGS: NG tube is in the mid stomach. Feeding tube has been placed with the tip in the distal stomach or proximal duodenum. Nonobstructive bowel gas pattern. Diffuse airspace disease throughout the lungs with layering effusions. IMPRESSION: NG tube remains in the mid stomach. Feeding tube tip is in the peri pyloric region. Electronically Signed   By: Charlett Nose M.D.   On: 03/05/2019 11:15   Dg Swallowing Func-speech Pathology  Result Date: 04/03/2019 Objective Swallowing Evaluation: Type of Study:  MBS-Modified Barium Swallow Study  Patient Details Name: Lavella Myren MRN: [id number] Date of Birth: 1946-10-28 Today's Date: 04/03/2019 Time: SLP Start Time (ACUTE ONLY): 1135 -SLP Stop Time (ACUTE ONLY): 1150 SLP Time Calculation (min) (ACUTE ONLY): 15 min Past Medical History: Past Medical History: Diagnosis Date . Coronary artery disease  . Diabetes mellitus without complication (HCC)  . Hypertension  Past Surgical History: Past Surgical History: Procedure Laterality Date . CORONARY ANGIOPLASTY WITH STENT PLACEMENT   . CORONARY/GRAFT ACUTE MI REVASCULARIZATION N/A 02/09/2019  Procedure: Coronary/Graft Acute MI Revascularization;  Surgeon: Lyn Records, MD;  [facility]: [facility] INVASIVE CV LAB;  Service: Cardiovascular;  Laterality: N/A; . LEFT HEART CATH AND CORONARY ANGIOGRAPHY N/A 02/09/2019  Procedure: LEFT HEART CATH AND CORONARY ANGIOGRAPHY;  Surgeon: Lyn Records, MD;  Location: [facility] INVASIVE CV LAB;  Service: Cardiovascular;  Laterality: N/A; HPI: 73 y.o. female admitted on 02/03/19 for fever and cough (to [facility]) and was transferred to [facility] on 02/04/19 for hypotension and respiratory distress (acute respiratory failure/pulmonary edema) requiring intubation 3/10.  She was visiting family from [place] and COVID19 tests were negative.  She was found to have group A strep bacteremia (septic shock), and rhinovirus. Course complicated by NSTEMI and new LV dysfunction s/p cardiac cath on 02/09/19.  Other dx include ischemic cardiomyopathy, hyperkalemia, DM2- uncontrolled, and abdominal distention.  Pt with significant PMH of DM, HTN, anc CAD. Trach placed on 3/31, currently capped with  plan for removal today.  Subjective: pt alert, cooperative Assessment / Plan / Recommendation CHL IP CLINICAL IMPRESSIONS 04/03/2019 Clinical Impression Pt continues to demonstrate improvement in pharyngeal swallow since prior MBS 03/28/2019. Oral [facility]. Pharyngeal stage mildly impaired, with delayed swallow  reflex noted on thin liquids only. Penetration was seen before the swallow (x1 on initial straw sip), and during the swallow (intermittently on large consecutive cup sips), however, penetrate cleared spontaneously and completely, and was not aspirated.  Smaller sips were not penetrated. No post-swallow residue on any consistency. Barium tablet was given with water, and cleared the oropharynx without difficulty or delay. Recommend advancing diet to regular solids and thin liquids. Pt will require education regarding the importance of taking small sips of liquids. [name] is currently capped, with plan to decannulate pt today. SLP will follow to provide education regarding advanced diet and safe swallow precautions. Updated precautions posted in room, and team notified of changes. SLP Visit Diagnosis Dysphagia, pharyngeal phase (R13.13) Impact on safety and function Mild aspiration risk   CHL IP TREATMENT RECOMMENDATION 04/03/2019 Treatment Recommendations Therapy as outlined in treatment plan below   Prognosis 04/03/2019 Prognosis for Safe Diet Advancement Good Barriers to Reach Goals Language barrier   CHL IP DIET RECOMMENDATION 04/03/2019 SLP Diet Recommendations Regular solids;Thin liquid Liquid Administration via Cup;Straw Medication Administration Whole meds with liquid Compensations Minimize environmental distractions;Slow rate;Small sips/bites Postural Changes Remain semi-upright after after feeds/meals (Comment)   CHL IP OTHER RECOMMENDATIONS 04/03/2019 Recommended Consults -- Oral Care Recommendations Oral care BID Other Recommendations --   CHL IP FOLLOW UP RECOMMENDATIONS 04/03/2019 Follow up Recommendations None   CHL IP FREQUENCY AND DURATION 03/19/2019 Speech Therapy Frequency (ACUTE ONLY) min 2x/week Treatment Duration 2 weeks      CHL IP ORAL PHASE 04/03/2019 Oral Phase WFL  CHL IP PHARYNGEAL PHASE 04/03/2019 Pharyngeal Phase Impaired Pharyngeal- Pudding Teaspoon -- Pharyngeal -- Pharyngeal- Pudding Cup -- Pharyngeal  -- Pharyngeal- Honey Teaspoon NT Pharyngeal -- Pharyngeal- Honey Cup NT Pharyngeal -- Pharyngeal- Nectar Teaspoon NT Pharyngeal -- Pharyngeal- Nectar Cup WFL Pharyngeal -- Pharyngeal- Nectar Straw -- Pharyngeal -- Pharyngeal- Thin Teaspoon NT Pharyngeal -- Pharyngeal- Thin Cup Delayed swallow initiation-vallecula;Reduced airway/laryngeal closure;Penetration/Aspiration during swallow Pharyngeal Material enters airway, remains ABOVE vocal cords then ejected out;Material does not enter airway Pharyngeal- Thin Straw -- Pharyngeal -- Pharyngeal- Puree WFL Pharyngeal -- Pharyngeal- Mechanical Soft WFL Pharyngeal -- Pharyngeal- Regular -- Pharyngeal -- Pharyngeal- Multi-consistency -- Pharyngeal -- Pharyngeal- Pill WFL Pharyngeal -- Pharyngeal Comment --  CHL IP CERVICAL ESOPHAGEAL PHASE 04/03/2019 Cervical Esophageal Phase [facility] Celia B. Murvin NatalBueche, [facility] IvMSP, CCC-SLP Speech Language Pathologist Leigh AuroraBueche, Celia Brown 04/03/2019, 10:01 AM              Dg Swallowing Func-speech Pathology  Result Date: 03/28/2019 Objective Swallowing Evaluation: Type of Study: MBS-Modified Barium Swallow Study  Patient Details Name: Allayne GitelmanSaifun Shapley MRN: [id number] Date of Birth: 06/29/1946 Today's Date: 03/28/2019 Time: SLP Start Time (ACUTE ONLY): 1135 -SLP Stop Time (ACUTE ONLY): 1150 SLP Time Calculation (min) (ACUTE ONLY): 15 min Past Medical History: Past Medical History: Diagnosis Date . Coronary artery disease  . Diabetes mellitus without complication (HCC)  . Hypertension  Past Surgical History: Past Surgical History: Procedure Laterality Date . CORONARY ANGIOPLASTY WITH STENT PLACEMENT   . CORONARY/GRAFT ACUTE MI REVASCULARIZATION N/A 02/09/2019  Procedure: Coronary/Graft Acute MI Revascularization;  Surgeon: Lyn RecordsSmith, Henry W, MD;  [facility];  Service: Cardiovascular;  Laterality: N/A; . LEFT HEART CATH AND CORONARY ANGIOGRAPHY  N/A 02/09/2019  Procedure: LEFT HEART CATH AND CORONARY ANGIOGRAPHY;  Surgeon: Lyn Records, MD;  Location: [facility]  INVASIVE CV LAB;  Service: Cardiovascular;  Laterality: N/A; HPI: 73 y.o. female admitted on 02/03/19 for fever and cough (to [facility]) and was transferred to [facility] on 02/04/19 for hypotension and respiratory distress (acute respiratory failure/pulmonary edema) requiring intubation 3/10.  She was visiting family from [place] and COVID19 tests were negative.  She was found to have group A strep bacteremia (septic shock), and rhinovirus. Course complicated by NSTEMI and new LV dysfunction s/p cardiac cath on 02/09/19.  Other dx include ischemic cardiomyopathy, hyperkalemia, DM2- uncontrolled, and abdominal distention.  Pt with significant PMH of DM, HTN, anc CAD. Trach placed on 3/31.  Subjective: pt alert, anxious Assessment / Plan / Recommendation CHL IP CLINICAL IMPRESSIONS 03/28/2019 Clinical Impression Pt demonstrates improvement since prior MBS completed 03/19/2019. Improvements noted in oral and pharyngeal function and safety Oral phase is now within normal limits, with adequate oral prep and no residue across all consistencies. Pharyngeally, pt exhibits delayed swallow reflex, with trigger noted at the pyriform sinus on thin and nectar thick liquids, and at the vallecula on puree, solid, and barium tablet. Pt had difficulty following command to take one small sip of thin liquids, and so exhibited penetration to the vocal cords with no reflexive cough. Pt was able to demonstrate strong volitional cough on command, which cleared penetrate from laryngeal vestibule. Pt was challenged to take multiple boluses of nectar thick liquid via cup and straw, and exhibited intermittent flash penetration during the swallow, which cleared spontaneously and was not aspirated. There was no post-swallow residue on any consistency. Puree, solid, and barium tablet (given with puree) were tolerated well. Recommend advancing diet to mechanical soft with nectar thick liquids, meds whole with puree, intermittent  supervision/assist, with PMSV in place during meals. These results were relayed to MD and RN, and safe swallow precautions were posted in pt room along with PMSV use information (wear all waking hours and all po intake).  SLP Visit Diagnosis Dysphagia, pharyngeal phase (R13.13) Attention and concentration deficit following -- Frontal lobe and executive function deficit following -- Impact on safety and function Mild aspiration risk;Moderate aspiration risk   CHL IP TREATMENT RECOMMENDATION 03/28/2019 Treatment Recommendations Therapy as outlined in treatment plan below   Prognosis 03/28/2019 Prognosis for Safe Diet Advancement Good Barriers to Reach Goals (No Data) Barriers/Prognosis Comment -- CHL IP DIET RECOMMENDATION 03/28/2019 SLP Diet Recommendations Dysphagia 3 (Mech soft) solids;Nectar thick liquid Liquid Administration via Cup;Straw Medication Administration Whole meds with puree Compensations Slow rate;Small sips/bites;Minimize environmental distractions Postural Changes --   CHL IP OTHER RECOMMENDATIONS 03/28/2019 Recommended Consults -- Oral Care Recommendations Oral care BID Other Recommendations --   CHL IP FOLLOW UP RECOMMENDATIONS 03/28/2019 Follow up Recommendations None   CHL IP FREQUENCY AND DURATION 03/19/2019 Speech Therapy Frequency (ACUTE ONLY) min 2x/week Treatment Duration 2 weeks      CHL IP ORAL PHASE 03/28/2019 Oral Phase WFL Oral - Pudding Teaspoon -- Oral - Pudding Cup -- Oral - Honey Teaspoon -- Oral - Honey Cup -- Oral - Nectar Teaspoon -- Oral - Nectar Cup -- Oral - Nectar Straw -- Oral - Thin Teaspoon -- Oral - Thin Cup -- Oral - Thin Straw -- Oral - Puree -- Oral - Mech Soft -- Oral - Regular -- Oral - Multi-Consistency -- Oral - Pill -- Oral Phase - Comment --  CHL IP PHARYNGEAL PHASE 03/28/2019 Pharyngeal Phase  Impaired Pharyngeal- Pudding Teaspoon -- Pharyngeal -- Pharyngeal- Pudding Cup -- Pharyngeal -- Pharyngeal- Honey Teaspoon NT Pharyngeal -- Pharyngeal- Honey Cup NT Pharyngeal --  Pharyngeal- Nectar Teaspoon Delayed swallow initiation-pyriform sinuses Pharyngeal -- Pharyngeal- Nectar Cup Delayed swallow initiation-pyriform sinuses;Penetration/Aspiration during swallow Pharyngeal Material does not enter airway;Material enters airway, remains ABOVE vocal cords then ejected out Pharyngeal- Nectar Straw -- Pharyngeal -- Pharyngeal- Thin Teaspoon Delayed swallow initiation-pyriform sinuses Pharyngeal -- Pharyngeal- Thin Cup Delayed swallow initiation-pyriform sinuses;Penetration/Aspiration during swallow Pharyngeal Material enters airway, CONTACTS cords and not ejected out Pharyngeal- Thin Straw -- Pharyngeal -- Pharyngeal- Puree Delayed swallow initiation-vallecula Pharyngeal -- Pharyngeal- Mechanical Soft Delayed swallow initiation-vallecula Pharyngeal -- Pharyngeal- Regular -- Pharyngeal -- Pharyngeal- Multi-consistency -- Pharyngeal -- Pharyngeal- Pill Delayed swallow initiation-vallecula Pharyngeal -- Pharyngeal Comment --  CHL IP CERVICAL ESOPHAGEAL PHASE 03/19/2019 Cervical Esophageal Phase WFL Pudding Teaspoon -- Pudding Cup -- Honey Teaspoon -- Honey Cup -- Nectar Teaspoon -- Nectar Cup -- Nectar Straw -- Thin Teaspoon -- Thin Cup -- Thin Straw -- Puree -- Mechanical Soft -- Regular -- Multi-consistency -- Pill -- Cervical Esophageal Comment -- Leigh Aurora 03/28/2019, 12:24 PM              Dg Swallowing Func-speech Pathology  Result Date: 03/19/2019 Objective Swallowing Evaluation: Type of Study: MBS-Modified Barium Swallow Study  Patient Details Name: Kenlynn Delgardo MRN: [id number] Date of Birth: 08-19-1946 Today's Date: 03/19/2019 Time: SLP Start Time (ACUTE ONLY): 1030 -SLP Stop Time (ACUTE ONLY): 1050 SLP Time Calculation (min) (ACUTE ONLY): 20 min Past Medical History: Past Medical History: Diagnosis Date . Coronary artery disease  . Diabetes mellitus without complication (HCC)  . Hypertension  Past Surgical History: Past Surgical History: Procedure Laterality Date . CORONARY  ANGIOPLASTY WITH STENT PLACEMENT   . CORONARY/GRAFT ACUTE MI REVASCULARIZATION N/A 02/09/2019  Procedure: Coronary/Graft Acute MI Revascularization;  Surgeon: Lyn Records, MD;  Location: [facility] INVASIVE CV LAB;  Service: Cardiovascular;  Laterality: N/A; . LEFT HEART CATH AND CORONARY ANGIOGRAPHY N/A 02/09/2019  Procedure: LEFT HEART CATH AND CORONARY ANGIOGRAPHY;  Surgeon: Lyn Records, MD;  Location: [facility] INVASIVE CV LAB;  Service: Cardiovascular;  Laterality: N/A; HPI: 73 y.o. female admitted on 02/03/19 for fever and cough (to [facility]) and was transferred to [facility] on 02/04/19 for hypotension and respiratory distress (acute respiratory failure/pulmonary edema) requiring intubation 3/10.  She was visiting family from [place] and COVID 19 tests were negative.  She was found to have group A strep bacteremia (septic shiock), rhinovirus,  and course complicated by NSTEMI and new LV dysfunction s/p cardiac cath on 02/09/19.  Other dx include ischemic cardiomyopathy, hyperkalemia, DM2- uncontrolled, and abdominal distention.  Pt with significant PMH of DM, HTN, anc CAD. Trach placed on 3/31  Subjective: pt alert, anxious Assessment / Plan / Recommendation CHL IP CLINICAL IMPRESSIONS 03/19/2019 Clinical Impression Pt has a mild oropharyngeal dysphagia felt to be secondary to prolonged and repeated intubations and overall deconditioning. She has reduced lingual manipulation and slow, incomplete oral clearance, with lingual residue increasing as boluses become more solid. Base of tongue retraction, hyolaryngeal movement, and epiglottic inversion are all reduced. This results in decreased laryngeal vestibule closure, and pt has aspiration before and during the swallow with thin and nectar thick liquids that cannot be cleared fully given intermittent spontaneous coughing. Aforementioned weakness also results in trace vallecular residue across consistencies but also with the barium tablet remaining lodged in the  valleculae, requiring multiple pureed and honey thick liquid boluses to  clear. Considering the above as well as her mentation and language barrier, limited compensatory strategies were attempted (although also not found to be effective). Recommend starting Dys 2 diet and honey thick liquids with PMV in place and full supervision provided. Will f/u for tolerance and hopeful improvement given additional therapy and time post-extubation. SLP Visit Diagnosis Dysphagia, oropharyngeal phase (R13.12) Attention and concentration deficit following -- Frontal lobe and executive function deficit following -- Impact on safety and function Moderate aspiration risk;Mild aspiration risk   [facility] IP TREATMENT RECOMMENDATION 03/19/2019 Treatment Recommendations Therapy as outlined in treatment plan below   Prognosis 03/19/2019 Prognosis for Safe Diet Advancement Good Barriers to Reach Goals -- Barriers/Prognosis Comment -- [facility] IP DIET RECOMMENDATION 03/19/2019 SLP Diet Recommendations Dysphagia 2 (Fine chop) solids;Honey thick liquids Liquid Administration via Cup;Straw Medication Administration Crushed with puree Compensations Slow rate;Small sips/bites Postural Changes Seated upright at 90 degrees   [facility] IP OTHER RECOMMENDATIONS 03/19/2019 Recommended Consults -- Oral Care Recommendations Oral care BID Other Recommendations Order thickener from pharmacy;Prohibited food (jello, ice cream, thin soups);Remove water pitcher;Place PMSV during PO intake   [facility] IP FOLLOW UP RECOMMENDATIONS 03/19/2019 Follow up Recommendations Skilled Nursing facility   [facility] IP FREQUENCY AND DURATION 03/19/2019 Speech Therapy Frequency (ACUTE ONLY) min 2x/week Treatment Duration 2 weeks      [facility] IP ORAL PHASE 03/19/2019 Oral Phase Impaired Oral - Pudding Teaspoon -- Oral - Pudding Cup -- Oral - Honey Teaspoon Reduced posterior propulsion;Lingual/palatal residue Oral - Honey Cup Reduced posterior propulsion;Lingual/palatal residue Oral - Nectar Teaspoon Reduced  posterior propulsion;Lingual/palatal residue Oral - Nectar Cup -- Oral - Nectar Straw -- Oral - Thin Teaspoon Premature spillage Oral - Thin Cup -- Oral - Thin Straw -- Oral - Puree Reduced posterior propulsion;Lingual/palatal residue Oral - Mech Soft Reduced posterior propulsion;Lingual/palatal residue;Impaired mastication Oral - Regular -- Oral - Multi-Consistency -- Oral - Pill Delayed oral transit Oral Phase - Comment --  [facility] IP PHARYNGEAL PHASE 03/19/2019 Pharyngeal Phase Impaired Pharyngeal- Pudding Teaspoon -- Pharyngeal -- Pharyngeal- Pudding Cup -- Pharyngeal -- Pharyngeal- Honey Teaspoon Reduced epiglottic inversion;Reduced anterior laryngeal mobility;Reduced laryngeal elevation;Reduced airway/laryngeal closure;Reduced tongue base retraction;Pharyngeal residue - valleculae Pharyngeal -- Pharyngeal- Honey Cup Reduced epiglottic inversion;Reduced anterior laryngeal mobility;Reduced laryngeal elevation;Reduced airway/laryngeal closure;Reduced tongue base retraction;Pharyngeal residue - valleculae Pharyngeal -- Pharyngeal- Nectar Teaspoon Reduced epiglottic inversion;Reduced anterior laryngeal mobility;Reduced laryngeal elevation;Reduced airway/laryngeal closure;Reduced tongue base retraction;Penetration/Aspiration before swallow;Pharyngeal residue - valleculae Pharyngeal Material enters airway, passes BELOW cords and not ejected out despite cough attempt by patient Pharyngeal- Nectar Cup -- Pharyngeal -- Pharyngeal- Nectar Straw -- Pharyngeal -- Pharyngeal- Thin Teaspoon Reduced epiglottic inversion;Reduced anterior laryngeal mobility;Reduced laryngeal elevation;Reduced airway/laryngeal closure;Reduced tongue base retraction;Penetration/Aspiration during swallow Pharyngeal Material enters airway, passes BELOW cords and not ejected out despite cough attempt by patient Pharyngeal- Thin Cup -- Pharyngeal -- Pharyngeal- Thin Straw -- Pharyngeal -- Pharyngeal- Puree Reduced epiglottic inversion;Reduced anterior  laryngeal mobility;Reduced laryngeal elevation;Reduced airway/laryngeal closure;Reduced tongue base retraction;Pharyngeal residue - valleculae Pharyngeal -- Pharyngeal- Mechanical Soft Reduced epiglottic inversion;Reduced anterior laryngeal mobility;Reduced laryngeal elevation;Reduced airway/laryngeal closure;Reduced tongue base retraction;Pharyngeal residue - valleculae Pharyngeal -- Pharyngeal- Regular -- Pharyngeal -- Pharyngeal- Multi-consistency -- Pharyngeal -- Pharyngeal- Pill Reduced epiglottic inversion;Reduced anterior laryngeal mobility;Reduced laryngeal elevation;Reduced airway/laryngeal closure;Reduced tongue base retraction;Pharyngeal residue - valleculae Pharyngeal -- Pharyngeal Comment --  [facility] IP CERVICAL ESOPHAGEAL PHASE 03/19/2019 Cervical Esophageal Phase WFL Pudding Teaspoon -- Pudding Cup -- Honey Teaspoon -- Honey Cup -- Nectar Teaspoon -- Nectar Cup -- Nectar Straw -- Thin Teaspoon -- Thin Cup --  Thin Straw -- Puree -- Mechanical Soft -- Regular -- Multi-consistency -- Pill -- Cervical Esophageal Comment -- [name] Nix 03/19/2019, 5:19 PM  [name], M.A. CCC-SLP Acute Rehabilitation Services Pager [phone] Office [phone]              Labs:  Basic Metabolic Panel: No results for input(s): NA, K, CL, CO2, GLUCOSE, BUN, CREATININE, CALCIUM, MG, PHOS in the last 168 hours.  CBC: No results for input(s): WBC, NEUTROABS, HGB, HCT, MCV, PLT in the last 168 hours.  CBG: Recent Labs  Lab 04/03/19 0656 04/03/19 1147 04/03/19 1632 04/03/19 1653 04/03/19 2132  GLUCAP 153* 211* 45* 126* 148*   Family history.  Parents with hypertension.  Denies any diabetes or cancer.  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   Per chart review patient is from [place] was visiting family in the area.  Reportedly independent prior to admission.  Presented 02/03/2019 with fever as well as cough to [facility] and was transferred to [facility] 02/04/2019 for hypertension septic shock respiratory distress requiring intubation.  She was maintained on IV pressors.  Lactic acid 1.7, COVID test negative.  She was found to have group A strep bacteremia with septic shock.  Hospital course complicated by non-STEMI identified per EKG.  Echocardiogram consistent with cardiomyopathy ejection fraction 30 to 40% undergoing cardiac catheterization 02/09/2019 per Dr. Verdis Prime showing 20 to 30% diffuse narrowing within the proximal LAD stent and diffuse narrowing in the distal LAD apical segment, patent stent in the mid to distal obtuse marginal with 89% stenosis distal to the stent with active thrombosis noted and maintained on aspirin.  Bouts of atrial fibrillation RVR continue on aspirin therapy as well as the addition of amiodarone.  Due to ongoing respiratory failure with hypoxemia failed extubation underwent tracheostomy placement 02/24/2019 per Dr. Molli Knock and downsized to a #4 cuffless.  Maintained on dysphagia #2 honey thick liquid diet.  Patient undergoing capping with ultimate plans to decannulate.  Acute on chronic combined systolic diastolic congestive heart failure remained on Lasix.  Acute on chronic anemia 8.1-9.0.  Palliative care consulted to establish goals of care.  Patient was admitted for comprehensive rehab program.  Hospital Course: Geneviene Tesch was admitted to rehab 03/27/2019 for inpatient therapies to consist of PT, ST and OT at least three hours five days a week. Past admission physiatrist, therapy team and rehab RN have worked together to provide customized collaborative inpatient rehab.  Pertaining to patient's debility related to septic shock congestive heart failure remained stable she would follow-up  with cardiology services.  She remained on aspirin therapy.  Mood stabilization with Zoloft as well as trazodone with Xanax scheduled and emotional support provided.  Respiratory failure status post tracheostomy 02/24/2019 downsized to a #4 cuffless and decannulated 04/03/2019 oxygen saturations greater than 90%.  She exhibited no other signs of fluid overload she remained on Lasix therapy.  A follow-up chest x-ray showed cardiomegaly with bilateral effusions and bibasilar atelectasis.  Pulmonary services was contacted to review scan advised follow-up with PCP and continue with diuretic.  Atrial fibrillation with RVR amiodarone as directed again she would follow cardiology services.  Her diet was slowly advanced to regular consistency.  Acute on chronic anemia 9.3 and stable.  Blood sugars with some variables hemoglobin A1c 10.1 insulin therapy as directed with full diabetic teaching.  Lipitor ongoing for hyperlipidemia.  Physical exam blood pressure 119/56 pulse 85 temperature 98.6 respiration 16 oxygen saturations 100%. Constitutional.  Well-developed HEENT Eyes EOMs intact no discharge without nystagmus Neck.  Supple nontender no JVD without thyromegaly Respiratory good inspiratory effort clear to auscultation at the bases Cardiac irregular irregular GI abdomen soft nontender without rebound Neurological patient sitting up in chair made good eye contact limited English speaking moves all extremities  Rehab course: During patient's stay in rehab weekly team conferences were held to monitor patient's progress, set goals and discuss barriers to discharge. At admission, patient required total assist lateral scoot transfers moderate assist stand pivot transfers minimal assist sit to supine and moderate assist side-lying to sitting.  She was ambulating 40 feet with a rolling walker.  Simple set up for upper body bathing moderate assist lower body bathing mod assist toilet transfers  She  has had improvement  in activity tolerance, balance, postural control as well as ability to compensate for deficits. She has had improvement in functional use RUE/LUE  and RLE/LLE as well as improvement in awareness.  Working with energy conservation techniques.  Ambulates up to 80 feet with rest breaks as needed.  Ambulates in her room with rolling walker supervision for mobility.  Completes car transfers at sedan simulated height supervision using rolling walker.  In and out of car demonstrating fair awareness needing some cues.  Patient able to walk from her room to the day room to complete ADLs gather her belongings.  She can communicate her needs.  For family teaching was completed and plan discharge to home       Disposition: Discharge disposition: 01-Home or Self Care     Discharge to home   Diet: Regular consistency  Special Instructions: No driving smoking or alcohol  Follow-up [facility] for any cardiology needs   Medications at discharge 1 Plavix 75 mg daily 2 Lipitor 80 mg daily 3.  Xanax 0.25 mg p.o. twice daily 4.  Amiodarone 200 mg p.o. daily 5.  Aspirin 81 mg p.o. daily 6.  Lipitor 40 mg p.o. daily 7.  Coreg 3.125 mg p.o. twice daily 8.  Lasix 40 mg p.o. daily 9.  Mucinex 600 mg p.o. twice daily 10.  Hydralazine 10 mg p.o. twice daily 11.  Lantus insulin 5 units daily and 15 units nightly 12.  Imdur 30 mg p.o. daily 13.  Cozaar 25 mg p.o. daily 14.  Singulair 10 mg p.o. nightly 15.  Multivitamin daily 16.  Protonix 40 mg p.o. daily 17.  MiraLAX twice daily hold for loose stools 18.  Senokot S2 tablets daily as needed 19.  Zoloft 50 mg p.o. daily 20.  Aldactone 12.5 mg p.o. daily 21.  Desyrel 50 mg p.o. nightly    Follow-up Information    Ranelle Oyster, MD Follow up.   Specialty:  Physical Medicine and Rehabilitation Why:  only as needed Contact information: [address] [place] [address] [phone]        Alyson Reedy, MD Follow up.    Specialty:  Pulmonary Disease Why:  Call for appointment Contact information: [address] [place] [address] [phone]           Signed: Charlton Amor 04/04/2019, 5:31 AM

## 2019-04-03 NOTE — Progress Notes (Signed)
Social Work Patient ID: Gabrielle Wright, female   DOB: October 10, 1946, 73 y.o.   MRN: [id number]   Spoke with pt and daughter yesterday to review team conference and alert to d/c date of 5/9 and supervision goals overall.  Have set up for family ed with daughter & son-in-law to be here tomorrow @ 1:00pm.  Daughter called me back today to request that we keep pt here another week as she is concerned that "the hole" is there in her neck from trach removal and she is worried about the pt being around her young children.  Attempted to explain that this is not a reason to extend her LOS and that she can receive more information tomorrow and, hopefully, feel more reassured.  Continue to follow.  Chi Garlow, LCSW

## 2019-04-03 NOTE — Progress Notes (Signed)
Physical Therapy Session Note  Patient Details  Name: Gabrielle Wright MRN: [id number] Date of Birth: [birth year]  Today's Date: 04/03/2019 PT Individual Time:Session1: 8469-6295; Session2:1418-1450 PT Individual Time Calculation (min): Session1:57 min; Session2:32 min  Short Term Goals: Week 1:  PT Short Term Goal 1 (Week 1): STG = LTG due to short ELOS.  Skilled Therapeutic Interventions/Progress Updates:   Session1: Patient in recliner in room.  RN reports just got trach out.  Patient c/o too much air leaking from covered stoma.  RN reports she needs to put pressure on the dressing to improve seal.   Patient requested another bandage; educated may not help, but placed at pt request.   Patient sit to stand S and ambulated with rollator toward door.  Placed w/c behind and pushed pt to dayroom.  Gait with rollator x 80' around chairs with mod cues and CGA at times for changing directions due to increased time for video interpreter to explain activity.  Patient performed Berg balance assessment as noted below.  Multiple seated rest breaks required between activities.  Patient scored 31/56.  Seems uncomfortable with moving much without UE support.  Patient assisted to room in w/c and stand step no walker to recliner.  Patient left with chair alarm activated and call bell in reach.  [employer] video interpreter used for communication throughout session.  Session2:Patient in recliner, reports exhausted due to not sleeping well past 2 nights as when lowering her head she has coughing issues.  Wondering whether she will be on oxygen at home and if she can eat solid food.  Reports able to eat breakfast, but not able to eat dinner or lunch due to no appetite.  Patient educated through interpreter on option of purchasing a wedge from medical supply store for sleeping in bed at home as she doesn't really have a chair with elevating legrest and educated needs to have her feet elevated some of the time.  Patient also  educated her stoma from trach will close over and she will no longer have difficulty with air loss. Patient pushed in w/c to therapy gym to negotiate 4 steps with rails and CGA for safety as pt reported feeling weaker today.  Patient fatigued and unable to complete 12 steps as previous sessions.  Patient assisted to room in w/c and transferred to recliner no device with close S.  Left seated in recliner with seat alarm activated and call bell in reach.  Relayed to RN patient's concerns about going home (oxygen, sleeping, etc).    Therapy Documentation Precautions:  Precautions Precautions: Fall Precaution Comments: trach capped on 5/4 (use nasal cannula if pt needs supplemental oxygen) Restrictions Weight Bearing Restrictions: No Pain: Pain Assessment Pain Scale: Faces Pain Score: 1  Faces Pain Scale: Hurts little more Pain Type: Acute pain Pain Location: Head Pain Orientation: Anterior Pain Descriptors / Indicators: Headache Pain Frequency: Intermittent Pain Onset: Gradual Patients Stated Pain Goal: 0 Pain Intervention(s): Medication (See eMAR)  Balance: Balance Balance Assessed: Yes Standardized Balance Assessment Standardized Balance Assessment: Berg Balance Test Berg Balance Test Sit to Stand: Able to stand  independently using hands Standing Unsupported: Able to stand 2 minutes with supervision Sitting with Back Unsupported but Feet Supported on Floor or Stool: Able to sit safely and securely 2 minutes Stand to Sit: Controls descent by using hands Transfers: Able to transfer with verbal cueing and /or supervision Standing Unsupported with Eyes Closed: Able to stand 10 seconds with supervision Standing Ubsupported with Feet Together: Able to place  feet together independently and stand for 1 minute with supervision From Standing, Reach Forward with Outstretched Arm: Can reach forward >5 cm safely (2") From Standing Position, Pick up Object from Floor: Able to pick up shoe,  needs supervision From Standing Position, Turn to Look Behind Over each Shoulder: Needs supervision when turning Turn 360 Degrees: Needs assistance while turning Standing Unsupported, Alternately Place Feet on Step/Stool: Able to complete >2 steps/needs minimal assist Standing Unsupported, One Foot in Front: Able to take small step independently and hold 30 seconds Standing on One Leg: Tries to lift leg/unable to hold 3 seconds but remains standing independently Total Score: 31    Therapy/Group: Individual Therapy  Elray Mcgregor  Sheran Lawless, PT 04/03/2019, 12:42 PM

## 2019-04-03 NOTE — Progress Notes (Signed)
Patient's trach was removed per MD order with RT & RN at the bedside. Stoma was covered with soft surgical tape & gauze. Interpreter was used to explain to the patient what was occurring. Patient is doing well at this time & does not have any questions for RT.

## 2019-04-04 ENCOUNTER — Ambulatory Visit (HOSPITAL_COMMUNITY): Payer: Medicaid Other | Admitting: Physical Therapy

## 2019-04-04 ENCOUNTER — Encounter (HOSPITAL_COMMUNITY): Payer: Medicaid Other | Admitting: Speech Pathology

## 2019-04-04 ENCOUNTER — Inpatient Hospital Stay (HOSPITAL_COMMUNITY): Payer: Medicaid Other | Admitting: Physical Therapy

## 2019-04-04 ENCOUNTER — Encounter (HOSPITAL_COMMUNITY): Payer: Medicaid Other

## 2019-04-04 ENCOUNTER — Inpatient Hospital Stay (HOSPITAL_COMMUNITY): Payer: Medicaid Other

## 2019-04-04 DIAGNOSIS — J9611 Chronic respiratory failure with hypoxia: Secondary | ICD-10-CM

## 2019-04-04 LAB — BASIC METABOLIC PANEL
Anion gap: 13 (ref 5–15)
BUN: 14 mg/dL (ref 8–23)
CO2: 24 mmol/L (ref 22–32)
Calcium: 9.1 mg/dL (ref 8.9–10.3)
Chloride: 100 mmol/L (ref 98–111)
Creatinine, Ser: 1.11 mg/dL — ABNORMAL HIGH (ref 0.44–1.00)
GFR calc Af Amer: 57 mL/min — ABNORMAL LOW (ref >60)
GFR calc non Af Amer: 50 mL/min — ABNORMAL LOW (ref >60)
Glucose, Bld: 245 mg/dL — ABNORMAL HIGH (ref 70–99)
Potassium: 4.6 mmol/L (ref 3.5–5.1)
Sodium: 137 mmol/L (ref 135–145)

## 2019-04-04 LAB — CBC
HCT: 30 % — ABNORMAL LOW (ref 36.0–46.0)
Hemoglobin: 9 g/dL — ABNORMAL LOW (ref 12.0–15.0)
MCH: 25.7 pg — ABNORMAL LOW (ref 26.0–34.0)
MCHC: 30 g/dL (ref 30.0–36.0)
MCV: 85.7 fL (ref 80.0–100.0)
Platelets: 317 10*3/uL (ref 150–400)
RBC: 3.5 MIL/uL — ABNORMAL LOW (ref 3.87–5.11)
RDW: 16.3 % — ABNORMAL HIGH (ref 11.5–15.5)
WBC: 8.3 10*3/uL (ref 4.0–10.5)
nRBC: 0 % (ref 0.0–0.2)

## 2019-04-04 LAB — GLUCOSE, CAPILLARY
Glucose-Capillary: 115 mg/dL — ABNORMAL HIGH (ref 70–99)
Glucose-Capillary: 188 mg/dL — ABNORMAL HIGH (ref 70–99)
Glucose-Capillary: 183 mg/dL — ABNORMAL HIGH (ref 70–99)
Glucose-Capillary: 122 mg/dL — ABNORMAL HIGH (ref 70–99)

## 2019-04-04 MED ORDER — FUROSEMIDE 20 MG PO TABS
20.0000 mg | ORAL_TABLET | Freq: Once | ORAL | Status: AC
  Administered 2019-04-04: 20 mg via ORAL
  Filled 2019-04-04: qty 1

## 2019-04-04 MED ORDER — LIVING WELL WITH DIABETES BOOK
Freq: Once | Status: AC
  Administered 2019-04-04: 16:00:00
  Filled 2019-04-04: qty 1

## 2019-04-04 MED ORDER — INSULIN GLARGINE 100 UNIT/ML SOLOSTAR PEN: 15.0000 [IU] | PEN_INJECTOR | Freq: Every day | SUBCUTANEOUS | 11 refills | Status: DC

## 2019-04-04 MED ORDER — ALBUTEROL SULFATE HFA 108 (90 BASE) MCG/ACT IN AERS: 2.0000 | INHALATION_SPRAY | Freq: Four times a day (QID) | RESPIRATORY_TRACT | 1 refills | Status: AC | PRN

## 2019-04-04 MED ORDER — FUROSEMIDE 40 MG PO TABS: 40.0000 mg | ORAL_TABLET | Freq: Once | ORAL | Status: DC

## 2019-04-04 MED ORDER — INSULIN GLARGINE 100 UNIT/ML SOLOSTAR PEN: 15.0000 [IU] | PEN_INJECTOR | Freq: Every day | SUBCUTANEOUS | 11 refills | Status: AC

## 2019-04-04 MED ORDER — INSULIN GLARGINE 100 UNIT/ML SOLOSTAR PEN: 5.0000 [IU] | PEN_INJECTOR | Freq: Every day | SUBCUTANEOUS | 11 refills | Status: AC

## 2019-04-04 NOTE — Progress Notes (Signed)
Speech Language Pathology Discharge Summary  Patient Details  Name: Gabrielle Wright MRN: [id number] Date of Birth: 17-Mar-1946  Today's Date: 04/04/2019 SLP Individual Time: 1400-1420 SLP Individual Time Calculation (min): 20 min   Skilled Therapeutic Interventions:  Skilled treatment session focused on dysphagia goals and completion of caregiver education with pt's family. Education provided to pt's family on recent MBS, results and decanulation. Pt consumed thin liquids without overt s/s of aspiration. All questions answered to family's satisfaction.     Patient has met 3 of 3 long term goals.  Patient to discharge at overall Modified Independent level.    Clinical Impression/Discharge Summary:   Pt has made progress in skilled ST sessions and as a result she is appropriate to consume regular diet with thin liquids. Pt has also been decannulated and education as been completed on placing 2 fingers over the stoma bandage.   Care Partner:  Caregiver Able to Provide Assistance: Yes     Recommendation:  None       Reasons for discharge: Treatment goals met;Discharged from hospital   Patient/Family Agrees with Progress Made and Goals Achieved: Yes    Gabrielle Wright 04/04/2019, 4:28 PM

## 2019-04-04 NOTE — Progress Notes (Signed)
Physical Therapy Discharge Summary  Patient Details  Name: Gabrielle Wright MRN: [id number] Date of Birth: 08-26-1946  Today's Date: 04/04/2019 PT Individual Time: 1307-1400 PT Individual Time Calculation (min): 53 min    Patient has met 9 of 9 long term goals due to improved activity tolerance, improved balance, improved postural control, increased strength, ability to compensate for deficits, improved awareness and improved coordination.  Patient to discharge at an ambulatory level supervision with rollator.   Patient's care partner is independent to provide the necessary physical and cognitive assistance at discharge.  Reasons goals not met: n/a  Recommendation:  Patient will benefit from ongoing skilled PT services in home health setting to continue to advance safe functional mobility, address ongoing impairments in balance, endurance, strengthening, activity tolerance, gait, stair negotiation, and minimize fall risk.  Equipment: rollator  Reasons for discharge: treatment goals met and discharge from hospital  Patient/family agrees with progress made and goals achieved: Yes  Skilled PT Treatment: Pt received in recliner & agreeable to tx, denying c/o pain & on room air throughout session with SpO2 >90% throughout. No family present for caregiver training upon PT arrival & utilized stratus interpreter to communicate with pt. Pt dons shoes with setup assist then family arrives & interprets the remainder of session. Therapist educated pt's family on home modifications to reduce tripping hazards (remove/secure throw rugs), need for pt to stay active & ambulate around house daily, f/u HHPT & 24 hr supervision recommendations, pursed lip breathing, oxygen levels WNL with pt on room air, need to apply pressure to stoma when talking to promote closure, and need to sit then place BLE in/out of car vs stepping in. Also educated them on pt's need for close supervision during ambulation with rollator, how  to manage rollator brakes for increased safety with sit<>stand, and how to assist pt with lifting rollator up small 3" step into home & need for close supervision when negotiating threshold into house. Pt's daughter & son in law voiced understanding of all information & deny concerns regarding pt's physical mobility & CLOF with d/c set for tomorrow. Pt ambulates room<>ortho gym with rollator & supervision with rest breaks PRN (pt does ambulate 172 in first bout before requiring seated rest break). Pt completes car transfer at sedan simulated height with supervision. At end of session pt left sitting in recliner with family present.   Also educated family on pt's intolerance of sleeping supine in bed & suggested they either purchase wedge or prop pt up in bed with pillows or pt sleep on couch.   PT Discharge Precautions/Restrictions Precautions Precautions: Fall Precaution Comments: trach removed on 5/7 Restrictions Weight Bearing Restrictions: No  Vision/Perception  Pt wears glasses at all times at baseline. Pt with hx of cataract surgery on one eye. No apparent visual deficits. Perception WNL.  Cognition Overall Cognitive Status: Within Functional Limits for tasks assessed Arousal/Alertness: Awake/alert Memory: Impaired Memory Impairment: Decreased recall of new information Awareness: Impaired Awareness Impairment: Anticipatory impairment Safety/Judgment: Impaired   Sensation Sensation Light Touch: Not tested Proprioception: Appears Intact(by gross assessment) Coordination Gross Motor Movements are Fluid and Coordinated: Yes   Motor  Motor Motor: Abnormal postural alignment and control Motor - Discharge Observations: generalized deconditioning   Mobility Pt is able to transfer sitting EOB<>long sitting in bed with independence in hospital bed. Pt declines lying flat, reporting she cannot breathe in this position. Transfers Transfers: Sit to Stand;Stand to Sit Sit to Stand:  Independent with assistive device Stand to Sit: Independent with  assistive device Transfer (Assistive device): (rollator)   Locomotion  Gait Ambulation: Yes Gait Assistance: Supervision/Verbal cueing Gait Distance (Feet): 172 Feet Assistive device: (rollator) Gait Gait: Yes Gait Pattern: Decreased step length - left;Decreased stride length;Decreased step length - right Gait velocity: decreased Stairs / Additional Locomotion Stairs: Yes Stairs Assistance: Supervision/Verbal cueing Stair Management Technique: Two rails Number of Stairs: 12 Height of Stairs: (6" + 3") Wheelchair Mobility Wheelchair Mobility: No   Trunk/Postural Assessment  Lumbar Assessment Lumbar Assessment: Exceptions to WFL(posterior pelvic tilt) Postural Control Postural Control: Deficits on evaluation Righting Reactions: delayed Protective Responses: delayed   Balance Balance Balance Assessed: Yes Dynamic Standing Balance Dynamic Standing - Balance Support: No upper extremity supported;During functional activity Dynamic Standing - Level of Assistance: 5: Stand by assistance  Berg Balance Test = 31/56 on 04/03/2019  Extremity Assessment  All extremities WNL. Strength not formally tested.   Waunita Schooner 04/04/2019, 3:50 PM

## 2019-04-04 NOTE — Discharge Instructions (Signed)
Inpatient Rehab Discharge Instructions  Xitlalic Scarbrough Discharge date and time: No discharge date for patient encounter.   Activities/Precautions/ Functional Status: Activity: activity as tolerated Diet: diabetic diet Wound Care: keep wound clean and dry Functional status:  ___ No restrictions     ___ Walk up steps independently ___ 24/7 supervision/assistance   ___ Walk up steps with assistance ___ Intermittent supervision/assistance  ___ Bathe/dress independently ___ Walk with walker     _x__ Bathe/dress with assistance ___ Walk Independently    ___ Shower independently ___ Walk with assistance    ___ Shower with assistance ___ No alcohol     ___ Return to work/school ________     COMMUNITY REFERRALS UPON DISCHARGE:    Home Health:   PT     OT     RN                        Agency:  [facility] Phone: [phone]   Medical Equipment/Items Ordered: rollator walker, 3n1 commode and tub bench                                                     Agency/Supplier:  [facility] @ [phone]  PCS Aide referral placed with [facility] who will be contacting you to set up assessment to begin services.   Special Instructions: Follow-up [facility] healthcare for any cardiology needs   My questions have been answered and I understand these instructions. I will adhere to these goals and the provided educational materials after my discharge from the hospital.  Patient/Caregiver Signature _______________________________ Date __________  Clinician Signature _______________________________________ Date __________  Please bring this form and your medication list with you to all your follow-up doctor's appointments.

## 2019-04-04 NOTE — Progress Notes (Addendum)
Gabrielle Wright PHYSICAL MEDICINE & REHABILITATION PROGRESS NOTE   Subjective/Complaints: Happy that trach is out. Asked if she needs to hold a finger over the stoma. Reports SOB when she lies down.   ROS: limited due to language/communication .   Objective:   Dg Swallowing Func-speech Pathology  Result Date: 04/03/2019 Objective Swallowing Evaluation: Type of Study: MBS-Modified Barium Swallow Study  Patient Details Name: Gabrielle Wright MRN: [id number] Date of Birth: 12/05/1945 Today's Date: 04/03/2019 Time: SLP Start Time (ACUTE ONLY): 1135 -SLP Stop Time (ACUTE ONLY): 1150 SLP Time Calculation (min) (ACUTE ONLY): 15 min Past Medical History: Past Medical History: Diagnosis Date . Coronary artery disease  . Diabetes mellitus without complication (HCC)  . Hypertension  Past Surgical History: Past Surgical History: Procedure Laterality Date . CORONARY ANGIOPLASTY WITH STENT PLACEMENT   . CORONARY/GRAFT ACUTE MI REVASCULARIZATION N/A 02/09/2019  Procedure: Coronary/Graft Acute MI Revascularization;  Surgeon: Lyn RecordsSmith, Henry W, MD;  [facility];  Service: Cardiovascular;  Laterality: N/A; . LEFT HEART CATH AND CORONARY ANGIOGRAPHY N/A 02/09/2019  Procedure: LEFT HEART CATH AND CORONARY ANGIOGRAPHY;  Surgeon: Lyn RecordsSmith, Henry W, MD;  Location: [facility] INVASIVE CV LAB;  Service: Cardiovascular;  Laterality: N/A; HPI: 73 y.o. female admitted on 02/03/19 for fever and cough (to [facility] [place]) and was [facility] on 02/04/19 for hypotension and respiratory distress (acute respiratory failure/pulmonary edema) requiring intubation 3/10.  [place] family from [place] and COVID19 tests were negative.  She was found to have group A strep bacteremia (septic shock), and rhinovirus. Course complicated by NSTEMI and new LV dysfunction s/p cardiac cath on 02/09/19.  Other dx include ischemic cardiomyopathy, hyperkalemia, DM2- uncontrolled, and abdominal distention.  Pt with significant PMH of DM, HTN, anc  CAD. Trach placed on 3/31, currently capped with plan for removal today.  Subjective: pt alert, cooperative Assessment / Plan / Recommendation CHL IP CLINICAL IMPRESSIONS 04/03/2019 Clinical Impression Pt continues to demonstrate improvement in pharyngeal swallow since prior MBS 03/28/2019. [facility] Pharyngeal stage mildly impaired, with delayed swallow reflex noted on thin liquids only. Penetration was seen before the swallow (x1 on initial straw sip), and during the swallow (intermittently on large consecutive cup sips), however, penetrate cleared spontaneously and completely, and was not aspirated.  Smaller sips were not penetrated. No post-swallow residue on any consistency. Barium tablet was given with water, and cleared the oropharynx without difficulty or delay. Recommend advancing diet to regular solids and thin liquids. Pt will require education regarding the importance of taking small sips of liquids. Gabrielle [facility] is currently capped, with plan to decannulate pt today. SLP will follow to provide education regarding advanced diet and safe swallow precautions. Updated precautions posted in room, and team notified of changes. SLP Visit Diagnosis Dysphagia, pharyngeal phase (R13.13) Impact on safety and function Mild aspiration risk   CHL IP TREATMENT RECOMMENDATION 04/03/2019 Treatment Recommendations Therapy as outlined in treatment plan below   Prognosis 04/03/2019 Prognosis for Safe Diet Advancement Good Barriers to Reach Goals Language barrier   CHL IP DIET RECOMMENDATION 04/03/2019 SLP Diet Recommendations Regular solids;Thin liquid Liquid Administration via Cup;Straw Medication Administration Whole meds with liquid Compensations Minimize environmental distractions;Slow rate;Small sips/bites Postural Changes Remain semi-upright after after feeds/meals (Comment)   CHL IP OTHER RECOMMENDATIONS 04/03/2019 Recommended Consults -- Oral Care Recommendations Oral care BID Other Recommendations --   CHL IP  FOLLOW UP RECOMMENDATIONS 04/03/2019 Follow up Recommendations None   CHL IP FREQUENCY AND DURATION 03/19/2019 Speech Therapy  Frequency (ACUTE ONLY) min 2x/week Treatment Duration 2 weeks      CHL IP ORAL PHASE 04/03/2019 Oral Phase WFL  CHL IP PHARYNGEAL PHASE 04/03/2019 Pharyngeal Phase Impaired Pharyngeal- Pudding Teaspoon -- Pharyngeal -- Pharyngeal- Pudding Cup -- Pharyngeal -- Pharyngeal- Honey Teaspoon NT Pharyngeal -- Pharyngeal- Honey Cup NT Pharyngeal -- Pharyngeal- Nectar Teaspoon NT Pharyngeal -- Pharyngeal- Nectar Cup WFL Pharyngeal -- Pharyngeal- Nectar Straw -- Pharyngeal -- Pharyngeal- Thin Teaspoon NT Pharyngeal -- Pharyngeal- Thin Cup Delayed swallow initiation-vallecula;Reduced airway/laryngeal closure;Penetration/Aspiration during swallow Pharyngeal Material enters airway, remains ABOVE vocal cords then ejected out;Material does not enter airway Pharyngeal- Thin Straw -- Pharyngeal -- Pharyngeal- Puree WFL Pharyngeal -- Pharyngeal- Mechanical Soft WFL Pharyngeal -- Pharyngeal- Regular -- Pharyngeal -- Pharyngeal- Multi-consistency -- Pharyngeal -- Pharyngeal- Pill WFL Pharyngeal -- Pharyngeal Comment --  CHL IP CERVICAL ESOPHAGEAL PHASE 04/03/2019 Cervical Esophageal Phase [facility], Gabrielle Wright, CCC-SLP Speech Language Pathologist Leigh Aurora 04/03/2019, 10:01 AM              No results for input(s): WBC, HGB, HCT, PLT in the last 72 hours. No results for input(s): NA, K, CL, CO2, GLUCOSE, BUN, CREATININE, CALCIUM in the last 72 hours.  Intake/Output Summary (Last 24 hours) at 04/04/2019 0853 Last data filed at 04/03/2019 1846 Gross per 24 hour  Intake 238 ml  Output -  Net 238 ml     Physical Exam: Vital Signs Blood pressure (!) 134/58, pulse 81, temperature 97.7 F (36.5 C), temperature source Oral, resp. rate 18, height 5' 4.02" (1.626 m), weight 74.1 kg, SpO2 95 %. Constitutional: No distress . Vital signs reviewed. HEENT: EOMI, oral membranes moist Neck: supple. Trach stoma  may already be closed Cardiovascular: RRR without murmur. No JVD    Respiratory: extra work of breathing when talking. No obvious wheezes or rales on auscultation    GI: BS +, non-tender, non-distended  Musc: tr to 1+ edema bilateral ankles. Neuro: Alert Motor: Grossly 4-/5 throughout Psych: in good spirits.   Assessment/Plan:  1. Functional deficits secondary to debility after sepsis which require 3+ hours per day of interdisciplinary therapy in a comprehensive inpatient rehab setting.  Physiatrist is providing close team supervision and 24 hour management of active medical problems listed below.  Physiatrist and rehab team continue to assess barriers to discharge/monitor patient progress toward functional and medical goals  Care Tool:  Bathing  Bathing activity did not occur: Refused Body parts bathed by patient: Right arm, Left arm, Chest, Abdomen, Front perineal area, Buttocks, Right upper leg, Left upper leg, Face, Right lower leg, Left lower leg   Body parts bathed by helper: Right lower leg, Left lower leg     Bathing assist Assist Level: Supervision/Verbal cueing     Upper Body Dressing/Undressing Upper body dressing   What is the patient wearing?: Pull over shirt    Upper body assist Assist Level: Supervision/Verbal cueing    Lower Body Dressing/Undressing Lower body dressing      What is the patient wearing?: Pants     Lower body assist Assist for lower body dressing: Supervision/Verbal cueing     Toileting Toileting    Toileting assist Assist for toileting: Contact Guard/Touching assist     Transfers Chair/bed transfer  Transfers assist     Chair/bed transfer assist level: Supervision/Verbal cueing     Locomotion Ambulation   Ambulation assist      Assist level: Contact Guard/Touching assist(for changing direction due to language barrier) Assistive device: Rollator Max distance:  80'   Walk 10 feet activity   Assist     Assist  level: Supervision/Verbal cueing Assistive device: Rollator   Walk 50 feet activity   Assist Walk 50 feet with 2 turns activity did not occur: Safety/medical concerns  Assist level: Contact Guard/Touching assist Assistive device: Rollator    Walk 150 feet activity   Assist Walk 150 feet activity did not occur: Safety/medical concerns         Walk 10 feet on uneven surface  activity   Assist Walk 10 feet on uneven surfaces activity did not occur: Safety/medical concerns         Wheelchair     Assist Will patient use wheelchair at discharge?: No Type of Wheelchair: Manual    Wheelchair assist level: Moderate Assistance - Patient 50 - 74% Max wheelchair distance: 30 ft     Wheelchair 50 feet with 2 turns activity    Assist    Wheelchair 50 feet with 2 turns activity did not occur: Safety/medical concerns       Wheelchair 150 feet activity     Assist Wheelchair 150 feet activity did not occur: Safety/medical concerns        Medical Problem List and Plan: 1.Debilitysecondary to septic shock/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 Antithrombotics: -DVT/anticoagulation:SCDs -antiplatelet therapy: Aspirin 81 mg daily 3. Pain Management:Tylenol as needed 4. Mood:Zoloft 50 mg daily, trazodone 50 mg nightly, Ativan 0.5 mg as needed   Scheduled xanax added which seems to have helped greatly -antipsychotic agents: N/A    5. Neuropsych: This patientiscapable of making decisions onherown behalf. 6. Skin/Wound Care:Routine skin checks 7. Fluids/Electrolytes/Nutrition:encourage PO 8.Respiratory failure. Status post tracheostomy 02/24/2019, dec 5/7  -trach out, no problems, stoma almost closed if not closed already   9.Non-STEMI. Continue aspirin. Follow-up cardiology services 10.Acute on chronic  combined congestive heart failure. Continue Lasix 40 mg daily.   -mild LE edema with orthopnea  -no weight recorded yesterday or today   -weigh today  -check CXR  -addnl 40mg  lasix at 1200  -?anxiety component Filed Weights   03/31/19 0407 04/01/19 0521 04/02/19 0500  Weight: 76.1 kg 77.1 kg 74.1 kg    11.Atrial fibrillation with RVR. Amiodarone 200 mg daily. Cardiac rate controlled at present 12.Hypertension. Coreg 3.125 mg twice daily, hydralazine 10 mg twice daily, Imdur 30 mg daily, Cozaar 25 mg daily, Aldactone 12.5 mg daily  Controlled 5/7 13. Dysphagia. currently on D3 nectars  Continue to advance as tolerated 14. CKD stage III.   Creatinine 1.10 on 5/1---recheck today  Continue to monitor 15. Acute on chronic anemia.   Hemoglobin 9.3 on 5/1  Continue to monitor 16. Diabetes mellitus. Hemoglobin A1c 10.1. Lantus insulin 15 units nightly.    changed lantus to 5u am and 15u pm 5/6   -improved control 17. Hyperlipidemia. Lipitor   LOS: 8 days A FACE TO FACE EVALUATION WAS PERFORMED  Ranelle Oyster 04/04/2019, 8:53 AM

## 2019-04-04 NOTE — Progress Notes (Signed)
Social Work  Discharge Note  The overall goal for the admission was met for:   Discharge location: Yes - home with daughter and son-in-law  Length of Stay: Yes - 9 days (with discharge on 04/05/19)  Discharge activity level: Yes - supervision overall  Home/community participation: Yes  Services provided included: MD, RD, PT, OT, SLP, RN, TR, Pharmacy and SW  Financial Services: Medicaid  Follow-up services arranged: Home Health: RN, PT, OT via [place], DME: rollator walker, 3n1 commode and tub bench via [place] and Patient/Family has no preference for HH/DME agencies  Comments (or additional information):       Contact info:  Daughter, Lottie Dawson @ [phone]  Patient/Family verbalized understanding of follow-up arrangements: Yes  Individual responsible for coordination of the follow-up plan: pt/daughter  Confirmed correct DME delivered: Lennart Pall 04/04/2019    Gabrielle Wright

## 2019-04-04 NOTE — Progress Notes (Signed)
NAME:  Gabrielle [facility], MRN:  [id number], DOB:  1946-11-08, LOS: 8 ADMISSION DATE:  03/27/2019, CONSULTATION DATE:  04/04/2019 REFERRING MD:  Riley Kill, CHIEF COMPLAINT:  Dyspnea.   HPI/course in hospital  Asked to reassess prior to discharge.  Patient reports orthopnea.  Well known to our service for prolonged respiratory failure from pneumonia complicated by demand-related cardiomyopathy.  Eventually weaned from ventilation and was transferred to CIR. Recently decannulated. Reports increased orthopnea since. Anxiety much improved.  Past Medical History   Past Medical History:  Diagnosis Date  . Coronary artery disease   . Diabetes mellitus without complication (HCC)   . Hypertension      Past Surgical History:  Procedure Laterality Date  . CORONARY ANGIOPLASTY WITH STENT PLACEMENT    . CORONARY/GRAFT ACUTE MI REVASCULARIZATION N/A 02/09/2019   Procedure: Coronary/Graft Acute MI Revascularization;  Surgeon: Lyn Records, MD;  Location: [facility] INVASIVE CV LAB;  Service: Cardiovascular;  Laterality: N/A;  . LEFT HEART CATH AND CORONARY ANGIOGRAPHY N/A 02/09/2019   Procedure: LEFT HEART CATH AND CORONARY ANGIOGRAPHY;  Surgeon: Lyn Records, MD;  Location: [facility] INVASIVE CV LAB;  Service: Cardiovascular;  Laterality: N/A;    Interim history/subjective:  No voiced complaints  Objective   Blood pressure (!) 134/58, pulse 81, temperature 97.7 F (36.5 C), temperature source Oral, resp. rate 18, height 5' 4.02" (1.626 m), weight 74.5 kg, SpO2 95 %.        Intake/Output Summary (Last 24 hours) at 04/04/2019 1309 Last data filed at 04/04/2019 1239 Gross per 24 hour  Intake 794 ml  Output -  Net 794 ml   Filed Weights   04/01/19 0521 04/02/19 0500 04/04/19 1300  Weight: 77.1 kg 74.1 kg 74.5 kg    Examination: Physical Exam Constitutional:      General: She is not in acute distress.    Appearance: She is not ill-appearing.  HENT:     Head: Normocephalic and atraumatic.     Mouth/Throat:      Comments: Tracheostomy site intact. Neck:     Musculoskeletal: Normal range of motion.  Cardiovascular:     Rate and Rhythm: Normal rate and regular rhythm.     Comments: No JVD Pulmonary:     Effort: Pulmonary effort is normal.     Breath sounds: Normal breath sounds.  Musculoskeletal:        General: No swelling.     Right lower leg: Edema present.     Left lower leg: Edema present.  Skin:    General: Skin is warm.  Neurological:     Mental Status: She is alert.      Ancillary tests (personally reviewed)  CBC: Recent Labs  Lab 04/04/19 1017  WBC 8.3  HGB 9.0*  HCT 30.0*  MCV 85.7  PLT 317    Basic Metabolic Panel: Recent Labs  Lab 04/04/19 1017  NA 137  K 4.6  CL 100  CO2 24  GLUCOSE 245*  BUN 14  CREATININE 1.11*  CALCIUM 9.1   GFR: Estimated Creatinine Clearance: 45.3 mL/min (A) (by C-G formula based on SCr of 1.11 mg/dL (H)). Recent Labs  Lab 04/04/19 1017  WBC 8.3    Liver Function Tests: No results for input(s): AST, ALT, ALKPHOS, BILITOT, PROT, ALBUMIN in the last 168 hours. No results for input(s): LIPASE, AMYLASE in the last 168 hours. No results for input(s): AMMONIA in the last 168 hours.  ABG    Component Value Date/Time   PHART 7.460 (H) 02/22/2019  1440   PCO2ART 51.6 (H) 02/22/2019 1440   PO2ART 68.0 (L) 02/22/2019 1440   HCO3 36.7 (H) 02/22/2019 1440   TCO2 38 (H) 02/22/2019 1440   ACIDBASEDEF 1.0 02/12/2019 1552   O2SAT 82.4 02/27/2019 0404     Coagulation Profile: No results for input(s): INR, PROTIME in the last 168 hours.  Cardiac Enzymes: No results for input(s): CKTOTAL, CKMB, CKMBINDEX, TROPONINI in the last 168 hours.  HbA1C: Hgb A1c MFr Bld  Date/Time Value Ref Range Status  02/04/2019 09:04 AM 10.1 (H) 4.8 - 5.6 % Final    Comment:    (NOTE) Pre diabetes:          5.7%-6.4% Diabetes:              >6.4% Glycemic control for   <7.0% adults with diabetes     CBG: Recent Labs  Lab 04/03/19 1632  04/03/19 1653 04/03/19 2132 04/04/19 0631 04/04/19 1125  GLUCAP 45* 126* 148* 115* 188*   CXR shows mild increase in effusion size.  Assessment & Plan:   Doing generally well and while she does have volume overload, the degree is not sufficient to justify keeping her in hospital. Can discharge on current diuretics. Remove tracheostomy dressing in 5 days.  Can follow-up with PCP as arranged by family.  Lynnell Catalan, MD [facility] ICU Physician [facility]  Pager: [phone] Mobile: [phone] After hours: [phone].  04/04/2019, 1:09 PM

## 2019-04-04 NOTE — Progress Notes (Signed)
Occupational Therapy Discharge Summary  Patient Details  Name: Gabrielle Wright MRN: [id number] Date of Birth: 05-Dec-1945  Today's Date: 04/04/2019 OT Individual Time: 1445-1530 OT Individual Time Calculation (min): 45 min   Family present today for training on supervision of pt during ADLs. Pt educated on RW use, TTB set up in shower, BSC set up over toilet or at end of bed, and rollator brake management. Pt ambulates part way down the hall to practice apartment with family supervising walking. Pt family requires cuing from OT to remind pt to lock w/c brakes prior to sitting even if tired as pt attempts to sit prematurely. Remainder of session family does not require cuing. Pt completes shower transfer with supervision provided by family after OT demonstrates posterior method stepping over ledge as pt family has a glass door. Pt family also educated that [facility] can fit in shower if need smaller option. Other education provided on using sticker strips in shower to decrease chances of slipping. Discussed ideas of energy conservation with family and provided handout for them to review at a later time. Exited session with pt seated in recliner, call light in reach and all needs met   Patient has met 10 of 10 long term goals due to improved activity tolerance, improved balance, postural control, ability to compensate for deficits, improved attention, improved awareness and improved coordination.  Patient to discharge at overall Supervision level.  Patient's care partner is independent to provide the necessary cognitive assistance at discharge.    Reasons goals not met: n/a  Recommendation:  Patient will benefit from ongoing skilled OT services in home health setting to continue to advance functional skills in the area of BADL and iADL.  Equipment: No equipment provided  Reasons for discharge: treatment goals met  Patient/family agrees with progress made and goals achieved: Yes  OT  Discharge Precautions/Restrictions  Precautions Precautions: Fall Precaution Comments: trach removed on 5/7 Restrictions Weight Bearing Restrictions: No General   Vital Signs Therapy Vitals Temp: 98.3 F (36.8 C) Temp Source: Oral Pulse Rate: 74 Resp: 18 BP: (!) 92/47 Patient Position (if appropriate): Sitting Oxygen Therapy SpO2: 98 % O2 Device: Room Air Pain Pain Assessment Pain Score: 0-No pain ADL ADL Eating: Supervision/safety Grooming: Supervision/safety Upper Body Bathing: Supervision/safety Lower Body Bathing: Supervision/safety Upper Body Dressing: Supervision/safety Lower Body Dressing: Supervision/safety Toileting: Supervision/safety Toilet Transfer: Close supervision Gaffer Transfer: Close supervision Vision Baseline Vision/History: Wears glasses;Cataracts Wears Glasses: At all times Vision Assessment?: No apparent visual deficits Perception  Perception: Within Functional Limits Praxis Praxis: Intact Cognition Overall Cognitive Status: Within Functional Limits for tasks assessed Arousal/Alertness: Awake/alert Orientation Level: Oriented X4 Attention: Selective;Sustained Focused Attention: Appears intact Sustained Attention: Appears intact Memory: Impaired Memory Impairment: Decreased recall of new information Awareness: Impaired Awareness Impairment: Anticipatory impairment Behaviors: Perseveration Safety/Judgment: Impaired Sensation Sensation Light Touch: Not tested Proprioception: Appears Intact Coordination Gross Motor Movements are Fluid and Coordinated: Yes Fine Motor Movements are Fluid and Coordinated: Yes Motor  Motor Motor: Abnormal postural alignment and control Motor - Skilled Clinical Observations: generalized deconditioning Motor - Discharge Observations: generalized deconditioning Mobility  Bed Mobility Sit to Supine: Supervision/Verbal cueing Transfers Sit to Stand: Independent with assistive device Stand to  Sit: Independent with assistive device  Trunk/Postural Assessment  Lumbar Assessment Lumbar Assessment: Exceptions to WFL(post pelvic tilt) Postural Control Righting Reactions: delayed Protective Responses: delayed  Balance Balance Balance Assessed: Yes Dynamic Sitting Balance Dynamic Sitting - Level of Assistance: 6: Modified independent (Device/Increase time) Dynamic Standing Balance Dynamic Standing -  Balance Support: No upper extremity supported;During functional activity Dynamic Standing - Level of Assistance: 5: Stand by assistance Extremity/Trunk Assessment RUE Assessment RUE Assessment: Within Functional Limits LUE Assessment LUE Assessment: Within Functional Limits   Tonny Branch 04/04/2019, 4:30 PM

## 2019-04-04 NOTE — Progress Notes (Signed)
Physical Therapy Session Note  Patient Details  Name: Gabrielle [facility] MRN: [id number] Date of Birth: [birth year]  Today's Date: 04/04/2019 PT Individual Time: 0300-9233 PT Individual Time Calculation (min): 72 min   Short Term Goals: Week 1:  PT Short Term Goal 1 (Week 1): STG = LTG due to short ELOS.  Skilled Therapeutic Interventions/Progress Updates:    Patient in recliner in room.  Sit to stand mod I and ambulated with rollator x 50' x 3 with two seated rest breaks on the way to therapy gym.  Patient negotiated 12 steps (6" & 3" steps) with rails and S.  Patient transported to ortho gym in w/c and performed car transfer with S to sedan style car.  Patient ambulated x 100' with rollator and S.  Patient needing min cues at times for positioning RW and locking brakes. Lots of time given for rest breaks and pt with questions throughout about follow up needs and home follow up.  Answered and education along the way about not needing O2 at home, [facility] follow up and safety in the home.  Utilized [employer] interpreter throughout.  Patient left in recliner in room with call bell in reach and seat alarm activated.    Therapy Documentation Precautions:  Precautions Precautions: Fall Precaution Comments: trach removed on 5/7 Restrictions Weight Bearing Restrictions: No Pain: Pain Assessment Pain Score: 0-No pain    Therapy/Group: Individual Therapy  Elray Mcgregor  Sheran Lawless, PT 04/04/2019, 5:49 PM

## 2019-04-05 LAB — GLUCOSE, CAPILLARY
Glucose-Capillary: 154 mg/dL — ABNORMAL HIGH (ref 70–99)
Glucose-Capillary: 178 mg/dL — ABNORMAL HIGH (ref 70–99)

## 2019-04-05 MED ORDER — INSULIN PEN NEEDLE 32G X 5 MM MISC: 1.0000 "pen " | Freq: Two times a day (BID) | 11 refills | Status: AC

## 2019-04-05 NOTE — Progress Notes (Addendum)
  Referral received.  Patient will d/c today and has been prescribed insulin pen/Lantus.  Called and spoke with Daughter by phone Jen Mow [place] @ [phone]).  They have meter and strips.  Reviewed signs, symptoms and treatment of hypoglycemia. Due to COVID-19, family unable to come to hospital.  Therefore sent link with detailed instructions on how to use Lantus Solostar pen.  Will f/u to see if there are questions.  Patient is also ordered Home health which will be helpful with insulin/medications.  Also attached written instructions on how to use insulin pen.   MD, please add order for insulin pen needles [phone].   Thanks,  Beryl Meager, RN, BC-ADM Inpatient Diabetes Coordinator Pager [phone] (8a-5p)  11:45 a- called daughter back to ensure that she received insulin pen video.  She did receive.  Briefly reviewed verbally as well.

## 2019-04-05 NOTE — Progress Notes (Signed)
[facility] PHYSICAL MEDICINE & REHABILITATION PROGRESS NOTE   Subjective/Complaints: Sleeping with head on tray table. No new issues. sats in 90's all night.   ROS: limited due to language/communication    Objective:   Dg Chest 2 View  Result Date: 04/04/2019 CLINICAL DATA:  SOB. Respiratory failure. EXAM: CHEST - 2 VIEW COMPARISON:  03/26/2019. FINDINGS: Tracheostomy has been removed. The heart is enlarged. There are BILATERAL effusions, and bibasilar atelectasis. There is no definite consolidation or edema. Calcified thoracic aorta. IMPRESSION: Cardiomegaly with BILATERAL effusions and bibasilar atelectasis. Electronically Signed   By: Elsie Stain M.D.   On: 04/04/2019 10:47   Recent Labs    04/04/19 1017  WBC 8.3  HGB 9.0*  HCT 30.0*  PLT 317   Recent Labs    04/04/19 1017  NA 137  K 4.6  CL 100  CO2 24  GLUCOSE 245*  BUN 14  CREATININE 1.11*  CALCIUM 9.1    Intake/Output Summary (Last 24 hours) at 04/05/2019 0945 Last data filed at 04/04/2019 1239 Gross per 24 hour  Intake 436 ml  Output -  Net 436 ml     Physical Exam: Vital Signs Blood pressure (!) 122/53, pulse 78, temperature 97.7 F (36.5 C), temperature source Oral, resp. rate 16, height 5' 4.02" (1.626 m), weight 74.5 kg, SpO2 97 %. Constitutional: No distress . Vital signs reviewed. HEENT: EOMI, oral membranes moist Neck: supple, trach stoma closed Cardiovascular: RRR without murmur. No JVD    Respiratory: CTA Bilaterally without wheezes or rales. Normal effort    GI: BS +, non-tender, non-distended  Musc: tr to 1+ edema bilateral ankles. Neuro: Alert Motor: Grossly 4-/5 throughout Psych: a little anxious   Assessment/Plan:  1. Functional deficits secondary to debility after sepsis which require 3+ hours per day of interdisciplinary therapy in a comprehensive inpatient rehab setting.  Physiatrist is providing close team supervision and 24 hour management of active medical problems listed  below.  Physiatrist and rehab team continue to assess barriers to discharge/monitor patient progress toward functional and medical goals  Care Tool:  Bathing  Bathing activity did not occur: Refused Body parts bathed by patient: Right arm, Left arm, Chest, Abdomen, Front perineal area, Buttocks, Right upper leg, Left upper leg, Face, Right lower leg, Left lower leg   Body parts bathed by helper: Right lower leg, Left lower leg     Bathing assist Assist Level: Supervision/Verbal cueing     Upper Body Dressing/Undressing Upper body dressing   What is the patient wearing?: Pull over shirt    Upper body assist Assist Level: Supervision/Verbal cueing    Lower Body Dressing/Undressing Lower body dressing      What is the patient wearing?: Pants     Lower body assist Assist for lower body dressing: Supervision/Verbal cueing     Toileting Toileting    Toileting assist Assist for toileting: Supervision/Verbal cueing     Transfers Chair/bed transfer  Transfers assist     Chair/bed transfer assist level: Independent with assistive device Chair/bed transfer assistive device: Arboriculturist assist      Assist level: Supervision/Verbal cueing Assistive device: Rollator Max distance: 100   Walk 10 feet activity   Assist     Assist level: Supervision/Verbal cueing Assistive device: Rollator   Walk 50 feet activity   Assist Walk 50 feet with 2 turns activity did not occur: Safety/medical concerns  Assist level: Supervision/Verbal cueing Assistive device: Rollator    Walk  150 feet activity   Assist Walk 150 feet activity did not occur: Safety/medical concerns  Assist level: Supervision/Verbal cueing Assistive device: Rollator    Walk 10 feet on uneven surface  activity   Assist Walk 10 feet on uneven surfaces activity did not occur: Safety/medical concerns   Assist level: Supervision/Verbal cueing(ramp) Assistive  device: Other (comment)(rails)   Wheelchair     Assist Will patient use wheelchair at discharge?: No Type of Wheelchair: Manual    Wheelchair assist level: Moderate Assistance - Patient 50 - 74% Max wheelchair distance: 30 ft     Wheelchair 50 feet with 2 turns activity    Assist    Wheelchair 50 feet with 2 turns activity did not occur: Safety/medical concerns       Wheelchair 150 feet activity     Assist Wheelchair 150 feet activity did not occur: Safety/medical concerns        Medical Problem List and Plan: 1.Debilitysecondary to septic shock/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 Asked them to contact pulmonary/ccm if any issues with breathing. Our office will check in on monday 2. Antithrombotics: -DVT/anticoagulation:SCDs -antiplatelet therapy: Aspirin 81 mg daily 3. Pain Management:Tylenol as needed 4. Mood:Zoloft 50 mg daily, trazodone 50 mg nightly, Ativan 0.5 mg as needed   Scheduled xanax added which seems to have helped greatly. May continue at home -antipsychotic agents: N/A    5. Neuropsych: This patientiscapable of making decisions onherown behalf. 6. Skin/Wound Care:Routine skin checks 7. Fluids/Electrolytes/Nutrition:encourage PO 8.Respiratory failure. 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 Continue aspirin. Follow-up cardiology services 10.Acute on chronic combined congestive heart failure. Continue Lasix 40 mg daily.   -continue lasix  -weights appear stable   -appreciate CCM follow up Filed Weights   04/01/19 0521 04/02/19 0500 04/04/19 1300  Weight: 77.1 kg 74.1 kg 74.5 kg    11.Atrial fibrillation with RVR. Amiodarone 200 mg daily.  Cardiac rate controlled at present 12.Hypertension. Coreg 3.125 mg twice daily, hydralazine 10 mg twice daily, Imdur 30 mg daily, Cozaar 25 mg daily, Aldactone 12.5 mg daily  Controlled 5/9 13. Dysphagia. currently on D3 nectars  Continue to advance as tolerated 14. CKD stage III.   Creatinine 1.10 on 5/1---recheck today  Continue to monitor 15. Acute on chronic anemia.   Hemoglobin 9.3 on 5/1  Continue to monitor 16. Diabetes mellitus. Hemoglobin A1c 10.1. Lantus insulin 15 units nightly.    changed lantus to 5u am and 15u pm 5/6   -improved control 5/9---may need outpt adjustments 17. Hyperlipidemia. Lipitor   LOS: 9 days A FACE TO FACE EVALUATION WAS PERFORMED  Ranelle Oyster 04/05/2019, 9:45 AM

## 2019-04-10 ENCOUNTER — Telehealth: Payer: Self-pay

## 2019-04-10 NOTE — Telephone Encounter (Signed)
[place], OT from [facility] called requesting verbal orders for HHOT 1wk4. Orders approved and given per discharge summary.

## 2019-04-16 ENCOUNTER — Telehealth: Payer: Self-pay | Admitting: Cardiology

## 2019-04-16 NOTE — Telephone Encounter (Signed)
Called patient and LVM to call back to schedule an appointment with Dr. Antoine Poche.  I have referral.

## 2019-04-22 ENCOUNTER — Telehealth: Payer: Self-pay | Admitting: Cardiology

## 2019-04-22 NOTE — Telephone Encounter (Signed)
video/smartphone, pre-reg complete, mychart pending, verbal consent given 04/22/2019 MS

## 2019-04-24 ENCOUNTER — Telehealth: Payer: Medicaid Other | Admitting: Cardiology

## 2019-05-02 ENCOUNTER — Telehealth: Payer: Self-pay

## 2019-05-02 NOTE — Telephone Encounter (Signed)
Spoke with pt daughter and she stated her mom was having trouble sleeping for over 1 month.  Having trouble lying down and breathing.the patient gets SOB.  Wanted to make an appointment with Dr Molli Knock.  Inform daughter that Dr Molli Knock does not see pt outside of the hospital and would be happy to make an appointment with one of our doctors as pt has not been seen in office yet.  Daughter did not want to make an appointment.  I instructed her to speak with PCP for referral to "lung doctor".  Nothing further is needed.

## 2020-01-06 IMAGING — DX ABDOMEN - 1 VIEW
1 series · 2 of 2 positions shown · non-contrast
Comparison: Abdominal x-ray dated February 10, 2019.

CLINICAL DATA: Abdominal distention.

EXAM:
ABDOMEN - 1 VIEW

[Series 1: abdomen · 0.14mm/px · 2 of 2 slices shown]
[im 1/2]
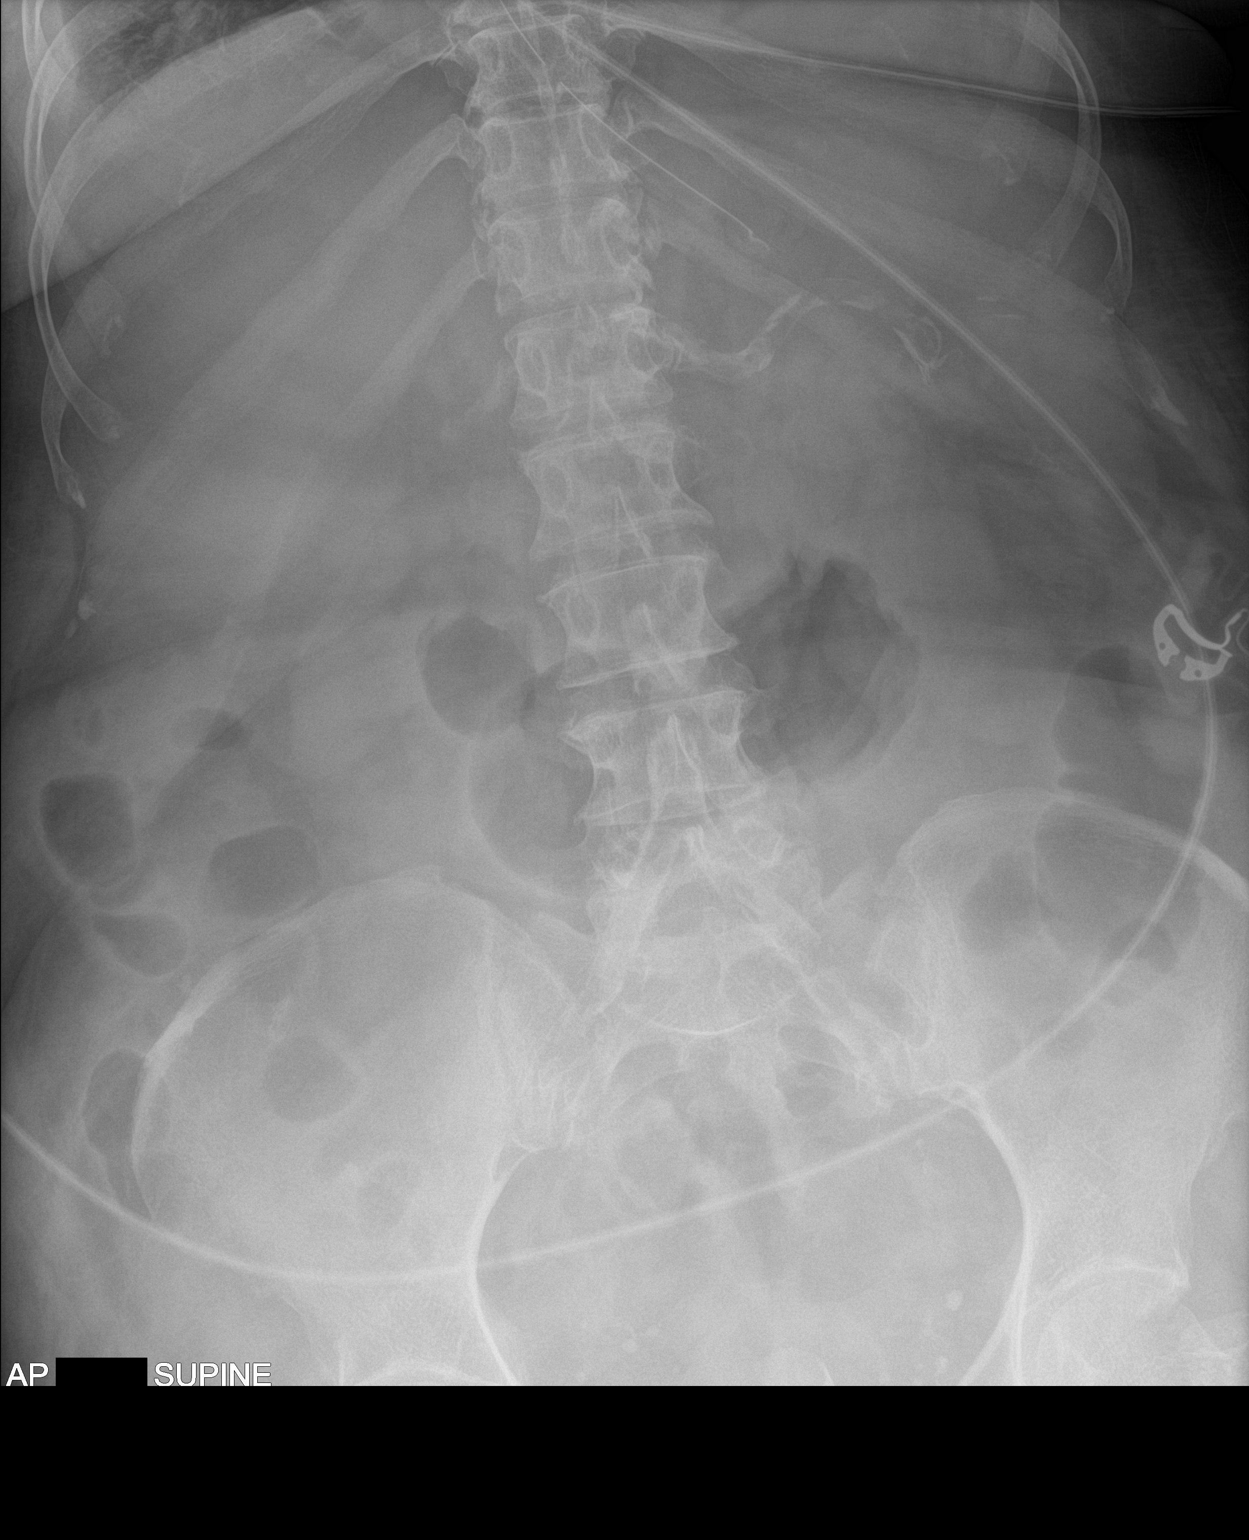
[im 2/2]
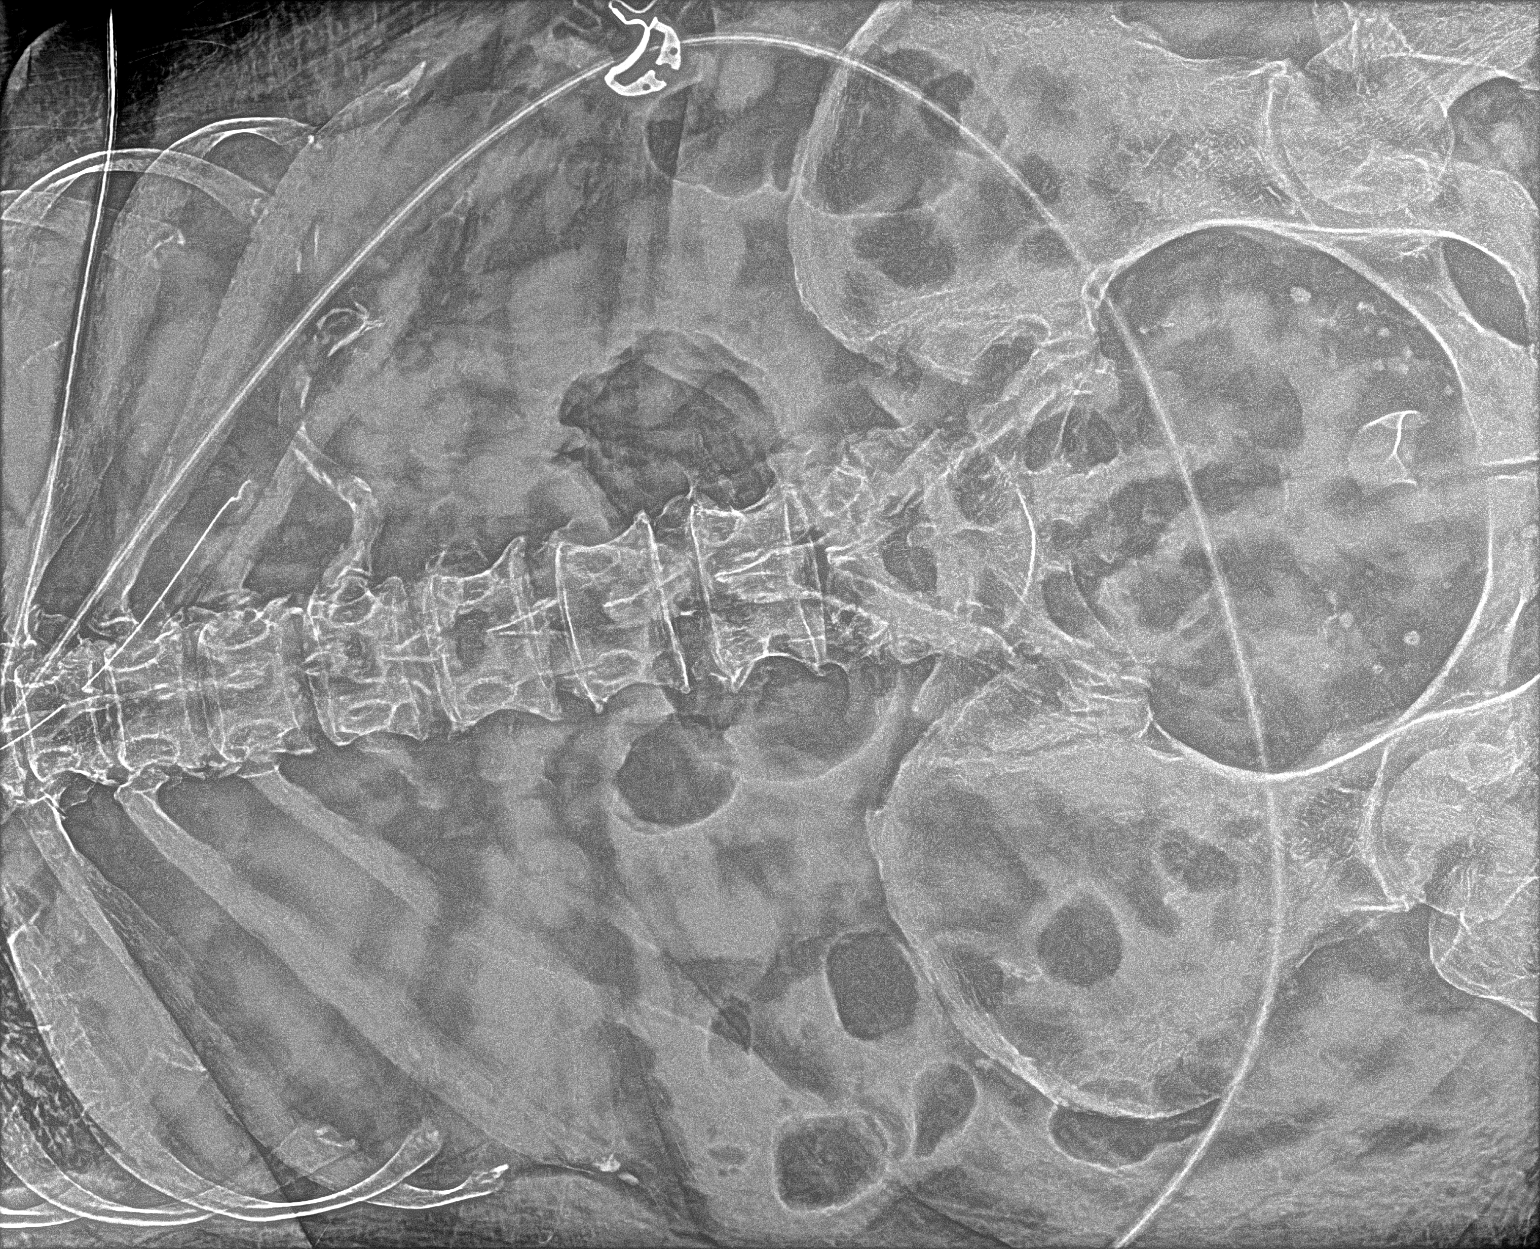

[2 of 2 positions shown; findings below may reference images not displayed]

FINDINGS: Unchanged enteric tube in the stomach. Normal bowel gas pattern. No
acute osseous abnormality.
IMPRESSION: Negative.

## 2020-01-09 IMAGING — DX PORTABLE CHEST - 1 VIEW
1 series · 1 of 1 positions shown · non-contrast
Comparison: Radiograph of same day.

CLINICAL DATA: Endotracheal tube.

EXAM:
PORTABLE CHEST 1 VIEW

[chest]
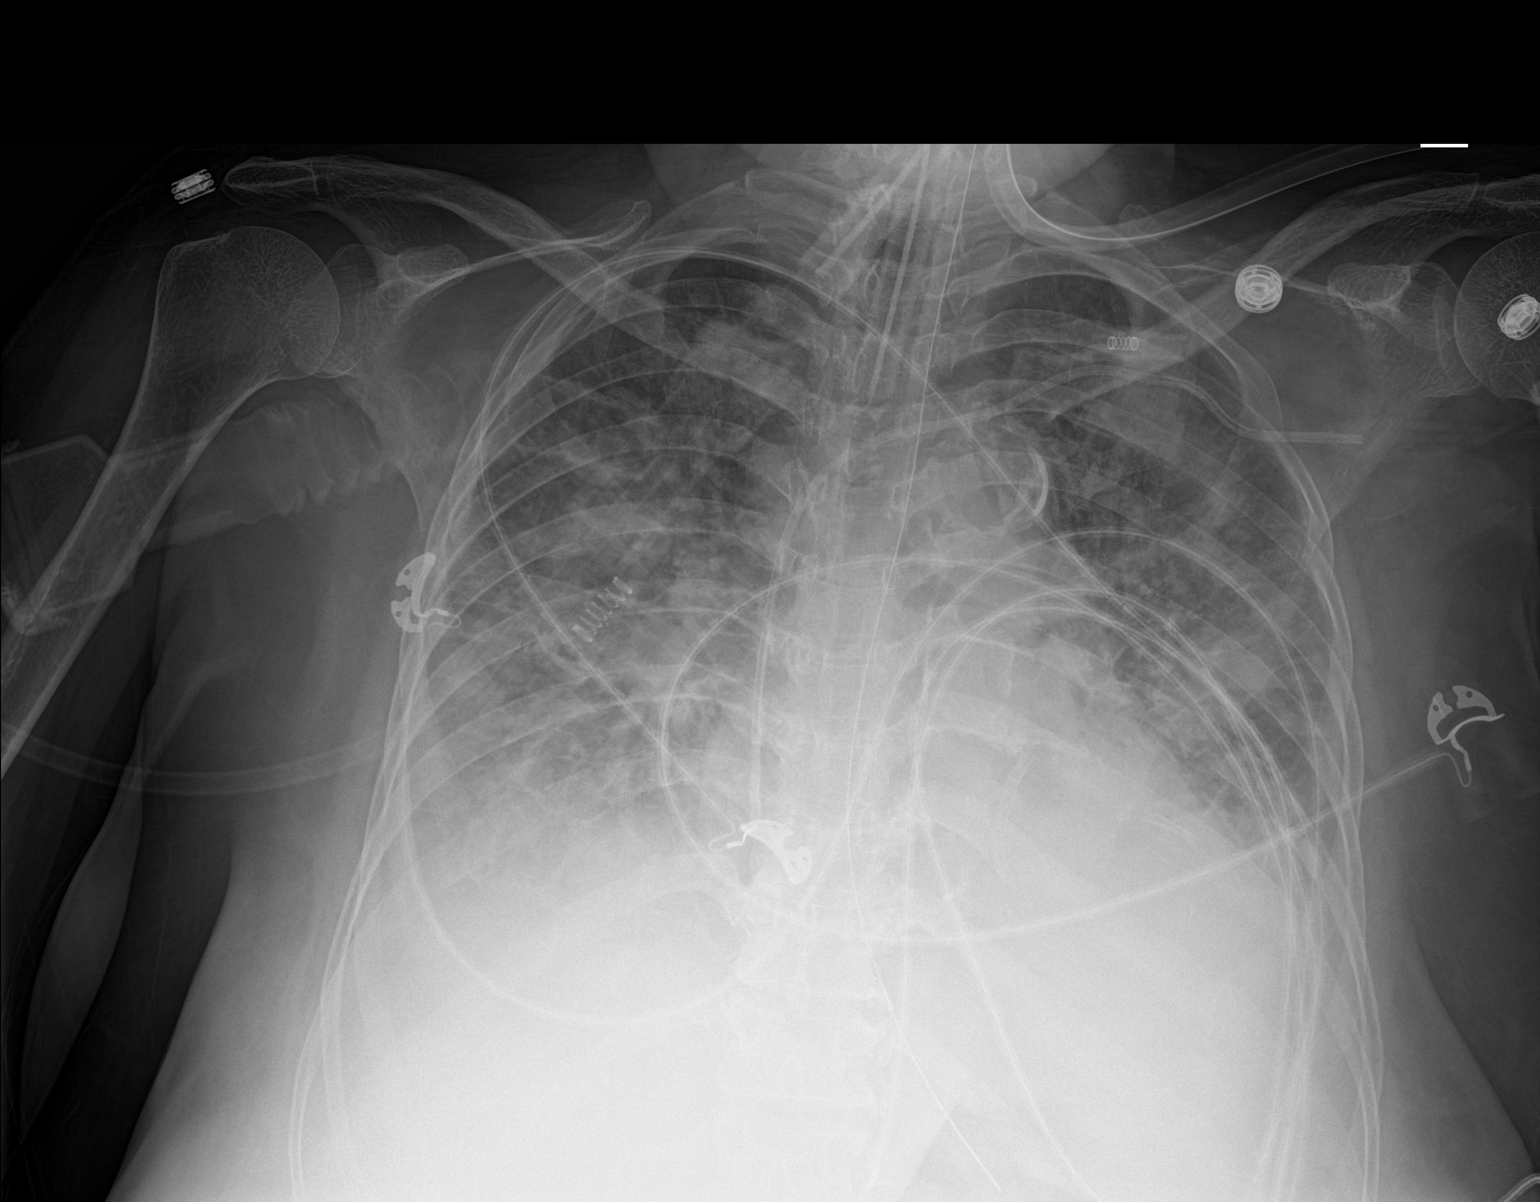

[1 of 1 positions shown; findings below may reference images not displayed]

FINDINGS: Stable cardiomediastinal silhouette. Atherosclerosis of thoracic
aorta is noted. Endotracheal and nasogastric tubes are unchanged in
position. Left subclavian catheter is unchanged in position. Stable
diffuse bilateral lung opacities are noted most consistent with
pulmonary edema and associated pleural effusions. No pneumothorax is
noted. Bony thorax is unremarkable.
IMPRESSION: Stable support apparatus. Stable bilateral lung opacities are noted
most consistent with pulmonary edema and associated pleural
effusions.

## 2020-01-09 IMAGING — DX PORTABLE CHEST - 1 VIEW
1 series · 1 of 1 positions shown · non-contrast
Comparison: Prior radiograph from 02/21/2019.

CLINICAL DATA: Initial evaluation for endotracheal tube placement.

EXAM:
PORTABLE CHEST 1 VIEW

[chest ap]
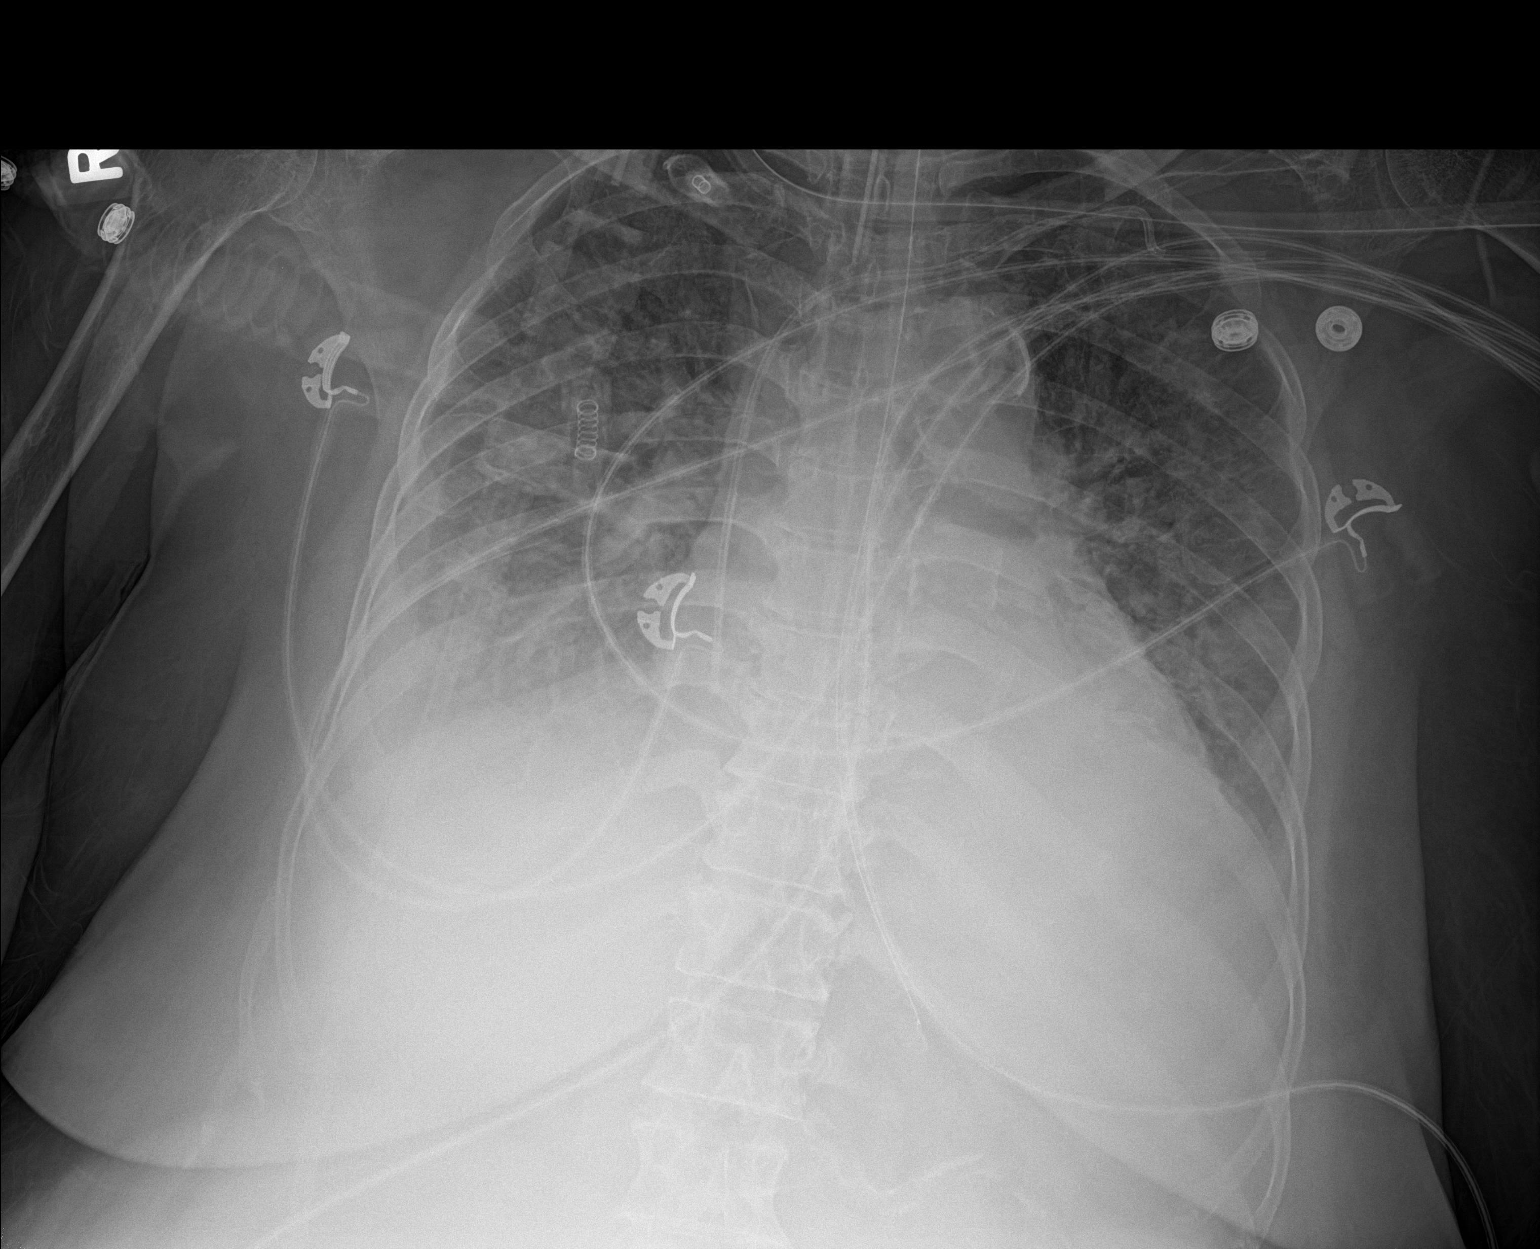

[1 of 1 positions shown; findings below may reference images not displayed]

FINDINGS: Endotracheal tube in place with tip positioned 4.3 cm above the
carina. Enteric tube in place with tip overlying the proximal
stomach, side hole in the distal esophagus. Left subclavian [place]
approach central venous catheter in stable position with tip
overlying the right atrium. Stable cardiomegaly. Mediastinal
silhouette normal. Aortic atherosclerosis.

Lungs are hypoinflated. Small right greater than left pleural
effusions. Persistent moderate diffuse pulmonary edema, improved
from previous. Associated dense bibasilar opacities favored to
reflect atelectasis, although infiltrates may be present as well. No
pneumothorax.

Osseous structures unchanged.
IMPRESSION: 1. Tip of endotracheal tube position 4.3 cm above the carina.
2. Enteric tube in place, tip in the proximal stomach, side hole in
the distal esophagus.
3. Moderate diffuse pulmonary edema with small to moderate right
greater than left pleural effusions, improved relative to previous.
Superimposed bibasilar opacities could reflect atelectasis or
infiltrates.

## 2020-01-10 IMAGING — DX PORTABLE CHEST - 1 VIEW
1 series · 1 of 1 positions shown · non-contrast
Comparison: Radiograph February 22, 2019.

CLINICAL DATA: Respiratory failure.

EXAM:
PORTABLE CHEST 1 VIEW

[chest]
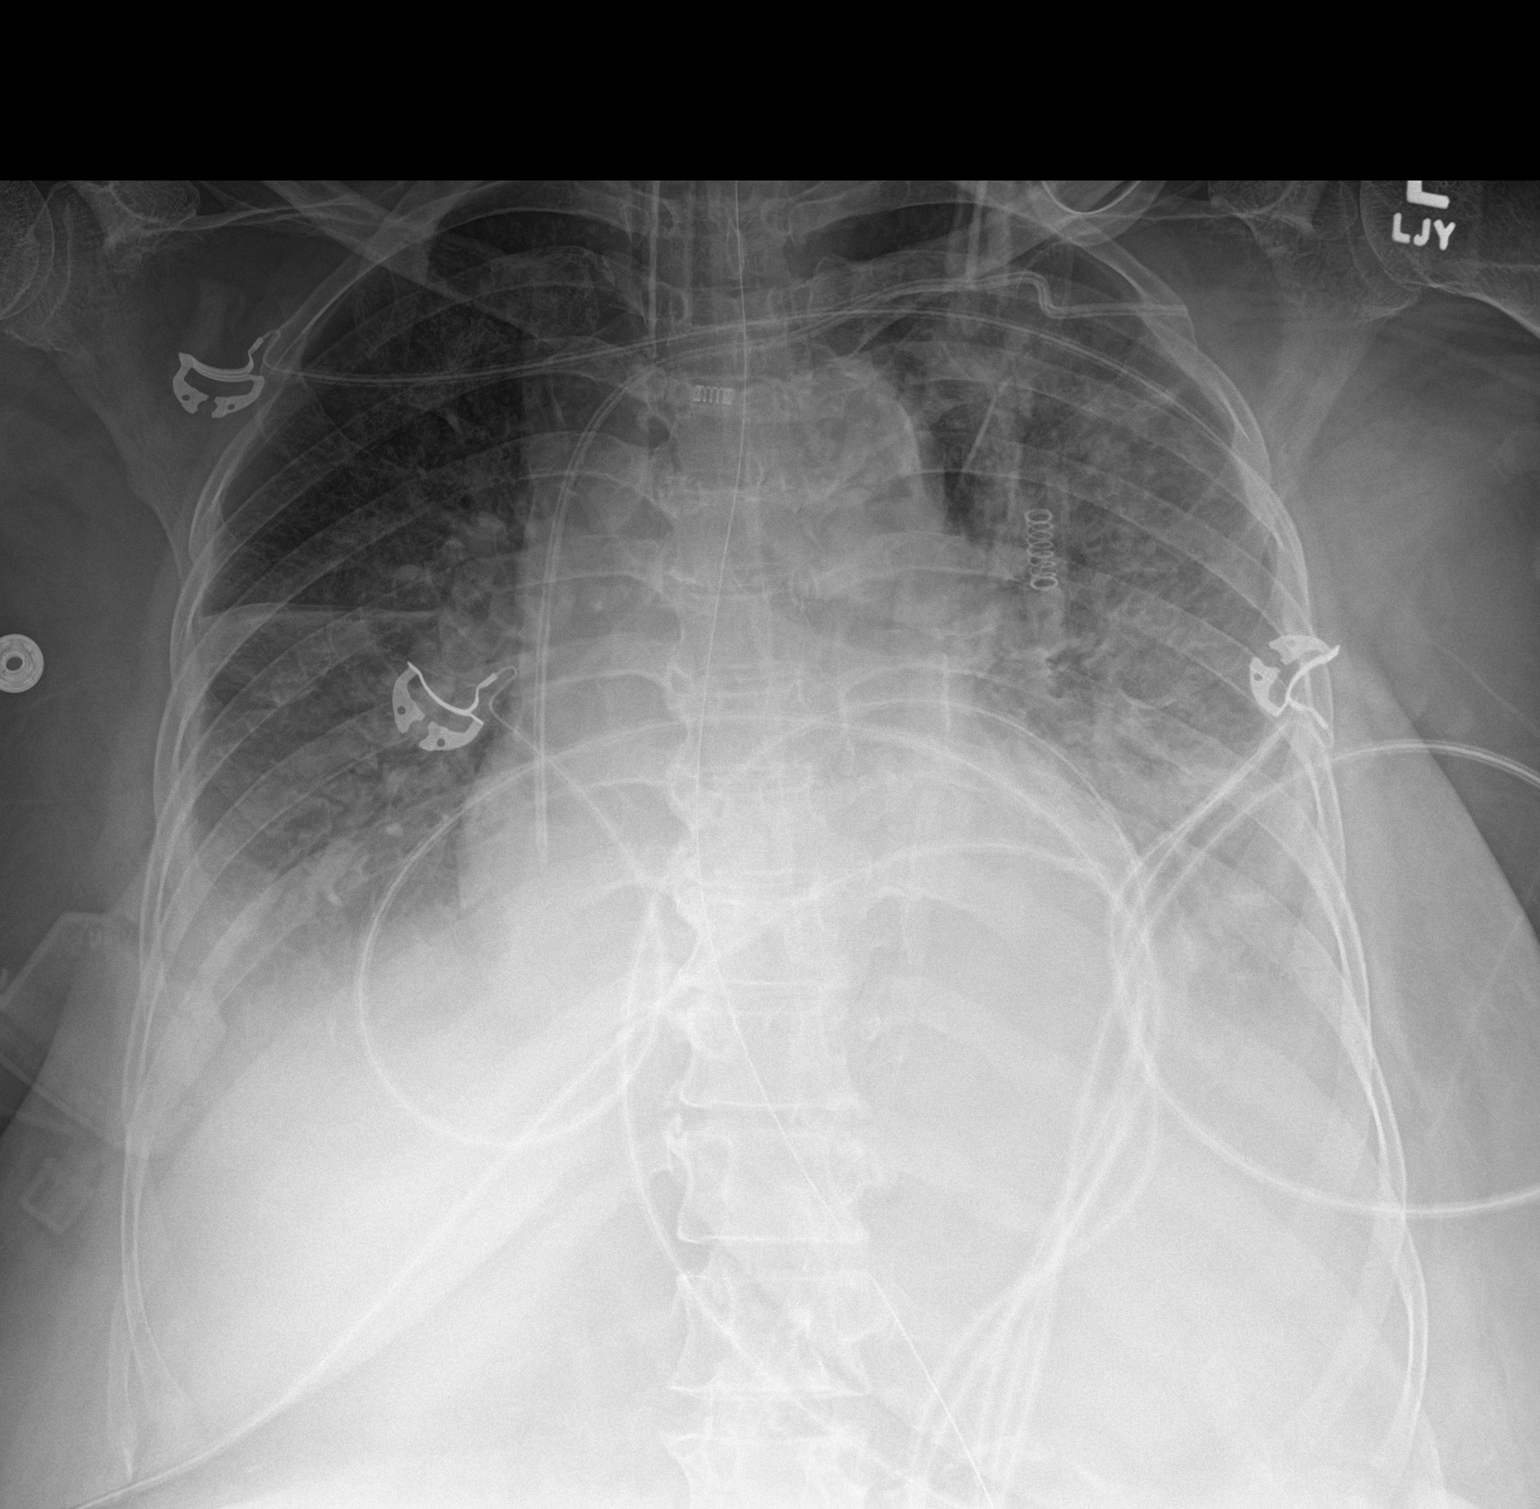

[1 of 1 positions shown; findings below may reference images not displayed]

FINDINGS: Stable cardiomegaly. Endotracheal nasogastric tubes are unchanged in
position. Left subclavian catheter is unchanged in position. No
pneumothorax is noted. Stable bilateral lung opacities are noted
most consistent with pulmonary edema. Small pleural effusions are
noted. Atherosclerosis of thoracic aorta is noted. Bony thorax is
unremarkable.
IMPRESSION: Stable support apparatus. Stable bilateral lung opacities are noted
most consistent with pulmonary edema with associated small pleural
effusions.

Aortic Atherosclerosis ([id number]).

## 2020-01-11 IMAGING — DX PORTABLE CHEST - 1 VIEW
1 series · 1 of 1 positions shown · non-contrast
Comparison: 02/24/2019

CLINICAL DATA: Intubation

EXAM:
PORTABLE CHEST 1 VIEW

[chest ap]
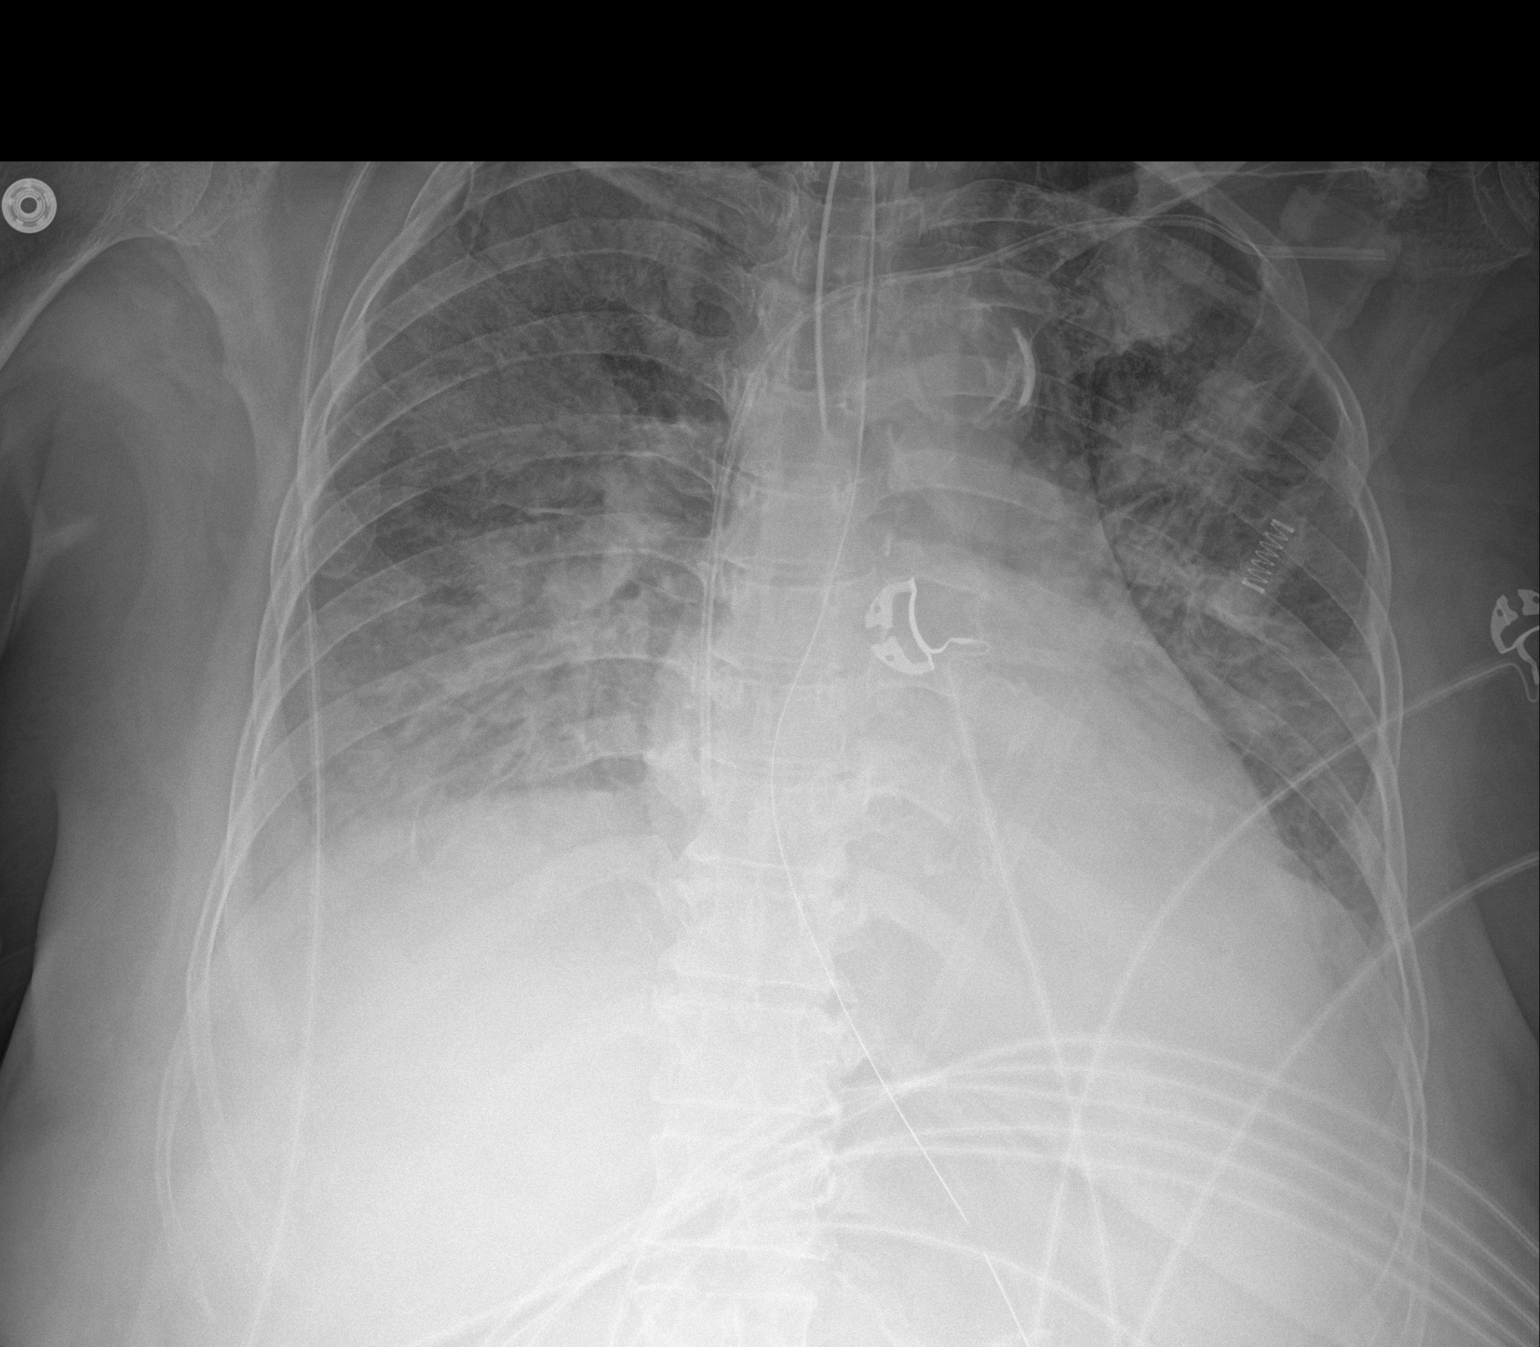

[1 of 1 positions shown; findings below may reference images not displayed]

FINDINGS: Endotracheal tube tip is 2 cm above the inferior margin of the
carina. Left subclavian approach central venous catheter tip
projects over the right atrium. There is left basilar atelectasis in
bilateral diffuse airspace opacity.
IMPRESSION: 1. Endotracheal tube tip 2 cm above the inferior margin of the
carina. Retraction of 3 cm would place it at the level of the
clavicular heads.
2. Unchanged diffuse airspace disease.

## 2020-09-09 ENCOUNTER — Other Ambulatory Visit: Payer: Self-pay | Admitting: Physical Medicine & Rehabilitation
